# Patient Record
Sex: Female | Born: 1945 | Race: White | Hispanic: No | Marital: Married | Smoking: Former smoker
Health system: Southern US, Community
[De-identification: ages and names within clinical notes are randomized; demographics above are authoritative.]

## PROBLEM LIST (undated history)

## (undated) DIAGNOSIS — E669 Obesity, unspecified: Secondary | ICD-10-CM

## (undated) DIAGNOSIS — M810 Age-related osteoporosis without current pathological fracture: Secondary | ICD-10-CM

## (undated) DIAGNOSIS — J441 Chronic obstructive pulmonary disease with (acute) exacerbation: Secondary | ICD-10-CM

## (undated) DIAGNOSIS — C3490 Malignant neoplasm of unspecified part of unspecified bronchus or lung: Secondary | ICD-10-CM

## (undated) DIAGNOSIS — K219 Gastro-esophageal reflux disease without esophagitis: Secondary | ICD-10-CM

## (undated) DIAGNOSIS — Z87442 Personal history of urinary calculi: Secondary | ICD-10-CM

## (undated) DIAGNOSIS — F32A Depression, unspecified: Secondary | ICD-10-CM

## (undated) DIAGNOSIS — M1990 Unspecified osteoarthritis, unspecified site: Secondary | ICD-10-CM

## (undated) DIAGNOSIS — J189 Pneumonia, unspecified organism: Secondary | ICD-10-CM

## (undated) DIAGNOSIS — E785 Hyperlipidemia, unspecified: Secondary | ICD-10-CM

## (undated) DIAGNOSIS — I10 Essential (primary) hypertension: Secondary | ICD-10-CM

## (undated) DIAGNOSIS — J449 Chronic obstructive pulmonary disease, unspecified: Secondary | ICD-10-CM

## (undated) DIAGNOSIS — F329 Major depressive disorder, single episode, unspecified: Secondary | ICD-10-CM

## (undated) DIAGNOSIS — Z801 Family history of malignant neoplasm of trachea, bronchus and lung: Secondary | ICD-10-CM

## (undated) DIAGNOSIS — Z800 Family history of malignant neoplasm of digestive organs: Secondary | ICD-10-CM

## (undated) DIAGNOSIS — Z923 Personal history of irradiation: Secondary | ICD-10-CM

## (undated) DIAGNOSIS — Z720 Tobacco use: Secondary | ICD-10-CM

## (undated) DIAGNOSIS — Z8051 Family history of malignant neoplasm of kidney: Secondary | ICD-10-CM

## (undated) DIAGNOSIS — F419 Anxiety disorder, unspecified: Secondary | ICD-10-CM

## (undated) DIAGNOSIS — Z803 Family history of malignant neoplasm of breast: Secondary | ICD-10-CM

## (undated) DIAGNOSIS — C50919 Malignant neoplasm of unspecified site of unspecified female breast: Secondary | ICD-10-CM

## (undated) DIAGNOSIS — J439 Emphysema, unspecified: Secondary | ICD-10-CM

## (undated) DIAGNOSIS — G4700 Insomnia, unspecified: Secondary | ICD-10-CM

## (undated) DIAGNOSIS — J45909 Unspecified asthma, uncomplicated: Secondary | ICD-10-CM

## (undated) DIAGNOSIS — G5622 Lesion of ulnar nerve, left upper limb: Secondary | ICD-10-CM

## (undated) HISTORY — DX: Tobacco use: Z72.0

## (undated) HISTORY — DX: Age-related osteoporosis without current pathological fracture: M81.0

## (undated) HISTORY — DX: Emphysema, unspecified: J43.9

## (undated) HISTORY — DX: Gastro-esophageal reflux disease without esophagitis: K21.9

## (undated) HISTORY — DX: Depression, unspecified: F32.A

## (undated) HISTORY — DX: Unspecified osteoarthritis, unspecified site: M19.90

## (undated) HISTORY — DX: Lesion of ulnar nerve, left upper limb: G56.22

## (undated) HISTORY — PX: BREAST SURGERY: SHX581

## (undated) HISTORY — DX: Family history of malignant neoplasm of kidney: Z80.51

## (undated) HISTORY — DX: Major depressive disorder, single episode, unspecified: F32.9

## (undated) HISTORY — DX: Family history of malignant neoplasm of breast: Z80.3

## (undated) HISTORY — DX: Anxiety disorder, unspecified: F41.9

## (undated) HISTORY — DX: Family history of malignant neoplasm of digestive organs: Z80.0

## (undated) HISTORY — DX: Chronic obstructive pulmonary disease, unspecified: J44.9

## (undated) HISTORY — PX: BREAST LUMPECTOMY: SHX2

## (undated) HISTORY — DX: Hyperlipidemia, unspecified: E78.5

## (undated) HISTORY — DX: Essential (primary) hypertension: I10

## (undated) HISTORY — DX: Insomnia, unspecified: G47.00

## (undated) HISTORY — PX: BLADDER SURGERY: SHX569

## (undated) HISTORY — DX: Family history of malignant neoplasm of trachea, bronchus and lung: Z80.1

## (undated) HISTORY — PX: ABDOMINAL HYSTERECTOMY: SHX81

## (undated) HISTORY — PX: CHOLECYSTECTOMY: SHX55

## (undated) HISTORY — DX: Unspecified asthma, uncomplicated: J45.909

## (undated) HISTORY — DX: Malignant neoplasm of unspecified part of unspecified bronchus or lung: C34.90

---

## 2002-02-02 ENCOUNTER — Ambulatory Visit (HOSPITAL_COMMUNITY): Admission: RE | Admit: 2002-02-02 | Discharge: 2002-02-02 | Payer: Self-pay | Admitting: Family Medicine

## 2002-02-02 ENCOUNTER — Encounter: Payer: Self-pay | Admitting: Family Medicine

## 2011-12-31 DIAGNOSIS — Z87440 Personal history of urinary (tract) infections: Secondary | ICD-10-CM | POA: Insufficient documentation

## 2011-12-31 DIAGNOSIS — R319 Hematuria, unspecified: Secondary | ICD-10-CM | POA: Insufficient documentation

## 2011-12-31 DIAGNOSIS — R350 Frequency of micturition: Secondary | ICD-10-CM | POA: Insufficient documentation

## 2011-12-31 DIAGNOSIS — R32 Unspecified urinary incontinence: Secondary | ICD-10-CM | POA: Insufficient documentation

## 2013-07-07 ENCOUNTER — Ambulatory Visit (INDEPENDENT_AMBULATORY_CARE_PROVIDER_SITE_OTHER): Payer: No Typology Code available for payment source | Admitting: Psychology

## 2013-07-07 DIAGNOSIS — F333 Major depressive disorder, recurrent, severe with psychotic symptoms: Secondary | ICD-10-CM

## 2013-07-22 ENCOUNTER — Ambulatory Visit (HOSPITAL_COMMUNITY): Payer: Self-pay | Admitting: Psychology

## 2013-08-24 ENCOUNTER — Encounter (HOSPITAL_COMMUNITY): Payer: Self-pay | Admitting: Psychology

## 2013-08-24 NOTE — Progress Notes (Signed)
Patient:  Laura Abbott   DOB: Oct 30, 1946  MR Number: [id number]  Location: [facility] [address] [place] [address] Dept: [phone]  Start: 3 PM End: 4 PM  Provider/Observer:     Hershal Coria PSYD  Chief Complaint:      Chief Complaint  Patient presents with  . Depression  . Other    Suicidal ideation    Reason For Service:     The patient was referred by her primary care physician because of significant depression and suicidal ideation. The patient describes moderate to significant symptoms of depression, anxiety, mood disturbance, appetite changes, sleep disturbance, loss of interest. She reports her major stressors have to do marital distress. She does report that she has dealt with depression all of her life and remembers being very shy and reserved and depression she was a child. She never remembers being happy. The patient reports that she has had a few depressive events are severe in the past. The patient does report that she is at times of suicidal ideation and has had some recently but does contract for safety. The patient reports that she is in a "loveless marriage". The patient reports a 15 years ago her husband told her that he never loved her. The patient reports that she left her husband but that he has killed these feelings over the years. The patient reports that her husband is telling her that he wants a divorce now that the patient does not only was the house have to start her life over again.   Interventions Strategy:   cognitive/behavioral psychotherapy  Participation Level:   Active  Participation Quality:  Appropriate      Behavioral Observation:  Well Groomed, Alert, and Appropriate.   Current Psychosocial Factors:  the patient reports that there are a lot of stress right now have to do with her husband fact that there is little to no marital relations of any kind  between the 2 of them. The patient reports her husband is telling her he wants a divorce but the thought of this is overwhelming to her.   Content of Session:    review current symptoms and continued work on therapeutic interventions.   Current Status:   the patient reports significant depression and has experienced suicidal ideation in the past but contracts for safety and does understand what to do if she develops significant suicidal ideation.   Patient Progress:    stable   Target Goals:    target goals include building and improving overall coping skills particularly around how to deal with major life events such as what is going through right now between the patient and her husband.   Last Reviewed:    07/07/2013   Goals Addressed Today:     today we worked on Pharmacologist and strategies around her coping abilities.   Impression/Diagnosis:    the patient acknowledges a long history with mild to moderate depressive events. However, more recently she was informed by her husband that he did not love her and has never actually loved her. The patient reports he is told her he wants a divorce which is than a major stressor for her. She reports the development of a significant depressive event.   Diagnosis:    Axis I: Major depressive disorder, recurrent episode, severe, specified as with psychotic behavior

## 2013-09-22 ENCOUNTER — Other Ambulatory Visit: Payer: Self-pay | Admitting: Internal Medicine

## 2013-09-22 DIAGNOSIS — R921 Mammographic calcification found on diagnostic imaging of breast: Secondary | ICD-10-CM

## 2013-09-28 ENCOUNTER — Ambulatory Visit
Admission: RE | Admit: 2013-09-28 | Discharge: 2013-09-28 | Disposition: A | Discharge status: Home / Self Care | Payer: No Typology Code available for payment source | Source: Ambulatory Visit | Attending: Internal Medicine | Admitting: Internal Medicine

## 2013-09-28 DIAGNOSIS — R921 Mammographic calcification found on diagnostic imaging of breast: Secondary | ICD-10-CM

## 2013-10-26 ENCOUNTER — Encounter (INDEPENDENT_AMBULATORY_CARE_PROVIDER_SITE_OTHER): Payer: Self-pay | Admitting: Surgery

## 2013-10-28 ENCOUNTER — Ambulatory Visit (INDEPENDENT_AMBULATORY_CARE_PROVIDER_SITE_OTHER): Payer: Medicare Other | Admitting: Surgery

## 2013-10-28 ENCOUNTER — Ambulatory Visit (INDEPENDENT_AMBULATORY_CARE_PROVIDER_SITE_OTHER): Payer: Self-pay | Admitting: General Surgery

## 2013-10-28 ENCOUNTER — Encounter (INDEPENDENT_AMBULATORY_CARE_PROVIDER_SITE_OTHER): Payer: Self-pay | Admitting: Surgery

## 2013-10-28 VITALS — BP 160/90 | HR 68 | Temp 98.1°F | Resp 14 | Ht 63.5 in | Wt 187.8 lb

## 2013-10-28 DIAGNOSIS — D0590 Unspecified type of carcinoma in situ of unspecified breast: Secondary | ICD-10-CM

## 2013-10-28 DIAGNOSIS — D0510 Intraductal carcinoma in situ of unspecified breast: Secondary | ICD-10-CM | POA: Insufficient documentation

## 2013-10-28 DIAGNOSIS — D0511 Intraductal carcinoma in situ of right breast: Secondary | ICD-10-CM

## 2013-10-28 NOTE — Progress Notes (Signed)
Patient ID: Laura Abbott, female   DOB: 05-Mar-1946, 67 y.o.   MRN: [id number]  Chief Complaint  Patient presents with  . New Evaluation    eval RT br calcification    HPI Laura Abbott is a 67 y.o. female.   HPI This is a pleasant female referred by Dr.Vyas after the recent diagnosis of ductal carcinoma in situ of the right breast. Abnormal calcifications were found in the right breast on recent screening mammography. Biopsy confirmed DCIS. She has had  A previous right breast lumpectomy many years ago for a benign lesion. She has had no other problems regarding her breast.  She denies nipple discharge. She has no other complaints. Past Medical History  Diagnosis Date  . Anxiety   . GERD (gastroesophageal reflux disease)   . Hyperlipidemia   . Depression   . Arthritis   . Asthma   . Cancer     breast  . COPD (chronic obstructive pulmonary disease)   . Emphysema of lung   . Hypertension   . Osteoporosis     Past Surgical History  Procedure Laterality Date  . Cholecystectomy    . Abdominal hysterectomy    . Breast surgery      lumpectomy  . Bladder surgery      had tacked    Family History  Problem Relation Age of Onset  . Cancer Mother     lung  . Cancer Father     pancreatic  . Cancer Sister     leukemia  . Cancer Sister     liver  . Cancer Sister     colon  . Cancer Sister     BREAST    Social History History  Substance Use Topics  . Smoking status: Current Every Day Smoker -- 1.00 packs/day  . Smokeless tobacco: Never Used  . Alcohol Use: No    No Known Allergies  Current Outpatient Prescriptions  Medication Sig Dispense Refill  . albuterol (PROVENTIL HFA;VENTOLIN HFA) 108 (90 BASE) MCG/ACT inhaler Inhale into the lungs every 6 (six) hours as needed for wheezing or shortness of breath.      . ALPRAZolam (XANAX) 0.5 MG tablet Take 0.5 mg by mouth at bedtime as needed for anxiety.      . Calcium 600-200 MG-UNIT per tablet Take 1 tablet by mouth daily.       Marland Kitchen FLUoxetine (PROZAC) 40 MG capsule Take 40 mg by mouth daily.      Marland Kitchen lisinopril (PRINIVIL,ZESTRIL) 10 MG tablet Take 10 mg by mouth daily.      . metoprolol (LOPRESSOR) 50 MG tablet Take 50 mg by mouth 2 (two) times daily.      Marland Kitchen omeprazole (PRILOSEC) 20 MG capsule       . traZODone (DESYREL) 50 MG tablet Take 50 mg by mouth at bedtime.      . VESICARE 5 MG tablet        No current facility-administered medications for this visit.    Review of Systems Review of Systems  Constitutional: Negative for fever, chills and unexpected weight change.  HENT: Negative for congestion, hearing loss, sore throat, trouble swallowing and voice change.   Eyes: Negative for visual disturbance.  Respiratory: Negative for cough and wheezing.   Cardiovascular: Negative for chest pain, palpitations and leg swelling.  Gastrointestinal: Negative for nausea, vomiting, abdominal pain, diarrhea, constipation, blood in stool, abdominal distention and anal bleeding.  Genitourinary: Negative for hematuria, vaginal bleeding and difficulty urinating.  Musculoskeletal:  Negative for arthralgias.  Skin: Negative for rash and wound.  Neurological: Negative for seizures, syncope and headaches.  Hematological: Negative for adenopathy. Does not bruise/bleed easily.  Psychiatric/Behavioral: Negative for confusion.    Blood pressure 160/90, pulse 68, temperature 98.1 F (36.7 C), temperature source Temporal, resp. rate 14, height 5' 3.5" (1.613 m), weight 187 lb 12.8 oz (85.186 kg).  Physical Exam Physical Exam  Constitutional: She is oriented to person, place, and time. She appears well-developed and well-nourished. No distress.  HENT:  Head: Normocephalic and atraumatic.  Right Ear: External ear normal.  Left Ear: External ear normal.  Nose: Nose normal.  Mouth/Throat: Oropharynx is clear and moist. No oropharyngeal exudate.  Eyes: Conjunctivae are normal. Pupils are equal, round, and reactive to light. Right  eye exhibits no discharge. Left eye exhibits no discharge. No scleral icterus.  Neck: Normal range of motion. Neck supple. No tracheal deviation present.  Cardiovascular: Normal rate, regular rhythm, normal heart sounds and intact distal pulses.   No murmur heard. Pulmonary/Chest: Effort normal and breath sounds normal. No respiratory distress. She has no wheezes. She has no rales.  Abdominal: Soft. Bowel sounds are normal. She exhibits no distension. There is no tenderness.  Musculoskeletal: Normal range of motion. She exhibits no edema and no tenderness.  Lymphadenopathy:    She has no cervical adenopathy.    She has no axillary adenopathy.  Neurological: She is alert and oriented to person, place, and time.  Skin: Skin is warm and dry. No rash noted. No erythema.  Psychiatric: Her behavior is normal. Judgment normal.  Breast:  There are no palpable masses in either breast. Areola are normal. There is minimal bruising in the right breast from the previous biopsy  Data Reviewed I have reviewed the patient's mammogram and biopsy results  Assessment    Ductal carcinoma in situ of the right breast     Plan    I discussed the diagnosis with the patient and her daughter in detail. I discussed options including breast conservation versus mastectomy. She would like to proceed with breast conservation. She will thus be scheduled for a needle localized right breast lumpectomy and sentinel lymph node biopsy. I discussed the procedure with her in detail. I discussed the risk of the procedure which include but not limited to bleeding, infection, need for further surgery should the margins or nodes being positive, et Karie Soda. She understands and wished to proceed. Surgery will be scheduled        Jevaughn Degollado A 10/28/2013, 2:10 PM

## 2013-10-29 ENCOUNTER — Other Ambulatory Visit (INDEPENDENT_AMBULATORY_CARE_PROVIDER_SITE_OTHER): Payer: Self-pay | Admitting: Surgery

## 2013-10-29 DIAGNOSIS — D0511 Intraductal carcinoma in situ of right breast: Secondary | ICD-10-CM

## 2013-11-04 ENCOUNTER — Encounter (HOSPITAL_COMMUNITY): Payer: Self-pay

## 2013-11-04 NOTE — Pre-Procedure Instructions (Signed)
KRISTA GODSIL  11/04/2013   Your procedure is scheduled on:  11/09/13  Report to [facility]  2 * 3 at 830 AM.  Call this number if you have problems the morning of surgery: [phone]   Remember:   Do not eat food or drink liquids after midnight.   Take these medicines the morning of surgery with A SIP OF WATER: all,inhalers,prozac,metoprolol,prilosec   Do not wear jewelry, make-up or nail polish.  Do not wear lotions, powders, or perfumes. You may wear deodorant.  Do not shave 48 hours prior to surgery. Men may shave face and neck.  Do not bring valuables to the hospital.  [facility] is not responsible                  for any belongings or valuables.               Contacts, dentures or bridgework may not be worn into surgery.  Leave suitcase in the car. After surgery it may be brought to your room.  For patients admitted to the hospital, discharge time is determined by your                treatment team.               Patients discharged the day of surgery will not be allowed to drive  home.  Name and phone number of your driver: family  Special Instructions: Shower using CHG 2 nights before surgery and the night before surgery.  If you shower the day of surgery use CHG.  Use special wash - you have one bottle of CHG for all showers.  You should use approximately 1/3 of the bottle for each shower.   Please read over the following fact sheets that you were given: Pain Booklet, Coughing and Deep Breathing and Surgical Site Infection Prevention

## 2013-11-05 ENCOUNTER — Encounter (HOSPITAL_COMMUNITY)
Admission: RE | Admit: 2013-11-05 | Discharge: 2013-11-05 | Disposition: A | Discharge status: Home / Self Care | Payer: Medicare Other | Source: Ambulatory Visit | Attending: Surgery | Admitting: Surgery

## 2013-11-05 ENCOUNTER — Encounter (HOSPITAL_COMMUNITY): Payer: Self-pay

## 2013-11-05 VITALS — BP 159/81 | HR 72 | Temp 97.9°F | Resp 18 | Ht 63.0 in | Wt 184.5 lb

## 2013-11-05 DIAGNOSIS — D0511 Intraductal carcinoma in situ of right breast: Secondary | ICD-10-CM

## 2013-11-05 DIAGNOSIS — Z01810 Encounter for preprocedural cardiovascular examination: Secondary | ICD-10-CM | POA: Insufficient documentation

## 2013-11-05 DIAGNOSIS — Z01818 Encounter for other preprocedural examination: Secondary | ICD-10-CM | POA: Insufficient documentation

## 2013-11-05 DIAGNOSIS — Z01812 Encounter for preprocedural laboratory examination: Secondary | ICD-10-CM | POA: Insufficient documentation

## 2013-11-05 LAB — CBC
HCT: 44.8 % (ref 36.0–46.0)
Hemoglobin: 15 g/dL (ref 12.0–15.0)
MCH: 31.2 pg (ref 26.0–34.0)
MCHC: 33.5 g/dL (ref 30.0–36.0)
MCV: 93.1 fL (ref 78.0–100.0)
RDW: 13.3 % (ref 11.5–15.5)

## 2013-11-05 LAB — BASIC METABOLIC PANEL
BUN: 14 mg/dL (ref 6–23)
CO2: 29 mEq/L (ref 19–32)
Creatinine, Ser: 0.52 mg/dL (ref 0.50–1.10)
GFR calc Af Amer: >90 mL/min (ref >90)
GFR calc non Af Amer: >90 mL/min (ref >90)
Glucose, Bld: 105 mg/dL — ABNORMAL HIGH (ref 70–99)
Potassium: 4.1 mEq/L (ref 3.5–5.1)

## 2013-11-08 MED ORDER — CEFAZOLIN SODIUM-DEXTROSE 2-3 GM-% IV SOLR
2.0000 g | INTRAVENOUS | Status: AC
  Administered 2013-11-09: 2 g via INTRAVENOUS
  Filled 2013-11-08: qty 50

## 2013-11-08 NOTE — H&P (Signed)
Chief Complaint   Patient presents with   .  New Evaluation     eval RT br calcification   HPI  Laura Abbott is a 66 y.o. female.  HPI  This is a pleasant female referred by Dr.Vyas after the recent diagnosis of ductal carcinoma in situ of the right breast. Abnormal calcifications were found in the right breast on recent screening mammography. Biopsy confirmed DCIS. She has had A previous right breast lumpectomy many years ago for a benign lesion. She has had no other problems regarding her breast. She denies nipple discharge. She has no other complaints.  Past Medical History   Diagnosis  Date   .  Anxiety    .  GERD (gastroesophageal reflux disease)    .  Hyperlipidemia    .  Depression    .  Arthritis    .  Asthma    .  Cancer      breast   .  COPD (chronic obstructive pulmonary disease)    .  Emphysema of lung    .  Hypertension    .  Osteoporosis     Past Surgical History   Procedure  Laterality  Date   .  Cholecystectomy     .  Abdominal hysterectomy     .  Breast surgery       lumpectomy   .  Bladder surgery       had tacked    Family History   Problem  Relation  Age of Onset   .  Cancer  Mother      lung   .  Cancer  Father      pancreatic   .  Cancer  Sister      leukemia   .  Cancer  Sister      liver   .  Cancer  Sister      colon   .  Cancer  Sister      BREAST   Social History  History   Substance Use Topics   .  Smoking status:  Current Every Day Smoker -- 1.00 packs/day   .  Smokeless tobacco:  Never Used   .  Alcohol Use:  No   No Known Allergies  Current Outpatient Prescriptions   Medication  Sig  Dispense  Refill   .  albuterol (PROVENTIL HFA;VENTOLIN HFA) 108 (90 BASE) MCG/ACT inhaler  Inhale into the lungs every 6 (six) hours as needed for wheezing or shortness of breath.     .  ALPRAZolam (XANAX) 0.5 MG tablet  Take 0.5 mg by mouth at bedtime as needed for anxiety.     .  Calcium 600-200 MG-UNIT per tablet  Take 1 tablet by mouth daily.      Marland Kitchen  FLUoxetine (PROZAC) 40 MG capsule  Take 40 mg by mouth daily.     Marland Kitchen  lisinopril (PRINIVIL,ZESTRIL) 10 MG tablet  Take 10 mg by mouth daily.     .  metoprolol (LOPRESSOR) 50 MG tablet  Take 50 mg by mouth 2 (two) times daily.     Marland Kitchen  omeprazole (PRILOSEC) 20 MG capsule      .  traZODone (DESYREL) 50 MG tablet  Take 50 mg by mouth at bedtime.     .  VESICARE 5 MG tablet       No current facility-administered medications for this visit.   Review of Systems  Review of Systems  Constitutional: Negative for fever, chills and  unexpected weight change.  HENT: Negative for congestion, hearing loss, sore throat, trouble swallowing and voice change.  Eyes: Negative for visual disturbance.  Respiratory: Negative for cough and wheezing.  Cardiovascular: Negative for chest pain, palpitations and leg swelling.  Gastrointestinal: Negative for nausea, vomiting, abdominal pain, diarrhea, constipation, blood in stool, abdominal distention and anal bleeding.  Genitourinary: Negative for hematuria, vaginal bleeding and difficulty urinating.  Musculoskeletal: Negative for arthralgias.  Skin: Negative for rash and wound.  Neurological: Negative for seizures, syncope and headaches.  Hematological: Negative for adenopathy. Does not bruise/bleed easily.  Psychiatric/Behavioral: Negative for confusion.  Blood pressure 160/90, pulse 68, temperature 98.1 F (36.7 C), temperature source Temporal, resp. rate 14, height 5' 3.5" (1.613 m), weight 187 lb 12.8 oz (85.186 kg).  Physical Exam  Physical Exam  Constitutional: She is oriented to person, place, and time. She appears well-developed and well-nourished. No distress.  HENT:  Head: Normocephalic and atraumatic.  Right Ear: External ear normal.  Left Ear: External ear normal.  Nose: Nose normal.  Mouth/Throat: Oropharynx is clear and moist. No oropharyngeal exudate.  Eyes: Conjunctivae are normal. Pupils are equal, round, and reactive to light. Right eye  exhibits no discharge. Left eye exhibits no discharge. No scleral icterus.  Neck: Normal range of motion. Neck supple. No tracheal deviation present.  Cardiovascular: Normal rate, regular rhythm, normal heart sounds and intact distal pulses.  No murmur heard.  Pulmonary/Chest: Effort normal and breath sounds normal. No respiratory distress. She has no wheezes. She has no rales.  Abdominal: Soft. Bowel sounds are normal. She exhibits no distension. There is no tenderness.  Musculoskeletal: Normal range of motion. She exhibits no edema and no tenderness.  Lymphadenopathy:  She has no cervical adenopathy.  She has no axillary adenopathy.  Neurological: She is alert and oriented to person, place, and time.  Skin: Skin is warm and dry. No rash noted. No erythema.  Psychiatric: Her behavior is normal. Judgment normal.  Breast: There are no palpable masses in either breast. Areola are normal. There is minimal bruising in the right breast from the previous biopsy  Data Reviewed  I have reviewed the patient's mammogram and biopsy results  Assessment  Ductal carcinoma in situ of the right breast  Plan  I discussed the diagnosis with the patient and her daughter in detail. I discussed options including breast conservation versus mastectomy. She would like to proceed with breast conservation. She will thus be scheduled for a needle localized right breast lumpectomy and sentinel lymph node biopsy. I discussed the procedure with her in detail. I discussed the risk of the procedure which include but not limited to bleeding, infection, need for further surgery should the margins or nodes being positive, et Karie Soda. She understands and wished to proceed. Surgery will be scheduled

## 2013-11-09 ENCOUNTER — Encounter (HOSPITAL_COMMUNITY): Payer: Medicare Other | Admitting: Certified Registered"

## 2013-11-09 ENCOUNTER — Ambulatory Visit (HOSPITAL_COMMUNITY)
Admission: RE | Admit: 2013-11-09 | Discharge: 2013-11-09 | Disposition: A | Discharge status: Home / Self Care | Payer: Medicare Other | Source: Ambulatory Visit | Attending: Surgery | Admitting: Surgery

## 2013-11-09 ENCOUNTER — Encounter (HOSPITAL_COMMUNITY): Payer: Self-pay | Admitting: Certified Registered"

## 2013-11-09 ENCOUNTER — Ambulatory Visit (HOSPITAL_COMMUNITY): Payer: Medicare Other | Admitting: Certified Registered"

## 2013-11-09 ENCOUNTER — Ambulatory Visit
Admission: RE | Admit: 2013-11-09 | Discharge: 2013-11-09 | Disposition: A | Discharge status: Home / Self Care | Payer: Medicare Other | Source: Ambulatory Visit | Attending: Surgery | Admitting: Surgery

## 2013-11-09 ENCOUNTER — Encounter (HOSPITAL_COMMUNITY)
Admission: RE | Disposition: A | Discharge status: Home / Self Care | Payer: Self-pay | Source: Ambulatory Visit | Attending: Surgery

## 2013-11-09 ENCOUNTER — Encounter (HOSPITAL_COMMUNITY)
Admission: RE | Admit: 2013-11-09 | Discharge: 2013-11-09 | Disposition: A | Discharge status: Home / Self Care | Payer: Medicare Other | Source: Ambulatory Visit | Attending: Surgery | Admitting: Surgery

## 2013-11-09 DIAGNOSIS — C50919 Malignant neoplasm of unspecified site of unspecified female breast: Secondary | ICD-10-CM | POA: Insufficient documentation

## 2013-11-09 DIAGNOSIS — D0511 Intraductal carcinoma in situ of right breast: Secondary | ICD-10-CM

## 2013-11-09 DIAGNOSIS — J4489 Other specified chronic obstructive pulmonary disease: Secondary | ICD-10-CM | POA: Insufficient documentation

## 2013-11-09 DIAGNOSIS — F411 Generalized anxiety disorder: Secondary | ICD-10-CM | POA: Insufficient documentation

## 2013-11-09 DIAGNOSIS — F17200 Nicotine dependence, unspecified, uncomplicated: Secondary | ICD-10-CM | POA: Insufficient documentation

## 2013-11-09 DIAGNOSIS — I10 Essential (primary) hypertension: Secondary | ICD-10-CM | POA: Insufficient documentation

## 2013-11-09 DIAGNOSIS — J449 Chronic obstructive pulmonary disease, unspecified: Secondary | ICD-10-CM | POA: Insufficient documentation

## 2013-11-09 DIAGNOSIS — D0590 Unspecified type of carcinoma in situ of unspecified breast: Secondary | ICD-10-CM | POA: Insufficient documentation

## 2013-11-09 DIAGNOSIS — F3289 Other specified depressive episodes: Secondary | ICD-10-CM | POA: Insufficient documentation

## 2013-11-09 DIAGNOSIS — K219 Gastro-esophageal reflux disease without esophagitis: Secondary | ICD-10-CM | POA: Insufficient documentation

## 2013-11-09 DIAGNOSIS — F329 Major depressive disorder, single episode, unspecified: Secondary | ICD-10-CM | POA: Insufficient documentation

## 2013-11-09 DIAGNOSIS — Z79899 Other long term (current) drug therapy: Secondary | ICD-10-CM | POA: Insufficient documentation

## 2013-11-09 HISTORY — PX: BREAST LUMPECTOMY WITH NEEDLE LOCALIZATION AND AXILLARY SENTINEL LYMPH NODE BX: SHX5760

## 2013-11-09 SURGERY — BREAST LUMPECTOMY WITH NEEDLE LOCALIZATION AND AXILLARY SENTINEL LYMPH NODE BX
Anesthesia: General | Site: Breast | Laterality: Right

## 2013-11-09 MED ORDER — SODIUM CHLORIDE 0.9 % IJ SOLN: 3.0000 mL | Freq: Two times a day (BID) | INTRAMUSCULAR | Status: DC

## 2013-11-09 MED ORDER — TECHNETIUM TC 99M SULFUR COLLOID FILTERED
1.0000 | Freq: Once | INTRAVENOUS | Status: AC | PRN
  Administered 2013-11-09: 1 via INTRADERMAL

## 2013-11-09 MED ORDER — ONDANSETRON HCL 4 MG/2ML IJ SOLN: 4.0000 mg | Freq: Four times a day (QID) | INTRAMUSCULAR | Status: DC | PRN

## 2013-11-09 MED ORDER — ARTIFICIAL TEARS OP OINT
TOPICAL_OINTMENT | OPHTHALMIC | Status: DC | PRN
  Administered 2013-11-09: 1 via OPHTHALMIC

## 2013-11-09 MED ORDER — METHYLENE BLUE 1 % INJ SOLN
INTRAMUSCULAR | Status: AC
  Filled 2013-11-09: qty 10

## 2013-11-09 MED ORDER — ACETAMINOPHEN 650 MG RE SUPP: 650.0000 mg | RECTAL | Status: DC | PRN

## 2013-11-09 MED ORDER — FENTANYL CITRATE 0.05 MG/ML IJ SOLN
INTRAMUSCULAR | Status: AC
  Filled 2013-11-09: qty 2

## 2013-11-09 MED ORDER — ONDANSETRON HCL 4 MG/2ML IJ SOLN: 4.0000 mg | Freq: Once | INTRAMUSCULAR | Status: DC | PRN

## 2013-11-09 MED ORDER — OXYCODONE HCL 5 MG PO TABS: 5.0000 mg | ORAL_TABLET | ORAL | Status: DC | PRN

## 2013-11-09 MED ORDER — LACTATED RINGERS IV SOLN
INTRAVENOUS | Status: DC | PRN
  Administered 2013-11-09: 12:00:00 via INTRAVENOUS

## 2013-11-09 MED ORDER — LIDOCAINE HCL (CARDIAC) 20 MG/ML IV SOLN
INTRAVENOUS | Status: DC | PRN
  Administered 2013-11-09: 80 mg via INTRAVENOUS

## 2013-11-09 MED ORDER — BUPIVACAINE-EPINEPHRINE (PF) 0.25% -1:200000 IJ SOLN
INTRAMUSCULAR | Status: AC
  Filled 2013-11-09: qty 30

## 2013-11-09 MED ORDER — MORPHINE SULFATE 4 MG/ML IJ SOLN: 4.0000 mg | INTRAMUSCULAR | Status: DC | PRN

## 2013-11-09 MED ORDER — FENTANYL CITRATE 0.05 MG/ML IJ SOLN
100.0000 ug | Freq: Once | INTRAMUSCULAR | Status: AC
  Administered 2013-11-09: 100 ug via INTRAVENOUS

## 2013-11-09 MED ORDER — SODIUM CHLORIDE 0.9 % IV SOLN: 250.0000 mL | INTRAVENOUS | Status: DC | PRN

## 2013-11-09 MED ORDER — OXYCODONE-ACETAMINOPHEN 5-325 MG PO TABS: 1.0000 | ORAL_TABLET | ORAL | Status: DC | PRN

## 2013-11-09 MED ORDER — 0.9 % SODIUM CHLORIDE (POUR BTL) OPTIME
TOPICAL | Status: DC | PRN
  Administered 2013-11-09: 1000 mL

## 2013-11-09 MED ORDER — HYDROMORPHONE HCL PF 1 MG/ML IJ SOLN
0.2500 mg | INTRAMUSCULAR | Status: DC | PRN
  Administered 2013-11-09: 0.25 mg via INTRAVENOUS

## 2013-11-09 MED ORDER — ONDANSETRON HCL 4 MG/2ML IJ SOLN
INTRAMUSCULAR | Status: DC | PRN
  Administered 2013-11-09: 4 mg via INTRAVENOUS

## 2013-11-09 MED ORDER — ACETAMINOPHEN 325 MG PO TABS: 650.0000 mg | ORAL_TABLET | ORAL | Status: DC | PRN

## 2013-11-09 MED ORDER — SODIUM CHLORIDE 0.9 % IJ SOLN
INTRAMUSCULAR | Status: DC | PRN
  Administered 2013-11-09: 11:00:00 via INTRAMUSCULAR

## 2013-11-09 MED ORDER — PROPOFOL 10 MG/ML IV BOLUS
INTRAVENOUS | Status: DC | PRN
  Administered 2013-11-09: 200 mg via INTRAVENOUS

## 2013-11-09 MED ORDER — BUPIVACAINE-EPINEPHRINE 0.25% -1:200000 IJ SOLN
INTRAMUSCULAR | Status: DC | PRN
  Administered 2013-11-09: 20 mL

## 2013-11-09 MED ORDER — SODIUM CHLORIDE 0.9 % IJ SOLN: 3.0000 mL | INTRAMUSCULAR | Status: DC | PRN

## 2013-11-09 MED ORDER — SODIUM CHLORIDE 0.9 % IJ SOLN
INTRAMUSCULAR | Status: AC
  Filled 2013-11-09: qty 10

## 2013-11-09 MED ORDER — LACTATED RINGERS IV SOLN
INTRAVENOUS | Status: DC
  Administered 2013-11-09: 10:00:00 via INTRAVENOUS

## 2013-11-09 MED ORDER — GLYCOPYRROLATE 0.2 MG/ML IJ SOLN
INTRAMUSCULAR | Status: DC | PRN
  Administered 2013-11-09 (×2): 0.1 mg via INTRAVENOUS

## 2013-11-09 MED ORDER — HYDROMORPHONE HCL PF 1 MG/ML IJ SOLN
INTRAMUSCULAR | Status: AC
  Administered 2013-11-09: 0.25 mg via INTRAVENOUS
  Filled 2013-11-09: qty 1

## 2013-11-09 MED ORDER — PHENYLEPHRINE HCL 10 MG/ML IJ SOLN
INTRAMUSCULAR | Status: DC | PRN
  Administered 2013-11-09: 40 ug via INTRAVENOUS

## 2013-11-09 MED ORDER — FENTANYL CITRATE 0.05 MG/ML IJ SOLN
INTRAMUSCULAR | Status: DC | PRN
  Administered 2013-11-09: 75 ug via INTRAVENOUS
  Administered 2013-11-09: 25 ug via INTRAVENOUS

## 2013-11-09 SURGICAL SUPPLY — 43 items
APPLIER CLIP 9.375 MED OPEN (MISCELLANEOUS)
BENZOIN TINCTURE PRP APPL 2/3 (GAUZE/BANDAGES/DRESSINGS) ×2 IMPLANT
BINDER BREAST LRG (GAUZE/BANDAGES/DRESSINGS) IMPLANT
BINDER BREAST XLRG (GAUZE/BANDAGES/DRESSINGS) IMPLANT
BLADE SURG 15 STRL LF DISP TIS (BLADE) ×1 IMPLANT
BLADE SURG 15 STRL SS (BLADE) ×1
CANISTER SUCTION 2500CC (MISCELLANEOUS) ×2 IMPLANT
CHLORAPREP W/TINT 26ML (MISCELLANEOUS) ×2 IMPLANT
CLIP APPLIE 9.375 MED OPEN (MISCELLANEOUS) IMPLANT
CLSR STERI-STRIP ANTIMIC 1/2X4 (GAUZE/BANDAGES/DRESSINGS) ×2 IMPLANT
CONT SPEC 4OZ CLIKSEAL STRL BL (MISCELLANEOUS) ×4 IMPLANT
COVER PROBE W GEL 5X96 (DRAPES) ×2 IMPLANT
COVER SURGICAL LIGHT HANDLE (MISCELLANEOUS) ×2 IMPLANT
DEVICE DUBIN SPECIMEN MAMMOGRA (MISCELLANEOUS) IMPLANT
DRAPE LAPAROSCOPIC ABDOMINAL (DRAPES) ×2 IMPLANT
DRSG TEGADERM 4X4.75 (GAUZE/BANDAGES/DRESSINGS) ×2 IMPLANT
ELECT CAUTERY BLADE 6.4 (BLADE) ×2 IMPLANT
ELECT REM PT RETURN 9FT ADLT (ELECTROSURGICAL) ×2
ELECTRODE REM PT RTRN 9FT ADLT (ELECTROSURGICAL) ×1 IMPLANT
GLOVE SURG SIGNA 7.5 PF LTX (GLOVE) ×2 IMPLANT
GOWN STRL NON-REIN LRG LVL3 (GOWN DISPOSABLE) ×2 IMPLANT
GOWN STRL REIN XL XLG (GOWN DISPOSABLE) ×2 IMPLANT
KIT BASIN OR (CUSTOM PROCEDURE TRAY) ×2 IMPLANT
KIT MARKER MARGIN INK (KITS) ×2 IMPLANT
KIT ROOM TURNOVER OR (KITS) ×2 IMPLANT
NEEDLE 18GX1X1/2 (RX/OR ONLY) (NEEDLE) ×2 IMPLANT
NEEDLE HYPO 25GX1X1/2 BEV (NEEDLE) ×4 IMPLANT
NS IRRIG 1000ML POUR BTL (IV SOLUTION) ×2 IMPLANT
PACK SURGICAL SETUP 50X90 (CUSTOM PROCEDURE TRAY) ×2 IMPLANT
PAD ARMBOARD 7.5X6 YLW CONV (MISCELLANEOUS) ×2 IMPLANT
PENCIL BUTTON HOLSTER BLD 10FT (ELECTRODE) ×2 IMPLANT
SPONGE GAUZE 4X4 12PLY (GAUZE/BANDAGES/DRESSINGS) ×2 IMPLANT
SPONGE LAP 4X18 X RAY DECT (DISPOSABLE) ×4 IMPLANT
STRIP CLOSURE SKIN 1/2X4 (GAUZE/BANDAGES/DRESSINGS) ×2 IMPLANT
SUT MON AB 4-0 PC3 18 (SUTURE) ×2 IMPLANT
SUT VIC AB 3-0 SH 27 (SUTURE) ×1
SUT VIC AB 3-0 SH 27XBRD (SUTURE) ×1 IMPLANT
SYR BULB 3OZ (MISCELLANEOUS) ×2 IMPLANT
SYR CONTROL 10ML LL (SYRINGE) ×4 IMPLANT
TOWEL OR 17X24 6PK STRL BLUE (TOWEL DISPOSABLE) ×2 IMPLANT
TOWEL OR 17X26 10 PK STRL BLUE (TOWEL DISPOSABLE) ×2 IMPLANT
TUBE CONNECTING 12X1/4 (SUCTIONS) ×2 IMPLANT
YANKAUER SUCT BULB TIP NO VENT (SUCTIONS) ×2 IMPLANT

## 2013-11-09 NOTE — Anesthesia Procedure Notes (Signed)
Procedure Name: LMA Insertion Date/Time: 11/09/2013 11:47 AM Performed by: Lanell Matar Pre-anesthesia Checklist: Patient identified, Timeout performed, Emergency Drugs available, Suction available and Patient being monitored Patient Re-evaluated:Patient Re-evaluated prior to inductionOxygen Delivery Method: Circle system utilized Preoxygenation: Pre-oxygenation with 100% oxygen Intubation Type: IV induction Ventilation: Mask ventilation without difficulty LMA: LMA inserted LMA Size: 4.0 Placement Confirmation: positive ETCO2,  CO2 detector and breath sounds checked- equal and bilateral Tube secured with: Tape Dental Injury: Teeth and Oropharynx as per pre-operative assessment

## 2013-11-09 NOTE — Anesthesia Postprocedure Evaluation (Signed)
Anesthesia Post Note  Patient: Laura Abbott  Procedure(s) Performed: Procedure(s) (LRB): BREAST LUMPECTOMY WITH NEEDLE LOCALIZATION AND AXILLARY SENTINEL LYMPH NODE BIOPSY (Right)  Anesthesia type: General  Patient location: PACU  Post pain: Pain level controlled  Post assessment: Patient's Cardiovascular Status Stable  Last Vitals:  Filed Vitals:   11/09/13 1300  BP:   Pulse: 76  Temp:   Resp: 15    Post vital signs: Reviewed and stable  Level of consciousness: alert  Complications: No apparent anesthesia complications

## 2013-11-09 NOTE — Transfer of Care (Signed)
Immediate Anesthesia Transfer of Care Note  Patient: Laura Abbott  Procedure(s) Performed: Procedure(s): BREAST LUMPECTOMY WITH NEEDLE LOCALIZATION AND AXILLARY SENTINEL LYMPH NODE BIOPSY (Right)  Patient Location: PACU  Anesthesia Type:General  Level of Consciousness: awake, alert  and oriented  Airway & Oxygen Therapy: Patient Spontanous Breathing and Patient connected to nasal cannula oxygen  Post-op Assessment: Report given to PACU RN, Post -op Vital signs reviewed and stable and Patient moving all extremities X 4  Post vital signs: Reviewed and stable  Complications: No apparent anesthesia complications

## 2013-11-09 NOTE — Progress Notes (Signed)
sentinel node procedure completed, pt. tolerated procedure well.

## 2013-11-09 NOTE — Anesthesia Preprocedure Evaluation (Addendum)
Anesthesia Evaluation  Patient identified by MRN, date of birth, ID band Patient awake    Reviewed: Allergy & Precautions, H&P , NPO status , Patient's Chart, lab work & pertinent test results, reviewed documented beta blocker date and time   Airway Mallampati: II TM Distance: >3 FB Neck ROM: Full    Dental  (+) Edentulous Upper, Missing and Dental Advisory Given   Pulmonary asthma , COPDCurrent Smoker,          Cardiovascular hypertension,     Neuro/Psych PSYCHIATRIC DISORDERS Anxiety Depression    GI/Hepatic GERD-  ,  Endo/Other    Renal/GU      Musculoskeletal   Abdominal   Peds  Hematology   Anesthesia Other Findings   Reproductive/Obstetrics                          Anesthesia Physical Anesthesia Plan  ASA: III  Anesthesia Plan: General   Post-op Pain Management:    Induction: Intravenous  Airway Management Planned: Oral ETT  Additional Equipment:   Intra-op Plan:   Post-operative Plan: Extubation in OR  Informed Consent: I have reviewed the patients History and Physical, chart, labs and discussed the procedure including the risks, benefits and alternatives for the proposed anesthesia with the patient or authorized representative who has indicated his/her understanding and acceptance.     Plan Discussed with:   Anesthesia Plan Comments:         Anesthesia Quick Evaluation

## 2013-11-09 NOTE — Interval H&P Note (Signed)
History and Physical Interval Note:no change in H and P  11/09/2013 11:12 AM  Laura Abbott  has presented today for surgery, with the diagnosis of breast DCIS  The various methods of treatment have been discussed with the patient and family. After consideration of risks, benefits and other options for treatment, the patient has consented to  Procedure(s): BREAST LUMPECTOMY WITH NEEDLE LOCALIZATION AND AXILLARY SENTINEL LYMPH NODE BX (Right) as a surgical intervention .  The patient's history has been reviewed, patient examined, no change in status, stable for surgery.  I have reviewed the patient's chart and labs.  Questions were answered to the patient's satisfaction.     Abhimanyu Cruces A

## 2013-11-09 NOTE — Op Note (Signed)
BREAST LUMPECTOMY WITH NEEDLE LOCALIZATION AND AXILLARY SENTINEL LYMPH NODE BIOPSY  Procedure Note  AMPARO DONALSON 11/09/2013   Pre-op Diagnosis: Right breast DCIS     Post-op Diagnosis: same   Procedure(s): RIGHT BREAST LUMPECTOMY WITH NEEDLE LOCALIZATION AND AXILLARY SENTINEL LYMPH NODE BIOPSY INJECTION OF BLUE DYE  Surgeon(s): Shelly Rubenstein, MD  Anesthesia: General  Staff:  Circulator: Arvil Persons, RN Relief Circulator: Lina Sayre, RN Relief Scrub: Megan Day Cavanaugh, RN Scrub Person: Janeece Agee Pingue, CST  Estimated Blood Loss: Minimal               Specimens: sent to path          [facility] A   Date: 11/09/2013  Time: 12:38 PM

## 2013-11-10 ENCOUNTER — Encounter (HOSPITAL_COMMUNITY): Payer: Self-pay | Admitting: Surgery

## 2013-11-10 NOTE — Op Note (Signed)
Laura Abbott, MCCLAIN                  ACCOUNT NO.:  [id number]  MEDICAL RECORD NO.:  [id number]  LOCATION:  [facility]                          FACILITY:  [facility]  PHYSICIAN:  Abigail Miyamoto, M.D. DATE OF BIRTH:  02-04-1946  DATE OF PROCEDURE:  11/09/2013 DATE OF DISCHARGE:                              OPERATIVE REPORT   PREOPERATIVE DIAGNOSIS:  Right breast ductal carcinoma in situ.  POSTOPERATIVE DIAGNOSIS:  Right breast ductal carcinoma in situ.  PROCEDURE: 1. Right breast needle localized partial mastectomy with right     axillary sentinel lymph node biopsy. 2. Injection of blue dye.  SURGEON:  Abigail Miyamoto, M.D.  ANESTHESIA:  General and 0.5% Marcaine.  ESTIMATED BLOOD LOSS:  Minimal.  INDICATIONS:  This is a 67 year old female who presented with abnormal calcifications in the right breast.  Stereotactic biopsy confirmed ductal carcinoma in situ.  She now presents for a partial mastectomy and sentinel node biopsy.  FINDINGS:  Two sentinel lymph nodes were identified in the axilla.  PROCEDURE IN DETAIL:  The patient was identified in the holding area. She had already had a localization wire placed in the right breast. Radioactive isotope was then injected by the radiation technologist into the right breast.  The patient was then taken to the operating room and placed supine on the operating table and general anesthesia was induced. I then injected blue dye underneath the areola of the right breast.  The breast was thoroughly massaged.  Her right breast and axilla were then prepped and draped in usual sterile fashion.  I made elliptical incision in the skin around the localization wire with a scalpel.  I took this down to the breast tissue with electrocautery.  I then performed a wide partial mastectomy of the upper outer quadrant going all the way down to the chest wall.  Wide margins appeared to be achieved around localization wire.  The specimen was then completely  removed.  An x-ray was performed and confirmed the suspicious area was in the lumpectomy specimen.  Hemostasis was then achieved with the cautery.  I then brought the Neoprobe onto the field.  I identified an area of increased uptake in the right axilla.  I then made a small incision with a scalpel.  I took this down to the axillary tissue.  I then was able to identify 2 sentinel lymph nodes, which were removed with the electrocautery.  Hemostasis then appeared to be achieved with cautery. I anesthetized all wounds with Marcaine.  I achieved hemostasis with cautery.  I then closed both incisions with subcuticular 3-0 Vicryl sutures and ran the skin with running 4-0 Monocryl.  Steri-Strips, gauze, and Tegaderm were then applied.  The patient tolerated the procedure well.  All the counts were correct at the end of the procedure.  The patient was then extubated in the operating room and taken in stable condition to recovery room.     Abigail Miyamoto, M.D.     DB/MEDQ  D:  11/09/2013  T:  11/10/2013  Job:  [id number]

## 2013-11-24 ENCOUNTER — Encounter: Payer: Self-pay | Admitting: *Deleted

## 2013-11-24 ENCOUNTER — Ambulatory Visit (INDEPENDENT_AMBULATORY_CARE_PROVIDER_SITE_OTHER): Payer: Medicare Other | Admitting: Surgery

## 2013-11-24 ENCOUNTER — Other Ambulatory Visit (INDEPENDENT_AMBULATORY_CARE_PROVIDER_SITE_OTHER): Payer: Self-pay | Admitting: Surgery

## 2013-11-24 ENCOUNTER — Encounter (INDEPENDENT_AMBULATORY_CARE_PROVIDER_SITE_OTHER): Payer: Self-pay | Admitting: Surgery

## 2013-11-24 VITALS — BP 160/82 | HR 80 | Temp 98.7°F | Resp 14 | Ht 63.0 in | Wt 186.0 lb

## 2013-11-24 DIAGNOSIS — Z09 Encounter for follow-up examination after completed treatment for conditions other than malignant neoplasm: Secondary | ICD-10-CM

## 2013-11-24 DIAGNOSIS — C50911 Malignant neoplasm of unspecified site of right female breast: Secondary | ICD-10-CM

## 2013-11-24 NOTE — Progress Notes (Signed)
Received referral in EPIC from [facility].  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 Bertis Ruddy.  I emailed Annie at [facility] to make aware.  Took paperwork to Tiff in Med Rec to schedule.

## 2013-11-24 NOTE — Progress Notes (Signed)
Subjective:     Patient ID: Laura Abbott, female   DOB: Mar 27, 1946, 67 y.o.   MRN: [id number]  HPI She is here for her first postop visit status post right breast lumpectomy and sentinel biopsy. She is doing well and has no complaints.  Review of Systems     Objective:   Physical Exam On exam, her incisions are well healed.  The final pathology showed DCIS with negative margins and negative sentinel lymph nodes. She is strongly ER and PR positive    Assessment:     Patient stable postop     Plan:     We will now refer her to medical and radiation oncology for further care. I explained the pathology report to her in detail I gave her a copy of the report. I will see her back here in 3 months

## 2013-11-25 ENCOUNTER — Telehealth: Payer: Self-pay | Admitting: Hematology and Oncology

## 2013-11-25 NOTE — Telephone Encounter (Signed)
C/D 11/25/13 for appt. 12/01/13

## 2013-11-25 NOTE — Telephone Encounter (Signed)
S/W PT AND GVE NP APPT 01/06  @ 10:30 W/DR. GORSUCH  REFERRING DR. Abigail Miyamoto DX- BREAST CA. RIGHT BREAST  WELCOME PACKET MAILED.

## 2013-12-01 ENCOUNTER — Telehealth: Payer: Self-pay | Admitting: Hematology and Oncology

## 2013-12-01 ENCOUNTER — Encounter: Payer: Self-pay | Admitting: Hematology and Oncology

## 2013-12-01 ENCOUNTER — Ambulatory Visit: Payer: Medicare Other

## 2013-12-01 ENCOUNTER — Ambulatory Visit (HOSPITAL_BASED_OUTPATIENT_CLINIC_OR_DEPARTMENT_OTHER): Payer: Medicare Other | Admitting: Hematology and Oncology

## 2013-12-01 VITALS — BP 152/58 | HR 64 | Temp 97.9°F | Resp 18 | Ht 63.0 in | Wt 184.8 lb

## 2013-12-01 DIAGNOSIS — F17200 Nicotine dependence, unspecified, uncomplicated: Secondary | ICD-10-CM

## 2013-12-01 DIAGNOSIS — M810 Age-related osteoporosis without current pathological fracture: Secondary | ICD-10-CM

## 2013-12-01 DIAGNOSIS — Z809 Family history of malignant neoplasm, unspecified: Secondary | ICD-10-CM

## 2013-12-01 DIAGNOSIS — D0511 Intraductal carcinoma in situ of right breast: Secondary | ICD-10-CM

## 2013-12-01 DIAGNOSIS — D0590 Unspecified type of carcinoma in situ of unspecified breast: Secondary | ICD-10-CM

## 2013-12-01 DIAGNOSIS — Z720 Tobacco use: Secondary | ICD-10-CM

## 2013-12-01 HISTORY — DX: Tobacco use: Z72.0

## 2013-12-01 HISTORY — DX: Age-related osteoporosis without current pathological fracture: M81.0

## 2013-12-01 NOTE — Progress Notes (Signed)
[place] CONSULT NOTE  Patient Care Team: Glenda Chroman, MD as PCP - General (Internal Medicine)  CHIEF COMPLAINTS/PURPOSE OF CONSULTATION:  Right breast DCIS, status post lumpectomy, ER/PR positive  HISTORY OF PRESENTING ILLNESS:  Laura Abbott 68 y.o. female is here because of recent diagnosis of right breast DCIS  The cancer was detected by screening mammogram. The cancer was not palpable prior to diagnosis  I reviewed her records extensively and collaborated the history with the patient.  SUMMARY OF ONCOLOGIC HISTORY: Patient began screening mammogram in her 100s. Several years ago, she had biopsies from the right breast that came back negative for malignancy. Recently, screening mammogram detected abnormalities on the right breast. On 09/28/2013, she had biopsy done that came back grade 1-2 DCIS, 100% he surgery receptor positive and 94% progesterone receptor positive. She was subsequently recommended surgery. On 11/09/2013, Dr. Ninfa Linden performed right breast lumpectomy with sentinel lymph node biopsy. According to the report, high grade DCIS was found and the tumor was estimated at 1.9 cm in size with negative margin. None of the sentinel lymph nodes were involved. She recovers well from surgery with no nature complications such as infection. She is to have mild intermittent pain at the incision site of which she take pain medication periodically.  In terms of breast cancer risk profile:  She menarched at early age of 31 and went to menopause at age 29  She had 3 pregnancies, her first child was born at age 44  She did not breast-fed her children She received birth control pills for approximately 3 years.  She was never exposed to fertility medications or hormone replacement therapy.  She has family history of Breast cancer involving her sister. One other sister had colon cancer and her father had pancreatic cancer  INTERVAL HISTORY: Currently, she is healing  well after surgery apart from mild discomfort. She has no concern for local infection  MEDICAL HISTORY:  Past Medical History  Diagnosis Date  . Anxiety   . GERD (gastroesophageal reflux disease)   . Hyperlipidemia   . Depression   . Arthritis   . Asthma   . Cancer     breast  . COPD (chronic obstructive pulmonary disease)   . Emphysema of lung   . Hypertension   . Osteoporosis     SURGICAL HISTORY: Past Surgical History  Procedure Laterality Date  . Cholecystectomy    . Abdominal hysterectomy    . Breast surgery      lumpectomy  . Bladder surgery      had tacked  . Breast lumpectomy with needle localization and axillary sentinel lymph node bx Right 11/09/2013    Procedure: BREAST LUMPECTOMY WITH NEEDLE LOCALIZATION AND AXILLARY SENTINEL LYMPH NODE BIOPSY;  Surgeon: Harl Bowie, MD;  Location: [facility];  Service: General;  Laterality: Right;    SOCIAL HISTORY: History   Social History  . Marital Status: Married    Spouse Name: N/A    Number of Children: N/A  . Years of Education: N/A   Occupational History  . Not on file.   Social History Main Topics  . Smoking status: Current Every Day Smoker -- 1.00 packs/day for 35 years  . Smokeless tobacco: Never Used  . Alcohol Use: No  . Drug Use: No  . Sexual Activity: Not on file   Other Topics Concern  . Not on file   Social History Narrative  . No narrative on file    FAMILY HISTORY:  Family History  Problem Relation Age of Onset  . Cancer Mother     lung  . Cancer Father     pancreatic  . Cancer Sister     leukemia  . Cancer Sister     liver  . Cancer Sister     colon  . Cancer Sister     BREAST    ALLERGIES:  has No Known Allergies.  MEDICATIONS:  Current Outpatient Prescriptions  Medication Sig Dispense Refill  . albuterol (PROVENTIL HFA;VENTOLIN HFA) 108 (90 BASE) MCG/ACT inhaler Inhale into the lungs every 6 (six) hours as needed for wheezing or shortness of breath.      . ALPRAZolam  (XANAX) 0.5 MG tablet Take 0.5 mg by mouth at bedtime as needed for anxiety.      . Calcium 600-200 MG-UNIT per tablet Take 1 tablet by mouth daily.      Marland Kitchen FLUoxetine (PROZAC) 40 MG capsule Take 40 mg by mouth daily.      Marland Kitchen lisinopril (PRINIVIL,ZESTRIL) 10 MG tablet Take 10 mg by mouth daily.      . metoprolol (LOPRESSOR) 50 MG tablet Take 50-75 mg by mouth 2 (two) times daily. Takes 75 mg in the morning and 50 mg in the evening      . omeprazole (PRILOSEC) 20 MG capsule Take 20 mg by mouth 2 (two) times daily.       Marland Kitchen oxyCODONE-acetaminophen (ROXICET) 5-325 MG per tablet Take 1-2 tablets by mouth every 4 (four) hours as needed for severe pain.  40 tablet  0  . traZODone (DESYREL) 50 MG tablet Take 50 mg by mouth at bedtime.      . VESICARE 5 MG tablet Take 5 mg by mouth daily.        No current facility-administered medications for this visit.    REVIEW OF SYSTEMS:   Constitutional: Denies fevers, chills or abnormal night sweats Eyes: Denies blurriness of vision, double vision or watery eyes Ears, nose, mouth, throat, and face: Denies mucositis or sore throat Respiratory: Denies cough, dyspnea or wheezes Cardiovascular: Denies palpitation, chest discomfort or lower extremity swelling Gastrointestinal:  Denies nausea, heartburn or change in bowel habits Skin: Denies abnormal skin rashes Lymphatics: Denies new lymphadenopathy or easy bruising Neurological:Denies numbness, tingling or new weaknesses Behavioral/Psych: Mood is stable, no new changes  All other systems were reviewed with the patient and are negative.  PHYSICAL EXAMINATION: ECOG PERFORMANCE STATUS: 0 - Asymptomatic  Filed Vitals:   12/01/13 1109  BP: 152/58  Pulse: 64  Temp: 97.9 F (36.6 C)  Resp: 18   Filed Weights   12/01/13 1109  Weight: 184 lb 12.8 oz (83.825 kg)    GENERAL:alert, no distress and comfortable SKIN: skin color, texture, turgor are normal, no rashes or significant lesions EYES: normal,  conjunctiva are pink and non-injected, sclera clear OROPHARYNX:no exudate, no erythema and lips, buccal mucosa, and tongue normal  NECK: supple, thyroid normal size, non-tender, without nodularity LYMPH:  no palpable lymphadenopathy in the cervical, axillary or inguinal LUNGS: clear to auscultation and percussion with normal breathing effort HEART: regular rate & rhythm and no murmurs and no lower extremity edema ABDOMEN:abdomen soft, non-tender and normal bowel sounds Musculoskeletal:no cyanosis of digits and no clubbing  PSYCH: alert & oriented x 3 with fluent speech NEURO: no focal motor/sensory deficits Normal breast exam on the left. The right breast well-healed surgical scar with very mild hematoma underneath the incision site LABORATORY DATA:  I have reviewed the data as listed  Lab Results  Component Value Date   WBC 11.3* 11/05/2013   HGB 15.0 11/05/2013   HCT 44.8 11/05/2013   MCV 93.1 11/05/2013   PLT 293 11/05/2013   Lab Results  Component Value Date   NA 142 11/05/2013   K 4.1 11/05/2013   CL 102 11/05/2013   CO2 29 11/05/2013   A review of her surgical report and pathology report ASSESSMENT:  right breast DCIS STATUS post lumpectomy, ER/PR positive  PLAN:  #1 right breast DCIS The patient had early stage disease. She is considered cured of disease.  Any form of adjuvant treatment is for prevention of disease recurrence.  She is currently undergoing assessment and treatment for adjuvant radiation therapy.  I plan to see her back in approximately 6 weeks for further discussion about the role of adjuvant endocrine therapy, in view that her disease stained positive for estrogen receptor.  My plan would be to start her on adjuvant tamoxifen after she recovers from radiation treatment  #2 history of osteoporosis The patient is scheduled to have a bone density scan done. I recommend she has the bone density scan done before her next visit. In the meantime I recommend she  increase her vitamin D supplement #3 tobacco use I spent some time educating the patient about the importance of nicotine cessation. The patient is attempting to quit on her own. #4 strong family history of cancer I reviewed the NCCN guidelines with her. At present time, the patient appeared to fulfill the criteria are for further genetic risk evaluation. I recommend consideration for genetic counseling but the patient wants to think about it first. All questions were answered. The patient knows to call the clinic with any problems, questions or concerns.    [place], [place], MD 12/01/2013 12:02 PM

## 2013-12-01 NOTE — Telephone Encounter (Signed)
appts made per 1/6 POF AVS and CAL given shh °

## 2013-12-01 NOTE — Progress Notes (Signed)
Checked in new pt with no financial concerns. °

## 2013-12-04 ENCOUNTER — Encounter: Payer: Self-pay | Admitting: Radiation Oncology

## 2013-12-04 NOTE — Progress Notes (Signed)
Location of Breast Cancer:right breast DCIS  Histology per Pathology Report: 1. Breast, lumpectomy, Right- DUCTAL CARCINOMA IN SITU, 1.9 CM.- MARGINS NOT INVOLVED.- CLOSEST MARGIN ANTERIOR AT 0.5 CM2. Lymph node, sentinel, biopsy, Right #1- ONE BENIGN LYMPH NODE (0/1).3. Lymph node, sentinel, biopsy, Right #2- ONE BENIGN LYMPH NODE (0/1) Dr. Coralie Keens  Receptor Status: ER(100% positive), PR (94% positive), Her2-neu (not done)  Did patient present with symptoms (if so, please note symptoms) or was this found on screening mammography?: screening mammography  Past/Anticipated interventions by surgeon, if any: 11/09/13 Procedure: BREAST LUMPECTOMY WITH NEEDLE LOCALIZATION AND AXILLARY SENTINEL LYMPH NODE BIOPSY;  Surgeon: Harl Bowie, MD;  Location: [facility];  Service: General;  Laterality: Right;  Past/Anticipated interventions by medical oncology, if any: possible adjuvant tamoxifen per Dr. Alvy Bimler  Lymphedema issues, no Pain issues, if any: right breast tenderness and soreness   SAFETY ISSUES:  Prior radiation? no  Pacemaker/ICD? no  Possible current pregnancy?}  Is the patient on methotrexate? no  Current Complaints / other details:  Menarche at age 21, menopause at age 9, has had 3 children with her first child born at 60.  Used BCP for 3 years. Mother lung cancer, father pancreatic cancer, 1 sister leukemia, 2nd sister liver ca, 3rd sister=colon ca, (4th sister breast ca living age 35, surgery and radiation dx 5 years ago) other family members deceased

## 2013-12-07 ENCOUNTER — Ambulatory Visit
Admission: RE | Admit: 2013-12-07 | Discharge: 2013-12-07 | Disposition: A | Discharge status: Home / Self Care | Payer: Medicare Other | Source: Ambulatory Visit | Attending: Radiation Oncology | Admitting: Radiation Oncology

## 2013-12-07 ENCOUNTER — Encounter: Payer: Self-pay | Admitting: Radiation Oncology

## 2013-12-07 VITALS — BP 147/73 | HR 65 | Temp 97.9°F | Resp 20 | Ht 63.5 in | Wt 187.6 lb

## 2013-12-07 DIAGNOSIS — D0511 Intraductal carcinoma in situ of right breast: Secondary | ICD-10-CM

## 2013-12-07 NOTE — Progress Notes (Signed)
   Department of Radiation Oncology  Phone:  [phone] Fax:        [phone]  Progress note  Today I briefly met with the patient concerning her intraductal carcinoma of the right breast. Patient lives close to [facility] and wishes to have her radiation treatment at the [facility] in [place]. She has been set up for consultation with Dr. Gatha Mayer on January 14 at 8 AM. The patient will take a  copy of her chart to her consultation appointment.  -----------------------------------  Blair Promise, PhD, MD

## 2014-01-03 ENCOUNTER — Encounter (HOSPITAL_COMMUNITY): Payer: Self-pay | Admitting: Emergency Medicine

## 2014-01-03 ENCOUNTER — Emergency Department (HOSPITAL_COMMUNITY): Payer: Medicare Other

## 2014-01-03 ENCOUNTER — Inpatient Hospital Stay (HOSPITAL_COMMUNITY)
Admission: EM | Admit: 2014-01-03 | Discharge: 2014-01-05 | DRG: 190 | Disposition: A | Discharge status: Home / Self Care | Payer: Medicare Other | Attending: Internal Medicine | Admitting: Internal Medicine

## 2014-01-03 DIAGNOSIS — J9601 Acute respiratory failure with hypoxia: Secondary | ICD-10-CM

## 2014-01-03 DIAGNOSIS — J209 Acute bronchitis, unspecified: Secondary | ICD-10-CM | POA: Diagnosis present

## 2014-01-03 DIAGNOSIS — J40 Bronchitis, not specified as acute or chronic: Secondary | ICD-10-CM

## 2014-01-03 DIAGNOSIS — J9600 Acute respiratory failure, unspecified whether with hypoxia or hypercapnia: Secondary | ICD-10-CM

## 2014-01-03 DIAGNOSIS — M129 Arthropathy, unspecified: Secondary | ICD-10-CM | POA: Diagnosis present

## 2014-01-03 DIAGNOSIS — D0510 Intraductal carcinoma in situ of unspecified breast: Secondary | ICD-10-CM

## 2014-01-03 DIAGNOSIS — T380X5A Adverse effect of glucocorticoids and synthetic analogues, initial encounter: Secondary | ICD-10-CM | POA: Diagnosis present

## 2014-01-03 DIAGNOSIS — Z806 Family history of leukemia: Secondary | ICD-10-CM

## 2014-01-03 DIAGNOSIS — M810 Age-related osteoporosis without current pathological fracture: Secondary | ICD-10-CM

## 2014-01-03 DIAGNOSIS — R0902 Hypoxemia: Secondary | ICD-10-CM

## 2014-01-03 DIAGNOSIS — D0590 Unspecified type of carcinoma in situ of unspecified breast: Secondary | ICD-10-CM | POA: Diagnosis present

## 2014-01-03 DIAGNOSIS — J441 Chronic obstructive pulmonary disease with (acute) exacerbation: Principal | ICD-10-CM

## 2014-01-03 DIAGNOSIS — J440 Chronic obstructive pulmonary disease with acute lower respiratory infection: Secondary | ICD-10-CM | POA: Diagnosis present

## 2014-01-03 DIAGNOSIS — J45901 Unspecified asthma with (acute) exacerbation: Principal | ICD-10-CM

## 2014-01-03 DIAGNOSIS — R7309 Other abnormal glucose: Secondary | ICD-10-CM

## 2014-01-03 DIAGNOSIS — F411 Generalized anxiety disorder: Secondary | ICD-10-CM | POA: Diagnosis present

## 2014-01-03 DIAGNOSIS — F329 Major depressive disorder, single episode, unspecified: Secondary | ICD-10-CM | POA: Diagnosis present

## 2014-01-03 DIAGNOSIS — Z720 Tobacco use: Secondary | ICD-10-CM

## 2014-01-03 DIAGNOSIS — Z923 Personal history of irradiation: Secondary | ICD-10-CM

## 2014-01-03 DIAGNOSIS — Z6832 Body mass index (BMI) 32.0-32.9, adult: Secondary | ICD-10-CM

## 2014-01-03 DIAGNOSIS — E785 Hyperlipidemia, unspecified: Secondary | ICD-10-CM | POA: Diagnosis present

## 2014-01-03 DIAGNOSIS — R739 Hyperglycemia, unspecified: Secondary | ICD-10-CM

## 2014-01-03 DIAGNOSIS — E669 Obesity, unspecified: Secondary | ICD-10-CM

## 2014-01-03 DIAGNOSIS — K219 Gastro-esophageal reflux disease without esophagitis: Secondary | ICD-10-CM | POA: Diagnosis present

## 2014-01-03 DIAGNOSIS — F3289 Other specified depressive episodes: Secondary | ICD-10-CM | POA: Diagnosis present

## 2014-01-03 DIAGNOSIS — I10 Essential (primary) hypertension: Secondary | ICD-10-CM

## 2014-01-03 DIAGNOSIS — F17200 Nicotine dependence, unspecified, uncomplicated: Secondary | ICD-10-CM

## 2014-01-03 HISTORY — DX: Chronic obstructive pulmonary disease with (acute) exacerbation: J44.1

## 2014-01-03 HISTORY — DX: Malignant neoplasm of unspecified site of unspecified female breast: C50.919

## 2014-01-03 HISTORY — DX: Obesity, unspecified: E66.9

## 2014-01-03 LAB — CBC WITH DIFFERENTIAL/PLATELET
Basophils Absolute: 0 10*3/uL (ref 0.0–0.1)
Basophils Relative: 0 % (ref 0–1)
Eosinophils Absolute: 0 10*3/uL (ref 0.0–0.7)
Eosinophils Relative: 0 % (ref 0–5)
HEMATOCRIT: 42.5 % (ref 36.0–46.0)
Hemoglobin: 14.2 g/dL (ref 12.0–15.0)
LYMPHS PCT: 3 % — AB (ref 12–46)
Lymphs Abs: 0.4 10*3/uL — ABNORMAL LOW (ref 0.7–4.0)
MCH: 31.4 pg (ref 26.0–34.0)
MCHC: 33.4 g/dL (ref 30.0–36.0)
MCV: 94 fL (ref 78.0–100.0)
MONO ABS: 1.1 10*3/uL — AB (ref 0.1–1.0)
MONOS PCT: 7 % (ref 3–12)
Neutro Abs: 12.8 10*3/uL — ABNORMAL HIGH (ref 1.7–7.7)
Neutrophils Relative %: 90 % — ABNORMAL HIGH (ref 43–77)
Platelets: 292 10*3/uL (ref 150–400)
RBC: 4.52 MIL/uL (ref 3.87–5.11)
RDW: 13.6 % (ref 11.5–15.5)
WBC: 14.3 10*3/uL — AB (ref 4.0–10.5)

## 2014-01-03 LAB — COMPREHENSIVE METABOLIC PANEL
ALT: 12 U/L (ref 0–35)
AST: 16 U/L (ref 0–37)
Albumin: 3.7 g/dL (ref 3.5–5.2)
Alkaline Phosphatase: 88 U/L (ref 39–117)
BUN: 9 mg/dL (ref 6–23)
CO2: 25 meq/L (ref 19–32)
Calcium: 9.3 mg/dL (ref 8.4–10.5)
Chloride: 96 mEq/L (ref 96–112)
Creatinine, Ser: 0.55 mg/dL (ref 0.50–1.10)
GLUCOSE: 128 mg/dL — AB (ref 70–99)
Potassium: 3.9 mEq/L (ref 3.7–5.3)
Sodium: 136 mEq/L — ABNORMAL LOW (ref 137–147)
Total Bilirubin: 0.7 mg/dL (ref 0.3–1.2)
Total Protein: 7.9 g/dL (ref 6.0–8.3)

## 2014-01-03 LAB — GLUCOSE, CAPILLARY: Glucose-Capillary: 151 mg/dL — ABNORMAL HIGH (ref 70–99)

## 2014-01-03 LAB — TROPONIN I

## 2014-01-03 LAB — PRO B NATRIURETIC PEPTIDE: Pro B Natriuretic peptide (BNP): 287.4 pg/mL — ABNORMAL HIGH (ref 0–125)

## 2014-01-03 LAB — INFLUENZA PANEL BY PCR (TYPE A & B)
H1N1 flu by pcr: NOT DETECTED
Influenza A By PCR: NEGATIVE
Influenza B By PCR: NEGATIVE

## 2014-01-03 MED ORDER — GUAIFENESIN-DM 100-10 MG/5ML PO SYRP: 5.0000 mL | ORAL_SOLUTION | ORAL | Status: DC | PRN

## 2014-01-03 MED ORDER — METOPROLOL TARTRATE 25 MG PO TABS: 25.0000 mg | ORAL_TABLET | Freq: Two times a day (BID) | ORAL | Status: DC

## 2014-01-03 MED ORDER — IPRATROPIUM BROMIDE 0.02 % IN SOLN
0.5000 mg | RESPIRATORY_TRACT | Status: DC
Start: 2014-01-03 — End: 2014-01-05
  Administered 2014-01-03 – 2014-01-04 (×8): 0.5 mg via RESPIRATORY_TRACT
  Filled 2014-01-03 (×8): qty 2.5

## 2014-01-03 MED ORDER — IOHEXOL 350 MG/ML SOLN
100.0000 mL | Freq: Once | INTRAVENOUS | Status: AC | PRN
Start: 2014-01-03 — End: 2014-01-03
  Administered 2014-01-03: 100 mL via INTRAVENOUS

## 2014-01-03 MED ORDER — ONDANSETRON HCL 4 MG PO TABS: 4.0000 mg | ORAL_TABLET | Freq: Four times a day (QID) | ORAL | Status: DC | PRN

## 2014-01-03 MED ORDER — INSULIN ASPART 100 UNIT/ML ~~LOC~~ SOLN
0.0000 [IU] | Freq: Three times a day (TID) | SUBCUTANEOUS | Status: DC
  Administered 2014-01-04 (×2): 4 [IU] via SUBCUTANEOUS
  Administered 2014-01-04 – 2014-01-05 (×2): 3 [IU] via SUBCUTANEOUS
  Administered 2014-01-05: 4 [IU] via SUBCUTANEOUS

## 2014-01-03 MED ORDER — ALPRAZOLAM 0.5 MG PO TABS
0.5000 mg | ORAL_TABLET | Freq: Every evening | ORAL | Status: DC | PRN
  Administered 2014-01-03 – 2014-01-04 (×2): 0.5 mg via ORAL
  Filled 2014-01-03 (×2): qty 1

## 2014-01-03 MED ORDER — ACETAMINOPHEN 325 MG PO TABS
650.0000 mg | ORAL_TABLET | Freq: Once | ORAL | Status: AC
  Administered 2014-01-03: 650 mg via ORAL
  Filled 2014-01-03: qty 2

## 2014-01-03 MED ORDER — METOPROLOL TARTRATE 25 MG PO TABS
25.0000 mg | ORAL_TABLET | Freq: Two times a day (BID) | ORAL | Status: DC
  Administered 2014-01-03 – 2014-01-05 (×4): 25 mg via ORAL
  Filled 2014-01-03 (×4): qty 1

## 2014-01-03 MED ORDER — ENOXAPARIN SODIUM 40 MG/0.4ML ~~LOC~~ SOLN: 40.0000 mg | SUBCUTANEOUS | Status: DC

## 2014-01-03 MED ORDER — ACETAMINOPHEN 325 MG PO TABS: 650.0000 mg | ORAL_TABLET | Freq: Four times a day (QID) | ORAL | Status: DC | PRN

## 2014-01-03 MED ORDER — LEVOFLOXACIN IN D5W 750 MG/150ML IV SOLN
750.0000 mg | INTRAVENOUS | Status: DC
  Administered 2014-01-04: 750 mg via INTRAVENOUS
  Filled 2014-01-03 (×2): qty 150

## 2014-01-03 MED ORDER — LEVALBUTEROL HCL 0.63 MG/3ML IN NEBU
0.6300 mg | INHALATION_SOLUTION | RESPIRATORY_TRACT | Status: DC
Start: 2014-01-03 — End: 2014-01-05
  Administered 2014-01-03 – 2014-01-04 (×8): 0.63 mg via RESPIRATORY_TRACT
  Filled 2014-01-03 (×8): qty 3

## 2014-01-03 MED ORDER — SODIUM CHLORIDE 0.9 % IV SOLN
INTRAVENOUS | Status: DC
  Administered 2014-01-03: 18:00:00 via INTRAVENOUS

## 2014-01-03 MED ORDER — ACETAMINOPHEN 650 MG RE SUPP: 650.0000 mg | Freq: Four times a day (QID) | RECTAL | Status: DC | PRN

## 2014-01-03 MED ORDER — METHYLPREDNISOLONE SODIUM SUCC 125 MG IJ SOLR
60.0000 mg | Freq: Four times a day (QID) | INTRAMUSCULAR | Status: DC
  Administered 2014-01-03 – 2014-01-05 (×7): 60 mg via INTRAVENOUS
  Filled 2014-01-03 (×7): qty 2

## 2014-01-03 MED ORDER — FLUOXETINE HCL 20 MG PO CAPS
40.0000 mg | ORAL_CAPSULE | Freq: Every day | ORAL | Status: DC
  Administered 2014-01-04 – 2014-01-05 (×2): 40 mg via ORAL
  Filled 2014-01-03 (×4): qty 2

## 2014-01-03 MED ORDER — LEVOFLOXACIN IN D5W 500 MG/100ML IV SOLN
500.0000 mg | Freq: Once | INTRAVENOUS | Status: AC
  Administered 2014-01-03: 500 mg via INTRAVENOUS
  Filled 2014-01-03: qty 100

## 2014-01-03 MED ORDER — ALBUTEROL SULFATE (2.5 MG/3ML) 0.083% IN NEBU: 2.5000 mg | INHALATION_SOLUTION | RESPIRATORY_TRACT | Status: DC

## 2014-01-03 MED ORDER — CALCIUM CARBONATE-VITAMIN D 500-200 MG-UNIT PO TABS
1.0000 | ORAL_TABLET | Freq: Every day | ORAL | Status: DC
  Administered 2014-01-04 – 2014-01-05 (×2): 1 via ORAL
  Filled 2014-01-03 (×4): qty 1

## 2014-01-03 MED ORDER — DARIFENACIN HYDROBROMIDE ER 7.5 MG PO TB24
7.5000 mg | ORAL_TABLET | Freq: Every day | ORAL | Status: DC
  Administered 2014-01-04 – 2014-01-05 (×2): 7.5 mg via ORAL
  Filled 2014-01-03 (×3): qty 1

## 2014-01-03 MED ORDER — TRAZODONE HCL 50 MG PO TABS
50.0000 mg | ORAL_TABLET | Freq: Every day | ORAL | Status: DC
  Administered 2014-01-03 – 2014-01-04 (×2): 50 mg via ORAL
  Filled 2014-01-03 (×2): qty 1

## 2014-01-03 MED ORDER — INSULIN ASPART 100 UNIT/ML ~~LOC~~ SOLN: 0.0000 [IU] | Freq: Every day | SUBCUTANEOUS | Status: DC

## 2014-01-03 MED ORDER — ONDANSETRON HCL 4 MG/2ML IJ SOLN
4.0000 mg | Freq: Four times a day (QID) | INTRAMUSCULAR | Status: DC | PRN
  Administered 2014-01-03: 4 mg via INTRAVENOUS
  Filled 2014-01-03: qty 2

## 2014-01-03 MED ORDER — ENOXAPARIN SODIUM 40 MG/0.4ML ~~LOC~~ SOLN
40.0000 mg | SUBCUTANEOUS | Status: DC
  Administered 2014-01-03 – 2014-01-04 (×2): 40 mg via SUBCUTANEOUS
  Filled 2014-01-03 (×2): qty 0.4

## 2014-01-03 MED ORDER — DOCUSATE SODIUM 100 MG PO CAPS
100.0000 mg | ORAL_CAPSULE | Freq: Two times a day (BID) | ORAL | Status: DC
  Administered 2014-01-03 – 2014-01-05 (×4): 100 mg via ORAL
  Filled 2014-01-03 (×8): qty 1

## 2014-01-03 MED ORDER — IPRATROPIUM-ALBUTEROL 0.5-2.5 (3) MG/3ML IN SOLN
3.0000 mL | Freq: Once | RESPIRATORY_TRACT | Status: AC
  Administered 2014-01-03: 3 mL via RESPIRATORY_TRACT
  Filled 2014-01-03: qty 3

## 2014-01-03 MED ORDER — ALBUTEROL SULFATE (2.5 MG/3ML) 0.083% IN NEBU: 2.5000 mg | INHALATION_SOLUTION | RESPIRATORY_TRACT | Status: DC | PRN

## 2014-01-03 MED ORDER — LISINOPRIL 10 MG PO TABS: 10.0000 mg | ORAL_TABLET | Freq: Every day | ORAL | Status: DC

## 2014-01-03 MED ORDER — PANTOPRAZOLE SODIUM 40 MG PO TBEC
40.0000 mg | DELAYED_RELEASE_TABLET | Freq: Every day | ORAL | Status: DC
  Administered 2014-01-04 – 2014-01-05 (×2): 40 mg via ORAL
  Filled 2014-01-03 (×3): qty 1

## 2014-01-03 MED ORDER — METHYLPREDNISOLONE SODIUM SUCC 125 MG IJ SOLR
125.0000 mg | Freq: Once | INTRAMUSCULAR | Status: AC
  Administered 2014-01-03: 125 mg via INTRAVENOUS
  Filled 2014-01-03: qty 2

## 2014-01-03 MED ORDER — OXYCODONE-ACETAMINOPHEN 5-325 MG PO TABS
1.0000 | ORAL_TABLET | ORAL | Status: DC | PRN
  Administered 2014-01-03 – 2014-01-04 (×2): 2 via ORAL
  Filled 2014-01-03 (×2): qty 2

## 2014-01-03 MED ORDER — POTASSIUM CHLORIDE IN NACL 20-0.9 MEQ/L-% IV SOLN
INTRAVENOUS | Status: AC
  Administered 2014-01-03 – 2014-01-04 (×2): via INTRAVENOUS

## 2014-01-03 MED ORDER — BENZONATATE 100 MG PO CAPS
100.0000 mg | ORAL_CAPSULE | Freq: Three times a day (TID) | ORAL | Status: DC
  Administered 2014-01-03 – 2014-01-05 (×4): 100 mg via ORAL
  Filled 2014-01-03 (×4): qty 1

## 2014-01-03 MED ORDER — LISINOPRIL 10 MG PO TABS
10.0000 mg | ORAL_TABLET | Freq: Every day | ORAL | Status: DC
  Administered 2014-01-04 – 2014-01-05 (×2): 10 mg via ORAL
  Filled 2014-01-03 (×2): qty 1

## 2014-01-03 NOTE — ED Provider Notes (Signed)
CSN: [id number]     Arrival date & time 01/03/14  1448 History   First MD Initiated Contact with Patient 01/03/14 1459     Chief Complaint  Patient presents with  . Shortness of Breath   (Consider location/radiation/quality/duration/timing/severity/associated sxs/prior Treatment) HPI Comments: Patient presents with ongoing shortness of breath since this morning. She felt well when she went to bed last night. She endorses a cough productive of yellow and green mucus. She's felt warm at home but didn't check her temperature. She denies any runny nose or sore throat. She denies any chest pain, leg pain or leg swelling. Denies abdominal pain, nausea vomiting. She denies any sick contacts. She's a history of breast cancer and is currently undergoing radiation. She denies any previous history of blood clots. She did get a flu shot. She is a history of COPD but denies any heart problems. Her breathing is better with sitting up and worse with lying down.  The history is provided by the patient.    Past Medical History  Diagnosis Date  . Anxiety   . GERD (gastroesophageal reflux disease)   . Hyperlipidemia   . Depression   . Arthritis   . Asthma   . Cancer     breast, right DCIS  . COPD (chronic obstructive pulmonary disease)   . Emphysema of lung   . Hypertension   . Osteoporosis   . Osteoporosis, unspecified 12/01/2013  . Tobacco abuse 12/01/2013   Past Surgical History  Procedure Laterality Date  . Cholecystectomy    . Abdominal hysterectomy    . Breast surgery      lumpectomy  . Bladder surgery      had tacked  . Breast lumpectomy with needle localization and axillary sentinel lymph node bx Right 11/09/2013    Procedure: BREAST LUMPECTOMY WITH NEEDLE LOCALIZATION AND AXILLARY SENTINEL LYMPH NODE BIOPSY;  Surgeon: Harl Bowie, MD;  Location: [facility];  Service: General;  Laterality: Right;   Family History  Problem Relation Age of Onset  . Cancer Mother     lung  . Cancer Father      pancreatic  . Cancer Sister     leukemia  . Cancer Sister     liver  . Cancer Sister     colon  . Cancer Sister     BREAST   History  Substance Use Topics  . Smoking status: Current Every Day Smoker -- 1.00 packs/day for 35 years  . Smokeless tobacco: Never Used  . Alcohol Use: No   OB History   Grav Para Term Preterm Abortions TAB SAB Ect Mult Living                 Review of Systems  Constitutional: Positive for fever and chills. Negative for fatigue.  HENT: Positive for congestion. Negative for rhinorrhea.   Respiratory: Positive for cough and shortness of breath. Negative for chest tightness.   Cardiovascular: Negative for chest pain and leg swelling.  Gastrointestinal: Negative for nausea, vomiting and abdominal pain.  Genitourinary: Negative for dysuria, hematuria, vaginal bleeding and vaginal discharge.  Musculoskeletal: Negative for arthralgias and myalgias.  Skin: Negative for rash.  Neurological: Positive for weakness. Negative for dizziness and headaches.  A complete 10 system review of systems was obtained and all systems are negative except as noted in the HPI and PMH.    Allergies  Review of patient's allergies indicates no known allergies.  Home Medications   No current outpatient prescriptions on file. BP  150/63  Pulse 92  Temp(Src) 98.4 F (36.9 C) (Oral)  Resp 16  Ht 5\' 3"  (1.6 m)  Wt 183 lb 13.8 oz (83.4 kg)  BMI 32.58 kg/m2  SpO2 90% Physical Exam  Constitutional: She is oriented to person, place, and time. She appears well-developed and well-nourished. No distress.  HENT:  Head: Normocephalic and atraumatic.  Mouth/Throat: Oropharynx is clear and moist.  Eyes: Conjunctivae and EOM are normal. Pupils are equal, round, and reactive to light.  Neck: Normal range of motion. Neck supple.  Cardiovascular: Normal rate and normal heart sounds.   No murmur heard. Tachycardic  Pulmonary/Chest: Effort normal. She has wheezes.  Scattered  expiratory wheezing  Abdominal: Soft. There is no tenderness. There is no rebound and no guarding.  Musculoskeletal: Normal range of motion. She exhibits no edema and no tenderness.  Neurological: She is alert and oriented to person, place, and time. No cranial nerve deficit. She exhibits normal muscle tone. Coordination normal.  Skin: Skin is warm.    ED Course  Procedures (including critical care time) Labs Review Labs Reviewed  CBC WITH DIFFERENTIAL - Abnormal; Notable for the following:    WBC 14.3 (*)    Neutrophils Relative % 90 (*)    Neutro Abs 12.8 (*)    Lymphocytes Relative 3 (*)    Lymphs Abs 0.4 (*)    Monocytes Absolute 1.1 (*)    All other components within normal limits  COMPREHENSIVE METABOLIC PANEL - Abnormal; Notable for the following:    Sodium 136 (*)    Glucose, Bld 128 (*)    All other components within normal limits  PRO B NATRIURETIC PEPTIDE - Abnormal; Notable for the following:    Pro B Natriuretic peptide (BNP) 287.4 (*)    All other components within normal limits  GLUCOSE, CAPILLARY - Abnormal; Notable for the following:    Glucose-Capillary 151 (*)    All other components within normal limits  TROPONIN I  INFLUENZA PANEL BY PCR (TYPE A & B, H1N1)  TSH  BASIC METABOLIC PANEL  CBC   Imaging Review Dg Chest 2 View  01/03/2014   CLINICAL DATA:  Shortness of breath and fever  EXAM: CHEST  2 VIEW  COMPARISON:  November 05, 2013  FINDINGS: Lungs are clear. Heart size and pulmonary vascularity are within normal limits. No adenopathy. There is atherosclerotic change in the aortic arch region. There is degenerative change in the thoracic spine.  IMPRESSION: No edema or consolidation.   Electronically Signed   By: Lowella Grip M.D.   On: 01/03/2014 16:07   Ct Angio Chest Pe W/cm &/or Wo Cm  01/03/2014   CLINICAL DATA:  Shortness of breath. History of right-sided breast cancer with radiation therapy.  EXAM: CT ANGIOGRAPHY CHEST WITH CONTRAST  TECHNIQUE:  Multidetector CT imaging of the chest was performed using the standard protocol during bolus administration of intravenous contrast. Multiplanar CT image reconstructions including MIPs were obtained to evaluate the vascular anatomy.  CONTRAST:  152mL OMNIPAQUE IOHEXOL 350 MG/ML SOLN  COMPARISON:  DG CHEST 2 VIEW dated 01/03/2014; DG CHEST 2 VIEW dated 11/05/2013; CT CHEST W/ CM dated 01/10/2007; CT SIM BREAST dated 12/14/2013  FINDINGS: The pulmonary arteries are well opacified with contrast. There is no evidence of acute pulmonary embolism.  There is mild atherosclerosis of the aorta, great vessels and coronary arteries. The heart size is normal. There is no pleural or pericardial effusion. There are no enlarged mediastinal, hilar or axillary lymph nodes. Asymmetric  density laterally in the right breast has decreased in size from the simulation study of 3 weeks ago and is likely a postoperative fluid collection.  There is mild scarring at the right apex and left lung base. No confluent airspace opacity or suspicious pulmonary nodule is demonstrated.  The visualized upper abdomen is unremarkable. There are no worrisome osseous findings.  Review of the MIP images confirms the above findings.  IMPRESSION: No evidence of acute pulmonary embolism or other acute chest process. Improving postoperative fluid collection in the right breast.   Electronically Signed   By: Camie Patience M.D.   On: 01/03/2014 16:51      MDM   1. Bronchitis   2. Hypoxia   3. Acute respiratory failure with hypoxia   4. COPD exacerbation   5. Hyperglycemia   6. Hypertension   7. Tobacco abuse    Difficulty breathing with cough since last night. Patient is febrile, tachycardic and hypoxic on arrival. Hx breast cancer, getting radiation.  She started on oxygen, nebulizers, steroids, obtain chest x-ray and EKG.  Chest x-ray is negative for infiltrate. Patient feels better with oxygen and after now. EKG is unchanged. Troponin is  negative.  Given her clear x-ray, hypoxia and tachycardia history of cancer, CT angiogram was performed.  No evidence of PE or pneumonia. Suspect bronchitis a source of fever and hypoxia and shortness of breath. She started on IV Levaquin. She is hypoxic with ambulation and will need admission for further treatment.   Date: 01/04/2014  Rate: 108  Rhythm: sinus tachycardia  QRS Axis: normal  Intervals: normal  ST/T Wave abnormalities: nonspecific ST/T changes  Conduction Disutrbances:none  Narrative Interpretation:   Old EKG Reviewed: unchanged    Ezequiel Essex, MD 01/04/14 [phone]

## 2014-01-03 NOTE — ED Notes (Signed)
Pt states she feels like she is breathing better.

## 2014-01-03 NOTE — ED Notes (Addendum)
Pt c/o sob, cough that is productive with yellow sputum production that started this am,  Denies any vomiting, denies any pain, admits to nausea. Pt is currently receiving radiation for breast cancer,

## 2014-01-03 NOTE — H&P (Signed)
[facility] History and Physical  Laura Abbott TDD:[id number] DOB: 06/15/1946 DOA: 01/03/2014  Referring physician: EDP, Dr. Wyvonnia Dusky PCP: Glenda Chroman., MD   Chief Complaint: Shortness of breath  HPI: Laura Abbott is a 68 y.o. female with a history of COPD, tobacco abuse, hypertension, and right-sided breast cancer, who presents to the emergency department with a chief complaint of shortness of breath. Her shortness of breath started this morning. It occurred at rest and with activity. She denies orthopnea. She had a cough which was mostly nonproductive. She had a fever of 100.3 at home. She denies chills. She denies pleurisy, chest pain, nausea, vomiting, abdominal pain, diarrhea, or unusual swelling in her legs. She had to use her inhaler 3 times which was unusual for her. She smokes a half a pack to a pack of cigarettes per day, but was only able to smoke 2 cigarettes today.  In the emergency department, she is febrile with a temperature of 101.2. She is mildly tachycardic, but her blood pressure is within normal limits. Her oxygen saturation decreased to 86% following ambulation. CT angiogram of her chest revealed no evidence of pulmonary embolism or acute chest process. Her lab data are significant for a serum sodium of 136, glucose of 128, normal troponin I., WBC of 14.3, proBNP of 287, and negative influenza panel. She is being admitted for further evaluation and management.     Review of Systems:  As above in history present illness, otherwise negative.    Past Medical History  Diagnosis Date  . Anxiety   . GERD (gastroesophageal reflux disease)   . Hyperlipidemia   . Depression   . Arthritis   . Asthma   . Cancer     breast, right DCIS  . COPD (chronic obstructive pulmonary disease)   . Emphysema of lung   . Hypertension   . Osteoporosis   . Osteoporosis, unspecified 12/01/2013  . Tobacco abuse 12/01/2013   Past Surgical History  Procedure Laterality Date  .  Cholecystectomy    . Abdominal hysterectomy    . Breast surgery      lumpectomy  . Bladder surgery      had tacked  . Breast lumpectomy with needle localization and axillary sentinel lymph node bx Right 11/09/2013    Procedure: BREAST LUMPECTOMY WITH NEEDLE LOCALIZATION AND AXILLARY SENTINEL LYMPH NODE BIOPSY;  Surgeon: Harl Bowie, MD;  Location: [facility];  Service: General;  Laterality: Right;   Social History: She is married. She has 3 children. She works part-time. She has been smoking for many years. She is now cut down to half a pack to one pack of cigarettes per day. She denies illicit drug use or alcohol use.    No Known Allergies  Family History  Problem Relation Age of Onset  . Cancer Mother     lung  . Cancer Father     pancreatic  . Cancer Sister     leukemia  . Cancer Sister     liver  . Cancer Sister     colon  . Cancer Sister     BREAST     Prior to Admission medications   Medication Sig Start Date End Date Taking? Authorizing Provider  albuterol (PROVENTIL HFA;VENTOLIN HFA) 108 (90 BASE) MCG/ACT inhaler Inhale into the lungs every 6 (six) hours as needed for wheezing or shortness of breath.   Yes Historical Provider, MD  ALPRAZolam Duanne Moron) 0.5 MG tablet Take 0.5 mg by mouth at bedtime as needed  for anxiety.   Yes Historical Provider, MD  Calcium 600-200 MG-UNIT per tablet Take 1 tablet by mouth daily.   Yes Historical Provider, MD  docusate sodium (COLACE) 100 MG capsule Take 100 mg by mouth daily as needed for mild constipation.   Yes Historical Provider, MD  FLUoxetine (PROZAC) 40 MG capsule Take 40 mg by mouth daily.   Yes Historical Provider, MD  lisinopril (PRINIVIL,ZESTRIL) 10 MG tablet Take 10 mg by mouth daily.   Yes Historical Provider, MD  metoprolol (LOPRESSOR) 50 MG tablet Take 50-75 mg by mouth 2 (two) times daily. Takes 75 mg in the morning and 50 mg in the evening   Yes Historical Provider, MD  omeprazole (PRILOSEC) 20 MG capsule Take 20 mg by  mouth 2 (two) times daily.  09/03/13  Yes Historical Provider, MD  oxyCODONE-acetaminophen (ROXICET) 5-325 MG per tablet Take 1-2 tablets by mouth every 4 (four) hours as needed for severe pain. 11/09/13  Yes Harl Bowie, MD  traZODone (DESYREL) 50 MG tablet Take 50 mg by mouth at bedtime.   Yes Historical Provider, MD  VESICARE 5 MG tablet Take 5 mg by mouth daily.  10/09/13  Yes Historical Provider, MD   Physical Exam: Filed Vitals:   01/03/14 1857  BP: 131/77  Pulse: 103  Temp: 98.7 F (37.1 C)  Resp: 20    BP 131/77  Pulse 103  Temp(Src) 98.7 F (37.1 C) (Oral)  Resp 20  Ht 5\' 3"  (1.6 m)  Wt 83.4 kg (183 lb 13.8 oz)  BMI 32.58 kg/m2  SpO2 90%  General: Pleasant alert 68 year old obese Caucasian woman sitting up in bed, in no acute distress (status post nebulizer treatments and Solu-Medrol). Eyes: PERRL, normal lids, irises & conjunctiva ENT: grossly normal hearing, lips & tongue Neck: no LAD, masses or thyromegaly Cardiovascular: S1, S2, with mild tachycardia. No LE edema. Telemetry: SR, no arrhythmias  Respiratory: Breathing nonlabored at rest but mildly labored with speaking. No accessory muscles use. Mild diffuse expiratory wheezes and a few crackles. Abdomen: soft, ntnd, obese, positive bowel sounds. Skin: no rash or induration seen on limited exam Musculoskeletal: grossly normal tone BUE/BLE Psychiatric: grossly normal mood and affect, speech fluent and appropriate Neurologic: grossly non-focal.cranial nerves II through XII are grossly intact.           Labs on Admission:  Basic Metabolic Panel:  Recent Labs Lab 01/03/14 1525  NA 136*  K 3.9  CL 96  CO2 25  GLUCOSE 128*  BUN 9  CREATININE 0.55  CALCIUM 9.3   Liver Function Tests:  Recent Labs Lab 01/03/14 1525  AST 16  ALT 12  ALKPHOS 88  BILITOT 0.7  PROT 7.9  ALBUMIN 3.7   No results found for this basename: LIPASE, AMYLASE,  in the last 168 hours No results found for this basename:  AMMONIA,  in the last 168 hours CBC:  Recent Labs Lab 01/03/14 1525  WBC 14.3*  NEUTROABS 12.8*  HGB 14.2  HCT 42.5  MCV 94.0  PLT 292   Cardiac Enzymes:  Recent Labs Lab 01/03/14 1525  TROPONINI <0.30    BNP (last 3 results)  Recent Labs  01/03/14 1525  PROBNP 287.4*   CBG: No results found for this basename: GLUCAP,  in the last 168 hours  Radiological Exams on Admission: Dg Chest 2 View  01/03/2014   CLINICAL DATA:  Shortness of breath and fever  EXAM: CHEST  2 VIEW  COMPARISON:  November 05, 2013  FINDINGS: Lungs are clear. Heart size and pulmonary vascularity are within normal limits. No adenopathy. There is atherosclerotic change in the aortic arch region. There is degenerative change in the thoracic spine.  IMPRESSION: No edema or consolidation.   Electronically Signed   By: Lowella Grip M.D.   On: 01/03/2014 16:07   Ct Angio Chest Pe W/cm &/or Wo Cm  01/03/2014   CLINICAL DATA:  Shortness of breath. History of right-sided breast cancer with radiation therapy.  EXAM: CT ANGIOGRAPHY CHEST WITH CONTRAST  TECHNIQUE: Multidetector CT imaging of the chest was performed using the standard protocol during bolus administration of intravenous contrast. Multiplanar CT image reconstructions including MIPs were obtained to evaluate the vascular anatomy.  CONTRAST:  164mL OMNIPAQUE IOHEXOL 350 MG/ML SOLN  COMPARISON:  DG CHEST 2 VIEW dated 01/03/2014; DG CHEST 2 VIEW dated 11/05/2013; CT CHEST W/ CM dated 01/10/2007; CT SIM BREAST dated 12/14/2013  FINDINGS: The pulmonary arteries are well opacified with contrast. There is no evidence of acute pulmonary embolism.  There is mild atherosclerosis of the aorta, great vessels and coronary arteries. The heart size is normal. There is no pleural or pericardial effusion. There are no enlarged mediastinal, hilar or axillary lymph nodes. Asymmetric density laterally in the right breast has decreased in size from the simulation study of 3 weeks ago  and is likely a postoperative fluid collection.  There is mild scarring at the right apex and left lung base. No confluent airspace opacity or suspicious pulmonary nodule is demonstrated.  The visualized upper abdomen is unremarkable. There are no worrisome osseous findings.  Review of the MIP images confirms the above findings.  IMPRESSION: No evidence of acute pulmonary embolism or other acute chest process. Improving postoperative fluid collection in the right breast.   Electronically Signed   By: Camie Patience M.D.   On: 01/03/2014 16:51    EKG: Independently reviewed. Sinus tachycardia with nonspecific ST abnormalities and a heart rate of 107 beats per minute.  Assessment/Plan Principal Problem:   COPD exacerbation Active Problems:   Acute respiratory failure with hypoxia   Tobacco abuse   DCIS (ductal carcinoma in situ) of breast   Hyperglycemia   Hypertension   1. This is a pleasant 68 year old woman who presents with COPD exacerbation with acute bronchitis. She also has acute respiratory failure with hypoxia secondary to COPD exacerbation. She is not on oxygen therapy at home. She continues to smoke which was not advisable. She was strongly advised to stop smoking. After receiving oxygen, Levaquin, Solu-Medrol, and albuterol/Atrovent nebulizers, she is feeling somewhat better, but still requires hospitalization for further treatment. She does not appear to be in acute respiratory distress at this time. Her tachycardia is likely secondary to initial respiratory distress and bronchodilators.    Plan: 1. The patient received Solu-Medrol, oxygen, and nebulizers in the emergency department. She also received Levaquin IV. 2. We'll continue treatment with IV Solu-Medrol, every 4 hours Xopenex/Atrovent nebulizers, IV Solu-Medrol, and oxygen titrated to keep oxygen saturations 92% or above. 3. Will start symptomatic treatment with Tessalon Perles and when necessary Robitussin. 4. Nicotine patch  offered, but she refuses. We'll order tobacco cessation counseling. She was advised to stop smoking. 5. We'll decrease the dose of metoprolol without discontinuing it altogether. If her bronchospasms worsen, will consider holding beta blocker. 6. Gentle IV fluids for hydration. 7. We'll start sliding scale NovoLog for steroid-induced hyperglycemia.    Code Status: Full code Family Communication: Discussed with her daughter. Disposition Plan: Discharge  to home when clinically appropriate.  Time spent: One hour  [facility] Pager [phone]

## 2014-01-03 NOTE — Progress Notes (Deleted)
[facility] History and Physical  JAMISE PENTLAND FOY:[id number] DOB: 1946-09-27 DOA: 01/03/2014  Referring physician: EDP, Dr. Wyvonnia Dusky PCP: Glenda Chroman., MD   Chief Complaint: Shortness of breath  HPI: Laura Abbott is a 68 y.o. female with a history of COPD, tobacco abuse, hypertension, and right-sided breast cancer, who presents to the emergency department with a chief complaint of shortness of breath. Her shortness of breath started this morning. It occurred at rest and with activity. She denies orthopnea. She had a cough which was mostly nonproductive. She had a fever of 100.3 at home. She denies chills. She denies pleurisy, chest pain, nausea, vomiting, abdominal pain, diarrhea, or unusual swelling in her legs. She had to use her inhaler 3 times which was unusual for her. She smokes a half a pack to a pack of cigarettes per day, but was only able to smoke 2 cigarettes today.  In the emergency department, she is febrile with a temperature of 101.2. She is mildly tachycardic, but her blood pressure is within normal limits. Her oxygen saturation decreased to 86% following ambulation. CT angiogram of her chest revealed no evidence of pulmonary embolism or acute chest process. Her lab data are significant for a serum sodium of 136, glucose of 128, normal troponin I., WBC of 14.3, proBNP of 287, and negative influenza panel. She is being admitted for further evaluation and management.     Review of Systems:  As above in history present illness, otherwise negative.    Past Medical History  Diagnosis Date  . Anxiety   . GERD (gastroesophageal reflux disease)   . Hyperlipidemia   . Depression   . Arthritis   . Asthma   . Cancer     breast, right DCIS  . COPD (chronic obstructive pulmonary disease)   . Emphysema of lung   . Hypertension   . Osteoporosis   . Osteoporosis, unspecified 12/01/2013  . Tobacco abuse 12/01/2013   Past Surgical History  Procedure Laterality Date  .  Cholecystectomy    . Abdominal hysterectomy    . Breast surgery      lumpectomy  . Bladder surgery      had tacked  . Breast lumpectomy with needle localization and axillary sentinel lymph node bx Right 11/09/2013    Procedure: BREAST LUMPECTOMY WITH NEEDLE LOCALIZATION AND AXILLARY SENTINEL LYMPH NODE BIOPSY;  Surgeon: Harl Bowie, MD;  Location: [facility];  Service: General;  Laterality: Right;   Social History: She is married. She has 3 children. She works part-time. She has been smoking for many years. She is now cut down to half a pack to one pack of cigarettes per day. She denies illicit drug use or alcohol use.    No Known Allergies  Family History  Problem Relation Age of Onset  . Cancer Mother     lung  . Cancer Father     pancreatic  . Cancer Sister     leukemia  . Cancer Sister     liver  . Cancer Sister     colon  . Cancer Sister     BREAST     Prior to Admission medications   Medication Sig Start Date End Date Taking? Authorizing Provider  albuterol (PROVENTIL HFA;VENTOLIN HFA) 108 (90 BASE) MCG/ACT inhaler Inhale into the lungs every 6 (six) hours as needed for wheezing or shortness of breath.   Yes Historical Provider, MD  ALPRAZolam Duanne Moron) 0.5 MG tablet Take 0.5 mg by mouth at bedtime as needed  for anxiety.   Yes Historical Provider, MD  Calcium 600-200 MG-UNIT per tablet Take 1 tablet by mouth daily.   Yes Historical Provider, MD  docusate sodium (COLACE) 100 MG capsule Take 100 mg by mouth daily as needed for mild constipation.   Yes Historical Provider, MD  FLUoxetine (PROZAC) 40 MG capsule Take 40 mg by mouth daily.   Yes Historical Provider, MD  lisinopril (PRINIVIL,ZESTRIL) 10 MG tablet Take 10 mg by mouth daily.   Yes Historical Provider, MD  metoprolol (LOPRESSOR) 50 MG tablet Take 50-75 mg by mouth 2 (two) times daily. Takes 75 mg in the morning and 50 mg in the evening   Yes Historical Provider, MD  omeprazole (PRILOSEC) 20 MG capsule Take 20 mg by  mouth 2 (two) times daily.  09/03/13  Yes Historical Provider, MD  oxyCODONE-acetaminophen (ROXICET) 5-325 MG per tablet Take 1-2 tablets by mouth every 4 (four) hours as needed for severe pain. 11/09/13  Yes Harl Bowie, MD  traZODone (DESYREL) 50 MG tablet Take 50 mg by mouth at bedtime.   Yes Historical Provider, MD  VESICARE 5 MG tablet Take 5 mg by mouth daily.  10/09/13  Yes Historical Provider, MD   Physical Exam: Filed Vitals:   01/03/14 1701  BP:   Pulse:   Temp: 100.1 F (37.8 C)  Resp:     BP 130/52  Pulse 110  Temp(Src) 100.1 F (37.8 C) (Oral)  Resp 20  Ht 5\' 3"  (1.6 m)  Wt 83.008 kg (183 lb)  BMI 32.43 kg/m2  SpO2 91%  General: Pleasant alert 68 year old obese Caucasian woman sitting up in bed, in no acute distress (status post nebulizer treatments and Solu-Medrol). Eyes: PERRL, normal lids, irises & conjunctiva ENT: grossly normal hearing, lips & tongue Neck: no LAD, masses or thyromegaly Cardiovascular: S1, S2, with mild tachycardia. No LE edema. Telemetry: SR, no arrhythmias  Respiratory: Breathing nonlabored at rest but mildly labored with speaking. No accessory muscles use. Mild diffuse expiratory wheezes and a few crackles. Abdomen: soft, ntnd, obese, positive bowel sounds. Skin: no rash or induration seen on limited exam Musculoskeletal: grossly normal tone BUE/BLE Psychiatric: grossly normal mood and affect, speech fluent and appropriate Neurologic: grossly non-focal.cranial nerves II through XII are grossly intact.           Labs on Admission:  Basic Metabolic Panel:  Recent Labs Lab 01/03/14 1525  NA 136*  K 3.9  CL 96  CO2 25  GLUCOSE 128*  BUN 9  CREATININE 0.55  CALCIUM 9.3   Liver Function Tests:  Recent Labs Lab 01/03/14 1525  AST 16  ALT 12  ALKPHOS 88  BILITOT 0.7  PROT 7.9  ALBUMIN 3.7   No results found for this basename: LIPASE, AMYLASE,  in the last 168 hours No results found for this basename: AMMONIA,  in  the last 168 hours CBC:  Recent Labs Lab 01/03/14 1525  WBC 14.3*  NEUTROABS 12.8*  HGB 14.2  HCT 42.5  MCV 94.0  PLT 292   Cardiac Enzymes:  Recent Labs Lab 01/03/14 1525  TROPONINI <0.30    BNP (last 3 results)  Recent Labs  01/03/14 1525  PROBNP 287.4*   CBG: No results found for this basename: GLUCAP,  in the last 168 hours  Radiological Exams on Admission: Dg Chest 2 View  01/03/2014   CLINICAL DATA:  Shortness of breath and fever  EXAM: CHEST  2 VIEW  COMPARISON:  November 05, 2013  FINDINGS: Lungs  are clear. Heart size and pulmonary vascularity are within normal limits. No adenopathy. There is atherosclerotic change in the aortic arch region. There is degenerative change in the thoracic spine.  IMPRESSION: No edema or consolidation.   Electronically Signed   By: Lowella Grip M.D.   On: 01/03/2014 16:07   Ct Angio Chest Pe W/cm &/or Wo Cm  01/03/2014   CLINICAL DATA:  Shortness of breath. History of right-sided breast cancer with radiation therapy.  EXAM: CT ANGIOGRAPHY CHEST WITH CONTRAST  TECHNIQUE: Multidetector CT imaging of the chest was performed using the standard protocol during bolus administration of intravenous contrast. Multiplanar CT image reconstructions including MIPs were obtained to evaluate the vascular anatomy.  CONTRAST:  134mL OMNIPAQUE IOHEXOL 350 MG/ML SOLN  COMPARISON:  DG CHEST 2 VIEW dated 01/03/2014; DG CHEST 2 VIEW dated 11/05/2013; CT CHEST W/ CM dated 01/10/2007; CT SIM BREAST dated 12/14/2013  FINDINGS: The pulmonary arteries are well opacified with contrast. There is no evidence of acute pulmonary embolism.  There is mild atherosclerosis of the aorta, great vessels and coronary arteries. The heart size is normal. There is no pleural or pericardial effusion. There are no enlarged mediastinal, hilar or axillary lymph nodes. Asymmetric density laterally in the right breast has decreased in size from the simulation study of 3 weeks ago and is  likely a postoperative fluid collection.  There is mild scarring at the right apex and left lung base. No confluent airspace opacity or suspicious pulmonary nodule is demonstrated.  The visualized upper abdomen is unremarkable. There are no worrisome osseous findings.  Review of the MIP images confirms the above findings.  IMPRESSION: No evidence of acute pulmonary embolism or other acute chest process. Improving postoperative fluid collection in the right breast.   Electronically Signed   By: Camie Patience M.D.   On: 01/03/2014 16:51    EKG: Independently reviewed. Sinus tachycardia with nonspecific ST abnormalities and a heart rate of 107 beats per minute.  Assessment/Plan Principal Problem:   COPD exacerbation Active Problems:   Acute respiratory failure with hypoxia   Tobacco abuse   DCIS (ductal carcinoma in situ) of breast   Hyperglycemia   Hypertension   1. This is a pleasant 68 year old woman who presents with COPD exacerbation with acute bronchitis. She also has acute respiratory failure with hypoxia secondary to COPD exacerbation. She is not on oxygen therapy at home. She continues to smoke which was not advisable. She was strongly advised to stop smoking. After receiving oxygen, Levaquin, Solu-Medrol, and albuterol/Atrovent nebulizers, she is feeling somewhat better, but still requires hospitalization for further treatment. She does not appear to be in acute respiratory distress at this time. Her tachycardia is likely secondary to initial respiratory distress and bronchodilators.    Plan: 1. The patient received Solu-Medrol, oxygen, and nebulizers in the emergency department. She also received Levaquin IV. 2. We'll continue treatment with IV Solu-Medrol, every 4 hours Xopenex/Atrovent nebulizers, IV Solu-Medrol, and oxygen titrated to keep oxygen saturations 92% or above. 3. Will start symptomatic treatment with Tessalon Perles and when necessary Robitussin. 4. Nicotine patch  offered, but she refuses. We'll order tobacco cessation counseling. She was advised to stop smoking. 5. We'll decrease the dose of metoprolol without discontinuing it altogether. If her bronchospasms worsen, will consider holding beta blocker. 6. Gentle IV fluids for hydration. 7. We'll start sliding scale NovoLog for steroid-induced hyperglycemia.    Code Status: Full code Family Communication: Discussed with her daughter. Disposition Plan: Discharge to home  when clinically appropriate.  Time spent: One hour  [facility] Pager [phone]

## 2014-01-03 NOTE — ED Notes (Signed)
Pt. Ambulated around nursing station. O2 % dropped from 92% down to 86%.

## 2014-01-03 NOTE — Progress Notes (Signed)
PT is on 3rd week Radiation for breast cancer, still has low SAT's 90 on 3lpm..

## 2014-01-04 LAB — TSH: TSH: 0.467 u[IU]/mL (ref 0.350–4.500)

## 2014-01-04 LAB — BASIC METABOLIC PANEL
BUN: 13 mg/dL (ref 6–23)
CO2: 27 mEq/L (ref 19–32)
Calcium: 9.3 mg/dL (ref 8.4–10.5)
Chloride: 103 mEq/L (ref 96–112)
Creatinine, Ser: 0.59 mg/dL (ref 0.50–1.10)
Glucose, Bld: 155 mg/dL — ABNORMAL HIGH (ref 70–99)
POTASSIUM: 4.3 meq/L (ref 3.7–5.3)
Sodium: 142 mEq/L (ref 137–147)

## 2014-01-04 LAB — CBC
HEMATOCRIT: 40.4 % (ref 36.0–46.0)
Hemoglobin: 13.1 g/dL (ref 12.0–15.0)
MCH: 30.8 pg (ref 26.0–34.0)
MCHC: 32.4 g/dL (ref 30.0–36.0)
MCV: 95.1 fL (ref 78.0–100.0)
PLATELETS: 259 10*3/uL (ref 150–400)
RBC: 4.25 MIL/uL (ref 3.87–5.11)
RDW: 13.8 % (ref 11.5–15.5)
WBC: 8.7 10*3/uL (ref 4.0–10.5)

## 2014-01-04 LAB — GLUCOSE, CAPILLARY
GLUCOSE-CAPILLARY: 124 mg/dL — AB (ref 70–99)
Glucose-Capillary: 170 mg/dL — ABNORMAL HIGH (ref 70–99)
Glucose-Capillary: 155 mg/dL — ABNORMAL HIGH (ref 70–99)
Glucose-Capillary: 155 mg/dL — ABNORMAL HIGH (ref 70–99)

## 2014-01-04 NOTE — Progress Notes (Signed)
Nutrition Brief Note  Patient identified on the Malnutrition Screening Tool (MST) Report  Wt Readings from Last 15 Encounters:  01/04/14 183 lb 11.2 oz (83.326 kg)  12/01/13 184 lb 12.8 oz (83.825 kg)  11/24/13 186 lb (84.369 kg)  11/05/13 184 lb 8.4 oz (83.7 kg)  10/28/13 187 lb 12.8 oz (85.186 kg)    Body mass index is 32.55 kg/(m^2). Patient meets criteria for obesity, class II based on current BMI.   Current diet order is Heart Healthy, patient is consuming approximately 100% of meals at this time. Labs and medications reviewed.   No nutrition interventions warranted at this time. If nutrition issues arise, please consult RD.   Laura Abbott A. Jimmye Norman, RD, LDN Pager: [phone]

## 2014-01-04 NOTE — Progress Notes (Signed)
01/04/14 1730 Patient O2 sat 87% on r/a while at rest. Ambulated in hallway with nurse tech assist, tolerated well, no complaints. O2 sats 78% on r/a while ambulating. Pt assisted back to bed per nurse tech, O2 sats 91% on 3 lpm. Donavan Foil, RN

## 2014-01-04 NOTE — Progress Notes (Signed)
UR chart review completed.  

## 2014-01-04 NOTE — Progress Notes (Signed)
[facility] PROGRESS NOTE  Laura Abbott IDP:[id number] DOB: December 14, 1945 DOA: 01/03/2014 PCP: Glenda Chroman., MD    Code Status: Full code Family Communication: Discussed with her husband. Disposition Plan: Anticipate discharge to home in the next 24-48 hours.   Consultants:  None  Procedures:  None  Antibiotics:  IV Levaquin 01/03/14>>  HPI/Subjective: The patient says that she feels some chest tightness and is coughing, but "nothing will come up".  She still has some shortness of breath with ambulation. She had a good night sleep.   Objective: Filed Vitals:   01/04/14 0427  BP: 124/54  Pulse: 83  Temp: 97.6 F (36.4 C)  Resp: 20   Oxygen saturation on supplement oxygen 91%. Temperature maximum 101.2.   Intake/Output Summary (Last 24 hours) at 01/04/14 1035 Last data filed at 01/04/14 0600  Gross per 24 hour  Intake 645.67 ml  Output      0 ml  Net 645.67 ml   Filed Weights   01/03/14 1454 01/03/14 1857 01/04/14 0427  Weight: 83.008 kg (183 lb) 83.4 kg (183 lb 13.8 oz) 83.326 kg (183 lb 11.2 oz)    Exam:   General:  Pleasant 68 year old woman, sitting up in bed, in no acute distress.  Cardiovascular: S1, S2, with a soft systolic murmur.  Respiratory: Breathing is nonlabored. Less chest congestion/crackles/wheezes compared to yesterday, but with a few lower lobe faint wheezes.  Abdomen: Positive bowel sounds, soft, nontender, nondistended.  Musculoskeletal: No acute hot red joints.  Neuropsychiatric: She is alert. X3. Cranial nerves II through XII are intact. Her speech is clear. She has a pleasant affect.   Data Reviewed: Basic Metabolic Panel:  Recent Labs Lab 01/03/14 1525 01/04/14 0502  NA 136* 142  K 3.9 4.3  CL 96 103  CO2 25 27  GLUCOSE 128* 155*  BUN 9 13  CREATININE 0.55 0.59  CALCIUM 9.3 9.3   Liver Function Tests:  Recent Labs Lab 01/03/14 1525  AST 16  ALT 12  ALKPHOS 88  BILITOT 0.7  PROT 7.9  ALBUMIN 3.7   No  results found for this basename: LIPASE, AMYLASE,  in the last 168 hours No results found for this basename: AMMONIA,  in the last 168 hours CBC:  Recent Labs Lab 01/03/14 1525 01/04/14 0502  WBC 14.3* 8.7  NEUTROABS 12.8*  --   HGB 14.2 13.1  HCT 42.5 40.4  MCV 94.0 95.1  PLT 292 259   Cardiac Enzymes:  Recent Labs Lab 01/03/14 1525  TROPONINI <0.30   BNP (last 3 results)  Recent Labs  01/03/14 1525  PROBNP 287.4*   CBG:  Recent Labs Lab 01/03/14 2136 01/04/14 0721  GLUCAP 151* 170*    No results found for this or any previous visit (from the past 240 hour(s)).   Studies: Dg Chest 2 View  01/03/2014   CLINICAL DATA:  Shortness of breath and fever  EXAM: CHEST  2 VIEW  COMPARISON:  November 05, 2013  FINDINGS: Lungs are clear. Heart size and pulmonary vascularity are within normal limits. No adenopathy. There is atherosclerotic change in the aortic arch region. There is degenerative change in the thoracic spine.  IMPRESSION: No edema or consolidation.   Electronically Signed   By: Lowella Grip M.D.   On: 01/03/2014 16:07   Ct Angio Chest Pe W/cm &/or Wo Cm  01/03/2014   CLINICAL DATA:  Shortness of breath. History of right-sided breast cancer with radiation therapy.  EXAM: CT ANGIOGRAPHY CHEST WITH CONTRAST  TECHNIQUE: Multidetector CT imaging of the chest was performed using the standard protocol during bolus administration of intravenous contrast. Multiplanar CT image reconstructions including MIPs were obtained to evaluate the vascular anatomy.  CONTRAST:  145mL OMNIPAQUE IOHEXOL 350 MG/ML SOLN  COMPARISON:  DG CHEST 2 VIEW dated 01/03/2014; DG CHEST 2 VIEW dated 11/05/2013; CT CHEST W/ CM dated 01/10/2007; CT SIM BREAST dated 12/14/2013  FINDINGS: The pulmonary arteries are well opacified with contrast. There is no evidence of acute pulmonary embolism.  There is mild atherosclerosis of the aorta, great vessels and coronary arteries. The heart size is normal. There is  no pleural or pericardial effusion. There are no enlarged mediastinal, hilar or axillary lymph nodes. Asymmetric density laterally in the right breast has decreased in size from the simulation study of 3 weeks ago and is likely a postoperative fluid collection.  There is mild scarring at the right apex and left lung base. No confluent airspace opacity or suspicious pulmonary nodule is demonstrated.  The visualized upper abdomen is unremarkable. There are no worrisome osseous findings.  Review of the MIP images confirms the above findings.  IMPRESSION: No evidence of acute pulmonary embolism or other acute chest process. Improving postoperative fluid collection in the right breast.   Electronically Signed   By: Camie Patience M.D.   On: 01/03/2014 16:51    Scheduled Meds: . benzonatate  100 mg Oral TID  . calcium-vitamin D  1 tablet Oral Daily  . darifenacin  7.5 mg Oral Daily  . docusate sodium  100 mg Oral BID  . enoxaparin (LOVENOX) injection  40 mg Subcutaneous Q24H  . FLUoxetine  40 mg Oral Daily  . insulin aspart  0-20 Units Subcutaneous TID WC  . insulin aspart  0-5 Units Subcutaneous QHS  . ipratropium  0.5 mg Nebulization Q4H  . levalbuterol  0.63 mg Nebulization Q4H  . levofloxacin (LEVAQUIN) IV  750 mg Intravenous Q24H  . lisinopril  10 mg Oral Daily  . methylPREDNISolone (SOLU-MEDROL) injection  60 mg Intravenous Q6H  . metoprolol  25 mg Oral BID  . pantoprazole  40 mg Oral Daily  . traZODone  50 mg Oral QHS   Continuous Infusions: . 0.9 % NaCl with KCl 20 mEq / L 65 mL/hr at 01/03/14 2004   Assessment:  Principal Problem:   COPD exacerbation Active Problems:   Acute respiratory failure with hypoxia   Tobacco abuse   DCIS (ductal carcinoma in situ) of breast   Hyperglycemia   Hypertension   1. COPD with exacerbation. She is slightly improved and clinically. We will continue current management with IV Solu-Medrol, IV Levaquin, Xopenex/Atrovent nebulizers, and oxygen.  Continue symptomatic treatment with Tessalon Perles.  Acute respiratory failure with hypoxia. She may have underlying chronic hypoxia from her COPD, but this is not definitively known. She has no history of oxygen dependence. Continue oxygen supplementation. We'll need to check her oxygen saturation on room air at rest and with activity.  Tobacco abuse. The patient was strongly advised to stop smoking. She declines nicotine replacement therapy.  Hypertension. Currently stable. We'll continue lisinopril and metoprolol at lower doses.  Steroid-induced hyperglycemia. Continue sliding scale NovoLog.  Breast cancer. She is undergoing radiation therapy. This should resume following discharge.    Plan: 1. We'll check her oxygen saturation on room air at rest and with activity. 2. Saline lock IV after 24-hour inffusion. 3. Continue current management otherwise.  Time spent: 35 minutes    [facility] Pager  [phone]. If 7PM-7AM, please contact night-coverage at [email or web address], password [facility] 01/04/2014, 10:35 AM  LOS: 1 day

## 2014-01-04 NOTE — Care Management Note (Addendum)
    Page 1 of 2   01/05/2014     12:36:03 PM   CARE MANAGEMENT NOTE 01/05/2014  Patient:  Laura Abbott, Laura Abbott   Account Number:  [id number]  Date Initiated:  01/04/2014  Documentation initiated by:  Theophilus Kinds  Subjective/Objective Assessment:   Pt admitted from home with COPD. Pt lives with her husband and will return home at discharge. Pt is independent with ADl's.     Action/Plan:   No CM needs noted at this time.   Anticipated DC Date:  01/07/2014   Anticipated DC Plan:  HOME/SELF CARE      DC Planning Services  CM consult      PAC Choice  DURABLE MEDICAL EQUIPMENT   Choice offered to / List presented to:  C-1 Patient   DME arranged  [facility]      DME agency  [facility]        Status of service:  Completed, signed off Medicare Important Message given?  YES (If response is "NO", the following Medicare IM given date fields will be blank) Date Medicare IM given:  01/05/2014 Date Additional Medicare IM given:    Discharge Disposition:  HOME/SELF CARE  Per UR Regulation:    If discussed at Long Length of Stay Meetings, dates discussed:    Comments:  01/05/14 [place], RN BSN CM Pt to be discharged today with home O2 with [employer] (pt given list of agency choices). Information sent to [facility]. Pt will have portable O2 delivered to pts room prior to discharge and concentrator will be delivered to pts home. Pt and pts nurse aware of discharge arrangements.  01/04/14 [place], RN BSN CM

## 2014-01-05 ENCOUNTER — Encounter (HOSPITAL_COMMUNITY): Payer: Self-pay | Admitting: Internal Medicine

## 2014-01-05 DIAGNOSIS — E669 Obesity, unspecified: Secondary | ICD-10-CM | POA: Diagnosis present

## 2014-01-05 HISTORY — DX: Obesity, unspecified: E66.9

## 2014-01-05 LAB — GLUCOSE, CAPILLARY
Glucose-Capillary: 159 mg/dL — ABNORMAL HIGH (ref 70–99)
Glucose-Capillary: 138 mg/dL — ABNORMAL HIGH (ref 70–99)

## 2014-01-05 MED ORDER — PREDNISONE 10 MG PO TABS: ORAL_TABLET | ORAL | Status: DC

## 2014-01-05 MED ORDER — IPRATROPIUM BROMIDE 0.02 % IN SOLN
0.5000 mg | Freq: Four times a day (QID) | RESPIRATORY_TRACT | Status: DC
  Administered 2014-01-05 (×2): 0.5 mg via RESPIRATORY_TRACT
  Filled 2014-01-05 (×2): qty 2.5

## 2014-01-05 MED ORDER — LEVOFLOXACIN 750 MG PO TABS: 750.0000 mg | ORAL_TABLET | Freq: Every day | ORAL | Status: DC

## 2014-01-05 MED ORDER — LEVOFLOXACIN 500 MG PO TABS: 500.0000 mg | ORAL_TABLET | Freq: Every day | ORAL | Status: DC

## 2014-01-05 MED ORDER — BENZONATATE 100 MG PO CAPS: 100.0000 mg | ORAL_CAPSULE | Freq: Three times a day (TID) | ORAL | Status: DC

## 2014-01-05 MED ORDER — LEVALBUTEROL HCL 0.63 MG/3ML IN NEBU
0.6300 mg | INHALATION_SOLUTION | Freq: Four times a day (QID) | RESPIRATORY_TRACT | Status: DC
  Administered 2014-01-05 (×2): 0.63 mg via RESPIRATORY_TRACT
  Filled 2014-01-05 (×2): qty 3

## 2014-01-05 MED ORDER — ALBUTEROL SULFATE HFA 108 (90 BASE) MCG/ACT IN AERS: 2.0000 | INHALATION_SPRAY | Freq: Three times a day (TID) | RESPIRATORY_TRACT | Status: AC

## 2014-01-05 NOTE — Discharge Planning (Signed)
Pt SPO2 @ rest (RA) was 94% Pt SP02 with ambulation on RA was 86% Pt SPO2 with ambulation on 3L was 90% Pt will need O2 for Discharge.

## 2014-01-05 NOTE — Discharge Planning (Signed)
Waiting on [facility] to delivered O2 for DC.

## 2014-01-05 NOTE — Progress Notes (Signed)
PHARMACIST - PHYSICIAN COMMUNICATION DR:   Fisher  CONCERNING: Antibiotic IV to Oral Route Change Policy  RECOMMENDATION: This patient is receiving Levaquin by the intravenous route.  Based on criteria approved by the Pharmacy and Therapeutics Committee, the antibiotic(s) is/are being converted to the equivalent oral dose form(s).   DESCRIPTION: These criteria include:  Patient being treated for a respiratory tract infection, urinary tract infection, cellulitis or clostridium difficile associated diarrhea if on metronidazole  The patient is not neutropenic and does not exhibit a GI malabsorption state  The patient is eating (either orally or via tube) and/or has been taking other orally administered medications for a least 24 hours  The patient is improving clinically and has a Tmax < 100.5  If you have questions about this conversion, please contact the Pharmacy Department  [x]  ( [phone] )  [facility] []  ( [phone] )  [facility]  []  ( [phone] )  [facility] []  ( [phone] )   [facility]   S. Jaylon Grode, PharmD  

## 2014-01-05 NOTE — Discharge Summary (Signed)
Physician Discharge Summary  [name] NIO:[id number] DOB: [birth year] DOA: 01/03/2014  PCP: Glenda Chroman., MD  Admit date: 01/03/2014 Discharge date: 01/05/2014  Time spent: Greater than 30 minutes  Recommendations for Outpatient Follow-up:  1. The patient will need her 02 saturation's/pulse oximetry reassessed on room air with ambulation at her follow up appointments to see if she still requires home 02.   Discharge Diagnoses:    COPD exacerbation   Acute respiratory failure with hypoxia - 02 saturation 86% on room air after ambulation 01/05/14.   Tobacco abuse   DCIS (ductal carcinoma in situ) of breast   Hyperglycemia, steroid-induced   Hypertension   Obesity   Discharge Condition: Improved  Diet recommendation: Heart healthy  Filed Weights   01/03/14 1857 01/04/14 0427 01/05/14 0500  Weight: 83.4 kg (183 lb 13.8 oz) 83.326 kg (183 lb 11.2 oz) 83.598 kg (184 lb 4.8 oz)    History of present illness:  Laura Abbott is a 68 y.o. female with a history of COPD, tobacco abuse, hypertension, and right-sided breast cancer, who presented to the emergency department with a chief complaint of shortness of breath. It occurred at rest and with activity. She denied orthopnea. She had a cough which was mostly nonproductive. She had a fever of 100.3 at home. She denied chills. She denied pleurisy, chest pain, nausea, vomiting, abdominal pain, diarrhea, or unusual swelling in her legs. She had to use her inhaler 3 times which was unusual for her. She smokes a half a pack to a pack of cigarettes per day, but was only able to smoke 2 cigarettes on the day of admission.  In the emergency department, she was febrile with a temperature of 101.2. She was mildly tachycardic, but her blood pressure was within normal limits. Her oxygen saturation decreased to 86% following ambulation. CT angiogram of her chest revealed no evidence of pulmonary embolism or acute chest process. Her lab data were significant for  a serum sodium of 136, glucose of 128, normal troponin I., WBC of 14.3, proBNP of 287, and negative influenza panel. She was admitted for further evaluation and management.   Hospital Course:   The patient was started on supplemental 02, IV Solumedrol, IV Levaquin, Xopenex/Atrovent nebulizers, and Tessalon Perles. Gentle IV fluids were given. Sliding-scale Novolog was given for steroid-induced hyperglycemia. The dose metoprolol was decreased because of her wheezes. She was advised to stop smoking forever. Over the course of the hospitalization, she improved clinically and symptomatically. She became and remained afebrile. Her 02 saturations improved on room air, but her oxygen desaturated with ambulation, which qualified her for supplemental oxygen. I informed the patient that he 02 requirement did not hve to be permanent and that her doctor could reassess her need at her follow up appointments. She was discharged on Levaquin, a prednisone taper and an albuterol inhaler.  Procedures:  None  Consultations:  None  Discharge Exam: Filed Vitals:   01/05/14 1453  BP: 147/54  Pulse: 81  Temp: 97.9 F (36.6 C)  Resp: 20   General: Pleasant 68 year old woman, sitting up in bed, in no acute distress. Cardiovascular: S1, S2, with a soft systolic murmur. Respiratory: Breathing is nonlabored. Fewer lower lobe faint wheezes. Abdomen: Positive bowel sounds, soft, nontender, nondistended. Musculoskeletal: No acute hot red joints. Neuropsychiatric: She is alert. X3. Cranial nerves II through XII are intact. Her speech is clear. She has a pleasant affect.     Discharge Instructions  Discharge Orders   Future Appointments Provider  Department Dept Phone   02/01/2014 3:15 PM Heath Lark, MD [facility] [phone]   02/08/2014 4:10 PM Harl Bowie, MD [facility] [place] [phone]   Future Orders Complete By Expires   Diet - low sodium heart healthy  As  directed    Discharge instructions  As directed    Comments:     Do not smoke. Where your oxygen during ambulation and at rest if he feels short winded. Followup with your primary care physician in one week or less. Have your oxygen saturations reassessed while walking on room air to see if he still needs supplemental oxygen.   Increase activity slowly  As directed        Medication List         albuterol 108 (90 BASE) MCG/ACT inhaler  Commonly known as:  PROVENTIL HFA;VENTOLIN HFA  Inhale 2 puffs into the lungs 3 (three) times daily. Also use the inhaler every 2 hours if needed for worsening shortness of breath and wheezing.     ALPRAZolam 0.5 MG tablet  Commonly known as:  XANAX  Take 0.5 mg by mouth at bedtime as needed for anxiety.     benzonatate 100 MG capsule  Commonly known as:  TESSALON  Take 1 capsule (100 mg total) by mouth 3 (three) times daily.     Calcium 600-200 MG-UNIT per tablet  Take 1 tablet by mouth daily.     docusate sodium 100 MG capsule  Commonly known as:  COLACE  Take 100 mg by mouth daily as needed for mild constipation.     FLUoxetine 40 MG capsule  Commonly known as:  PROZAC  Take 40 mg by mouth daily.     levofloxacin 500 MG tablet  Commonly known as:  LEVAQUIN  Take 1 tablet (500 mg total) by mouth daily. Antibiotic to be taken for 5 more days.     lisinopril 10 MG tablet  Commonly known as:  PRINIVIL,ZESTRIL  Take 10 mg by mouth daily.     metoprolol 50 MG tablet  Commonly known as:  LOPRESSOR  Take 50-75 mg by mouth 2 (two) times daily. Takes 75 mg in the morning and 50 mg in the evening     omeprazole 20 MG capsule  Commonly known as:  PRILOSEC  Take 20 mg by mouth 2 (two) times daily.     oxyCODONE-acetaminophen 5-325 MG per tablet  Commonly known as:  ROXICET  Take 1-2 tablets by mouth every 4 (four) hours as needed for severe pain.     predniSONE 10 MG tablet  Commonly known as:  DELTASONE  Starting tomorrow, take 6 tablets  for 1 day; then 5 tablets the next day; then 4 tablets the next day; then 3 tablets the next day; then 2 tablets the next day; then 1 tablet the next day; then stop.     traZODone 50 MG tablet  Commonly known as:  DESYREL  Take 50 mg by mouth at bedtime.     VESICARE 5 MG tablet  Generic drug:  solifenacin  Take 5 mg by mouth daily.       No Known Allergies     Follow-up Information   Follow up with VYAS,DHRUV B., MD. Schedule an appointment as soon as possible for a visit in 1 week.   Specialty:  Internal Medicine   Contact information:   [place] [id number] [phone]        The results  of significant diagnostics from this hospitalization (including imaging, microbiology, ancillary and laboratory) are listed below for reference.    Significant Diagnostic Studies: Dg Chest 2 View  01/03/2014   CLINICAL DATA:  Shortness of breath and fever  EXAM: CHEST  2 VIEW  COMPARISON:  November 05, 2013  FINDINGS: Lungs are clear. Heart size and pulmonary vascularity are within normal limits. No adenopathy. There is atherosclerotic change in the aortic arch region. There is degenerative change in the thoracic spine.  IMPRESSION: No edema or consolidation.   Electronically Signed   By: Lowella Grip M.D.   On: 01/03/2014 16:07   Ct Angio Chest Pe W/cm &/or Wo Cm  01/03/2014   CLINICAL DATA:  Shortness of breath. History of right-sided breast cancer with radiation therapy.  EXAM: CT ANGIOGRAPHY CHEST WITH CONTRAST  TECHNIQUE: Multidetector CT imaging of the chest was performed using the standard protocol during bolus administration of intravenous contrast. Multiplanar CT image reconstructions including MIPs were obtained to evaluate the vascular anatomy.  CONTRAST:  153mL OMNIPAQUE IOHEXOL 350 MG/ML SOLN  COMPARISON:  DG CHEST 2 VIEW dated 01/03/2014; DG CHEST 2 VIEW dated 11/05/2013; CT CHEST W/ CM dated 01/10/2007; CT SIM BREAST dated 12/14/2013  FINDINGS: The pulmonary arteries  are well opacified with contrast. There is no evidence of acute pulmonary embolism.  There is mild atherosclerosis of the aorta, great vessels and coronary arteries. The heart size is normal. There is no pleural or pericardial effusion. There are no enlarged mediastinal, hilar or axillary lymph nodes. Asymmetric density laterally in the right breast has decreased in size from the simulation study of 3 weeks ago and is likely a postoperative fluid collection.  There is mild scarring at the right apex and left lung base. No confluent airspace opacity or suspicious pulmonary nodule is demonstrated.  The visualized upper abdomen is unremarkable. There are no worrisome osseous findings.  Review of the MIP images confirms the above findings.  IMPRESSION: No evidence of acute pulmonary embolism or other acute chest process. Improving postoperative fluid collection in the right breast.   Electronically Signed   By: Camie Patience M.D.   On: 01/03/2014 16:51    Microbiology: No results found for this or any previous visit (from the past 240 hour(s)).   Labs: Basic Metabolic Panel:  Recent Labs Lab 01/03/14 1525 01/04/14 0502  NA 136* 142  K 3.9 4.3  CL 96 103  CO2 25 27  GLUCOSE 128* 155*  BUN 9 13  CREATININE 0.55 0.59  CALCIUM 9.3 9.3   Liver Function Tests:  Recent Labs Lab 01/03/14 1525  AST 16  ALT 12  ALKPHOS 88  BILITOT 0.7  PROT 7.9  ALBUMIN 3.7   No results found for this basename: LIPASE, AMYLASE,  in the last 168 hours No results found for this basename: AMMONIA,  in the last 168 hours CBC:  Recent Labs Lab 01/03/14 1525 01/04/14 0502  WBC 14.3* 8.7  NEUTROABS 12.8*  --   HGB 14.2 13.1  HCT 42.5 40.4  MCV 94.0 95.1  PLT 292 259   Cardiac Enzymes:  Recent Labs Lab 01/03/14 1525  TROPONINI <0.30   BNP: BNP (last 3 results)  Recent Labs  01/03/14 1525  PROBNP 287.4*   CBG:  Recent Labs Lab 01/04/14 1115 01/04/14 1601 01/04/14 2009 01/05/14 0816  01/05/14 1128  GLUCAP 155* 124* 155* 159* 138*       Signed:  Neoma Uhrich  [facility] 01/05/2014, 4:45 PM

## 2014-01-05 NOTE — Discharge Summary (Signed)
Pt and family sted she was ready to be DC.  Pt's IV and tele were DCd.  Pt and family given DC papers and told of needed FU appts and also given scripts.  Pt went home with O2 tank and was educated on S/sx of respiratory distress. Pt was wheeld to car by PCT and family.

## 2014-01-06 ENCOUNTER — Encounter (HOSPITAL_COMMUNITY): Payer: Self-pay | Admitting: Internal Medicine

## 2014-01-29 ENCOUNTER — Telehealth: Payer: Self-pay | Admitting: *Deleted

## 2014-01-29 NOTE — Telephone Encounter (Signed)
OK to reschedule whenever the patient is ready

## 2014-01-29 NOTE — Telephone Encounter (Signed)
Daughter left VM states pt scheduled to see Dr. Alvy Bimler Monday afternoon.  She says Dr. Alvy Bimler wanted to see pt after she completed her Radiation treatments, but pt has about 2 more weeks of Radiation left due to being hospitalized and it was on hold for a while.  Dau asks if pt should reschedule her appt w/ Dr. Alvy Bimler until after her Radiation is complete?

## 2014-02-01 ENCOUNTER — Ambulatory Visit: Payer: Self-pay | Admitting: Hematology and Oncology

## 2014-02-01 ENCOUNTER — Telehealth: Payer: Self-pay | Admitting: *Deleted

## 2014-02-01 ENCOUNTER — Telehealth: Payer: Self-pay | Admitting: Hematology and Oncology

## 2014-02-01 NOTE — Telephone Encounter (Signed)
per 3/9 pof moved 3/9 appt 3wks out and call dtr wendy. lmonvm for wendy re new appt for 3/30 and that 3/9 has been cx'd. schedule mailed.

## 2014-02-01 NOTE — Telephone Encounter (Signed)
Daughter reports pt will be completed with her Radiation Treatments in [place] in about 2 to 3 weeks.  Informed her will re schedule pt's appt today to in 3 weeks after her Radiation treatments are completed.  Informed her to expect call from [place].  She verbalized understanding.

## 2014-02-08 ENCOUNTER — Encounter (INDEPENDENT_AMBULATORY_CARE_PROVIDER_SITE_OTHER): Payer: Self-pay | Admitting: Surgery

## 2014-02-08 ENCOUNTER — Ambulatory Visit (INDEPENDENT_AMBULATORY_CARE_PROVIDER_SITE_OTHER): Payer: Medicare Other | Admitting: Surgery

## 2014-02-08 VITALS — BP 133/89 | HR 80 | Temp 97.8°F | Resp 18 | Ht 63.0 in | Wt 184.8 lb

## 2014-02-08 DIAGNOSIS — Z853 Personal history of malignant neoplasm of breast: Secondary | ICD-10-CM

## 2014-02-08 NOTE — Progress Notes (Signed)
Subjective:     Patient ID: Laura Abbott, female   DOB: 03-Mar-1946, 68 y.o.   MRN: [id number]  HPI She is here for long-term followup of her right breast DCIS. She is currently undergoing radiation which had to be delayed because of her COPD. She has 8 more treatments left. She has no complaints.  Review of Systems     Objective:   Physical Exam  There is significant radiation change with skin Erythema of the right breast. There is no palpable mass or axillary adenopathy    Assessment:     Patient stable with a history of right breast for DCIS     Plan:     She will continue her care at the [facility] by the oncologist. I will see her back in 6 months

## 2014-02-22 ENCOUNTER — Ambulatory Visit (HOSPITAL_BASED_OUTPATIENT_CLINIC_OR_DEPARTMENT_OTHER): Payer: Medicare Other | Admitting: Hematology and Oncology

## 2014-02-22 ENCOUNTER — Encounter: Payer: Self-pay | Admitting: Hematology and Oncology

## 2014-02-22 VITALS — BP 155/60 | HR 71 | Temp 98.5°F | Resp 20 | Ht 63.0 in | Wt 187.3 lb

## 2014-02-22 DIAGNOSIS — M810 Age-related osteoporosis without current pathological fracture: Secondary | ICD-10-CM

## 2014-02-22 DIAGNOSIS — Z720 Tobacco use: Secondary | ICD-10-CM

## 2014-02-22 DIAGNOSIS — D0510 Intraductal carcinoma in situ of unspecified breast: Secondary | ICD-10-CM

## 2014-02-22 DIAGNOSIS — Z809 Family history of malignant neoplasm, unspecified: Secondary | ICD-10-CM

## 2014-02-22 DIAGNOSIS — F17200 Nicotine dependence, unspecified, uncomplicated: Secondary | ICD-10-CM

## 2014-02-22 DIAGNOSIS — D0590 Unspecified type of carcinoma in situ of unspecified breast: Secondary | ICD-10-CM

## 2014-02-22 NOTE — Progress Notes (Signed)
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 Several years ago, she had biopsies from the right breast that came back negative for malignancy. Recently, screening mammogram detected abnormalities on the right breast. On 09/28/2013, she had biopsy done that came back grade 1-2 DCIS, 100% he surgery receptor positive and 94% progesterone receptor positive. She was subsequently recommended surgery. On 11/09/2013, Dr. Ninfa Linden performed right breast lumpectomy with sentinel lymph node biopsy. According to the report, high grade DCIS was found and the tumor was estimated at 1.9 cm in size with negative margin. None of the sentinel lymph nodes were involved. She underwent radiation therapy from February to [id number]/78/9381 without complications. She has family history of Breast cancer involving her sister. One other sister had colon cancer and her father had pancreatic cancer  INTERVAL HISTORY: Laura Abbott 68 y.o. female returns for further followup. Her skin has healed well from recent radiation treatment. She has occasional mood swings but no depression. She is attempting to quit smoking.  I have reviewed the past medical history, past surgical history, social history and family history with the patient and they are unchanged from previous note.  ALLERGIES:  has No Known Allergies.  MEDICATIONS:  Current Outpatient Prescriptions  Medication Sig Dispense Refill  . albuterol (PROVENTIL HFA;VENTOLIN HFA) 108 (90 BASE) MCG/ACT inhaler Inhale 2 puffs into the lungs 3 (three) times daily. Also use the inhaler every 2 hours if needed for worsening shortness of breath and wheezing.  1 Inhaler  3  . ALPRAZolam  (XANAX) 0.5 MG tablet Take 0.5 mg by mouth at bedtime as needed for anxiety.      . Calcium 600-200 MG-UNIT per tablet Take 1 tablet by mouth daily.      Marland Kitchen econazole nitrate 1 % cream Apply topically 3 (three) times daily as needed.      Marland Kitchen FLUoxetine (PROZAC) 40 MG capsule Take 40 mg by mouth daily.      Marland Kitchen lisinopril (PRINIVIL,ZESTRIL) 10 MG tablet Take 10 mg by mouth daily.      . metoprolol (LOPRESSOR) 50 MG tablet Take 50-75 mg by mouth 2 (two) times daily. Takes 75 mg in the morning and 50 mg in the evening      . omeprazole (PRILOSEC) 20 MG capsule Take 20 mg by mouth 2 (two) times daily.       Marland Kitchen oxyCODONE-acetaminophen (ROXICET) 5-325 MG per tablet Take 1-2 tablets by mouth every 4 (four) hours as needed for severe pain.  40 tablet  0  . traZODone (DESYREL) 50 MG tablet Take 50 mg by mouth at bedtime.      . VESICARE 5 MG tablet Take 5 mg by mouth daily.       Marland Kitchen docusate sodium (COLACE) 100 MG capsule Take 100 mg by mouth daily as needed for mild constipation.       No current facility-administered medications for this visit.    REVIEW OF SYSTEMS:   Constitutional: Denies fevers, chills or abnormal weight loss Eyes: Denies blurriness of vision Ears, nose, mouth, throat, and face: Denies mucositis or sore throat Respiratory: Denies cough, dyspnea or wheezes Cardiovascular: Denies palpitation, chest discomfort or lower extremity swelling Gastrointestinal:  Denies nausea, heartburn or change in bowel habits Lymphatics:  Denies new lymphadenopathy or easy bruising Neurological:Denies numbness, tingling or new weaknesses Behavioral/Psych: Mood is stable, no new changes  All other systems were reviewed with the patient and are negative.  PHYSICAL EXAMINATION: ECOG PERFORMANCE STATUS: 0 - Asymptomatic  Filed Vitals:   02/22/14 1354  BP: 155/60  Pulse: 71  Temp: 98.5 F (36.9 C)  Resp: 20   Filed Weights   02/22/14 1354  Weight: 187 lb 4.8 oz (84.959 kg)    GENERAL:alert, no  distress and comfortable SKIN: skin color, texture, turgor are normal, no rashes or significant lesions EYES: normal, Conjunctiva are pink and non-injected, sclera clear NEURO: alert & oriented x 3 with fluent speech, no focal motor/sensory deficits Right breast examination show radiation induced skin changes without ulceration. LABORATORY DATA:  I have reviewed the data as listed    Component Value Date/Time   NA 142 01/04/2014 0502   K 4.3 01/04/2014 0502   CL 103 01/04/2014 0502   CO2 27 01/04/2014 0502   GLUCOSE 155* 01/04/2014 0502   BUN 13 01/04/2014 0502   CREATININE 0.59 01/04/2014 0502   CALCIUM 9.3 01/04/2014 0502   PROT 7.9 01/03/2014 1525   ALBUMIN 3.7 01/03/2014 1525   AST 16 01/03/2014 1525   ALT 12 01/03/2014 1525   ALKPHOS 88 01/03/2014 1525   BILITOT 0.7 01/03/2014 1525   GFRNONAA >90 01/04/2014 0502   GFRAA >90 01/04/2014 0502    No results found for this basename: SPEP,  UPEP,   kappa and lambda light chains    Lab Results  Component Value Date   WBC 8.7 01/04/2014   NEUTROABS 12.8* 01/03/2014   HGB 13.1 01/04/2014   HCT 40.4 01/04/2014   MCV 95.1 01/04/2014   PLT 259 01/04/2014      Chemistry      Component Value Date/Time   NA 142 01/04/2014 0502   K 4.3 01/04/2014 0502   CL 103 01/04/2014 0502   CO2 27 01/04/2014 0502   BUN 13 01/04/2014 0502   CREATININE 0.59 01/04/2014 0502      Component Value Date/Time   CALCIUM 9.3 01/04/2014 0502   ALKPHOS 88 01/03/2014 1525   AST 16 01/03/2014 1525   ALT 12 01/03/2014 1525   BILITOT 0.7 01/03/2014 1525     ASSESSMENT & PLAN:  #1 right breast DCIS status post lumpectomy and radiation therapy I recommend the patient to consider adjuvant endocrine therapy with tamoxifen. I discussed with her risk, benefits, side effects of tamoxifen including risk of blood clot, hot flashes, mood swings, depression and weight gain. The patient is on Prozac and that would interact with tamoxifen. I could potentially switch her Prozac with another antidepressant or to consider  using aromatase inhibitor instead of tamoxifen. After a lot of discussion, the patient has made an informed decision not to pursue adjuvant endocrine therapy. #2 osteoporosis I have asked her to sign consent her release information to get a copy of her most recent bone density scan. I recommend she take calcium with vitamin D supplement #3 tobacco abuse The patient is attempting to quit smoking. I congratulated her effort #4 strong family history of cancer I recommend she strongly consider genetic counseling. The patient wants to think about it.  All questions were answered. The patient knows to call the clinic with any problems, questions or concerns. No barriers to learning was detected. I spent 25 minutes counseling the patient face to face. The total time spent in the appointment was 30 minutes and  more than 50% was on counseling and review of test results     [facility], Trevyn Lumpkin, MD 02/22/2014 2:54 PM

## 2014-02-23 ENCOUNTER — Telehealth: Payer: Self-pay | Admitting: Hematology and Oncology

## 2014-02-23 NOTE — Telephone Encounter (Signed)
lvm for pt regarding to Nov appt....mailed pt appt shed/avs and letter

## 2014-02-24 ENCOUNTER — Encounter: Payer: Self-pay | Admitting: *Deleted

## 2014-02-24 NOTE — Progress Notes (Signed)
Faxed release of information to [facility] at fax [phone] requesting copy of most recent Bone Density scan per Dr. Calton Dach request.

## 2014-07-26 ENCOUNTER — Encounter (INDEPENDENT_AMBULATORY_CARE_PROVIDER_SITE_OTHER): Payer: Self-pay | Admitting: Surgery

## 2014-07-26 ENCOUNTER — Ambulatory Visit (INDEPENDENT_AMBULATORY_CARE_PROVIDER_SITE_OTHER): Payer: Medicare Other | Admitting: Surgery

## 2014-07-26 VITALS — BP 124/74 | HR 72 | Temp 97.6°F | Ht 63.0 in | Wt 188.0 lb

## 2014-07-26 DIAGNOSIS — Z853 Personal history of malignant neoplasm of breast: Secondary | ICD-10-CM

## 2014-07-26 NOTE — Progress Notes (Signed)
Subjective:     Patient ID: Laura Abbott, female   DOB: 1946/09/15, 69 y.o.   MRN: [id number]  HPI She is here for long-term followup regarding her breast and right breast ductal carcinoma in situ. She is status post lumpectomy with sentinel node biopsy and followup radiation. She continues to be followed at the cancer center. Other than mild pain, she has no complaints regarding her breast. She denies nipple discharge or arm swelling  Review of Systems     Objective:   Physical Exam On exam, there is no swelling in the right arm. There is no axillary adenopathy her incisions are well-healed. There are no palpable breast masses    Assessment:     Patient stable with a history of DCIS of the right breast     Plan:     She will continue to be followed at the cancer center. She will be due for mammograms later this year. I will see her back in one year unless there are problems with her mammograms.

## 2014-10-15 ENCOUNTER — Encounter: Payer: Self-pay | Admitting: Hematology and Oncology

## 2014-10-15 ENCOUNTER — Telehealth: Payer: Self-pay | Admitting: Hematology and Oncology

## 2014-10-15 ENCOUNTER — Ambulatory Visit (HOSPITAL_BASED_OUTPATIENT_CLINIC_OR_DEPARTMENT_OTHER): Payer: Medicare Other | Admitting: Hematology and Oncology

## 2014-10-15 VITALS — BP 144/53 | HR 75 | Temp 98.2°F | Resp 18 | Ht 63.0 in | Wt 192.1 lb

## 2014-10-15 DIAGNOSIS — M810 Age-related osteoporosis without current pathological fracture: Secondary | ICD-10-CM

## 2014-10-15 DIAGNOSIS — D0511 Intraductal carcinoma in situ of right breast: Secondary | ICD-10-CM

## 2014-10-15 DIAGNOSIS — Z720 Tobacco use: Secondary | ICD-10-CM

## 2014-10-15 DIAGNOSIS — D0510 Intraductal carcinoma in situ of unspecified breast: Secondary | ICD-10-CM

## 2014-10-15 DIAGNOSIS — Z299 Encounter for prophylactic measures, unspecified: Secondary | ICD-10-CM

## 2014-10-15 DIAGNOSIS — Z23 Encounter for immunization: Secondary | ICD-10-CM

## 2014-10-15 MED ORDER — PNEUMOCOCCAL VAC POLYVALENT 25 MCG/0.5ML IJ INJ
0.5000 mL | INJECTION | Freq: Once | INTRAMUSCULAR | Status: AC
  Administered 2014-10-15: 0.5 mL via INTRAMUSCULAR
  Filled 2014-10-15: qty 0.5

## 2014-10-15 MED ORDER — RALOXIFENE HCL 60 MG PO TABS: 60.0000 mg | ORAL_TABLET | Freq: Every day | ORAL | Status: DC

## 2014-10-15 NOTE — Telephone Encounter (Signed)
gv pt appt schedule for may 2016.

## 2014-10-15 NOTE — Patient Instructions (Signed)
Raloxifene tablets What is this medicine? RALOXIFENE (ral OX i feen) reduces the amount of calcium lost from bones. It is used to treat and prevent osteoporosis in women who have experienced menopause. This medicine may be used for other purposes; ask your health care provider or pharmacist if you have questions. COMMON BRAND NAME(S): Evista What should I tell my health care provider before I take this medicine? They need to know if you have any of these conditions: -a history of blood clots -cancer -heart failure -liver disease -premenopausal -an unusual or allergic reaction to raloxifene, other medicines, foods, dyes, or preservatives -pregnant or trying to get pregnant -breast-feeding How should I use this medicine? Take this medicine by mouth with a glass of water. Follow the directions on the prescription label. The tablets can be taken with or without food. Take your doses at regular intervals. Do not take your medicine more often than directed. Talk to your pediatrician regarding the use of this medicine in children. Special care may be needed. Overdosage: If you think you have taken too much of this medicine contact a poison control center or emergency room at once. NOTE: This medicine is only for you. Do not share this medicine with others. What if I miss a dose? If you miss a dose, take it as soon as you can. If it is almost time for your next dose, take only that dose. Do not take double or extra doses. What may interact with this medicine? -ampicillin -cholestyramine -colestipol -diazepam -diazoxide -female hormones like hormone replacement therapy -lidocaine -warfarin This list may not describe all possible interactions. Give your health care provider a list of all the medicines, herbs, non-prescription drugs, or dietary supplements you use. Also tell them if you smoke, drink alcohol, or use illegal drugs. Some items may interact with your medicine. What should I watch  for while using this medicine? Visit your doctor or health care professional for regular checks on your progress. Do not stop taking this medicine except on the advice of your doctor or health care professional. You should make sure you get enough calcium and vitamin D in your diet while you are taking this medicine. Discuss your dietary needs with your health care professional or nutritionist. Exercise may help to prevent bone loss. Discuss your exercise needs with your doctor or health care professional. This medicine can rarely cause blood clots. You should avoid long periods of bed rest while taking this medicine. If you are going to have surgery, tell your doctor or health care professional that you are taking this medicine. This medicine should be stopped at least 3 days before surgery. After surgery, it should be restarted only after you are walking again. It should not be restarted while you still need long periods of bed rest. You should not smoke while taking this medicine. Smoking may also increase your risk of blood clots. Smoking can also decrease the effects of this medicine. This medicine does not prevent hot flashes. It may cause hot flashes in some patients at the start of therapy. What side effects may I notice from receiving this medicine? Side effects that you should report to your doctor or health care professional as soon as possible: -change in vision -chest pain -difficulty breathing -leg pain or swelling -skin rash, itching Side effects that usually do not require medical attention (report to your doctor or health care professional if they continue or are bothersome): -fluid build-up -leg cramps -stomach pain -sweating This list may not  describe all possible side effects. Call your doctor for medical advice about side effects. You may report side effects to FDA at 1-800-FDA-1088. Where should I keep my medicine? Keep out of the reach of children. Store at room  temperature between 15 and 30 degrees C (59 and 86 degrees F). Throw away any unused medicine after the expiration date. NOTE: This sheet is a summary. It may not cover all possible information. If you have questions about this medicine, talk to your doctor, pharmacist, or health care provider.  2015, Elsevier/Gold Standard. (2008-10-28 15:15:14)

## 2014-10-16 DIAGNOSIS — Z299 Encounter for prophylactic measures, unspecified: Secondary | ICD-10-CM | POA: Insufficient documentation

## 2014-10-16 NOTE — Progress Notes (Signed)
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 Several years ago, she had biopsies from the right breast that came back negative for malignancy. Recently, screening mammogram detected abnormalities on the right breast. On 09/28/2013, she had biopsy done that came back grade 1-2 DCIS, 100% he surgery receptor positive and 94% progesterone receptor positive. She was subsequently recommended surgery. On 11/09/2013, Dr. Ninfa Linden performed right breast lumpectomy with sentinel lymph node biopsy. According to the report, high grade DCIS was found and the tumor was estimated at 1.9 cm in size with negative margin. None of the sentinel lymph nodes were involved. She underwent radiation therapy from February to 53/66/4403 without complications. She has family history of Breast cancer involving her sister. One other sister had colon cancer and her father had pancreatic cancer  INTERVAL HISTORY: Please see below for problem oriented charting. She feels well. She denies any recent abnormal breast examination, palpable mass, abnormal breast appearance or nipple changes   REVIEW OF SYSTEMS:   Constitutional: Denies fevers, chills or abnormal weight loss Eyes: Denies blurriness of vision Ears, nose, mouth, throat, and face: Denies mucositis or sore throat Respiratory: Denies cough, dyspnea or wheezes Cardiovascular: Denies palpitation, chest discomfort or lower extremity swelling Gastrointestinal:  Denies nausea, heartburn or change in bowel habits Skin: Denies abnormal skin rashes Lymphatics: Denies new lymphadenopathy or easy bruising Neurological:Denies numbness, tingling or new  weaknesses Behavioral/Psych: Mood is stable, no new changes  All other systems were reviewed with the patient and are negative.  I have reviewed the past medical history, past surgical history, social history and family history with the patient and they are unchanged from previous note.  ALLERGIES:  has No Known Allergies.  MEDICATIONS:  Current Outpatient Prescriptions  Medication Sig Dispense Refill  . albuterol (PROVENTIL HFA;VENTOLIN HFA) 108 (90 BASE) MCG/ACT inhaler Inhale 2 puffs into the lungs 3 (three) times daily. Also use the inhaler every 2 hours if needed for worsening shortness of breath and wheezing. 1 Inhaler 3  . ALPRAZolam (XANAX) 0.5 MG tablet Take 0.5 mg by mouth at bedtime as needed for anxiety.    Marland Kitchen BREO ELLIPTA 100-25 MCG/INH AEPB   4  . Calcium 600-200 MG-UNIT per tablet Take 1 tablet by mouth daily.    Marland Kitchen econazole nitrate 1 % cream Apply topically 3 (three) times daily as needed.    Marland Kitchen FLUoxetine (PROZAC) 40 MG capsule Take 40 mg by mouth daily.    Marland Kitchen lisinopril (PRINIVIL,ZESTRIL) 10 MG tablet Take 10 mg by mouth daily.    . metoprolol (LOPRESSOR) 50 MG tablet Take 50-75 mg by mouth 2 (two) times daily. Takes 75 mg in the morning and 50 mg in the evening    . omeprazole (PRILOSEC) 20 MG capsule Take 20 mg by mouth 2 (two) times daily.     Marland Kitchen oxyCODONE-acetaminophen (ROXICET) 5-325 MG per tablet Take 1-2 tablets by mouth every 4 (four) hours as needed for severe pain. 40 tablet 0  . traZODone (DESYREL) 50 MG tablet Take 50 mg by mouth at bedtime.    . docusate sodium (COLACE) 100 MG capsule Take 100 mg by mouth daily as needed for mild constipation.    . raloxifene (EVISTA) 60 MG tablet Take 1 tablet (60  mg total) by mouth daily. 30 tablet 6   No current facility-administered medications for this visit.    PHYSICAL EXAMINATION: ECOG PERFORMANCE STATUS: 0 - Asymptomatic  Filed Vitals:   10/15/14 1444  BP: 144/53  Pulse: 75  Temp: 98.2 F (36.8 C)  Resp: 18    Filed Weights   10/15/14 1444  Weight: 192 lb 1.6 oz (87.136 kg)    GENERAL:alert, no distress and comfortable SKIN: skin color, texture, turgor are normal, no rashes or significant lesions EYES: normal, Conjunctiva are pink and non-injected, sclera clear OROPHARYNX:no exudate, no erythema and lips, buccal mucosa, and tongue normal  NECK: supple, thyroid normal size, non-tender, without nodularity LYMPH:  no palpable lymphadenopathy in the cervical, axillary or inguinal LUNGS: clear to auscultation and percussion with normal breathing effort HEART: regular rate & rhythm and no murmurs and no lower extremity edema ABDOMEN:abdomen soft, non-tender and normal bowel sounds Musculoskeletal:no cyanosis of digits and no clubbing  NEURO: alert & oriented x 3 with fluent speech, no focal motor/sensory deficits Well healed right lumpectomy scar. Bilateral breast exams did not reveal other abnormalities LABORATORY DATA:  I have reviewed the data as listed    Component Value Date/Time   NA 142 01/04/2014 0502   K 4.3 01/04/2014 0502   CL 103 01/04/2014 0502   CO2 27 01/04/2014 0502   GLUCOSE 155* 01/04/2014 0502   BUN 13 01/04/2014 0502   CREATININE 0.59 01/04/2014 0502   CALCIUM 9.3 01/04/2014 0502   PROT 7.9 01/03/2014 1525   ALBUMIN 3.7 01/03/2014 1525   AST 16 01/03/2014 1525   ALT 12 01/03/2014 1525   ALKPHOS 88 01/03/2014 1525   BILITOT 0.7 01/03/2014 1525   GFRNONAA >90 01/04/2014 0502   GFRAA >90 01/04/2014 0502    No results found for: SPEP, UPEP  Lab Results  Component Value Date   WBC 8.7 01/04/2014   NEUTROABS 12.8* 01/03/2014   HGB 13.1 01/04/2014   HCT 40.4 01/04/2014   MCV 95.1 01/04/2014   PLT 259 01/04/2014      Chemistry      Component Value Date/Time   NA 142 01/04/2014 0502   K 4.3 01/04/2014 0502   CL 103 01/04/2014 0502   CO2 27 01/04/2014 0502   BUN 13 01/04/2014 0502   CREATININE 0.59 01/04/2014 0502      Component Value Date/Time    CALCIUM 9.3 01/04/2014 0502   ALKPHOS 88 01/03/2014 1525   AST 16 01/03/2014 1525   ALT 12 01/03/2014 1525   BILITOT 0.7 01/03/2014 1525      ASSESSMENT & PLAN:  DCIS (ductal carcinoma in situ) of breast Previously she declined tamoxifen for fear of side-effects and reluctance to switch anti-depressant (tamoxifen will interact with Prozac). I discussed alternative using Evista quoting similar benefits from the STAR trial. Evista will also help with osteoporosis. The risks, benefits and side-effects of Evista is fully discussed and she agreed to proceed. Patient education material is dispensed. She is due to mammogram and I have given her a written order to have it done at home  Osteoporosis Recent DEXA scan confirmed osteoporosis. She is started on Evista, calcium and vitamin D  Tobacco abuse I spent some time counseling the patient the importance of tobacco cessation. She is currently not interested to quit now.   Preventive measure We discussed the importance of preventive care and reviewed the vaccination programs. She does not have any prior allergic reactions to pneumococcal vaccination. She agrees to proceed with  pneumococcal vaccination today and we will administer it today at the clinic.    No orders of the defined types were placed in this encounter.   All questions were answered. The patient knows to call the clinic with any problems, questions or concerns. No barriers to learning was detected. I spent 30 minutes counseling the patient face to face. The total time spent in the appointment was 40 minutes and more than 50% was on counseling and review of test results     [facility], [place], MD 10/16/2014 7:54 AM

## 2014-10-16 NOTE — Assessment & Plan Note (Signed)
Previously she declined tamoxifen for fear of side-effects and reluctance to switch anti-depressant (tamoxifen will interact with Prozac). I discussed alternative using Evista quoting similar benefits from the STAR trial. Evista will also help with osteoporosis. The risks, benefits and side-effects of Evista is fully discussed and she agreed to proceed. Patient education material is dispensed. She is due to mammogram and I have given her a written order to have it done at home

## 2014-10-16 NOTE — Assessment & Plan Note (Signed)
Recent DEXA scan confirmed osteoporosis. She is started on Evista, calcium and vitamin D

## 2014-10-16 NOTE — Assessment & Plan Note (Signed)
We discussed the importance of preventive care and reviewed the vaccination programs. She does not have any prior allergic reactions to pneumococcal vaccination. She agrees to proceed with pneumococcal vaccination today and we will administer it today at the clinic.

## 2014-10-16 NOTE — Assessment & Plan Note (Signed)
I spent some time counseling the patient the importance of tobacco cessation. She is currently not interested to quit now.

## 2015-04-15 ENCOUNTER — Telehealth: Payer: Self-pay | Admitting: Hematology and Oncology

## 2015-04-15 ENCOUNTER — Ambulatory Visit: Payer: Self-pay | Admitting: Hematology and Oncology

## 2015-04-15 NOTE — Telephone Encounter (Signed)
returned call and and advised on 6.17 on available appt in June...advised pt to call back if she wants

## 2015-05-13 ENCOUNTER — Telehealth: Payer: Self-pay | Admitting: Hematology and Oncology

## 2015-05-13 NOTE — Telephone Encounter (Signed)
Pt came in advised she was told she had an apt on 06/17 '@8'$ :70, looking at notes pt was advised there was no availability on that day and pt was going to c/b to r/s... Pt confirmed MD visit, gave pt calendar.... KJ

## 2015-05-18 ENCOUNTER — Encounter: Payer: Self-pay | Admitting: Hematology and Oncology

## 2015-05-18 ENCOUNTER — Ambulatory Visit (HOSPITAL_BASED_OUTPATIENT_CLINIC_OR_DEPARTMENT_OTHER): Payer: Medicare Other | Admitting: Hematology and Oncology

## 2015-05-18 ENCOUNTER — Telehealth: Payer: Self-pay | Admitting: Hematology and Oncology

## 2015-05-18 VITALS — BP 142/52 | HR 68 | Temp 98.2°F | Resp 18 | Ht 63.0 in | Wt 194.4 lb

## 2015-05-18 DIAGNOSIS — Z720 Tobacco use: Secondary | ICD-10-CM

## 2015-05-18 DIAGNOSIS — M810 Age-related osteoporosis without current pathological fracture: Secondary | ICD-10-CM | POA: Diagnosis not present

## 2015-05-18 DIAGNOSIS — D0510 Intraductal carcinoma in situ of unspecified breast: Secondary | ICD-10-CM | POA: Diagnosis not present

## 2015-05-18 MED ORDER — RALOXIFENE HCL 60 MG PO TABS: 60.0000 mg | ORAL_TABLET | Freq: Every day | ORAL | Status: DC

## 2015-05-18 NOTE — Progress Notes (Signed)
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 Several years ago, she had biopsies from the right breast that came back negative for malignancy. Recently, screening mammogram detected abnormalities on the right breast. On 09/28/2013, she had biopsy done that came back grade 1-2 DCIS, 100% he surgery receptor positive and 94% progesterone receptor positive. She was subsequently recommended surgery. On 11/09/2013, Dr. Ninfa Linden performed right breast lumpectomy with sentinel lymph node biopsy. According to the report, high grade DCIS was found and the tumor was estimated at 1.9 cm in size with negative margin. None of the sentinel lymph nodes were involved. She underwent radiation therapy from February to 63/14/9702 without complications. She has family history of Breast cancer involving her sister. One other sister had colon cancer and her father had pancreatic cancer From 10/26/2014, She was started on Evista  INTERVAL HISTORY: Please see below for problem oriented charting. She denies any recent abnormal breast examination, palpable mass, abnormal breast appearance or nipple changes She complained of very minor hot flashes and occasional mood swings. Denies worsening depression.  REVIEW OF SYSTEMS:   Constitutional: Denies fevers, chills or abnormal weight loss Eyes: Denies blurriness of vision Ears, nose, mouth, throat, and face: Denies mucositis or sore throat Respiratory: Denies cough, dyspnea or wheezes Cardiovascular: Denies palpitation, chest discomfort or lower extremity swelling Gastrointestinal:  Denies nausea, heartburn or change in bowel habits Skin:  Denies abnormal skin rashes Lymphatics: Denies new lymphadenopathy or easy bruising Neurological:Denies numbness, tingling or new weaknesses Behavioral/Psych: Mood is stable, no new changes  All other systems were reviewed with the patient and are negative.  I have reviewed the past medical history, past surgical history, social history and family history with the patient and they are unchanged from previous note.  ALLERGIES:  has No Known Allergies.  MEDICATIONS:  Current Outpatient Prescriptions  Medication Sig Dispense Refill  . albuterol (PROVENTIL HFA;VENTOLIN HFA) 108 (90 BASE) MCG/ACT inhaler Inhale 2 puffs into the lungs 3 (three) times daily. Also use the inhaler every 2 hours if needed for worsening shortness of breath and wheezing. 1 Inhaler 3  . ALPRAZolam (XANAX) 0.5 MG tablet Take 0.5 mg by mouth at bedtime as needed for anxiety.    Marland Kitchen BREO ELLIPTA 100-25 MCG/INH AEPB   4  . Calcium 600-200 MG-UNIT per tablet Take 1 tablet by mouth daily.    Marland Kitchen docusate sodium (COLACE) 100 MG capsule Take 100 mg by mouth daily as needed for mild constipation.    Marland Kitchen econazole nitrate 1 % cream Apply topically 3 (three) times daily as needed.    Marland Kitchen FLUoxetine (PROZAC) 40 MG capsule Take 40 mg by mouth daily.    Marland Kitchen lisinopril (PRINIVIL,ZESTRIL) 10 MG tablet Take 10 mg by mouth daily.    . metoprolol (LOPRESSOR) 50 MG tablet Take 50-75 mg by mouth 2 (two) times daily. Takes 75 mg in the morning and 50 mg in the evening    . omeprazole (PRILOSEC) 20 MG capsule Take 20 mg by mouth 2 (two) times daily.     Marland Kitchen oxyCODONE-acetaminophen (ROXICET) 5-325 MG per tablet Take 1-2 tablets by mouth every 4 (four) hours as needed for severe pain. 40 tablet 0  . raloxifene (EVISTA) 60  MG tablet Take 1 tablet (60 mg total) by mouth daily. 90 tablet 6  . traZODone (DESYREL) 50 MG tablet Take 50 mg by mouth at bedtime.     No current facility-administered medications for this visit.    PHYSICAL EXAMINATION: ECOG  PERFORMANCE STATUS: 0 - Asymptomatic  Filed Vitals:   05/18/15 0951  BP: 142/52  Pulse: 68  Temp: 98.2 F (36.8 C)  Resp: 18   Filed Weights   05/18/15 0951  Weight: 194 lb 6.4 oz (88.179 kg)    GENERAL:alert, no distress and comfortable SKIN: skin color, texture, turgor are normal, no rashes or significant lesions EYES: normal, Conjunctiva are pink and non-injected, sclera clear OROPHARYNX:no exudate, no erythema and lips, buccal mucosa, and tongue normal  NECK: supple, thyroid normal size, non-tender, without nodularity LYMPH:  no palpable lymphadenopathy in the cervical, axillary or inguinal LUNGS: clear to auscultation and percussion with normal breathing effort HEART: regular rate & rhythm and no murmurs and no lower extremity edema ABDOMEN:abdomen soft, non-tender and normal bowel sounds Musculoskeletal:no cyanosis of digits and no clubbing  NEURO: alert & oriented x 3 with fluent speech, no focal motor/sensory deficits BREAST: normal breast exam on the left. On the right, well-healed lumpectomy scar with no other abnormalities.  LABORATORY DATA:  I have reviewed the data as listed    Component Value Date/Time   NA 142 01/04/2014 0502   K 4.3 01/04/2014 0502   CL 103 01/04/2014 0502   CO2 27 01/04/2014 0502   GLUCOSE 155* 01/04/2014 0502   BUN 13 01/04/2014 0502   CREATININE 0.59 01/04/2014 0502   CALCIUM 9.3 01/04/2014 0502   PROT 7.9 01/03/2014 1525   ALBUMIN 3.7 01/03/2014 1525   AST 16 01/03/2014 1525   ALT 12 01/03/2014 1525   ALKPHOS 88 01/03/2014 1525   BILITOT 0.7 01/03/2014 1525   GFRNONAA >90 01/04/2014 0502   GFRAA >90 01/04/2014 0502    No results found for: SPEP, UPEP  Lab Results  Component Value Date   WBC 8.7 01/04/2014   NEUTROABS 12.8* 01/03/2014   HGB 13.1 01/04/2014   HCT 40.4 01/04/2014   MCV 95.1 01/04/2014   PLT 259 01/04/2014      Chemistry      Component Value Date/Time   NA 142 01/04/2014 0502   K 4.3 01/04/2014 0502    CL 103 01/04/2014 0502   CO2 27 01/04/2014 0502   BUN 13 01/04/2014 0502   CREATININE 0.59 01/04/2014 0502      Component Value Date/Time   CALCIUM 9.3 01/04/2014 0502   ALKPHOS 88 01/03/2014 1525   AST 16 01/03/2014 1525   ALT 12 01/03/2014 1525   BILITOT 0.7 01/03/2014 1525      ASSESSMENT & PLAN:  DCIS (ductal carcinoma in situ) of breast  She tolerated Evista very well with minor side effects. I reinforced the importance of annual mammogram. I will see her back next year for further review  Osteoporosis Recent DEXA scan in April 2015 confirmed osteoporosis. She is started on Evista, calcium and vitamin D    Tobacco abuse I spent some time counseling the patient the importance of tobacco cessation. she is currently attempting to quit on her own   No orders of the defined types were placed in this encounter.   All questions were answered. The patient knows to call the clinic with any problems, questions or concerns. No barriers to learning was detected. I spent 15 minutes counseling the patient face to  face. The total time spent in the appointment was 20 minutes and more than 50% was on counseling and review of test results     [facility], Lucine Bilski, MD 05/18/2015 10:01 AM

## 2015-05-18 NOTE — Telephone Encounter (Signed)
Appointments made and calendar mailed to patient

## 2015-05-18 NOTE — Assessment & Plan Note (Signed)
I spent some time counseling the patient the importance of tobacco cessation. she is currently attempting to quit on her own 

## 2015-05-18 NOTE — Assessment & Plan Note (Signed)
She tolerated Evista very well with minor side effects. I reinforced the importance of annual mammogram. I will see her back next year for further review

## 2015-05-18 NOTE — Assessment & Plan Note (Signed)
Recent DEXA scan in April 2015 confirmed osteoporosis. She is started on Evista, calcium and vitamin D

## 2016-02-21 ENCOUNTER — Other Ambulatory Visit: Payer: Self-pay | Admitting: Orthopedic Surgery

## 2016-02-21 DIAGNOSIS — M25551 Pain in right hip: Secondary | ICD-10-CM

## 2016-02-27 ENCOUNTER — Ambulatory Visit
Admission: RE | Admit: 2016-02-27 | Discharge: 2016-02-27 | Disposition: A | Discharge status: Home / Self Care | Payer: Medicare Other | Source: Ambulatory Visit | Attending: Orthopedic Surgery | Admitting: Orthopedic Surgery

## 2016-02-27 DIAGNOSIS — M25551 Pain in right hip: Secondary | ICD-10-CM

## 2016-02-27 MED ORDER — METHYLPREDNISOLONE ACETATE 40 MG/ML INJ SUSP (RADIOLOG
120.0000 mg | Freq: Once | INTRAMUSCULAR | Status: AC
  Administered 2016-02-27: 120 mg via INTRA_ARTICULAR

## 2016-02-27 MED ORDER — IOHEXOL 180 MG/ML  SOLN
1.0000 mL | Freq: Once | INTRAMUSCULAR | Status: AC | PRN
  Administered 2016-02-27: 1 mL via INTRA_ARTICULAR

## 2016-03-16 ENCOUNTER — Telehealth: Payer: Self-pay | Admitting: Hematology and Oncology

## 2016-03-16 NOTE — Telephone Encounter (Signed)
pt called to cx appt...done... °

## 2016-03-20 ENCOUNTER — Ambulatory Visit: Payer: Self-pay | Admitting: Hematology and Oncology

## 2016-11-25 ENCOUNTER — Encounter (HOSPITAL_COMMUNITY): Payer: Self-pay | Admitting: Emergency Medicine

## 2016-11-25 ENCOUNTER — Emergency Department (HOSPITAL_COMMUNITY): Payer: Medicare Other

## 2016-11-25 ENCOUNTER — Inpatient Hospital Stay (HOSPITAL_COMMUNITY)
Admission: EM | Admit: 2016-11-25 | Discharge: 2016-11-27 | DRG: 190 | Disposition: A | Discharge status: Home / Self Care | Payer: Medicare Other | Attending: Internal Medicine | Admitting: Internal Medicine

## 2016-11-25 DIAGNOSIS — F329 Major depressive disorder, single episode, unspecified: Secondary | ICD-10-CM | POA: Diagnosis present

## 2016-11-25 DIAGNOSIS — M810 Age-related osteoporosis without current pathological fracture: Secondary | ICD-10-CM | POA: Diagnosis present

## 2016-11-25 DIAGNOSIS — K219 Gastro-esophageal reflux disease without esophagitis: Secondary | ICD-10-CM | POA: Diagnosis present

## 2016-11-25 DIAGNOSIS — I10 Essential (primary) hypertension: Secondary | ICD-10-CM | POA: Diagnosis present

## 2016-11-25 DIAGNOSIS — E785 Hyperlipidemia, unspecified: Secondary | ICD-10-CM | POA: Diagnosis present

## 2016-11-25 DIAGNOSIS — R739 Hyperglycemia, unspecified: Secondary | ICD-10-CM | POA: Diagnosis present

## 2016-11-25 DIAGNOSIS — F419 Anxiety disorder, unspecified: Secondary | ICD-10-CM | POA: Diagnosis present

## 2016-11-25 DIAGNOSIS — J441 Chronic obstructive pulmonary disease with (acute) exacerbation: Secondary | ICD-10-CM | POA: Diagnosis not present

## 2016-11-25 DIAGNOSIS — Z853 Personal history of malignant neoplasm of breast: Secondary | ICD-10-CM

## 2016-11-25 DIAGNOSIS — R000 Tachycardia, unspecified: Secondary | ICD-10-CM | POA: Diagnosis present

## 2016-11-25 DIAGNOSIS — Z79899 Other long term (current) drug therapy: Secondary | ICD-10-CM

## 2016-11-25 DIAGNOSIS — Z803 Family history of malignant neoplasm of breast: Secondary | ICD-10-CM | POA: Diagnosis not present

## 2016-11-25 DIAGNOSIS — J9601 Acute respiratory failure with hypoxia: Secondary | ICD-10-CM | POA: Diagnosis present

## 2016-11-25 DIAGNOSIS — F17210 Nicotine dependence, cigarettes, uncomplicated: Secondary | ICD-10-CM | POA: Diagnosis present

## 2016-11-25 DIAGNOSIS — E876 Hypokalemia: Secondary | ICD-10-CM

## 2016-11-25 DIAGNOSIS — R0602 Shortness of breath: Secondary | ICD-10-CM | POA: Diagnosis present

## 2016-11-25 LAB — BASIC METABOLIC PANEL
Anion gap: 8 (ref 5–15)
BUN: 8 mg/dL (ref 6–20)
CO2: 29 mmol/L (ref 22–32)
Calcium: 9 mg/dL (ref 8.9–10.3)
Chloride: 100 mmol/L — ABNORMAL LOW (ref 101–111)
Creatinine, Ser: 0.61 mg/dL (ref 0.44–1.00)
GFR calc Af Amer: >60 mL/min (ref >60)
GLUCOSE: 128 mg/dL — AB (ref 65–99)
POTASSIUM: 3.4 mmol/L — AB (ref 3.5–5.1)
Sodium: 137 mmol/L (ref 135–145)

## 2016-11-25 LAB — CBC
HEMATOCRIT: 46.3 % — AB (ref 36.0–46.0)
HEMOGLOBIN: 14.7 g/dL (ref 12.0–15.0)
MCH: 30.5 pg (ref 26.0–34.0)
MCHC: 31.7 g/dL (ref 30.0–36.0)
MCV: 96.1 fL (ref 78.0–100.0)
Platelets: 282 10*3/uL (ref 150–400)
RBC: 4.82 MIL/uL (ref 3.87–5.11)
RDW: 13.1 % (ref 11.5–15.5)
WBC: 9.5 10*3/uL (ref 4.0–10.5)

## 2016-11-25 LAB — TROPONIN I: Troponin I: <0.03 ng/mL (ref <0.03)

## 2016-11-25 MED ORDER — ALBUTEROL SULFATE (2.5 MG/3ML) 0.083% IN NEBU
2.5000 mg | INHALATION_SOLUTION | Freq: Once | RESPIRATORY_TRACT | Status: AC
  Administered 2016-11-25: 2.5 mg via RESPIRATORY_TRACT
  Filled 2016-11-25: qty 3

## 2016-11-25 MED ORDER — ACETAMINOPHEN 650 MG RE SUPP: 650.0000 mg | Freq: Four times a day (QID) | RECTAL | Status: DC | PRN

## 2016-11-25 MED ORDER — LISINOPRIL 10 MG PO TABS
10.0000 mg | ORAL_TABLET | Freq: Every day | ORAL | Status: DC
  Administered 2016-11-26 – 2016-11-27 (×2): 10 mg via ORAL
  Filled 2016-11-25 (×2): qty 1

## 2016-11-25 MED ORDER — TRAZODONE HCL 50 MG PO TABS
50.0000 mg | ORAL_TABLET | Freq: Every day | ORAL | Status: DC
  Administered 2016-11-25 – 2016-11-26 (×2): 50 mg via ORAL
  Filled 2016-11-25 (×2): qty 1

## 2016-11-25 MED ORDER — ENOXAPARIN SODIUM 40 MG/0.4ML ~~LOC~~ SOLN
40.0000 mg | SUBCUTANEOUS | Status: DC
  Administered 2016-11-26 – 2016-11-27 (×2): 40 mg via SUBCUTANEOUS
  Filled 2016-11-25 (×2): qty 0.4

## 2016-11-25 MED ORDER — CALCIUM CARBONATE-VITAMIN D 500-200 MG-UNIT PO TABS
ORAL_TABLET | Freq: Every day | ORAL | Status: DC
  Administered 2016-11-26 – 2016-11-27 (×2): 1 via ORAL
  Filled 2016-11-25 (×2): qty 1

## 2016-11-25 MED ORDER — ALBUTEROL SULFATE (2.5 MG/3ML) 0.083% IN NEBU
2.5000 mg | INHALATION_SOLUTION | Freq: Four times a day (QID) | RESPIRATORY_TRACT | Status: DC | PRN
  Filled 2016-11-25: qty 3

## 2016-11-25 MED ORDER — METHYLPREDNISOLONE SODIUM SUCC 125 MG IJ SOLR
125.0000 mg | Freq: Once | INTRAMUSCULAR | Status: AC
  Administered 2016-11-25: 125 mg via INTRAVENOUS
  Filled 2016-11-25: qty 2

## 2016-11-25 MED ORDER — ALBUTEROL SULFATE (2.5 MG/3ML) 0.083% IN NEBU
2.5000 mg | INHALATION_SOLUTION | Freq: Four times a day (QID) | RESPIRATORY_TRACT | Status: DC
  Administered 2016-11-25 – 2016-11-26 (×5): 2.5 mg via RESPIRATORY_TRACT
  Filled 2016-11-25 (×4): qty 3

## 2016-11-25 MED ORDER — IPRATROPIUM-ALBUTEROL 0.5-2.5 (3) MG/3ML IN SOLN
3.0000 mL | Freq: Once | RESPIRATORY_TRACT | Status: AC
  Administered 2016-11-25: 3 mL via RESPIRATORY_TRACT
  Filled 2016-11-25: qty 3

## 2016-11-25 MED ORDER — PANTOPRAZOLE SODIUM 40 MG PO TBEC
40.0000 mg | DELAYED_RELEASE_TABLET | Freq: Every day | ORAL | Status: DC
  Administered 2016-11-26 – 2016-11-27 (×2): 40 mg via ORAL
  Filled 2016-11-25 (×2): qty 1

## 2016-11-25 MED ORDER — POTASSIUM CHLORIDE CRYS ER 20 MEQ PO TBCR
20.0000 meq | EXTENDED_RELEASE_TABLET | Freq: Once | ORAL | Status: AC
  Administered 2016-11-25: 20 meq via ORAL
  Filled 2016-11-25: qty 1

## 2016-11-25 MED ORDER — UMECLIDINIUM BROMIDE 62.5 MCG/INH IN AEPB
1.0000 | INHALATION_SPRAY | Freq: Every day | RESPIRATORY_TRACT | Status: DC
  Administered 2016-11-26 – 2016-11-27 (×2): 1 via RESPIRATORY_TRACT
  Filled 2016-11-25: qty 7

## 2016-11-25 MED ORDER — METOPROLOL TARTRATE 50 MG PO TABS
75.0000 mg | ORAL_TABLET | Freq: Every day | ORAL | Status: DC
  Administered 2016-11-26 – 2016-11-27 (×2): 75 mg via ORAL
  Filled 2016-11-25 (×2): qty 1

## 2016-11-25 MED ORDER — ACETAMINOPHEN 325 MG PO TABS
650.0000 mg | ORAL_TABLET | Freq: Four times a day (QID) | ORAL | Status: DC | PRN
Start: 2016-11-25 — End: 2016-11-27
  Administered 2016-11-26: 650 mg via ORAL
  Filled 2016-11-25: qty 2

## 2016-11-25 MED ORDER — FLUOXETINE HCL 20 MG PO CAPS
40.0000 mg | ORAL_CAPSULE | Freq: Every day | ORAL | Status: DC
  Administered 2016-11-26 – 2016-11-27 (×2): 40 mg via ORAL
  Filled 2016-11-25 (×2): qty 2

## 2016-11-25 MED ORDER — NICOTINE 21 MG/24HR TD PT24
21.0000 mg | MEDICATED_PATCH | Freq: Every day | TRANSDERMAL | Status: DC | PRN
  Filled 2016-11-25: qty 1

## 2016-11-25 MED ORDER — OXYCODONE-ACETAMINOPHEN 5-325 MG PO TABS
1.0000 | ORAL_TABLET | ORAL | Status: DC | PRN
  Administered 2016-11-25: 2 via ORAL
  Administered 2016-11-26: 1 via ORAL
  Filled 2016-11-25: qty 1
  Filled 2016-11-25: qty 2

## 2016-11-25 MED ORDER — METHYLPREDNISOLONE SODIUM SUCC 125 MG IJ SOLR
80.0000 mg | Freq: Three times a day (TID) | INTRAMUSCULAR | Status: DC
  Administered 2016-11-25 – 2016-11-27 (×5): 80 mg via INTRAVENOUS
  Filled 2016-11-25 (×5): qty 2

## 2016-11-25 MED ORDER — DEXTROSE 5 % IV SOLN
500.0000 mg | INTRAVENOUS | Status: DC
  Administered 2016-11-25 – 2016-11-26 (×2): 500 mg via INTRAVENOUS
  Filled 2016-11-25 (×3): qty 500

## 2016-11-25 MED ORDER — DOCUSATE SODIUM 100 MG PO CAPS: 100.0000 mg | ORAL_CAPSULE | Freq: Every day | ORAL | Status: DC | PRN

## 2016-11-25 MED ORDER — AZITHROMYCIN 500 MG IV SOLR
INTRAVENOUS | Status: AC
  Filled 2016-11-25: qty 500

## 2016-11-25 MED ORDER — METOPROLOL TARTRATE 50 MG PO TABS
50.0000 mg | ORAL_TABLET | Freq: Every day | ORAL | Status: DC
  Administered 2016-11-25 – 2016-11-26 (×2): 50 mg via ORAL
  Filled 2016-11-25 (×2): qty 1

## 2016-11-25 MED ORDER — ALPRAZOLAM 0.5 MG PO TABS
0.5000 mg | ORAL_TABLET | Freq: Every evening | ORAL | Status: DC | PRN
  Administered 2016-11-25 – 2016-11-26 (×2): 0.5 mg via ORAL
  Filled 2016-11-25 (×2): qty 1

## 2016-11-25 NOTE — ED Notes (Signed)
Pt placed on 2L 02 [place] for 02 sats 90%.

## 2016-11-25 NOTE — ED Provider Notes (Signed)
[facility] DEPT Provider Note   CSN: [id number] Arrival date & time: 11/25/16  1450     History   Chief Complaint Chief Complaint  Patient presents with  . Shortness of Breath    HPI Laura Abbott is a 70 y.o. female.  Patient complains of shortness of breath and wheezing for a few days. Also call for congestion   The history is provided by the patient. No language interpreter was used.  Shortness of Breath  This is a new problem. The problem occurs continuously.The current episode started more than 2 days ago. The problem has not changed since onset.Associated symptoms include wheezing. Pertinent negatives include no fever, no headaches, no cough, no chest pain, no abdominal pain and no rash. It is unknown what precipitated the problem. Risk factors: copd. She has tried nothing for the symptoms. The treatment provided no relief. She has had no prior ED visits.    Past Medical History:  Diagnosis Date  . Anxiety   . Arthritis   . Asthma   . Breast cancer ([place])    breast, right DCIS  . COPD (chronic obstructive pulmonary disease) ([place])   . COPD exacerbation ([place]) 01/03/2014   With hypoxia  . Depression   . Emphysema of lung ([place])   . GERD (gastroesophageal reflux disease)   . Hyperlipidemia   . Hypertension   . Obesity 01/05/2014  . Osteoporosis   . Osteoporosis, unspecified 12/01/2013  . Tobacco abuse 12/01/2013    Patient Active Problem List   Diagnosis Date Noted  . Preventive measure 10/16/2014  . Obesity 01/05/2014  . Bronchitis 01/03/2014  . COPD exacerbation ([place]) 01/03/2014  . Acute respiratory failure with hypoxia ([place]) 01/03/2014  . Hyperglycemia 01/03/2014  . Hypertension 01/03/2014  . Osteoporosis 12/01/2013  . Tobacco abuse 12/01/2013  . 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 Absence of bladder continence 12/31/2011  . FOM (frequency of micturition) 12/31/2011  . H/O urinary tract infection 12/31/2011  . Blood in the urine  12/31/2011    Past Surgical History:  Procedure Laterality Date  . ABDOMINAL HYSTERECTOMY    . BLADDER SURGERY     had tacked  . BREAST LUMPECTOMY WITH NEEDLE LOCALIZATION AND AXILLARY SENTINEL LYMPH NODE BX Right 11/09/2013   Procedure: BREAST LUMPECTOMY WITH NEEDLE LOCALIZATION AND AXILLARY SENTINEL LYMPH NODE BIOPSY;  Surgeon: Harl Bowie, MD;  Location: [facility];  Service: General;  Laterality: Right;  . BREAST SURGERY     lumpectomy  . CHOLECYSTECTOMY      OB History    No data available       Home Medications    Prior to Admission medications   Medication Sig Start Date End Date Taking? Authorizing Provider  albuterol (PROVENTIL HFA;VENTOLIN HFA) 108 (90 BASE) MCG/ACT inhaler Inhale 2 puffs into the lungs 3 (three) times daily. Also use the inhaler every 2 hours if needed for worsening shortness of breath and wheezing. 01/05/14  Yes Rexene Alberts, MD  ALPRAZolam Duanne Moron) 0.5 MG tablet Take 0.5 mg by mouth at bedtime as needed for anxiety.   Yes Historical Provider, MD  Calcium 600-200 MG-UNIT per tablet Take 1 tablet by mouth daily.   Yes Historical Provider, MD  docusate sodium (COLACE) 100 MG capsule Take 100 mg by mouth daily as needed for mild constipation.   Yes Historical Provider, MD  econazole nitrate 1 % cream Apply topically 3 (three) times daily as needed. 02/16/14  Yes Historical Provider, MD  FLUoxetine (PROZAC)  40 MG capsule Take 40 mg by mouth daily.   Yes Historical Provider, MD  lisinopril (PRINIVIL,ZESTRIL) 10 MG tablet Take 10 mg by mouth daily.   Yes Historical Provider, MD  metoprolol (LOPRESSOR) 50 MG tablet Take 50-75 mg by mouth 2 (two) times daily. Takes 75 mg in the morning and 50 mg in the evening   Yes Historical Provider, MD  omeprazole (PRILOSEC) 20 MG capsule Take 20 mg by mouth 2 (two) times daily.  09/03/13  Yes Historical Provider, MD  oxyCODONE-acetaminophen (ROXICET) 5-325 MG per tablet Take 1-2 tablets by mouth every 4 (four) hours as  needed for severe pain. 11/09/13  Yes Coralie Keens, MD  traZODone (DESYREL) 50 MG tablet Take 50 mg by mouth at bedtime.   Yes Historical Provider, MD    Family History Family History  Problem Relation Age of Onset  . Cancer Mother     lung  . Cancer Father     pancreatic  . Cancer Sister     leukemia  . Cancer Sister     liver  . Cancer Sister     colon  . Cancer Sister     BREAST    Social History Social History  Substance Use Topics  . Smoking status: Current Every Day Smoker    Packs/day: 1.00    Years: 35.00  . Smokeless tobacco: Never Used  . Alcohol use No     Allergies   Patient has no known allergies.   Review of Systems Review of Systems  Constitutional: Negative for appetite change, fatigue and fever.  HENT: Negative for congestion, ear discharge and sinus pressure.   Eyes: Negative for discharge.  Respiratory: Positive for shortness of breath and wheezing. Negative for cough.   Cardiovascular: Negative for chest pain.  Gastrointestinal: Negative for abdominal pain and diarrhea.  Genitourinary: Negative for frequency and hematuria.  Musculoskeletal: Negative for back pain.  Skin: Negative for rash.  Neurological: Negative for seizures and headaches.  Psychiatric/Behavioral: Negative for hallucinations.     Physical Exam Updated Vital Signs BP 158/61   Pulse 103   Temp 98.9 F (37.2 C) (Oral)   Resp (!) 27   Ht '5\' 3"'$  (1.6 m)   Wt 186 lb (84.4 kg)   SpO2 92%   BMI 32.95 kg/m   Physical Exam  Constitutional: She is oriented to person, place, and time. She appears well-developed.  HENT:  Head: Normocephalic.  Eyes: Conjunctivae and EOM are normal. No scleral icterus.  Neck: Neck supple. No thyromegaly present.  Cardiovascular: Normal rate and regular rhythm.  Exam reveals no gallop and no friction rub.   No murmur heard. Pulmonary/Chest: No stridor. She has wheezes. She has no rales. She exhibits no tenderness.  Abdominal: She  exhibits no distension. There is no tenderness. There is no rebound.  Musculoskeletal: Normal range of motion. She exhibits no edema.  Lymphadenopathy:    She has no cervical adenopathy.  Neurological: She is oriented to person, place, and time. She exhibits normal muscle tone. Coordination normal.  Skin: No rash noted. No erythema.  Psychiatric: She has a normal mood and affect. Her behavior is normal.     ED Treatments / Results  Labs (all labs ordered are listed, but only abnormal results are displayed) Labs Reviewed  BASIC METABOLIC PANEL - Abnormal; Notable for the following:       Result Value   Potassium 3.4 (*)    Chloride 100 (*)    Glucose, Bld 128 (*)  All other components within normal limits  CBC - Abnormal; Notable for the following:    HCT 46.3 (*)    All other components within normal limits  TROPONIN I    EKG  EKG Interpretation None       Radiology Dg Chest 2 View  Result Date: 11/25/2016 CLINICAL DATA:  Shortness of breath, wheezing, cough for 3 days EXAM: CHEST  2 VIEW COMPARISON:  01/03/2014 FINDINGS: Heart and mediastinal contours are within normal limits. No focal opacities or effusions. No acute bony abnormality. IMPRESSION: No active cardiopulmonary disease. Electronically Signed   By: Rolm Baptise M.D.   On: 11/25/2016 15:47    Procedures Procedures (including critical care time)  Medications Ordered in ED Medications  methylPREDNISolone sodium succinate (SOLU-MEDROL) 125 mg/2 mL injection 125 mg (125 mg Intravenous Given 11/25/16 1552)  ipratropium-albuterol (DUONEB) 0.5-2.5 (3) MG/3ML nebulizer solution 3 mL (3 mLs Nebulization Given 11/25/16 1609)  albuterol (PROVENTIL) (2.5 MG/3ML) 0.083% nebulizer solution 2.5 mg (2.5 mg Nebulization Given 11/25/16 1609)  ipratropium-albuterol (DUONEB) 0.5-2.5 (3) MG/3ML nebulizer solution 3 mL (3 mLs Nebulization Given 11/25/16 1842)  albuterol (PROVENTIL) (2.5 MG/3ML) 0.083% nebulizer solution 2.5 mg  (2.5 mg Nebulization Given 11/25/16 1842)     Initial Impression / Assessment and Plan / ED Course  I have reviewed the triage vital signs and the nursing notes.  Pertinent labs & imaging results that were available during my care of the patient were reviewed by me and considered in my medical decision making (see chart for details).  Clinical Course     Patient with exacerbation of COPD. Patient will be admitted for observation and treatment of the COPD Final Clinical Impressions(s) / ED Diagnoses   Final diagnoses:  COPD exacerbation ([place])    New Prescriptions New Prescriptions   No medications on file     Milton Ferguson, MD 11/25/16 1956

## 2016-11-25 NOTE — ED Notes (Signed)
Pt ambulated. At rest, pt O2 at %93. While ambulating, pt O2 dropped to %89. After ambulating, pts at rest O2 came back up to %92. Dr. Roderic Palau and RN notified.

## 2016-11-25 NOTE — H&P (Addendum)
TRH H&P   Patient Demographics:    Laura Abbott, is a 70 y.o. female  MRN: [id number]   DOB - [birth year]  Admit Date - 11/25/2016  Outpatient Primary MD for the patient is Glenda Chroman, MD  Referring MD/NP/PA:  Milton Ferguson  Outpatient Specialists:   Patient coming from: home  Chief Complaint  Patient presents with  . Shortness of Breath      HPI:    Laura Abbott  is a 70 y.o. female, w/ Copd not on home o2 who apparently c/o cough w yellow sputum x4 days.  Slight dyspnea, wheezing. Denies fever, chills, cp, palp, n/v, brbpr, black stool.  Pox 87% at home today and therefore presented to ED.    In ED, CXR negative for acute process.  Pt mildly hypokalemic. Pt received albuterol neb x2, and solumedrol in the ED, apparently still wheezing.  Pt will be admitted for Copd exacerbation.    Review of systems:    In addition to the HPI above,  No Fever-chills, No Headache, No changes with Vision or hearing, No problems swallowing food or Liquids, No Chest pain, No Abdominal pain, No Nausea or Vommitting, Bowel movements are regular, No Blood in stool or Urine, No dysuria, No new skin rashes or bruises, No new joints pains-aches,  No new weakness, tingling, numbness in any extremity, No recent weight gain or loss, No polyuria, polydypsia or polyphagia, No significant Mental Stressors.  A full 10 point Review of Systems was done, except as stated above, all other Review of Systems were negative.   With Past History of the following :    Past Medical History:  Diagnosis Date  . Anxiety   . Arthritis   . Asthma   . 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 COPD (chronic obstructive pulmonary disease) ([place])   . COPD exacerbation ([place]) 01/03/2014   With hypoxia  . Depression   . Emphysema of lung ([place])   . GERD (gastroesophageal reflux disease)   . Hyperlipidemia     . Hypertension   . Obesity 01/05/2014  . Osteoporosis   . Osteoporosis, unspecified 12/01/2013  . Tobacco abuse 12/01/2013      Past Surgical History:  Procedure Laterality Date  . ABDOMINAL HYSTERECTOMY    . BLADDER SURGERY     had tacked  . BREAST LUMPECTOMY WITH NEEDLE LOCALIZATION AND AXILLARY SENTINEL LYMPH NODE BX Right 11/09/2013   Procedure: BREAST LUMPECTOMY WITH NEEDLE LOCALIZATION AND AXILLARY SENTINEL LYMPH NODE BIOPSY;  Surgeon: Harl Bowie, MD;  Location: [facility];  Service: General;  Laterality: Right;  . BREAST SURGERY     lumpectomy  . CHOLECYSTECTOMY        Social History:     Social History  Substance Use Topics  . Smoking status: Current Every Day Smoker    Packs/day: 1.00    Years:  35.00    Types: Cigarettes  . Smokeless tobacco: Never Used  . Alcohol use No     Lives - at home  Mobility -  Ambulates without assistance.    Family History :     Family History  Problem Relation Age of Onset  . Cancer Mother     lung  . Cancer Father     pancreatic  . Cancer Sister     leukemia  . Cancer Sister     liver  . Cancer Sister     colon  . Cancer Sister     BREAST      Home Medications:   Prior to Admission medications   Medication Sig Start Date End Date Taking? Authorizing Provider  albuterol (PROVENTIL HFA;VENTOLIN HFA) 108 (90 BASE) MCG/ACT inhaler Inhale 2 puffs into the lungs 3 (three) times daily. Also use the inhaler every 2 hours if needed for worsening shortness of breath and wheezing. 01/05/14  Yes Rexene Alberts, MD  ALPRAZolam Duanne Moron) 0.5 MG tablet Take 0.5 mg by mouth at bedtime as needed for anxiety.   Yes Historical Provider, MD  Calcium 600-200 MG-UNIT per tablet Take 1 tablet by mouth daily.   Yes Historical Provider, MD  docusate sodium (COLACE) 100 MG capsule Take 100 mg by mouth daily as needed for mild constipation.   Yes Historical Provider, MD  econazole nitrate 1 % cream Apply topically 3 (three) times daily as  needed. 02/16/14  Yes Historical Provider, MD  FLUoxetine (PROZAC) 40 MG capsule Take 40 mg by mouth daily.   Yes Historical Provider, MD  lisinopril (PRINIVIL,ZESTRIL) 10 MG tablet Take 10 mg by mouth daily.   Yes Historical Provider, MD  metoprolol (LOPRESSOR) 50 MG tablet Take 50-75 mg by mouth 2 (two) times daily. Takes 75 mg in the morning and 50 mg in the evening   Yes Historical Provider, MD  omeprazole (PRILOSEC) 20 MG capsule Take 20 mg by mouth 2 (two) times daily.  09/03/13  Yes Historical Provider, MD  oxyCODONE-acetaminophen (ROXICET) 5-325 MG per tablet Take 1-2 tablets by mouth every 4 (four) hours as needed for severe pain. 11/09/13  Yes Coralie Keens, MD  traZODone (DESYREL) 50 MG tablet Take 50 mg by mouth at bedtime.   Yes Historical Provider, MD     Allergies:    No Known Allergies   Physical Exam:   Vitals  Blood pressure 148/98, pulse 105, temperature 98.9 F (37.2 C), temperature source Oral, resp. rate 22, height '5\' 3"'$  (1.6 m), weight 84.4 kg (186 lb), SpO2 93 %.   1. General  lying in bed in NAD,    2. Normal affect and insight, Not Suicidal or Homicidal, Awake Alert, Oriented X 3.  3. No F.N deficits, ALL C.Nerves Intact, Strength 5/5 all 4 extremities, Sensation intact all 4 extremities, Plantars down going.  4. Ears and Eyes appear Normal, Conjunctivae clear, PERRLA. Moist Oral Mucosa.  5. Supple Neck, No JVD, No cervical lymphadenopathy appriciated, No Carotid Bruits.  6. Symmetrical Chest wall movement, + bilateral exp wheezing, no crackles.    7. Borderline tachycardic s1, s2, , No Gallops, Rubs or Murmurs, No Parasternal Heave.  8. Positive Bowel Sounds, Abdomen Soft, No tenderness, No organomegaly appriciated,No rebound -guarding or rigidity.  9.  No Cyanosis, Normal Skin Turgor, No Skin Rash or Bruise.  10. Good muscle tone,  joints appear normal , no effusions, Normal ROM.  11. No Palpable Lymph Nodes in Neck or Axillae  Data Review:     CBC  Recent Labs Lab 11/25/16 1515  WBC 9.5  HGB 14.7  HCT 46.3*  PLT 282  MCV 96.1  MCH 30.5  MCHC 31.7  RDW 13.1   ------------------------------------------------------------------------------------------------------------------  Chemistries   Recent Labs Lab 11/25/16 1515  NA 137  K 3.4*  CL 100*  CO2 29  GLUCOSE 128*  BUN 8  CREATININE 0.61  CALCIUM 9.0   ------------------------------------------------------------------------------------------------------------------ estimated creatinine clearance is 68.3 mL/min (by C-G formula based on SCr of 0.61 mg/dL). ------------------------------------------------------------------------------------------------------------------ No results for input(s): TSH, T4TOTAL, T3FREE, THYROIDAB in the last 72 hours.  Invalid input(s): FREET3  Coagulation profile No results for input(s): INR, PROTIME in the last 168 hours. ------------------------------------------------------------------------------------------------------------------- No results for input(s): DDIMER in the last 72 hours. -------------------------------------------------------------------------------------------------------------------  Cardiac Enzymes  Recent Labs Lab 11/25/16 1515  TROPONINI <0.03   ------------------------------------------------------------------------------------------------------------------ No results found for: BNP   ---------------------------------------------------------------------------------------------------------------  Urinalysis No results found for: COLORURINE, APPEARANCEUR, LABSPEC, [facility], GLUCOSEU, HGBUR, BILIRUBINUR, KETONESUR, PROTEINUR, UROBILINOGEN, NITRITE, LEUKOCYTESUR  ----------------------------------------------------------------------------------------------------------------   Imaging Results:    Dg Chest 2 View  Result Date: 11/25/2016 CLINICAL DATA:  Shortness of breath, wheezing, cough for  3 days EXAM: CHEST  2 VIEW COMPARISON:  01/03/2014 FINDINGS: Heart and mediastinal contours are within normal limits. No focal opacities or effusions. No acute bony abnormality. IMPRESSION: No active cardiopulmonary disease. Electronically Signed   By: Rolm Baptise M.D.   On: 11/25/2016 15:47     Assessment & Plan:    Active Problems:   COPD exacerbation (HCC)   Hyperglycemia   Hypertension   Hypokalemia    1. Copd exacerbation Solumedrol '80mg'$  iv q8h zithromax '500mg'$  iv qday Albuterol neb q6h and q6h prn Incruse 1puff qday Consider Breo upon discharge  2,. Hypokalemia Replete Check cmp in am  3. Hyperglycemia Check hga1c  4. Tachycardia ? Due to albuterol monitor Check trop in am If persistent consider Cta chest /cardiac echo, tsh  5. Hypertension Pt didn't take her bp medication today.  Continue metoprolol and lisinopril   6. Tobacco use Nicotine patch offerred.   DVT Prophylaxis Lovenox - SCDs   AM Labs Ordered, also please review Full Orders  Family Communication: Admission, patients condition and plan of care including tests being ordered have been discussed with the patient who indicate understanding and agree with the plan and Code Status.  Code Status FULL CODE  Likely DC to  home  Condition GUARDED    Consults called:   Admission status: observation  Time spent in minutes :45 minutes   Jani Gravel M.D on 11/25/2016 at 8:08 PM  Between 7am to 7pm - Pager - [phone]. After 7pm go to [email or web address] - password [facility]  [facility] - Office  [phone]

## 2016-11-25 NOTE — ED Triage Notes (Signed)
Pt reports sob since Thursday with productive cough, pt wheezing in triage.  Pt does have small amount of heaviness in chest.  Pt alert and oriented.

## 2016-11-26 DIAGNOSIS — E785 Hyperlipidemia, unspecified: Secondary | ICD-10-CM | POA: Diagnosis present

## 2016-11-26 DIAGNOSIS — Z803 Family history of malignant neoplasm of breast: Secondary | ICD-10-CM | POA: Diagnosis not present

## 2016-11-26 DIAGNOSIS — F329 Major depressive disorder, single episode, unspecified: Secondary | ICD-10-CM | POA: Diagnosis present

## 2016-11-26 DIAGNOSIS — R739 Hyperglycemia, unspecified: Secondary | ICD-10-CM | POA: Diagnosis present

## 2016-11-26 DIAGNOSIS — J9601 Acute respiratory failure with hypoxia: Secondary | ICD-10-CM | POA: Diagnosis present

## 2016-11-26 DIAGNOSIS — R000 Tachycardia, unspecified: Secondary | ICD-10-CM | POA: Diagnosis present

## 2016-11-26 DIAGNOSIS — K219 Gastro-esophageal reflux disease without esophagitis: Secondary | ICD-10-CM | POA: Diagnosis present

## 2016-11-26 DIAGNOSIS — I10 Essential (primary) hypertension: Secondary | ICD-10-CM | POA: Diagnosis present

## 2016-11-26 DIAGNOSIS — Z853 Personal history of malignant neoplasm of breast: Secondary | ICD-10-CM | POA: Diagnosis not present

## 2016-11-26 DIAGNOSIS — E876 Hypokalemia: Secondary | ICD-10-CM | POA: Diagnosis present

## 2016-11-26 DIAGNOSIS — R0602 Shortness of breath: Secondary | ICD-10-CM | POA: Diagnosis present

## 2016-11-26 DIAGNOSIS — F17210 Nicotine dependence, cigarettes, uncomplicated: Secondary | ICD-10-CM | POA: Diagnosis present

## 2016-11-26 DIAGNOSIS — M810 Age-related osteoporosis without current pathological fracture: Secondary | ICD-10-CM | POA: Diagnosis present

## 2016-11-26 DIAGNOSIS — J441 Chronic obstructive pulmonary disease with (acute) exacerbation: Principal | ICD-10-CM

## 2016-11-26 DIAGNOSIS — Z79899 Other long term (current) drug therapy: Secondary | ICD-10-CM | POA: Diagnosis not present

## 2016-11-26 DIAGNOSIS — F419 Anxiety disorder, unspecified: Secondary | ICD-10-CM | POA: Diagnosis present

## 2016-11-26 LAB — COMPREHENSIVE METABOLIC PANEL
ALBUMIN: 3.6 g/dL (ref 3.5–5.0)
ALT: 19 U/L (ref 14–54)
AST: 23 U/L (ref 15–41)
Alkaline Phosphatase: 76 U/L (ref 38–126)
Anion gap: 7 (ref 5–15)
BUN: 13 mg/dL (ref 6–20)
CHLORIDE: 103 mmol/L (ref 101–111)
CO2: 28 mmol/L (ref 22–32)
Calcium: 9.2 mg/dL (ref 8.9–10.3)
Creatinine, Ser: 0.78 mg/dL (ref 0.44–1.00)
GFR calc Af Amer: >60 mL/min (ref >60)
GLUCOSE: 166 mg/dL — AB (ref 65–99)
POTASSIUM: 3.9 mmol/L (ref 3.5–5.1)
SODIUM: 138 mmol/L (ref 135–145)
Total Bilirubin: 0.4 mg/dL (ref 0.3–1.2)
Total Protein: 7.4 g/dL (ref 6.5–8.1)

## 2016-11-26 LAB — TROPONIN I

## 2016-11-26 LAB — CBC
HEMATOCRIT: 40.4 % (ref 36.0–46.0)
Hemoglobin: 13 g/dL (ref 12.0–15.0)
MCH: 30.8 pg (ref 26.0–34.0)
MCHC: 32.2 g/dL (ref 30.0–36.0)
MCV: 95.7 fL (ref 78.0–100.0)
Platelets: 283 10*3/uL (ref 150–400)
RBC: 4.22 MIL/uL (ref 3.87–5.11)
RDW: 13.2 % (ref 11.5–15.5)
WBC: 8.4 10*3/uL (ref 4.0–10.5)

## 2016-11-26 MED ORDER — ALBUTEROL SULFATE (2.5 MG/3ML) 0.083% IN NEBU
2.5000 mg | INHALATION_SOLUTION | Freq: Four times a day (QID) | RESPIRATORY_TRACT | Status: DC
  Administered 2016-11-27: 2.5 mg via RESPIRATORY_TRACT
  Filled 2016-11-26 (×2): qty 3

## 2016-11-26 MED ORDER — POTASSIUM CHLORIDE CRYS ER 20 MEQ PO TBCR: 40.0000 meq | EXTENDED_RELEASE_TABLET | Freq: Once | ORAL | Status: DC

## 2016-11-26 NOTE — Progress Notes (Signed)
SATURATION QUALIFICATIONS: (This note is used to comply with regulatory documentation for home oxygen)  Patient Saturations on Room Air at Rest = 93%  Patient Saturations on Room Air while Ambulating = 89-92%  Patient Saturations on 2 Liters of oxygen while Ambulating = not assessed at this time due to patient reporting weakness after ambulation on r/a. Assisted back to chair and O2 sats 93% on 2 lpm at rest.   Please briefly explain why patient needs home oxygen:

## 2016-11-26 NOTE — Progress Notes (Signed)
Patient admitted for COPD exacerbation.  Had a coughing spell that lasted 10-15 minutes.  Ongoing SOB after breathing treatment.  O2 90%.  Solumedrol due to midnight.  RR is 24, labored and using accessory muscles.  Takes Xanax on a regular basis.  Go ahead and give Xanax now but otherwise continue to monitor - an Ow sat of 90% is within the goal range of 88-92% for a COPD patient.  If she continues to have increased WOB, will reevaluate.  Carlyon Shadow, M.D.

## 2016-11-26 NOTE — Progress Notes (Signed)
PROGRESS NOTE                                                                                                                                                                                                             Patient Demographics:    Laura Abbott, is a 71 y.o. female, DOB - 08-07-1946, MIW:[id number]  Admit date - 11/25/2016   Admitting Physician Jani Gravel, MD  Outpatient Primary MD for the patient is Glenda Chroman, MD  LOS - 0  Chief Complaint  Patient presents with  . Shortness of Breath       Brief Narrative   Laura Abbott  is a 71 y.o. female, w/ Copd not on home o2 who apparently c/o cough w yellow sputum x4 days.  Slight dyspnea, wheezing. Denies fever, chills, cp, palp, n/v, brbpr, black stool.  Pox 87% at home today and therefore presented to ED.   In ED, CXR negative for acute process.  Pt mildly hypokalemic. Pt received albuterol neb x2, and solumedrol in the ED, apparently still wheezing.  Pt will be admitted for Copd exacerbation.   Subjective:    [name] today has, No headache, No chest pain, No abdominal pain - No Nausea, No new weakness tingling or numbness, No Cough - Improved SOB.     Assessment  & Plan :     1. Acute Hypoxic.Resp Failure - Due to Ac on Chr COPD exacerbation - Agree with IV steroids, azithromycin, nebulizer treatments and oxygen, already feeling better, increase activity, add flutter valve for pulmonary toiletry, try to titrate off oxygen. Likely home in 1-2 days.  2. Hypertension. On beta blocker and ACE inhibitor combination which will be continued.  3. Smoking. Counseled to quit.  4. Anxiety depression. Continue home regimen of fluoxetine and trazodone.    Family Communication  :  None  Code Status :  Full  Diet : Diet Heart Room service appropriate? Yes; Fluid consistency: Thin   Disposition Plan  :  Home 1-2 days  Consults  :  None  Procedures  :    DVT  Prophylaxis  :  Lovenox    Lab Results  Component Value Date   PLT 283 11/26/2016    Inpatient Medications  Scheduled Meds: . albuterol  2.5 mg Nebulization Q6H  . azithromycin  500 mg Intravenous Q24H  .  calcium-vitamin D   Oral Daily  . enoxaparin (LOVENOX) injection  40 mg Subcutaneous Q24H  . FLUoxetine  40 mg Oral Daily  . lisinopril  10 mg Oral Daily  . methylPREDNISolone (SOLU-MEDROL) injection  80 mg Intravenous Q8H  . metoprolol  50 mg Oral Q2200  . metoprolol tartrate  75 mg Oral Daily  . pantoprazole  40 mg Oral Daily  . potassium chloride  40 mEq Oral Once  . traZODone  50 mg Oral QHS  . umeclidinium bromide  1 puff Inhalation Daily   Continuous Infusions: PRN Meds:.acetaminophen **OR** acetaminophen, albuterol, ALPRAZolam, docusate sodium, nicotine, oxyCODONE-acetaminophen  Antibiotics  :    Anti-infectives    Start     Dose/Rate Route Frequency Ordered Stop   11/25/16 2215  azithromycin (ZITHROMAX) 500 mg in dextrose 5 % 250 mL IVPB     500 mg 250 mL/hr over 60 Minutes Intravenous Every 24 hours 11/25/16 2209           Objective:   Vitals:   11/26/16 0134 11/26/16 0500 11/26/16 0818 11/26/16 0926  BP:  (!) 126/50  (!) 156/63  Pulse:  80  90  Resp:  (!) 22  16  Temp:  98 F (36.7 C)    TempSrc:  Oral    SpO2: 94% 93% 94% 93%  Weight:  86.6 kg (191 lb)    Height:        Wt Readings from Last 3 Encounters:  11/26/16 86.6 kg (191 lb)  05/18/15 88.2 kg (194 lb 6.4 oz)  10/15/14 87.1 kg (192 lb 1.6 oz)     Intake/Output Summary (Last 24 hours) at 11/26/16 1036 Last data filed at 11/26/16 0900  Gross per 24 hour  Intake              490 ml  Output                0 ml  Net              490 ml     Physical Exam  Awake Alert, Oriented X 3, No new F.N deficits, Normal affect [place].AT,PERRAL Supple Neck,No JVD, No cervical lymphadenopathy appriciated.  Symmetrical Chest wall movement, Good air movement bilaterally, mild wheezing RRR,No  Gallops,Rubs or new Murmurs, No Parasternal Heave +ve B.Sounds, Abd Soft, No tenderness, No organomegaly appriciated, No rebound - guarding or rigidity. No Cyanosis, Clubbing or edema, No new Rash or bruise     Data Review:    CBC  Recent Labs Lab 11/25/16 1515 11/26/16 0523  WBC 9.5 8.4  HGB 14.7 13.0  HCT 46.3* 40.4  PLT 282 283  MCV 96.1 95.7  MCH 30.5 30.8  MCHC 31.7 32.2  RDW 13.1 13.2    Chemistries   Recent Labs Lab 11/25/16 1515 11/26/16 0523  NA 137 138  K 3.4* 3.9  CL 100* 103  CO2 29 28  GLUCOSE 128* 166*  BUN 8 13  CREATININE 0.61 0.78  CALCIUM 9.0 9.2  AST  --  23  ALT  --  19  ALKPHOS  --  76  BILITOT  --  0.4   ------------------------------------------------------------------------------------------------------------------ No results for input(s): CHOL, HDL, LDLCALC, TRIG, CHOLHDL, LDLDIRECT in the last 72 hours.  No results found for: HGBA1C ------------------------------------------------------------------------------------------------------------------ No results for input(s): TSH, T4TOTAL, T3FREE, THYROIDAB in the last 72 hours.  Invalid input(s): FREET3 ------------------------------------------------------------------------------------------------------------------ No results for input(s): VITAMINB12, FOLATE, FERRITIN, TIBC, IRON, RETICCTPCT in the last 72 hours.  Coagulation profile  No results for input(s): INR, PROTIME in the last 168 hours.  No results for input(s): DDIMER in the last 72 hours.  Cardiac Enzymes  Recent Labs Lab 11/25/16 1515 11/26/16 0523  TROPONINI <0.03 <0.03   ------------------------------------------------------------------------------------------------------------------ No results found for: BNP  Micro Results No results found for this or any previous visit (from the past 240 hour(s)).  Radiology Reports Dg Chest 2 View  Result Date: 11/25/2016 CLINICAL DATA:  Shortness of breath, wheezing,  cough for 3 days EXAM: CHEST  2 VIEW COMPARISON:  01/03/2014 FINDINGS: Heart and mediastinal contours are within normal limits. No focal opacities or effusions. No acute bony abnormality. IMPRESSION: No active cardiopulmonary disease. Electronically Signed   By: Rolm Baptise M.D.   On: 11/25/2016 15:47    Time Spent in minutes  30   Yadriel Kerrigan K M.D on 11/26/2016 at 10:36 AM  Between 7am to 7pm - Pager - [phone]  After 7pm go to [email or web address] - password [facility]  [facility] -  Office  [phone]

## 2016-11-26 NOTE — Progress Notes (Signed)
Patient transferred to Room 321 due to heat malfunction in Room 341. Central telemetry notified by B. Arthur Holms, Therapist, sports. Pt and family state comfortable at this time. Report given to Vista Deck, RN. Donavan Foil, RN

## 2016-11-26 NOTE — Progress Notes (Signed)
Patient reported having dark colored urine with strong odor, pt and family state she recently had UTI. Requested urinalysis if possible. Text-paged Dr. Candiss Norse to notify. Donavan Foil, RN

## 2016-11-27 LAB — POTASSIUM: Potassium: 4.2 mmol/L (ref 3.5–5.1)

## 2016-11-27 LAB — HEMOGLOBIN A1C
Hgb A1c MFr Bld: 5.7 % — ABNORMAL HIGH (ref 4.8–5.6)
Mean Plasma Glucose: 117 mg/dL

## 2016-11-27 MED ORDER — AZITHROMYCIN 250 MG PO TABS: 250.0000 mg | ORAL_TABLET | Freq: Every day | ORAL | 0 refills | Status: DC

## 2016-11-27 MED ORDER — PREDNISONE 5 MG PO TABS: ORAL_TABLET | ORAL | 0 refills | Status: DC

## 2016-11-27 NOTE — Discharge Summary (Signed)
Laura Abbott:[id number] DOB: [birth year] DOA: 11/25/2016  PCP: Glenda Chroman, MD  Admit date: 11/25/2016  Discharge date: 11/27/2016  Admitted From: Home  Disposition:  Home   Recommendations for Outpatient Follow-up:   Follow up with PCP in 1-2 weeks  PCP Please obtain BMP/CBC, 2 view CXR in 1week,  (see Discharge instructions)   PCP Please follow up on the following pending results: None   Home Health: None   Equipment/Devices: o2  Consultations: None Discharge Condition: Fair   CODE STATUS: Full   Diet Recommendation: Heart Healthy    Chief Complaint  Patient presents with  . Shortness of Breath     Brief history of present illness from the day of admission and additional interim summary    Laura Abbott a 71 y.o.female,w/ Copd not on home o2 who apparently c/o cough w yellow sputum x4 days. Slight dyspnea, wheezing. Denies fever, chills, cp, palp, n/v, brbpr, black stool. Pox 87% at home today and therefore presented to ED.   In ED, CXR negative for acute process. Pt mildly hypokalemic. Pt received albuterol neb x2, and solumedrol in the ED, apparently still wheezing. Pt will be admitted for Copd exacerbation.  Hospital issues addressed      1. Acute Hypoxic.Resp Failure - Due to acute on chronic COPD exacerbation and ongoing smoking along with mild URI/acute bronchitis - he is improved with IV steroids, empiric azithromycin, flutter valve, and [place] treatments and oxygen. She is symptom free and feels much better, she is eager to go home and does not want to stay in the hospital anymore. Her daughter bedside. She will be discharged on steroid taper, 4 more days of azithromycin, she has qualified for home oxygen which she will get, have requested her to take flutter valve home and continue to  use it. Requested her to follow with PCP within a week and also outpatient pulmonary follow-up is recommended.  2. Hypertension. On beta blocker and ACE inhibitor combination which will be continued.  3. Smoking. Counseled to quit.  4. Anxiety depression. Continue home regimen of fluoxetine and trazodone.      Discharge diagnosis     Active Problems:   COPD exacerbation ([place])   Hyperglycemia   Hypertension   Hypokalemia    Discharge instructions    Discharge Instructions    Discharge instructions    Complete by:  As directed    Follow with Primary MD Glenda Chroman, MD in 7 days   Get CBC, CMP, 2 view Chest X ray checked  by Primary MD or SNF MD in 5-7 days ( we routinely change or add medications that can affect your baseline labs and fluid status, therefore we recommend that you get the mentioned basic workup next visit with your PCP, your PCP may decide not to get them or add new tests based on their clinical decision)   Activity: As tolerated with Full fall precautions use walker/cane & assistance as needed   Disposition Home    Diet:   Heart Healthy  For Heart failure patients - Check your Weight same time everyday, if you gain over 2 pounds, or you develop in leg swelling, experience more shortness of breath or chest pain, call your Primary MD immediately. Follow Cardiac Low Salt Diet and 1.5 lit/day fluid restriction.   On your next visit with your primary care physician please Get Medicines reviewed and adjusted.   Please request your Prim.MD to go over all Hospital Tests and Procedure/Radiological results at the follow up, please get all Hospital records sent to your Prim MD by signing hospital release before you go home.   If you experience worsening of your admission symptoms, develop shortness of breath, life threatening emergency, suicidal or homicidal thoughts you must seek medical attention immediately by calling 911 or calling your MD immediately  if  symptoms less severe.  You Must read complete instructions/literature along with all the possible adverse reactions/side effects for all the Medicines you take and that have been prescribed to you. Take any new Medicines after you have completely understood and accpet all the possible adverse reactions/side effects.   Do not drive, operate heavy machinery, perform activities at heights, swimming or participation in water activities or provide baby sitting services if your were admitted for syncope or siezures until you have seen by Primary MD or a Neurologist and advised to do so again.  Do not drive when taking Pain medications.    Do not take more than prescribed Pain, Sleep and Anxiety Medications  Special Instructions: If you have smoked or chewed Tobacco  in the last 2 yrs please stop smoking, stop any regular Alcohol  and or any Recreational drug use.  Wear Seat belts while driving.   Please note  You were cared for by a hospitalist during your hospital stay. If you have any questions about your discharge medications or the care you received while you were in the hospital after you are discharged, you can call the unit and asked to speak with the hospitalist on call if the hospitalist that took care of you is not available. Once you are discharged, your primary care physician will handle any further medical issues. Please note that NO REFILLS for any discharge medications will be authorized once you are discharged, as it is imperative that you return to your primary care physician (or establish a relationship with a primary care physician if you do not have one) for your aftercare needs so that they can reassess your need for medications and monitor your lab values.   Increase activity slowly    Complete by:  As directed       Discharge Medications   Allergies as of 11/27/2016   No Known Allergies     Medication List    TAKE these medications   albuterol 108 (90 Base) MCG/ACT  inhaler Commonly known as:  PROVENTIL HFA;VENTOLIN HFA Inhale 2 puffs into the lungs 3 (three) times daily. Also use the inhaler every 2 hours if needed for worsening shortness of breath and wheezing.   ALPRAZolam 0.5 MG tablet Commonly known as:  XANAX Take 0.5 mg by mouth at bedtime as needed for anxiety.   azithromycin 250 MG tablet Commonly known as:  ZITHROMAX Take 1 tablet (250 mg total) by mouth daily.   Calcium 600-200 MG-UNIT tablet Take 1 tablet by mouth daily.   docusate sodium 100 MG capsule Commonly known as:  COLACE Take 100 mg by mouth daily as needed for mild constipation.   econazole nitrate 1 % cream  Apply topically 3 (three) times daily as needed.   FLUoxetine 40 MG capsule Commonly known as:  PROZAC Take 40 mg by mouth daily.   lisinopril 10 MG tablet Commonly known as:  PRINIVIL,ZESTRIL Take 10 mg by mouth daily.   metoprolol 50 MG tablet Commonly known as:  LOPRESSOR Take 50-75 mg by mouth 2 (two) times daily. Takes 75 mg in the morning and 50 mg in the evening   omeprazole 20 MG capsule Commonly known as:  PRILOSEC Take 20 mg by mouth 2 (two) times daily.   oxyCODONE-acetaminophen 5-325 MG tablet Commonly known as:  ROXICET Take 1-2 tablets by mouth every 4 (four) hours as needed for severe pain.   predniSONE 5 MG tablet Commonly known as:  DELTASONE Label  & dispense according to the schedule below. 10 Pills PO for 3 days then, 8 Pills PO for 3 days, 6 Pills PO for 3 days, 4 Pills PO for 3 days, 2 Pills PO for 3 days, 1 Pills PO for 3 days, 1/2 Pill  PO for 3 days then STOP. Total 95 pills.   traZODone 50 MG tablet Commonly known as:  DESYREL Take 50 mg by mouth at bedtime.            Durable Medical Equipment        Start     Ordered   11/27/16 [phone]  For home use only DME oxygen  Once    Question Answer Comment  Mode or (Route) Nasal cannula   Liters per Minute 2   Frequency Continuous (stationary and portable oxygen unit  needed)   Oxygen conserving device Yes   Oxygen delivery system Gas      11/27/16 0950      Follow-up Information    Glenda Chroman, MD. Schedule an appointment as soon as possible for a visit in 1 week(s).   Specialty:  Internal Medicine Contact information: [place] [address] [phone]        HAWKINS,EDWARD L, MD. Schedule an appointment as soon as possible for a visit in 1 week(s).   Specialty:  Pulmonary Disease Contact information: [place] [address] [phone]           Major procedures and Radiology Reports - PLEASE review detailed and final reports thoroughly  -       Dg Chest 2 View  Result Date: 11/25/2016 CLINICAL DATA:  Shortness of breath, wheezing, cough for 3 days EXAM: CHEST  2 VIEW COMPARISON:  01/03/2014 FINDINGS: Heart and mediastinal contours are within normal limits. No focal opacities or effusions. No acute bony abnormality. IMPRESSION: No active cardiopulmonary disease. Electronically Signed   By: Rolm Baptise M.D.   On: 11/25/2016 15:47    Micro Results     No results found for this or any previous visit (from the past 240 hour(s)).  Today   Subjective    [name] today has no headache,no chest abdominal pain,no new weakness tingling or numbness, feels much better wants to go home today.     Objective   Blood pressure (!) 148/55, pulse 68, temperature 98.2 F (36.8 C), temperature source Oral, resp. rate 18, height '5\' 3"'$  (1.6 m), weight 86.3 kg (190 lb 4.8 oz), SpO2 94 %.   Intake/Output Summary (Last 24 hours) at 11/27/16 0952 Last data filed at 11/26/16 1749  Gross per 24 hour  Intake  120 ml  Output              500 ml  Net             -380 ml    Exam Awake Alert, Oriented x 3, No new F.N deficits, Normal affect [place].AT,PERRAL Supple Neck,No JVD, No cervical lymphadenopathy appriciated.  Symmetrical Chest wall movement, Good air movement bilaterally,  CTAB RRR,No Gallops,Rubs or new Murmurs, No Parasternal Heave +ve B.Sounds, Abd Soft, Non tender, No organomegaly appriciated, No rebound -guarding or rigidity. No Cyanosis, Clubbing or edema, No new Rash or bruise   Data Review   CBC w Diff: Lab Results  Component Value Date   WBC 8.4 11/26/2016   HGB 13.0 11/26/2016   HCT 40.4 11/26/2016   PLT 283 11/26/2016   LYMPHOPCT 3 (L) 01/03/2014   MONOPCT 7 01/03/2014   EOSPCT 0 01/03/2014   BASOPCT 0 01/03/2014    CMP: Lab Results  Component Value Date   NA 138 11/26/2016   K 4.2 11/27/2016   CL 103 11/26/2016   CO2 28 11/26/2016   BUN 13 11/26/2016   CREATININE 0.78 11/26/2016   PROT 7.4 11/26/2016   ALBUMIN 3.6 11/26/2016   BILITOT 0.4 11/26/2016   ALKPHOS 76 11/26/2016   AST 23 11/26/2016   ALT 19 11/26/2016  .   Total Time in preparing paper work, data evaluation and todays exam - 35 minutes  Thurnell Lose M.D on 11/27/2016 at 9:52 AM  [facility]   Office  [phone]

## 2016-11-27 NOTE — Progress Notes (Addendum)
Discharge instructions read to patient and her family member.  Both verbalized understanding of instructions. Smoking quit tips reviewed with patient.Disharged to home with family Home O2

## 2016-11-27 NOTE — Care Management Note (Signed)
Case Management Note  Patient Details  Name: Laura Abbott MRN: [id number] Date of Birth: September 15, 1946    Expected Discharge Date:       11/27/2016           Expected Discharge Plan:  Home/Self Care  In-House Referral:  NA  Discharge planning Services  CM Consult  Post Acute Care Choice:  Durable Medical Equipment Choice offered to:  Patient  DME Arranged:  Oxygen DME Agency:  [place]:    [facility] Agency:     Status of Service:  Completed, signed off  If discussed at [place] of Stay Meetings, dates discussed:    Additional Comments: Patient discharging today. She will need home oxygen. Romualdo Bolk of [facility] notified and will deliver oxygen to room prior to discharge. Patient declines need for home health.   Tyresha Fede, Chauncey Reading, RN 11/27/2016, 11:40 AM

## 2016-11-27 NOTE — Discharge Instructions (Signed)
Follow with Primary MD Glenda Chroman, MD in 7 days   Get CBC, CMP, 2 view Chest X ray checked  by Primary MD or SNF MD in 5-7 days ( we routinely change or add medications that can affect your baseline labs and fluid status, therefore we recommend that you get the mentioned basic workup next visit with your PCP, your PCP may decide not to get them or add new tests based on their clinical decision)   Activity: As tolerated with Full fall precautions use walker/cane & assistance as needed   Disposition Home    Diet:   Heart Healthy   For Heart failure patients - Check your Weight same time everyday, if you gain over 2 pounds, or you develop in leg swelling, experience more shortness of breath or chest pain, call your Primary MD immediately. Follow Cardiac Low Salt Diet and 1.5 lit/day fluid restriction.   On your next visit with your primary care physician please Get Medicines reviewed and adjusted.   Please request your Prim.MD to go over all Hospital Tests and Procedure/Radiological results at the follow up, please get all Hospital records sent to your Prim MD by signing hospital release before you go home.   If you experience worsening of your admission symptoms, develop shortness of breath, life threatening emergency, suicidal or homicidal thoughts you must seek medical attention immediately by calling 911 or calling your MD immediately  if symptoms less severe.  You Must read complete instructions/literature along with all the possible adverse reactions/side effects for all the Medicines you take and that have been prescribed to you. Take any new Medicines after you have completely understood and accpet all the possible adverse reactions/side effects.   Do not drive, operate heavy machinery, perform activities at heights, swimming or participation in water activities or provide baby sitting services if your were admitted for syncope or siezures until you have seen by Primary MD or a  Neurologist and advised to do so again.  Do not drive when taking Pain medications.    Do not take more than prescribed Pain, Sleep and Anxiety Medications  Special Instructions: If you have smoked or chewed Tobacco  in the last 2 yrs please stop smoking, stop any regular Alcohol  and or any Recreational drug use.  Wear Seat belts while driving.   Please note  You were cared for by a hospitalist during your hospital stay. If you have any questions about your discharge medications or the care you received while you were in the hospital after you are discharged, you can call the unit and asked to speak with the hospitalist on call if the hospitalist that took care of you is not available. Once you are discharged, your primary care physician will handle any further medical issues. Please note that NO REFILLS for any discharge medications will be authorized once you are discharged, as it is imperative that you return to your primary care physician (or establish a relationship with a primary care physician if you do not have one) for your aftercare needs so that they can reassess your need for medications and monitor your lab values.

## 2016-11-27 NOTE — Progress Notes (Addendum)
SATURATION QUALIFICATIONS: (This note is used to comply with regulatory documentation for home oxygen)  Patient Saturations on Room Air at Rest = 90%  Patient Saturations on Room Air while Ambulating = 84%  Patient Saturations on 2Liters of oxygen while Ambulating =95 %  Please briefly explain why patient needs home oxygen:Pt recovered to 95% on 2L [place]

## 2018-04-30 DIAGNOSIS — I10 Essential (primary) hypertension: Secondary | ICD-10-CM | POA: Diagnosis not present

## 2018-04-30 DIAGNOSIS — J449 Chronic obstructive pulmonary disease, unspecified: Secondary | ICD-10-CM | POA: Diagnosis not present

## 2018-04-30 DIAGNOSIS — M159 Polyosteoarthritis, unspecified: Secondary | ICD-10-CM | POA: Diagnosis not present

## 2018-06-04 DIAGNOSIS — Z299 Encounter for prophylactic measures, unspecified: Secondary | ICD-10-CM | POA: Diagnosis not present

## 2018-06-04 DIAGNOSIS — I10 Essential (primary) hypertension: Secondary | ICD-10-CM | POA: Diagnosis not present

## 2018-06-04 DIAGNOSIS — J449 Chronic obstructive pulmonary disease, unspecified: Secondary | ICD-10-CM | POA: Diagnosis not present

## 2018-06-04 DIAGNOSIS — F17210 Nicotine dependence, cigarettes, uncomplicated: Secondary | ICD-10-CM | POA: Diagnosis not present

## 2018-06-04 DIAGNOSIS — Z6836 Body mass index (BMI) 36.0-36.9, adult: Secondary | ICD-10-CM | POA: Diagnosis not present

## 2018-06-04 DIAGNOSIS — M545 Low back pain: Secondary | ICD-10-CM | POA: Diagnosis not present

## 2018-06-23 DIAGNOSIS — I10 Essential (primary) hypertension: Secondary | ICD-10-CM | POA: Diagnosis not present

## 2018-06-23 DIAGNOSIS — J449 Chronic obstructive pulmonary disease, unspecified: Secondary | ICD-10-CM | POA: Diagnosis not present

## 2018-06-23 DIAGNOSIS — M159 Polyosteoarthritis, unspecified: Secondary | ICD-10-CM | POA: Diagnosis not present

## 2018-07-16 DIAGNOSIS — Z713 Dietary counseling and surveillance: Secondary | ICD-10-CM | POA: Diagnosis not present

## 2018-07-16 DIAGNOSIS — I10 Essential (primary) hypertension: Secondary | ICD-10-CM | POA: Diagnosis not present

## 2018-07-16 DIAGNOSIS — Z299 Encounter for prophylactic measures, unspecified: Secondary | ICD-10-CM | POA: Diagnosis not present

## 2018-07-16 DIAGNOSIS — Z6836 Body mass index (BMI) 36.0-36.9, adult: Secondary | ICD-10-CM | POA: Diagnosis not present

## 2018-07-22 DIAGNOSIS — I10 Essential (primary) hypertension: Secondary | ICD-10-CM | POA: Diagnosis not present

## 2018-07-22 DIAGNOSIS — M159 Polyosteoarthritis, unspecified: Secondary | ICD-10-CM | POA: Diagnosis not present

## 2018-07-22 DIAGNOSIS — J449 Chronic obstructive pulmonary disease, unspecified: Secondary | ICD-10-CM | POA: Diagnosis not present

## 2018-08-14 DIAGNOSIS — E7800 Pure hypercholesterolemia, unspecified: Secondary | ICD-10-CM | POA: Diagnosis not present

## 2018-08-14 DIAGNOSIS — Z1331 Encounter for screening for depression: Secondary | ICD-10-CM | POA: Diagnosis not present

## 2018-08-14 DIAGNOSIS — Z1339 Encounter for screening examination for other mental health and behavioral disorders: Secondary | ICD-10-CM | POA: Diagnosis not present

## 2018-08-14 DIAGNOSIS — J449 Chronic obstructive pulmonary disease, unspecified: Secondary | ICD-10-CM | POA: Diagnosis not present

## 2018-08-14 DIAGNOSIS — Z299 Encounter for prophylactic measures, unspecified: Secondary | ICD-10-CM | POA: Diagnosis not present

## 2018-08-14 DIAGNOSIS — Z6838 Body mass index (BMI) 38.0-38.9, adult: Secondary | ICD-10-CM | POA: Diagnosis not present

## 2018-08-14 DIAGNOSIS — Z79899 Other long term (current) drug therapy: Secondary | ICD-10-CM | POA: Diagnosis not present

## 2018-08-14 DIAGNOSIS — R5383 Other fatigue: Secondary | ICD-10-CM | POA: Diagnosis not present

## 2018-08-14 DIAGNOSIS — Z0000 Encounter for general adult medical examination without abnormal findings: Secondary | ICD-10-CM | POA: Diagnosis not present

## 2018-08-14 DIAGNOSIS — Z7189 Other specified counseling: Secondary | ICD-10-CM | POA: Diagnosis not present

## 2018-08-28 DIAGNOSIS — Z7952 Long term (current) use of systemic steroids: Secondary | ICD-10-CM | POA: Diagnosis not present

## 2018-08-28 DIAGNOSIS — M810 Age-related osteoporosis without current pathological fracture: Secondary | ICD-10-CM | POA: Diagnosis not present

## 2018-08-28 DIAGNOSIS — E8940 Asymptomatic postprocedural ovarian failure: Secondary | ICD-10-CM | POA: Diagnosis not present

## 2018-09-03 DIAGNOSIS — I10 Essential (primary) hypertension: Secondary | ICD-10-CM | POA: Diagnosis not present

## 2018-09-03 DIAGNOSIS — Z6838 Body mass index (BMI) 38.0-38.9, adult: Secondary | ICD-10-CM | POA: Diagnosis not present

## 2018-09-03 DIAGNOSIS — M810 Age-related osteoporosis without current pathological fracture: Secondary | ICD-10-CM | POA: Diagnosis not present

## 2018-09-03 DIAGNOSIS — E7800 Pure hypercholesterolemia, unspecified: Secondary | ICD-10-CM | POA: Diagnosis not present

## 2018-09-03 DIAGNOSIS — F419 Anxiety disorder, unspecified: Secondary | ICD-10-CM | POA: Diagnosis not present

## 2018-09-03 DIAGNOSIS — Z299 Encounter for prophylactic measures, unspecified: Secondary | ICD-10-CM | POA: Diagnosis not present

## 2018-09-16 DIAGNOSIS — M810 Age-related osteoporosis without current pathological fracture: Secondary | ICD-10-CM | POA: Diagnosis not present

## 2018-09-29 DIAGNOSIS — M159 Polyosteoarthritis, unspecified: Secondary | ICD-10-CM | POA: Diagnosis not present

## 2018-09-29 DIAGNOSIS — J449 Chronic obstructive pulmonary disease, unspecified: Secondary | ICD-10-CM | POA: Diagnosis not present

## 2018-09-29 DIAGNOSIS — I10 Essential (primary) hypertension: Secondary | ICD-10-CM | POA: Diagnosis not present

## 2018-10-10 DIAGNOSIS — H5200 Hypermetropia, unspecified eye: Secondary | ICD-10-CM | POA: Diagnosis not present

## 2018-10-16 DIAGNOSIS — Z299 Encounter for prophylactic measures, unspecified: Secondary | ICD-10-CM | POA: Diagnosis not present

## 2018-10-16 DIAGNOSIS — J449 Chronic obstructive pulmonary disease, unspecified: Secondary | ICD-10-CM | POA: Diagnosis not present

## 2018-10-16 DIAGNOSIS — E7800 Pure hypercholesterolemia, unspecified: Secondary | ICD-10-CM | POA: Diagnosis not present

## 2018-10-16 DIAGNOSIS — F419 Anxiety disorder, unspecified: Secondary | ICD-10-CM | POA: Diagnosis not present

## 2018-10-16 DIAGNOSIS — I10 Essential (primary) hypertension: Secondary | ICD-10-CM | POA: Diagnosis not present

## 2018-10-16 DIAGNOSIS — Z6838 Body mass index (BMI) 38.0-38.9, adult: Secondary | ICD-10-CM | POA: Diagnosis not present

## 2018-10-24 DIAGNOSIS — C3412 Malignant neoplasm of upper lobe, left bronchus or lung: Secondary | ICD-10-CM | POA: Diagnosis not present

## 2018-10-24 DIAGNOSIS — E669 Obesity, unspecified: Secondary | ICD-10-CM | POA: Diagnosis not present

## 2018-10-24 DIAGNOSIS — J9602 Acute respiratory failure with hypercapnia: Secondary | ICD-10-CM | POA: Diagnosis not present

## 2018-10-24 DIAGNOSIS — J9601 Acute respiratory failure with hypoxia: Secondary | ICD-10-CM | POA: Diagnosis not present

## 2018-10-24 DIAGNOSIS — J209 Acute bronchitis, unspecified: Secondary | ICD-10-CM | POA: Diagnosis not present

## 2018-10-24 DIAGNOSIS — R51 Headache: Secondary | ICD-10-CM | POA: Diagnosis not present

## 2018-10-24 DIAGNOSIS — J441 Chronic obstructive pulmonary disease with (acute) exacerbation: Secondary | ICD-10-CM | POA: Diagnosis not present

## 2018-10-24 DIAGNOSIS — I10 Essential (primary) hypertension: Secondary | ICD-10-CM | POA: Diagnosis not present

## 2018-10-24 DIAGNOSIS — Z720 Tobacco use: Secondary | ICD-10-CM | POA: Diagnosis not present

## 2018-10-24 DIAGNOSIS — R918 Other nonspecific abnormal finding of lung field: Secondary | ICD-10-CM | POA: Diagnosis not present

## 2018-10-24 DIAGNOSIS — F17210 Nicotine dependence, cigarettes, uncomplicated: Secondary | ICD-10-CM | POA: Diagnosis not present

## 2018-10-24 DIAGNOSIS — R0602 Shortness of breath: Secondary | ICD-10-CM | POA: Diagnosis not present

## 2018-10-24 DIAGNOSIS — J440 Chronic obstructive pulmonary disease with acute lower respiratory infection: Secondary | ICD-10-CM | POA: Diagnosis not present

## 2018-10-24 DIAGNOSIS — R0902 Hypoxemia: Secondary | ICD-10-CM | POA: Diagnosis not present

## 2018-10-29 ENCOUNTER — Other Ambulatory Visit (HOSPITAL_COMMUNITY): Payer: Self-pay | Admitting: Oncology

## 2018-10-29 DIAGNOSIS — R918 Other nonspecific abnormal finding of lung field: Secondary | ICD-10-CM

## 2018-10-31 DIAGNOSIS — J441 Chronic obstructive pulmonary disease with (acute) exacerbation: Secondary | ICD-10-CM | POA: Diagnosis not present

## 2018-10-31 DIAGNOSIS — M179 Osteoarthritis of knee, unspecified: Secondary | ICD-10-CM | POA: Diagnosis not present

## 2018-10-31 DIAGNOSIS — F329 Major depressive disorder, single episode, unspecified: Secondary | ICD-10-CM | POA: Diagnosis not present

## 2018-10-31 DIAGNOSIS — F419 Anxiety disorder, unspecified: Secondary | ICD-10-CM | POA: Diagnosis not present

## 2018-10-31 DIAGNOSIS — J440 Chronic obstructive pulmonary disease with acute lower respiratory infection: Secondary | ICD-10-CM | POA: Diagnosis not present

## 2018-10-31 DIAGNOSIS — R918 Other nonspecific abnormal finding of lung field: Secondary | ICD-10-CM | POA: Diagnosis not present

## 2018-10-31 DIAGNOSIS — M810 Age-related osteoporosis without current pathological fracture: Secondary | ICD-10-CM | POA: Diagnosis not present

## 2018-10-31 DIAGNOSIS — J209 Acute bronchitis, unspecified: Secondary | ICD-10-CM | POA: Diagnosis not present

## 2018-10-31 DIAGNOSIS — I10 Essential (primary) hypertension: Secondary | ICD-10-CM | POA: Diagnosis not present

## 2018-11-03 ENCOUNTER — Other Ambulatory Visit: Payer: Self-pay

## 2018-11-03 DIAGNOSIS — M810 Age-related osteoporosis without current pathological fracture: Secondary | ICD-10-CM | POA: Diagnosis not present

## 2018-11-03 DIAGNOSIS — J440 Chronic obstructive pulmonary disease with acute lower respiratory infection: Secondary | ICD-10-CM | POA: Diagnosis not present

## 2018-11-03 DIAGNOSIS — J209 Acute bronchitis, unspecified: Secondary | ICD-10-CM | POA: Diagnosis not present

## 2018-11-03 DIAGNOSIS — F419 Anxiety disorder, unspecified: Secondary | ICD-10-CM | POA: Diagnosis not present

## 2018-11-03 DIAGNOSIS — I10 Essential (primary) hypertension: Secondary | ICD-10-CM | POA: Diagnosis not present

## 2018-11-03 DIAGNOSIS — J441 Chronic obstructive pulmonary disease with (acute) exacerbation: Secondary | ICD-10-CM | POA: Diagnosis not present

## 2018-11-03 DIAGNOSIS — M179 Osteoarthritis of knee, unspecified: Secondary | ICD-10-CM | POA: Diagnosis not present

## 2018-11-03 DIAGNOSIS — F329 Major depressive disorder, single episode, unspecified: Secondary | ICD-10-CM | POA: Diagnosis not present

## 2018-11-03 DIAGNOSIS — R918 Other nonspecific abnormal finding of lung field: Secondary | ICD-10-CM | POA: Diagnosis not present

## 2018-11-03 NOTE — Patient Outreach (Signed)
[place] [facility]) Care Management  11/03/2018  Laura Abbott December 22, 1945 [id number]     Transition of Care Referral  Referral Date: 11/03/18 Referral Source: [facility] Discharge Report Date of Admission: unknown Diagnosis: unknown Date of Discharge: 10/29/18 Facility: [place]: [facility]    Outreach attempt # 1 to patient. No answer at present. RN CM left HIPAA compliant voicemail message along with contact info.     Plan: RN CM will make outreach attempt to patient within 3-4 business days. RN CM will send unsuccessful outreach letter to patient.    Enzo Montgomery, RN,BSN,CCM [place] Management Telephonic Care Management Coordinator Direct Phone: [phone] Toll Free: [phone] Fax: [phone]

## 2018-11-04 ENCOUNTER — Other Ambulatory Visit: Payer: Self-pay

## 2018-11-04 NOTE — Patient Outreach (Signed)
[place] [facility]) Care Management  11/04/2018  Laura Abbott 06-08-1946 [id number]   Transition of Care Referral  Referral Date: 11/03/18 Referral Source: [facility] Discharge Report Date of Admission: unknown Diagnosis: unknown Date of Discharge: 10/29/18 Facility: [place]: [facility]   Outreach attempt #2 to patient. No answer at present.     Plan: RN CM will make outreach attempt to patient within 3-4 business days.   Enzo Montgomery, RN,BSN,CCM [place] Management Telephonic Care Management Coordinator Direct Phone: [phone] Toll Free: [phone] Fax: [phone]

## 2018-11-05 ENCOUNTER — Other Ambulatory Visit: Payer: Self-pay

## 2018-11-05 NOTE — Patient Outreach (Signed)
[place] [facility]) Care Management  11/05/2018  Laura Abbott 1946/10/09 [id number]   Transition of Care Referral  Referral Date:11/03/18 Referral Source:[employer] Discharge Report Date of Admission:unknown Diagnosis:unknown Date of Discharge:10/29/18 [place] [employer]   Outreach attempt #3 to patient. No answer at present. RN CM left HIPAA compliant voicemail message.      Plan: RN CM will close case if no response from letter mailed to patient.  Enzo Montgomery, RN,BSN,CCM [place] Management Telephonic Care Management Coordinator Direct Phone: [phone] Toll Free: [phone] Fax: [phone]

## 2018-11-06 DIAGNOSIS — J440 Chronic obstructive pulmonary disease with acute lower respiratory infection: Secondary | ICD-10-CM | POA: Diagnosis not present

## 2018-11-06 DIAGNOSIS — F419 Anxiety disorder, unspecified: Secondary | ICD-10-CM | POA: Diagnosis not present

## 2018-11-06 DIAGNOSIS — I10 Essential (primary) hypertension: Secondary | ICD-10-CM | POA: Diagnosis not present

## 2018-11-06 DIAGNOSIS — J441 Chronic obstructive pulmonary disease with (acute) exacerbation: Secondary | ICD-10-CM | POA: Diagnosis not present

## 2018-11-06 DIAGNOSIS — F329 Major depressive disorder, single episode, unspecified: Secondary | ICD-10-CM | POA: Diagnosis not present

## 2018-11-06 DIAGNOSIS — J209 Acute bronchitis, unspecified: Secondary | ICD-10-CM | POA: Diagnosis not present

## 2018-11-06 DIAGNOSIS — Z6837 Body mass index (BMI) 37.0-37.9, adult: Secondary | ICD-10-CM | POA: Diagnosis not present

## 2018-11-06 DIAGNOSIS — J449 Chronic obstructive pulmonary disease, unspecified: Secondary | ICD-10-CM | POA: Diagnosis not present

## 2018-11-06 DIAGNOSIS — R918 Other nonspecific abnormal finding of lung field: Secondary | ICD-10-CM | POA: Diagnosis not present

## 2018-11-06 DIAGNOSIS — Z299 Encounter for prophylactic measures, unspecified: Secondary | ICD-10-CM | POA: Diagnosis not present

## 2018-11-06 DIAGNOSIS — M810 Age-related osteoporosis without current pathological fracture: Secondary | ICD-10-CM | POA: Diagnosis not present

## 2018-11-06 DIAGNOSIS — M179 Osteoarthritis of knee, unspecified: Secondary | ICD-10-CM | POA: Diagnosis not present

## 2018-11-07 ENCOUNTER — Encounter (HOSPITAL_COMMUNITY): Payer: Self-pay

## 2018-11-07 ENCOUNTER — Ambulatory Visit (HOSPITAL_COMMUNITY): Admission: RE | Admit: 2018-11-07 | Payer: Medicare HMO | Source: Ambulatory Visit

## 2018-11-11 DIAGNOSIS — J209 Acute bronchitis, unspecified: Secondary | ICD-10-CM | POA: Diagnosis not present

## 2018-11-11 DIAGNOSIS — R918 Other nonspecific abnormal finding of lung field: Secondary | ICD-10-CM | POA: Diagnosis not present

## 2018-11-11 DIAGNOSIS — M179 Osteoarthritis of knee, unspecified: Secondary | ICD-10-CM | POA: Diagnosis not present

## 2018-11-11 DIAGNOSIS — J440 Chronic obstructive pulmonary disease with acute lower respiratory infection: Secondary | ICD-10-CM | POA: Diagnosis not present

## 2018-11-11 DIAGNOSIS — J441 Chronic obstructive pulmonary disease with (acute) exacerbation: Secondary | ICD-10-CM | POA: Diagnosis not present

## 2018-11-11 DIAGNOSIS — F329 Major depressive disorder, single episode, unspecified: Secondary | ICD-10-CM | POA: Diagnosis not present

## 2018-11-11 DIAGNOSIS — F419 Anxiety disorder, unspecified: Secondary | ICD-10-CM | POA: Diagnosis not present

## 2018-11-11 DIAGNOSIS — I10 Essential (primary) hypertension: Secondary | ICD-10-CM | POA: Diagnosis not present

## 2018-11-11 DIAGNOSIS — M810 Age-related osteoporosis without current pathological fracture: Secondary | ICD-10-CM | POA: Diagnosis not present

## 2018-11-13 DIAGNOSIS — F329 Major depressive disorder, single episode, unspecified: Secondary | ICD-10-CM | POA: Diagnosis not present

## 2018-11-13 DIAGNOSIS — J441 Chronic obstructive pulmonary disease with (acute) exacerbation: Secondary | ICD-10-CM | POA: Diagnosis not present

## 2018-11-13 DIAGNOSIS — I10 Essential (primary) hypertension: Secondary | ICD-10-CM | POA: Diagnosis not present

## 2018-11-13 DIAGNOSIS — M810 Age-related osteoporosis without current pathological fracture: Secondary | ICD-10-CM | POA: Diagnosis not present

## 2018-11-13 DIAGNOSIS — M179 Osteoarthritis of knee, unspecified: Secondary | ICD-10-CM | POA: Diagnosis not present

## 2018-11-13 DIAGNOSIS — J440 Chronic obstructive pulmonary disease with acute lower respiratory infection: Secondary | ICD-10-CM | POA: Diagnosis not present

## 2018-11-13 DIAGNOSIS — F419 Anxiety disorder, unspecified: Secondary | ICD-10-CM | POA: Diagnosis not present

## 2018-11-13 DIAGNOSIS — R918 Other nonspecific abnormal finding of lung field: Secondary | ICD-10-CM | POA: Diagnosis not present

## 2018-11-13 DIAGNOSIS — J209 Acute bronchitis, unspecified: Secondary | ICD-10-CM | POA: Diagnosis not present

## 2018-11-14 ENCOUNTER — Other Ambulatory Visit: Payer: Self-pay

## 2018-11-14 NOTE — Patient Outreach (Signed)
[name]) Care Management  11/14/2018  Laura Abbott 1945/11/28 [id number]   Transition of Care Referral  Referral Date:11/03/18 Referral Source:[employer] Discharge Report Date of Admission:unknown Diagnosis:unknown Date of Discharge:10/29/18 [place] Medicare    Multiple attempts to establish contact with patient without success. No response from letter mailed to patient. Case is being closed at this time.     Plan: RN CM will close case at this time.   Enzo Montgomery, RN,BSN,CCM [place] Management Telephonic Care Management Coordinator Direct Phone: [phone] Toll Free: [phone] Fax: [phone]

## 2018-11-17 ENCOUNTER — Encounter (HOSPITAL_COMMUNITY)
Admission: RE | Admit: 2018-11-17 | Discharge: 2018-11-17 | Disposition: A | Discharge status: Home / Self Care | Payer: Medicare HMO | Source: Ambulatory Visit | Attending: Oncology | Admitting: Oncology

## 2018-11-17 DIAGNOSIS — R918 Other nonspecific abnormal finding of lung field: Secondary | ICD-10-CM | POA: Diagnosis not present

## 2018-11-17 MED ORDER — FLUDEOXYGLUCOSE F - 18 (FDG) INJECTION
11.8600 | Freq: Once | INTRAVENOUS | Status: AC | PRN
  Administered 2018-11-17: 11.86 via INTRAVENOUS

## 2018-11-21 DIAGNOSIS — I10 Essential (primary) hypertension: Secondary | ICD-10-CM | POA: Diagnosis not present

## 2018-11-21 DIAGNOSIS — F329 Major depressive disorder, single episode, unspecified: Secondary | ICD-10-CM | POA: Diagnosis not present

## 2018-11-21 DIAGNOSIS — M810 Age-related osteoporosis without current pathological fracture: Secondary | ICD-10-CM | POA: Diagnosis not present

## 2018-11-21 DIAGNOSIS — J440 Chronic obstructive pulmonary disease with acute lower respiratory infection: Secondary | ICD-10-CM | POA: Diagnosis not present

## 2018-11-21 DIAGNOSIS — M179 Osteoarthritis of knee, unspecified: Secondary | ICD-10-CM | POA: Diagnosis not present

## 2018-11-21 DIAGNOSIS — F419 Anxiety disorder, unspecified: Secondary | ICD-10-CM | POA: Diagnosis not present

## 2018-11-21 DIAGNOSIS — J441 Chronic obstructive pulmonary disease with (acute) exacerbation: Secondary | ICD-10-CM | POA: Diagnosis not present

## 2018-11-21 DIAGNOSIS — R918 Other nonspecific abnormal finding of lung field: Secondary | ICD-10-CM | POA: Diagnosis not present

## 2018-11-21 DIAGNOSIS — J209 Acute bronchitis, unspecified: Secondary | ICD-10-CM | POA: Diagnosis not present

## 2018-11-24 ENCOUNTER — Encounter (HOSPITAL_COMMUNITY): Payer: Self-pay | Admitting: Hematology

## 2018-11-24 ENCOUNTER — Other Ambulatory Visit: Payer: Self-pay

## 2018-11-24 ENCOUNTER — Inpatient Hospital Stay (HOSPITAL_COMMUNITY): Payer: Medicare HMO | Attending: Hematology | Admitting: Hematology

## 2018-11-24 VITALS — BP 159/55 | HR 67 | Temp 98.4°F | Resp 18 | Ht 62.0 in | Wt 206.0 lb

## 2018-11-24 DIAGNOSIS — Z79899 Other long term (current) drug therapy: Secondary | ICD-10-CM | POA: Diagnosis not present

## 2018-11-24 DIAGNOSIS — Z800 Family history of malignant neoplasm of digestive organs: Secondary | ICD-10-CM | POA: Diagnosis not present

## 2018-11-24 DIAGNOSIS — I10 Essential (primary) hypertension: Secondary | ICD-10-CM | POA: Diagnosis not present

## 2018-11-24 DIAGNOSIS — F419 Anxiety disorder, unspecified: Secondary | ICD-10-CM | POA: Diagnosis not present

## 2018-11-24 DIAGNOSIS — Z801 Family history of malignant neoplasm of trachea, bronchus and lung: Secondary | ICD-10-CM

## 2018-11-24 DIAGNOSIS — M810 Age-related osteoporosis without current pathological fracture: Secondary | ICD-10-CM | POA: Diagnosis not present

## 2018-11-24 DIAGNOSIS — Z87891 Personal history of nicotine dependence: Secondary | ICD-10-CM | POA: Diagnosis not present

## 2018-11-24 DIAGNOSIS — Z90710 Acquired absence of both cervix and uterus: Secondary | ICD-10-CM | POA: Diagnosis not present

## 2018-11-24 DIAGNOSIS — R918 Other nonspecific abnormal finding of lung field: Secondary | ICD-10-CM | POA: Insufficient documentation

## 2018-11-24 DIAGNOSIS — J449 Chronic obstructive pulmonary disease, unspecified: Secondary | ICD-10-CM | POA: Insufficient documentation

## 2018-11-24 DIAGNOSIS — Z803 Family history of malignant neoplasm of breast: Secondary | ICD-10-CM | POA: Diagnosis not present

## 2018-11-24 DIAGNOSIS — Z806 Family history of leukemia: Secondary | ICD-10-CM | POA: Diagnosis not present

## 2018-11-24 DIAGNOSIS — F329 Major depressive disorder, single episode, unspecified: Secondary | ICD-10-CM | POA: Diagnosis not present

## 2018-11-24 DIAGNOSIS — M179 Osteoarthritis of knee, unspecified: Secondary | ICD-10-CM | POA: Diagnosis not present

## 2018-11-24 DIAGNOSIS — J440 Chronic obstructive pulmonary disease with acute lower respiratory infection: Secondary | ICD-10-CM | POA: Diagnosis not present

## 2018-11-24 DIAGNOSIS — J441 Chronic obstructive pulmonary disease with (acute) exacerbation: Secondary | ICD-10-CM | POA: Diagnosis not present

## 2018-11-24 DIAGNOSIS — J209 Acute bronchitis, unspecified: Secondary | ICD-10-CM | POA: Diagnosis not present

## 2018-11-24 NOTE — Patient Instructions (Signed)
[facility] Discharge Instructions     Thank you for choosing [facility] at [facility] to provide your oncology and hematology care.  To afford each patient quality time with our provider, please arrive at least 15 minutes before your scheduled appointment time.   If you have a lab appointment with the Cancer Center please come in thru the  Main Entrance and check in at the main information desk  You need to re-schedule your appointment should you arrive 10 or more minutes late.  We strive to give you quality time with our providers, and arriving late affects you and other patients whose appointments are after yours.  Also, if you no show three or more times for appointments you may be dismissed from the clinic at the providers discretion.     Again, thank you for choosing [facility].  Our hope is that these requests will decrease the amount of time that you wait before being seen by our physicians.       _____________________________________________________________  Should you have questions after your visit to  Cancer Center, please contact our office at [phone] between the hours of 8:00 a.m. and 4:30 p.m.  Voicemails left after 4:00 p.m. will not be returned until the following business day.  For prescription refill requests, have your pharmacy contact our office and allow 72 hours.    Cancer Center Support Programs:   > Cancer Support Group  2nd Tuesday of the month 1pm-2pm, Journey Room    

## 2018-11-24 NOTE — Assessment & Plan Note (Addendum)
1.  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  She was found to have an abnormal chest x-ray followed by CT scan which showed left upper lobe lung mass. - She smoked 1 to 2 packs of cigarettes per day for the last 50 years.  She quit smoking on 10/23/2018. - Denies any significant weight loss.  But has night sweats occasionally.  Denies any hemoptysis. - We reviewed the results of the PET CT scan dated 11/17/2018 which showed left upper lobe lung mass, 4.9 cm.  No evidence of adenopathy or distant metastasis. -MRI of the brain was also negative for intracranial metastasis. - Patient is able to do all her day-to-day activities without any problems.  She rarely uses oxygen at home. - I will make a referral to Dr. Roxan Hockey for pathological mediastinal lymph node evaluation and possible surgical exploration and resection if she is a surgical candidate. -If she is not a surgical candidate, she would require a biopsy for further treatment planning.  2.  Family history: -Mother had lung cancer and was never smoker.  Father had pancreatic cancer. -1 sister has colon cancer in 1 sister with leukemia.  Another sister had breast cancer. -Patient also has history of DCIS.  Hence I have recommended geneticist evaluation.

## 2018-11-24 NOTE — Progress Notes (Signed)
[facility] NOTE  Patient Care Team: Glenda Chroman, MD as PCP - General (Internal Medicine) Heath Lark, MD as Consulting Physician (Hematology and Oncology)  CHIEF COMPLAINTS/PURPOSE OF CONSULTATION: Left Lung Mass  HISTORY OF PRESENTING ILLNESS:  Laura Abbott 72 y.o. female is here because of a left lung mass found in a recent Emergency room visit. She went to the ER for increasing shortness of breath and they did a chest x-ray and Chest CT scan and found the mass. She reports having frequent headaches over the past few months. She did have an MRI of her brain completed. She reports numbness and tingling in her hands and feet. She also reports occasional night sweats. She denies a history of blood clots. Denies any weight loss. Denies any nausea, vomiting, or diarrhea. Denies any new pains. Had not noticed any recent bleeding such as epistaxis, hematuria or hematochezia. Denies recent chest pain on exertion, shortness of breath on minimal exertion, pre-syncopal episodes, or palpitations. Denies any numbness or tingling in hands or feet. Denies any recent fevers, infections, or recent hospitalizations.  She worked in [employer] her whole adult career. She is retired now but remains pretty active. She does all her own ADLs and household work. She drives a car and can walk long distances without easy fatigue. She does occasionally get off balance. Denies dizziness and reports it is mainly due to the numbness in her feet. She does has COPD that occasionally she has to wear home oxygen for. She has a postive history of cancer in her family. Her mother had lung cancer and she was a non smoker. Her father had pancreatic cancer. She also had one sister with colon cancer, one sister with leukemia, and one sister with breast cancer.  MEDICAL HISTORY:  Past Medical History:  Diagnosis Date  . Anxiety   . Arthritis   . Asthma   . 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 COPD  (chronic obstructive pulmonary disease) ([place])   . COPD exacerbation ([place]) 01/03/2014   With hypoxia  . Depression   . Emphysema of lung ([place])   . GERD (gastroesophageal reflux disease)   . Hyperlipidemia   . Hypertension   . Obesity 01/05/2014  . Osteoporosis   . Osteoporosis, unspecified 12/01/2013  . Tobacco abuse 12/01/2013    SURGICAL HISTORY: Past Surgical History:  Procedure Laterality Date  . ABDOMINAL HYSTERECTOMY    . BLADDER SURGERY     had tacked  . BREAST LUMPECTOMY WITH NEEDLE LOCALIZATION AND AXILLARY SENTINEL LYMPH NODE BX Right 11/09/2013   Procedure: BREAST LUMPECTOMY WITH NEEDLE LOCALIZATION AND AXILLARY SENTINEL LYMPH NODE BIOPSY;  Surgeon: Harl Bowie, MD;  Location: [facility];  Service: General;  Laterality: Right;  . BREAST SURGERY     lumpectomy  . CHOLECYSTECTOMY      SOCIAL HISTORY: Social History   Socioeconomic History  . Marital status: Married    Spouse name: Not on file  . Number of children: 3  . Years of education: Not on file  . Highest education level: Not on file  Occupational History  . Occupation: Database administrator    Comment: [employer]    Comment: [place]: [employer]  . Financial resource strain: Not hard at all  . Food insecurity:    Worry: Never true    Inability: Never true  . Transportation needs:    Medical: No    Non-medical:  No  Tobacco Use  . Smoking status: Former Smoker    Packs/day: 2.00    Years: 43.00    Pack years: 86.00    Types: Cigarettes    Last attempt to quit: 10/24/2018    Years since quitting: 0.0  . Smokeless tobacco: Never Used  Substance and Sexual Activity  . Alcohol use: No  . Drug use: No  . Sexual activity: Not Currently  Lifestyle  . Physical activity:    Days per week: 0 days    Minutes per session: 0 min  . Stress: Rather much  Relationships  . Social connections:    Talks on phone: More than three times a week    Gets together: More than three times  a week    Attends religious service: More than 4 times per year    Active member of club or organization: Yes    Attends meetings of clubs or organizations: More than 4 times per year    Relationship status: Married  . Intimate partner violence:    Fear of current or ex partner: No    Emotionally abused: No    Physically abused: No    Forced sexual activity: No  Other Topics Concern  . Not on file  Social History Narrative  . Not on file    FAMILY HISTORY: Family History  Problem Relation Age of Onset  . Cancer Mother        lung  . Cancer Father        pancreatic  . Leukemia Sister   . Heart disease Sister   . Cancer Sister        colon  . Heart disease Sister   . Cancer Maternal Aunt   . Cancer Sister        breast\  . Heart disease Sister   . Heart disease Brother   . Cirrhosis Brother   . Aneurysm Brother   . Fibromyalgia Daughter     ALLERGIES:  has No Known Allergies.  MEDICATIONS:  Current Outpatient Medications  Medication Sig Dispense Refill  . albuterol (PROVENTIL HFA;VENTOLIN HFA) 108 (90 BASE) MCG/ACT inhaler Inhale 2 puffs into the lungs 3 (three) times daily. Also use the inhaler every 2 hours if needed for worsening shortness of breath and wheezing. 1 Inhaler 3  . Calcium 600-200 MG-UNIT per tablet Take 1 tablet by mouth daily.    . clonazePAM (KLONOPIN) 0.5 MG tablet     . FLUoxetine (PROZAC) 40 MG capsule Take 40 mg by mouth daily.    Marland Kitchen HYDROcodone-acetaminophen (NORCO/VICODIN) 5-325 MG tablet Take 1 tablet by mouth every 6 (six) hours as needed for moderate pain.    Marland Kitchen lisinopril (PRINIVIL,ZESTRIL) 10 MG tablet Take 10 mg by mouth daily.    . metoprolol (LOPRESSOR) 50 MG tablet Take 50-75 mg by mouth 2 (two) times daily. Takes 75 mg in the morning and 50 mg in the evening    . omeprazole (PRILOSEC) 20 MG capsule Take 20 mg by mouth daily.     . traZODone (DESYREL) 50 MG tablet Take 50 mg by mouth at bedtime.     No current facility-administered  medications for this visit.     REVIEW OF SYSTEMS:   Constitutional: Denies fevers, chills or abnormal night sweats Eyes: Denies blurriness of vision, double vision or watery eyes Ears, nose, mouth, throat, and face: Denies mucositis or sore throat Respiratory: Denies cough, dyspnea or wheezes Cardiovascular: Denies palpitation, chest discomfort or lower extremity swelling Gastrointestinal:  Denies nausea, heartburn or change in bowel habits Skin: Denies abnormal skin rashes Lymphatics: Denies new lymphadenopathy or easy bruising Neurological:Denies numbness, tingling or new weaknesses. Frequent headaches. Behavioral/Psych: Mood is stable, no new changes  All other systems were reviewed with the patient and are negative.  PHYSICAL EXAMINATION: ECOG PERFORMANCE STATUS: 1 - Symptomatic but completely ambulatory  Vitals:   11/24/18 1303  BP: (!) 159/55  Pulse: 67  Resp: 18  Temp: 98.4 F (36.9 C)  SpO2: 94%   Filed Weights   11/24/18 1303  Weight: 206 lb (93.4 kg)    GENERAL:alert, no distress and comfortable SKIN: skin color, texture, turgor are normal, no rashes or significant lesions EYES: normal, conjunctiva are pink and non-injected, sclera clear OROPHARYNX:no exudate, no erythema and lips, buccal mucosa, and tongue normal  NECK: supple, thyroid normal size, non-tender, without nodularity LYMPH:  no palpable lymphadenopathy in the cervical, axillary or inguinal LUNGS: clear to auscultation and percussion with normal breathing effort HEART: regular rate & rhythm and no murmurs and no lower extremity edema ABDOMEN:abdomen soft, non-tender and normal bowel sounds Musculoskeletal:no cyanosis of digits and no clubbing  PSYCH: alert & oriented x 3 with fluent speech NEURO: no focal motor/sensory deficits  LABORATORY DATA:  I have reviewed the data as listed Lab Results  Component Value Date   WBC 8.4 11/26/2016   HGB 13.0 11/26/2016   HCT 40.4 11/26/2016   MCV 95.7  11/26/2016   PLT 283 11/26/2016     Chemistry      Component Value Date/Time   NA 138 11/26/2016 0523   K 4.2 11/27/2016 0550   CL 103 11/26/2016 0523   CO2 28 11/26/2016 0523   BUN 13 11/26/2016 0523   CREATININE 0.78 11/26/2016 0523      Component Value Date/Time   CALCIUM 9.2 11/26/2016 0523   ALKPHOS 76 11/26/2016 0523   AST 23 11/26/2016 0523   ALT 19 11/26/2016 0523   BILITOT 0.4 11/26/2016 0523       RADIOGRAPHIC STUDIES: I have personally reviewed the radiological images as listed and agreed with the findings in the report. Nm Pet Image Initial (pi) Skull Base To Thigh  Result Date: 11/18/2018 CLINICAL DATA:  Initial treatment strategy for lung mass. EXAM: NUCLEAR MEDICINE PET SKULL BASE TO THIGH TECHNIQUE: 11.86 mCi F-18 FDG was injected intravenously. Full-ring PET imaging was performed from the skull base to thigh after the radiotracer. CT data was obtained and used for attenuation correction and anatomic localization. Fasting blood glucose: 88 mg/dl COMPARISON:  CT chest 10/24/2018 FINDINGS: Mediastinal blood pool activity: SUV max 2.6 NECK: No hypermetabolic lymph nodes in the neck. Incidental CT findings: none CHEST: No hypermetabolic axillary or supraclavicular lymph nodes. No hypermetabolic mediastinal or hilar lymph nodes. Solid mass within the left upper lobe measures 4.9 cm and has an SUV max 18.35. No additional pulmonary nodules. Subsegmental atelectasis versus scar noted in the right middle lobe, lingula and both lower lobes. Incidental CT findings: Aortic atherosclerosis. Calcifications within the LAD, left circumflex and RCA coronary arteries noted. ABDOMEN/PELVIS: No abnormal hypermetabolic activity within the liver, pancreas, adrenal glands, or spleen. No hypermetabolic lymph nodes in the abdomen or pelvis. Incidental CT findings: Aortic atherosclerosis. Previous cholecystectomy. SKELETON: No focal hypermetabolic activity to suggest skeletal metastasis.  Incidental CT findings: none IMPRESSION: 1. Left upper lobe lung mass is intensely hypermetabolic compatible with primary bronchogenic carcinoma. Assuming a non-small cell histology imaging findings are compatible with T2aN0M0 disease or stage IB disease. 2.  Multi vessel coronary artery atherosclerotic disease. 3. Aortic Atherosclerosis (ICD10-I70.0). Electronically Signed   By: Kerby Moors M.D.   On: 11/18/2018 08:42    ASSESSMENT & PLAN:  Lung mass 1.  Clinical stage IIa (T2b N0 M0) left upper lobe lung mass: - Patient went to [facility] ER at [facility] in November with shortness of breath.  She was found to have an abnormal chest x-ray followed by CT scan which showed left upper lobe lung mass. - She smoked 1 to 2 packs of cigarettes per day for the last 50 years.  She quit smoking on 10/23/2018. - Denies any significant weight loss.  But has night sweats occasionally.  Denies any hemoptysis. - We reviewed the results of the PET CT scan dated 11/17/2018 which showed left upper lobe lung mass, 4.9 cm.  No evidence of adenopathy or distant metastasis. -MRI of the brain was also negative for intracranial metastasis. - Patient is able to do all her day-to-day activities without any problems.  She rarely uses oxygen at home. - I will make a referral to Dr. Roxan Hockey for pathological mediastinal lymph node evaluation and possible surgical exploration and resection if she is a surgical candidate. -If she is not a surgical candidate, she would require a biopsy for further treatment planning.  2.  Family history: -Mother had lung cancer and was never smoker.  Father had pancreatic cancer. -1 sister has colon cancer in 1 sister with leukemia.  Another sister had breast cancer. -Patient also has history of DCIS.  Hence I have recommended geneticist evaluation.  Orders Placed This Encounter  Procedures  . CBC with Differential/Platelet    Standing Status:   Future    Standing Expiration Date:   11/25/2019   . Comprehensive metabolic panel    Standing Status:   Future    Standing Expiration Date:   11/25/2019  . Ambulatory referral to Social Work    Referral Priority:   Routine    Referral Type:   Consultation    Referral Reason:   Specialty Services Required    Number of Visits Requested:   1    All questions were answered. The patient knows to call the clinic with any problems, questions or concerns.      Derek Jack, MD 11/24/2018 4:31 PM

## 2018-11-29 DIAGNOSIS — J441 Chronic obstructive pulmonary disease with (acute) exacerbation: Secondary | ICD-10-CM | POA: Diagnosis not present

## 2018-12-01 ENCOUNTER — Encounter: Payer: Self-pay | Admitting: Thoracic Surgery (Cardiothoracic Vascular Surgery)

## 2018-12-02 ENCOUNTER — Encounter (HOSPITAL_COMMUNITY): Payer: Self-pay | Admitting: General Practice

## 2018-12-02 ENCOUNTER — Encounter: Payer: Self-pay | Admitting: Genetic Counselor

## 2018-12-02 DIAGNOSIS — F329 Major depressive disorder, single episode, unspecified: Secondary | ICD-10-CM | POA: Diagnosis not present

## 2018-12-02 DIAGNOSIS — J440 Chronic obstructive pulmonary disease with acute lower respiratory infection: Secondary | ICD-10-CM | POA: Diagnosis not present

## 2018-12-02 DIAGNOSIS — M159 Polyosteoarthritis, unspecified: Secondary | ICD-10-CM | POA: Diagnosis not present

## 2018-12-02 DIAGNOSIS — F419 Anxiety disorder, unspecified: Secondary | ICD-10-CM | POA: Diagnosis not present

## 2018-12-02 DIAGNOSIS — J441 Chronic obstructive pulmonary disease with (acute) exacerbation: Secondary | ICD-10-CM | POA: Diagnosis not present

## 2018-12-02 DIAGNOSIS — J209 Acute bronchitis, unspecified: Secondary | ICD-10-CM | POA: Diagnosis not present

## 2018-12-02 DIAGNOSIS — I10 Essential (primary) hypertension: Secondary | ICD-10-CM | POA: Diagnosis not present

## 2018-12-02 DIAGNOSIS — M810 Age-related osteoporosis without current pathological fracture: Secondary | ICD-10-CM | POA: Diagnosis not present

## 2018-12-02 DIAGNOSIS — R918 Other nonspecific abnormal finding of lung field: Secondary | ICD-10-CM | POA: Diagnosis not present

## 2018-12-02 DIAGNOSIS — J449 Chronic obstructive pulmonary disease, unspecified: Secondary | ICD-10-CM | POA: Diagnosis not present

## 2018-12-02 DIAGNOSIS — M179 Osteoarthritis of knee, unspecified: Secondary | ICD-10-CM | POA: Diagnosis not present

## 2018-12-02 NOTE — Progress Notes (Signed)
[facility] Psychosocial Distress Screening Clinical Social Work  Clinical Social Work was referred by distress screening protocol.  The patient scored a 5 on the Psychosocial Distress Thermometer which indicates moderate distress. Clinical Social Worker contacted patient by phone to assess for distress and other psychosocial needs. "Is doing OK, but I have had quite a lot of back pain, being weak and tired."  "I have a wonderful support system, I live w my husband."  Able to drive at present, can depend on husband for help w driving if needed.  Discussed support services in [facility] including Support Group, cooking class and [employer], will mail information packet.  Encouraged patient to contact CSW as needed.    ONCBCN DISTRESS SCREENING 11/24/2018  Screening Type Initial Screening  Distress experienced in past week (1-10) 5  Emotional problem type Nervousness/Anxiety;Adjusting to illness  Information Concerns Type Lack of info about diagnosis;Lack of info about treatment;Lack of info about complementary therapy choices;Lack of info about maintaining fitness    Clinical Social Worker follow up needed: No.  If yes, follow up plan:  Beverely Pace, [place], LCSW Clinical Social Worker Phone:  [phone]

## 2018-12-03 DIAGNOSIS — J440 Chronic obstructive pulmonary disease with acute lower respiratory infection: Secondary | ICD-10-CM | POA: Diagnosis not present

## 2018-12-03 DIAGNOSIS — I10 Essential (primary) hypertension: Secondary | ICD-10-CM | POA: Diagnosis not present

## 2018-12-03 DIAGNOSIS — J209 Acute bronchitis, unspecified: Secondary | ICD-10-CM | POA: Diagnosis not present

## 2018-12-03 DIAGNOSIS — J441 Chronic obstructive pulmonary disease with (acute) exacerbation: Secondary | ICD-10-CM | POA: Diagnosis not present

## 2018-12-04 ENCOUNTER — Other Ambulatory Visit: Payer: Self-pay

## 2018-12-04 ENCOUNTER — Institutional Professional Consult (permissible substitution): Payer: Medicare HMO | Admitting: Thoracic Surgery (Cardiothoracic Vascular Surgery)

## 2018-12-04 ENCOUNTER — Encounter: Payer: Self-pay | Admitting: Thoracic Surgery (Cardiothoracic Vascular Surgery)

## 2018-12-04 ENCOUNTER — Encounter (HOSPITAL_COMMUNITY): Payer: Self-pay | Admitting: Genetic Counselor

## 2018-12-04 ENCOUNTER — Inpatient Hospital Stay (HOSPITAL_COMMUNITY): Payer: Medicare HMO

## 2018-12-04 ENCOUNTER — Inpatient Hospital Stay (HOSPITAL_COMMUNITY): Payer: Medicare HMO | Attending: Hematology | Admitting: Genetic Counselor

## 2018-12-04 VITALS — BP 130/82 | HR 74 | Resp 18 | Ht 62.0 in | Wt 203.0 lb

## 2018-12-04 DIAGNOSIS — R918 Other nonspecific abnormal finding of lung field: Secondary | ICD-10-CM | POA: Insufficient documentation

## 2018-12-04 DIAGNOSIS — Z803 Family history of malignant neoplasm of breast: Secondary | ICD-10-CM | POA: Diagnosis not present

## 2018-12-04 DIAGNOSIS — Z800 Family history of malignant neoplasm of digestive organs: Secondary | ICD-10-CM

## 2018-12-04 DIAGNOSIS — Z8051 Family history of malignant neoplasm of kidney: Secondary | ICD-10-CM | POA: Diagnosis not present

## 2018-12-04 DIAGNOSIS — Z801 Family history of malignant neoplasm of trachea, bronchus and lung: Secondary | ICD-10-CM | POA: Insufficient documentation

## 2018-12-04 DIAGNOSIS — Z853 Personal history of malignant neoplasm of breast: Secondary | ICD-10-CM | POA: Diagnosis not present

## 2018-12-04 LAB — COMPREHENSIVE METABOLIC PANEL
ALK PHOS: 59 U/L (ref 38–126)
ALT: 20 U/L (ref 0–44)
AST: 19 U/L (ref 15–41)
Albumin: 4 g/dL (ref 3.5–5.0)
Anion gap: 8 (ref 5–15)
BILIRUBIN TOTAL: 0.5 mg/dL (ref 0.3–1.2)
BUN: 14 mg/dL (ref 8–23)
CALCIUM: 8.9 mg/dL (ref 8.9–10.3)
CO2: 28 mmol/L (ref 22–32)
Chloride: 103 mmol/L (ref 98–111)
Creatinine, Ser: 0.49 mg/dL (ref 0.44–1.00)
Glucose, Bld: 108 mg/dL — ABNORMAL HIGH (ref 70–99)
Potassium: 4.2 mmol/L (ref 3.5–5.1)
SODIUM: 139 mmol/L (ref 135–145)
TOTAL PROTEIN: 7.8 g/dL (ref 6.5–8.1)

## 2018-12-04 LAB — CBC WITH DIFFERENTIAL/PLATELET
Abs Immature Granulocytes: 0.02 10*3/uL (ref 0.00–0.07)
BASOS PCT: 1 %
Basophils Absolute: 0 10*3/uL (ref 0.0–0.1)
EOS ABS: 0.1 10*3/uL (ref 0.0–0.5)
Eosinophils Relative: 1 %
HCT: 46 % (ref 36.0–46.0)
HEMOGLOBIN: 14.2 g/dL (ref 12.0–15.0)
Immature Granulocytes: 0 %
LYMPHS ABS: 1.9 10*3/uL (ref 0.7–4.0)
Lymphocytes Relative: 24 %
MCH: 29.6 pg (ref 26.0–34.0)
MCHC: 30.9 g/dL (ref 30.0–36.0)
MCV: 96 fL (ref 80.0–100.0)
Monocytes Absolute: 0.5 10*3/uL (ref 0.1–1.0)
Monocytes Relative: 7 %
NRBC: 0 % (ref 0.0–0.2)
Neutro Abs: 5.5 10*3/uL (ref 1.7–7.7)
Neutrophils Relative %: 67 %
Platelets: 276 10*3/uL (ref 150–400)
RBC: 4.79 MIL/uL (ref 3.87–5.11)
RDW: 13.4 % (ref 11.5–15.5)
WBC: 8.1 10*3/uL (ref 4.0–10.5)

## 2018-12-04 NOTE — Progress Notes (Signed)
PCP is Glenda Chroman, MD Referring Provider is Derek Jack, MD  Chief Complaint  Patient presents with  . Lung Mass    new patient consultation, PET 11/18/2018    HPI: Laura Abbott is sent for consultation regarding a left upper lobe lung mass  Laura Abbott is a 73 year old woman with a history of tobacco abuse, COPD, bronchitis, anxiety, depression, hypertension, hyperlipidemia, obesity, osteoporosis, and reflux.  She had DCIS of the right breast.  She started smoking in her late 81s.  She smoked up to 2 packs/day but on average about 1/2 packs/day for 42 years.  (60 pack years) back around Thanksgiving she was having some shortness of breath associated with an episode of bronchitis.  A chest x-ray showed a left upper lobe mass.  A PET CT showed a 4.5 cm left upper lobe mass that was markedly hypermetabolic with an SUV of 18.  There was no evidence of regional or distant metastatic disease.  She has had recurrent episodes of bronchitis.  She was hospitalized back in 2018.  She often will have to use oxygen around those episodes, but is not on it on a regular basis.  She quit smoking back in November 2019.  She has occasional sharp pains under her breasts on either side that are not associated with exertion or any other inciting factor.  Other than that she does not have any chest pain, pressure, or tightness with exertion.  She does get short of breath with heavy exertion.  She says she walks around [place] without any issues and she can walk up a flight of stairs.  She has not had any change in appetite or weight loss.  She has not had any headaches or visual changes.  She does not have any new bone or joint pain. Zubrod Score: At the time of surgery this patient's most appropriate activity status/level should be described as: []     0    Normal activity, no symptoms [x]     1    Restricted in physical strenuous activity but ambulatory, able to do out light work []     2    Ambulatory and  capable of self care, unable to do work activities, up and about >50 % of waking hours                              []     3    Only limited self care, in bed greater than 50% of waking hours []     4    Completely disabled, no self care, confined to bed or chair []     5    Moribund  Past Medical History:  Diagnosis Date  . Anxiety   . Arthritis   . Asthma   . Breast cancer ([place])    breast, right DCIS  . COPD (chronic obstructive pulmonary disease) ([place])   . COPD exacerbation ([place]) 01/03/2014   With hypoxia  . Depression   . Emphysema of lung ([place])   . Family history of breast cancer   . Family history of colon cancer   . Family history of kidney cancer   . Family history of lung cancer   . Family history of pancreatic cancer   . GERD (gastroesophageal reflux disease)   . Hyperlipidemia   . Hypertension   . Obesity 01/05/2014  . Osteoporosis   . Osteoporosis, unspecified 12/01/2013  . Tobacco abuse 12/01/2013    Past  Surgical History:  Procedure Laterality Date  . ABDOMINAL HYSTERECTOMY    . BLADDER SURGERY     had tacked  . BREAST LUMPECTOMY WITH NEEDLE LOCALIZATION AND AXILLARY SENTINEL LYMPH NODE BX Right 11/09/2013   Procedure: BREAST LUMPECTOMY WITH NEEDLE LOCALIZATION AND AXILLARY SENTINEL LYMPH NODE BIOPSY;  Surgeon: Harl Bowie, MD;  Location: [facility];  Service: General;  Laterality: Right;  . BREAST SURGERY     lumpectomy  . CHOLECYSTECTOMY      Family History  Problem Relation Age of Onset  . Lung cancer Mother 34       non smoker  . Pancreatic cancer Father 42       d. 65  . Leukemia Sister        dx in her 70s  . Heart disease Sister   . Colon cancer Sister        dx and died in her 32s  . Heart disease Sister   . Lung cancer Maternal Aunt        non-smoker  . Heart disease Sister   . Breast cancer Sister        dx early 59s  . Heart disease Brother   . Cirrhosis Brother        d. 54  . Aneurysm Brother        d. 26s  . Fibromyalgia Daughter    . Kidney cancer Nephew 71  . Kidney cancer Nephew        dx in 54s; great nephew  . Leukemia Nephew   . Liver disease Nephew   . Other Niece        white matter on brain; dx late 16s    Social History Social History   Tobacco Use  . Smoking status: Former Smoker    Packs/day: 2.00    Years: 43.00    Pack years: 86.00    Types: Cigarettes    Last attempt to quit: 10/24/2018    Years since quitting: 0.1  . Smokeless tobacco: Never Used  Substance Use Topics  . Alcohol use: No  . Drug use: No    Current Outpatient Medications  Medication Sig Dispense Refill  . albuterol (PROVENTIL HFA;VENTOLIN HFA) 108 (90 BASE) MCG/ACT inhaler Inhale 2 puffs into the lungs 3 (three) times daily. Also use the inhaler every 2 hours if needed for worsening shortness of breath and wheezing. 1 Inhaler 3  . Calcium 600-200 MG-UNIT per tablet Take 1 tablet by mouth daily.    . clonazePAM (KLONOPIN) 0.5 MG tablet     . FLUoxetine (PROZAC) 40 MG capsule Take 40 mg by mouth daily.    . fluticasone (FLONASE) 50 MCG/ACT nasal spray Place 2 sprays into both nostrils daily.    . fluticasone furoate-vilanterol (BREO ELLIPTA) 100-25 MCG/INH AEPB Inhale 1 puff into the lungs daily.    Marland Kitchen HYDROcodone-acetaminophen (NORCO/VICODIN) 5-325 MG tablet Take 1 tablet by mouth every 6 (six) hours as needed for moderate pain.    Marland Kitchen lisinopril (PRINIVIL,ZESTRIL) 10 MG tablet Take 10 mg by mouth daily.    . metoprolol (LOPRESSOR) 50 MG tablet Take 50-75 mg by mouth 2 (two) times daily. Takes 75 mg in the morning and 50 mg in the evening    . omeprazole (PRILOSEC) 20 MG capsule Take 20 mg by mouth daily.     . traZODone (DESYREL) 50 MG tablet Take 50 mg by mouth at bedtime.     No current facility-administered medications for this visit.  No Known Allergies  Review of Systems  Constitutional: Negative for activity change, appetite change and unexpected weight change.  HENT: Positive for hearing loss. Negative for  trouble swallowing and voice change.   Eyes: Negative for visual disturbance.  Respiratory: Positive for shortness of breath (heavy exertion). Negative for cough and wheezing.   Cardiovascular: Positive for chest pain (sharp either side, not exertional) and leg swelling. Negative for palpitations.  Gastrointestinal: Positive for abdominal pain (reflux). Negative for abdominal distention.  Genitourinary: Negative for difficulty urinating and dysuria.       Kidney stones  Musculoskeletal: Positive for arthralgias, back pain and joint swelling.  Neurological: Negative for seizures, syncope and weakness.  Hematological: Negative for adenopathy. Bruises/bleeds easily.  Psychiatric/Behavioral: Positive for dysphoric mood. The patient is nervous/anxious.   All other systems reviewed and are negative.   BP 130/82 (BP Location: Left Arm, Patient Position: Sitting, Cuff Size: Normal)   Pulse 74   Resp 18   Ht 5\' 2"  (1.575 m)   Wt 203 lb (92.1 kg)   SpO2 95% Comment: RA  BMI 37.13 kg/m  Physical Exam Vitals signs reviewed.  Constitutional:      General: She is not in acute distress.    Appearance: She is obese.  HENT:     Head: Normocephalic and atraumatic.  Eyes:     General: No scleral icterus.    Extraocular Movements: Extraocular movements intact.  Neck:     Musculoskeletal: Neck supple.  Cardiovascular:     Rate and Rhythm: Normal rate.     Heart sounds: Murmur (2/6 systolic murmur) present.  Pulmonary:     Effort: Pulmonary effort is normal. No respiratory distress.     Breath sounds: No wheezing or rales.     Comments: Diminished BS bilaterally Abdominal:     General: There is no distension.     Tenderness: There is no abdominal tenderness.  Musculoskeletal:        General: No swelling or deformity.  Lymphadenopathy:     Cervical: No cervical adenopathy.  Skin:    General: Skin is warm and dry.     Capillary Refill: Capillary refill takes less than 2 seconds.   Neurological:     General: No focal deficit present.     Mental Status: She is alert and oriented to person, place, and time.     Cranial Nerves: No cranial nerve deficit.     Motor: No weakness.    Diagnostic Tests: NUCLEAR MEDICINE PET SKULL BASE TO THIGH  TECHNIQUE: 11.86 mCi F-18 FDG was injected intravenously. Full-ring PET imaging was performed from the skull base to thigh after the radiotracer. CT data was obtained and used for attenuation correction and anatomic localization.  Fasting blood glucose: 88 mg/dl  COMPARISON:  CT chest 10/24/2018  FINDINGS: Mediastinal blood pool activity: SUV max 2.6  NECK: No hypermetabolic lymph nodes in the neck.  Incidental CT findings: none  CHEST: No hypermetabolic axillary or supraclavicular lymph nodes. No hypermetabolic mediastinal or hilar lymph nodes.  Solid mass within the left upper lobe measures 4.9 cm and has an SUV max 18.35. No additional pulmonary nodules. Subsegmental atelectasis versus scar noted in the right middle lobe, lingula and both lower lobes.  Incidental CT findings: Aortic atherosclerosis. Calcifications within the LAD, left circumflex and RCA coronary arteries noted.  ABDOMEN/PELVIS: No abnormal hypermetabolic activity within the liver, pancreas, adrenal glands, or spleen. No hypermetabolic lymph nodes in the abdomen or pelvis.  Incidental CT findings: Aortic atherosclerosis.  Previous cholecystectomy.  SKELETON: No focal hypermetabolic activity to suggest skeletal metastasis.  Incidental CT findings: none  IMPRESSION: 1. Left upper lobe lung mass is intensely hypermetabolic compatible with primary bronchogenic carcinoma. Assuming a non-small cell histology imaging findings are compatible with T2aN0M0 disease or stage IB disease. 2. Multi vessel coronary artery atherosclerotic disease. 3. Aortic Atherosclerosis (ICD10-I70.0).   Electronically Signed   By: Kerby Moors  M.D.   On: 11/18/2018 08:42 I personally reviewed the PET/CT images and concur with the findings noted above  Impression: Laura Abbott is a 73 year old woman with a history of tobacco abuse, COPD with bronchitis, hypertension, hyperlipidemia, obesity, anxiety and depression.  She has a 4.5 cm mass in the left upper lobe that is markedly hypermetabolic on PET/CT with an SUV of 18.  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  Clinical stage would be 1B(T2, N0, M0).  I discussed potential treatment options including surgical resection versus radiation and chemotherapy.  If she opts for surgical resection she probably would need adjuvant chemotherapy due to the tumor size.  We discussed the relative advantages and disadvantages to those approaches.  I described the proposed operation to Mrs. Ovitt and her daughter.  I informed them of the general nature of the procedure including the need for general anesthesia, the incisions to be used, the use of a drainage tube postoperatively, the effect on her pulmonary function, the expected hospital stay, and the overall recovery.  I informed them of the indications, risk, benefits, and alternatives.  They understand the risks include, but are not limited to death, MI, DVT, PE, bleeding, possible need for transfusion, infection, prolonged air leak, cardiac arrhythmias, as well as the possibility of other unforeseeable complications.  She does not have any cardiac history.  She does have some coronary calcification on CT.  She does not have any symptoms consistent with angina.  There is no indication for additional cardiac work-up prior to surgery.  From her history she gives an account that she should be able to tolerate lobectomy.  However, she has not yet had pulmonary function testing.  We definitely need to assess that prior to final decision.  Needs testing both with and without bronchodilators and also needs a room air blood gas.  She wishes  to proceed with surgery should her pulmonary function be adequate.  Plan: Pulmonary function testing with and without bronchodilators and with room air ABG Tentatively plan for left VATS, left upper lobectomy on Monday, 12/15/2018  Melrose Nakayama, MD [facility] [phone]

## 2018-12-04 NOTE — Progress Notes (Signed)
REFERRING PROVIDER: Derek Jack, MD [address] [place], [place] [address]  PRIMARY PROVIDER:  Glenda Chroman, MD  PRIMARY REASON FOR VISIT:  1. Family history of breast cancer   2. Family history of pancreatic cancer   3. Family history of lung cancer   4. Family history of kidney cancer   5. Family history of colon cancer      HISTORY OF PRESENT ILLNESS:   [name], a 73 y.o. female, was seen for a [place] cancer genetics consultation at the request of Dr. Delton Coombes due to a personal and family history of cancer.  Ms. Luepke presents to clinic today to discuss the possibility of a hereditary predisposition to cancer, genetic testing, and to further clarify her future cancer risks, as well as potential cancer risks for family members.   In 2012, at the age of 62, Laura Abbott was diagnosed with DCIS of the right breast. This was treated with lumpectomy and radiation.  In 2019, at the age of 23, Laura Abbott was diagnosed with a primary lung cancer.  She reports smoking for 40 years, but that she has only 'inhaled' one time.   CANCER HISTORY:   No history exists.     HORMONAL RISK FACTORS:  Menarche was at age 62.  First live birth at age 89.  OCP use for approximately <1 years.  Ovaries intact: no.  Hysterectomy: yes.  Menopausal status: postmenopausal.  Colonoscopy: yes; normal. Mammogram within the last year: no. Number of breast biopsies: 1. Up to date with pelvic exams:  n/a. Any excessive radiation exposure in the past:  yes  Past Medical History:  Diagnosis Date  . Anxiety   . Arthritis   . Asthma   . Breast cancer ([place])    breast, right DCIS  . COPD (chronic obstructive pulmonary disease) ([place])   . COPD exacerbation ([place]) 01/03/2014   With hypoxia  . Depression   . Emphysema of lung ([place])   . Family history of breast cancer   . Family history of colon cancer   . Family history of kidney cancer   . Family history of lung cancer   . Family history of  pancreatic cancer   . GERD (gastroesophageal reflux disease)   . Hyperlipidemia   . Hypertension   . Obesity 01/05/2014  . Osteoporosis   . Osteoporosis, unspecified 12/01/2013  . Tobacco abuse 12/01/2013    Past Surgical History:  Procedure Laterality Date  . ABDOMINAL HYSTERECTOMY    . BLADDER SURGERY     had tacked  . BREAST LUMPECTOMY WITH NEEDLE LOCALIZATION AND AXILLARY SENTINEL LYMPH NODE BX Right 11/09/2013   Procedure: BREAST LUMPECTOMY WITH NEEDLE LOCALIZATION AND AXILLARY SENTINEL LYMPH NODE BIOPSY;  Surgeon: Harl Bowie, MD;  Location: [facility];  Service: General;  Laterality: Right;  . BREAST SURGERY     lumpectomy  . CHOLECYSTECTOMY      Social History   Socioeconomic History  . Marital status: Married    Spouse name: Not on file  . Number of children: 3  . Years of education: Not on file  . Highest education level: Not on file  Occupational History  . Occupation: Database administrator    Comment: [employer]    Comment: [place]: [employer]  . Financial resource strain: Not hard at all  . Food insecurity:    Worry: Never true    Inability: Never true  . Transportation needs:  Medical: No    Non-medical: No  Tobacco Use  . Smoking status: Former Smoker    Packs/day: 2.00    Years: 43.00    Pack years: 86.00    Types: Cigarettes    Last attempt to quit: 10/24/2018    Years since quitting: 0.1  . Smokeless tobacco: Never Used  Substance and Sexual Activity  . Alcohol use: No  . Drug use: No  . Sexual activity: Not Currently  Lifestyle  . Physical activity:    Days per week: 0 days    Minutes per session: 0 min  . Stress: Rather much  Relationships  . Social connections:    Talks on phone: More than three times a week    Gets together: More than three times a week    Attends religious service: More than 4 times per year    Active member of club or organization: Yes    Attends meetings of clubs or organizations:  More than 4 times per year    Relationship status: Married  Other Topics Concern  . Not on file  Social History Narrative  . Not on file     FAMILY HISTORY:  We obtained a detailed, 4-generation family history.  Significant diagnoses are listed below: Family History  Problem Relation Age of Onset  . Lung cancer Mother 5       non smoker  . Pancreatic cancer Father 60       d. 7  . Leukemia Sister        dx in her 106s  . Heart disease Sister   . Colon cancer Sister        dx and died in her 96s  . Heart disease Sister   . Lung cancer Maternal Aunt        non-smoker  . Heart disease Sister   . Breast cancer Sister        dx early 43s  . Heart disease Brother   . Cirrhosis Brother        d. 44  . Aneurysm Brother        d. 22s  . Fibromyalgia Daughter   . Kidney cancer Nephew 71  . Kidney cancer Nephew        dx in 11s; great nephew  . Leukemia Nephew   . Liver disease Nephew   . Other Niece        white matter on brain; dx late 48s    The patient has two daughters and one son who are cancer free.  She has five sisters and five brothers.  One sister was diagnosed with colon cancer in her 74's-60's, a sister was diagnosed with leukemia in her 24's, and a third sister was diagnosed with breast cancer in her early 71's.  One brother died at 13 from liver cirrhosis and he had a son with leukemia.  Another brother died with AAA.  One sister has a son who was diagnosed with kidney cancer, and this son had a son who died of kidney cancer.  Both parents are deceased.  The patient's mother was a non-smoker and died of lung cancer.  She had five brothers and a sister.  Her sister who was a non-smoker died of lung cancer.  The maternal grandparents are deceased from non cancer related issues.  The patient's father died of pancreatic cancer.  He had a sister and brother who were cancer free.  He reportedly had several paternal half brothers, but nothing is  known about them.  The  paternal grandparents are deceased.  Ms. Governale is unaware of previous family history of genetic testing for hereditary cancer risks. Patient's maternal ancestors are of Caucasian descent, and paternal ancestors are of Caucasian descent. There is no reported Ashkenazi Jewish ancestry. There is no known consanguinity.  GENETIC COUNSELING ASSESSMENT: LEYNA VANDERKOLK is a 73 y.o. female with a personal and family history of cancer which is somewhat suggestive of a hereditary cancer syndrome and predisposition to cancer. We, therefore, discussed and recommended the following at today's visit.   DISCUSSION: We discussed that about 5-10% of breast cancer patient's are due to hereditary cancer syndrome, most commonly due to BRCA mutations.  Other genes are associated with both breast and pancreatic cancer including ATM and PALB2.  We discussed that EGFR mutations are very rare, but can sometimes be identified in families with lung cancer in non-smokers.  Overall, there are many genes that can increase the risk for different cancers, but it is hard to link all of the cancers in her family with one particular syndrome.  We reviewed the characteristics, features and inheritance patterns of hereditary cancer syndromes. We also discussed genetic testing, including the appropriate family members to test, the process of testing, insurance coverage and turn-around-time for results. We discussed the implications of a negative, positive and/or variant of uncertain significant result. We recommended Ms. Allaire pursue genetic testing for the multi cancer gene panel. The Multi-Gene Panel offered by [facility] includes sequencing and/or deletion duplication testing of the following 84 genes: AIP, ALK, APC, ATM, AXIN2,BAP1,  BARD1, BLM, BMPR1A, BRCA1, BRCA2, BRIP1, CASR, CDC73, CDH1, CDK4, CDKN1B, CDKN1C, CDKN2A (p14ARF), CDKN2A (p16INK4a), CEBPA, CHEK2, CTNNA1, DICER1, DIS3L2, EGFR (c.2369C>T, p.Thr790Met variant only), EPCAM  (Deletion/duplication testing only), FH, FLCN, GATA2, GPC3, GREM1 (Promoter region deletion/duplication testing only), HOXB13 (c.251G>A, p.Gly84Glu), HRAS, KIT, MAX, MEN1, MET, MITF (c.952G>A, p.Glu318Lys variant only), MLH1, MSH2, MSH3, MSH6, MUTYH, NBN, NF1, NF2, NTHL1, PALB2, PDGFRA, PHOX2B, PMS2, POLD1, POLE, POT1, PRKAR1A, PTCH1, PTEN, RAD50, RAD51C, RAD51D, RB1, RECQL4, RET, RUNX1, SDHAF2, SDHA (sequence changes only), SDHB, SDHC, SDHD, SMAD4, SMARCA4, SMARCB1, SMARCE1, STK11, SUFU, TERC, TERT, TMEM127, TP53, TSC1, TSC2, VHL, WRN and WT1.    Based on Ms. Valliant's personal and family history of cancer, she meets medical criteria for genetic testing. Despite that she meets criteria, she may still have an out of pocket cost. We discussed that if her out of pocket cost for testing is over $100, the laboratory will call and confirm whether she wants to proceed with testing.  If the out of pocket cost of testing is less than $100 she will be billed by the genetic testing laboratory.    PLAN: After considering the risks, benefits, and limitations, Ms. Podgorski  provided informed consent to pursue genetic testing and the blood sample was sent to [facility] for analysis of the Multi cancer. Results should be available within approximately 2-3 weeks' time, at which point they will be disclosed by telephone to Ms. Burnside, as will any additional recommendations warranted by these results. Ms. Grindle will receive a summary of her genetic counseling visit and a copy of her results once available. This information will also be available in Epic. We encouraged Ms. Blackie to remain in contact with cancer genetics annually so that we can continuously update the family history and inform her of any changes in cancer genetics and testing that may be of benefit for her family. Ms. Duchemin questions were answered to her satisfaction today.  Our contact information was provided should additional questions or concerns  arise.  Lastly, we encouraged Ms. Lawson to remain in contact with cancer genetics annually so that we can continuously update the family history and inform her of any changes in cancer genetics and testing that may be of benefit for this family.   Ms.  Cookston questions were answered to her satisfaction today. Our contact information was provided should additional questions or concerns arise. Thank you for the referral and allowing [place] to share in the care of your patient.   Fatma Rutten P. Florene Glen, [place], [facility] Certified Genetic Counselor [email or web address] phone: [phone]  The patient was seen for a total of 45 minutes in face-to-face genetic counseling.  This patient was discussed with Drs. Magrinat, Lindi Adie and/or Burr Medico who agrees with the above.    _______________________________________________________________________ For Office Staff:  Number of people involved in session: 1 Was an Intern/ student involved with case: no

## 2018-12-04 NOTE — H&P (View-Only) (Signed)
PCP is Glenda Chroman, MD Referring Provider is Derek Jack, MD  Chief Complaint  Patient presents with  . Lung Mass    new patient consultation, PET 11/18/2018    HPI: Laura Abbott is sent for consultation regarding a left upper lobe lung mass  Laura Abbott is a 73 year old woman with a history of tobacco abuse, COPD, bronchitis, anxiety, depression, hypertension, hyperlipidemia, obesity, osteoporosis, and reflux.  She had DCIS of the right breast.  She started smoking in her late 36s.  She smoked up to 2 packs/day but on average about 1/2 packs/day for 42 years.  (60 pack years) back around Thanksgiving she was having some shortness of breath associated with an episode of bronchitis.  A chest x-ray showed a left upper lobe mass.  A PET CT showed a 4.5 cm left upper lobe mass that was markedly hypermetabolic with an SUV of 18.  There was no evidence of regional or distant metastatic disease.  She has had recurrent episodes of bronchitis.  She was hospitalized back in 2018.  She often will have to use oxygen around those episodes, but is not on it on a regular basis.  She quit smoking back in November 2019.  She has occasional sharp pains under her breasts on either side that are not associated with exertion or any other inciting factor.  Other than that she does not have any chest pain, pressure, or tightness with exertion.  She does get short of breath with heavy exertion.  She says she walks around [place] without any issues and she can walk up a flight of stairs.  She has not had any change in appetite or weight loss.  She has not had any headaches or visual changes.  She does not have any new bone or joint pain. Laura Abbott Score: At the time of surgery this patient's most appropriate activity status/level should be described as: []     0    Normal activity, no symptoms [x]     1    Restricted in physical strenuous activity but ambulatory, able to do out light work []     2    Ambulatory and  capable of self care, unable to do work activities, up and about >50 % of waking hours                              []     3    Only limited self care, in bed greater than 50% of waking hours []     4    Completely disabled, no self care, confined to bed or chair []     5    Moribund  Past Medical History:  Diagnosis Date  . Anxiety   . Arthritis   . Asthma   . Breast cancer ([place])    breast, right DCIS  . COPD (chronic obstructive pulmonary disease) ([place])   . COPD exacerbation ([place]) 01/03/2014   With hypoxia  . Depression   . Emphysema of lung ([place])   . Family history of breast cancer   . Family history of colon cancer   . Family history of kidney cancer   . Family history of lung cancer   . Family history of pancreatic cancer   . GERD (gastroesophageal reflux disease)   . Hyperlipidemia   . Hypertension   . Obesity 01/05/2014  . Osteoporosis   . Osteoporosis, unspecified 12/01/2013  . Tobacco abuse 12/01/2013    Past  Surgical History:  Procedure Laterality Date  . ABDOMINAL HYSTERECTOMY    . BLADDER SURGERY     had tacked  . BREAST LUMPECTOMY WITH NEEDLE LOCALIZATION AND AXILLARY SENTINEL LYMPH NODE BX Right 11/09/2013   Procedure: BREAST LUMPECTOMY WITH NEEDLE LOCALIZATION AND AXILLARY SENTINEL LYMPH NODE BIOPSY;  Surgeon: Harl Bowie, MD;  Location: [facility];  Service: General;  Laterality: Right;  . BREAST SURGERY     lumpectomy  . CHOLECYSTECTOMY      Family History  Problem Relation Age of Onset  . Lung cancer Mother 59       non smoker  . Pancreatic cancer Father 11       d. 24  . Leukemia Sister        dx in her 39s  . Heart disease Sister   . Colon cancer Sister        dx and died in her 69s  . Heart disease Sister   . Lung cancer Maternal Aunt        non-smoker  . Heart disease Sister   . Breast cancer Sister        dx early 29s  . Heart disease Brother   . Cirrhosis Brother        d. 13  . Aneurysm Brother        d. 39s  . Fibromyalgia Daughter    . Kidney cancer Nephew 71  . Kidney cancer Nephew        dx in 86s; great nephew  . Leukemia Nephew   . Liver disease Nephew   . Other Niece        white matter on brain; dx late 41s    Social History Social History   Tobacco Use  . Smoking status: Former Smoker    Packs/day: 2.00    Years: 43.00    Pack years: 86.00    Types: Cigarettes    Last attempt to quit: 10/24/2018    Years since quitting: 0.1  . Smokeless tobacco: Never Used  Substance Use Topics  . Alcohol use: No  . Drug use: No    Current Outpatient Medications  Medication Sig Dispense Refill  . albuterol (PROVENTIL HFA;VENTOLIN HFA) 108 (90 BASE) MCG/ACT inhaler Inhale 2 puffs into the lungs 3 (three) times daily. Also use the inhaler every 2 hours if needed for worsening shortness of breath and wheezing. 1 Inhaler 3  . Calcium 600-200 MG-UNIT per tablet Take 1 tablet by mouth daily.    . clonazePAM (KLONOPIN) 0.5 MG tablet     . FLUoxetine (PROZAC) 40 MG capsule Take 40 mg by mouth daily.    . fluticasone (FLONASE) 50 MCG/ACT nasal spray Place 2 sprays into both nostrils daily.    . fluticasone furoate-vilanterol (BREO ELLIPTA) 100-25 MCG/INH AEPB Inhale 1 puff into the lungs daily.    Marland Kitchen HYDROcodone-acetaminophen (NORCO/VICODIN) 5-325 MG tablet Take 1 tablet by mouth every 6 (six) hours as needed for moderate pain.    Marland Kitchen lisinopril (PRINIVIL,ZESTRIL) 10 MG tablet Take 10 mg by mouth daily.    . metoprolol (LOPRESSOR) 50 MG tablet Take 50-75 mg by mouth 2 (two) times daily. Takes 75 mg in the morning and 50 mg in the evening    . omeprazole (PRILOSEC) 20 MG capsule Take 20 mg by mouth daily.     . traZODone (DESYREL) 50 MG tablet Take 50 mg by mouth at bedtime.     No current facility-administered medications for this visit.  No Known Allergies  Review of Systems  Constitutional: Negative for activity change, appetite change and unexpected weight change.  HENT: Positive for hearing loss. Negative for  trouble swallowing and voice change.   Eyes: Negative for visual disturbance.  Respiratory: Positive for shortness of breath (heavy exertion). Negative for cough and wheezing.   Cardiovascular: Positive for chest pain (sharp either side, not exertional) and leg swelling. Negative for palpitations.  Gastrointestinal: Positive for abdominal pain (reflux). Negative for abdominal distention.  Genitourinary: Negative for difficulty urinating and dysuria.       Kidney stones  Musculoskeletal: Positive for arthralgias, back pain and joint swelling.  Neurological: Negative for seizures, syncope and weakness.  Hematological: Negative for adenopathy. Bruises/bleeds easily.  Psychiatric/Behavioral: Positive for dysphoric mood. The patient is nervous/anxious.   All other systems reviewed and are negative.   BP 130/82 (BP Location: Left Arm, Patient Position: Sitting, Cuff Size: Normal)   Pulse 74   Resp 18   Ht 5\' 2"  (1.575 m)   Wt 203 lb (92.1 kg)   SpO2 95% Comment: RA  BMI 37.13 kg/m  Physical Exam Vitals signs reviewed.  Constitutional:      General: She is not in acute distress.    Appearance: She is obese.  HENT:     Head: Normocephalic and atraumatic.  Eyes:     General: No scleral icterus.    Extraocular Movements: Extraocular movements intact.  Neck:     Musculoskeletal: Neck supple.  Cardiovascular:     Rate and Rhythm: Normal rate.     Heart sounds: Murmur (2/6 systolic murmur) present.  Pulmonary:     Effort: Pulmonary effort is normal. No respiratory distress.     Breath sounds: No wheezing or rales.     Comments: Diminished BS bilaterally Abdominal:     General: There is no distension.     Tenderness: There is no abdominal tenderness.  Musculoskeletal:        General: No swelling or deformity.  Lymphadenopathy:     Cervical: No cervical adenopathy.  Skin:    General: Skin is warm and dry.     Capillary Refill: Capillary refill takes less than 2 seconds.   Neurological:     General: No focal deficit present.     Mental Status: She is alert and oriented to person, place, and time.     Cranial Nerves: No cranial nerve deficit.     Motor: No weakness.    Diagnostic Tests: NUCLEAR MEDICINE PET SKULL BASE TO THIGH  TECHNIQUE: 11.86 mCi F-18 FDG was injected intravenously. Full-ring PET imaging was performed from the skull base to thigh after the radiotracer. CT data was obtained and used for attenuation correction and anatomic localization.  Fasting blood glucose: 88 mg/dl  COMPARISON:  CT chest 10/24/2018  FINDINGS: Mediastinal blood pool activity: SUV max 2.6  NECK: No hypermetabolic lymph nodes in the neck.  Incidental CT findings: none  CHEST: No hypermetabolic axillary or supraclavicular lymph nodes. No hypermetabolic mediastinal or hilar lymph nodes.  Solid mass within the left upper lobe measures 4.9 cm and has an SUV max 18.35. No additional pulmonary nodules. Subsegmental atelectasis versus scar noted in the right middle lobe, lingula and both lower lobes.  Incidental CT findings: Aortic atherosclerosis. Calcifications within the LAD, left circumflex and RCA coronary arteries noted.  ABDOMEN/PELVIS: No abnormal hypermetabolic activity within the liver, pancreas, adrenal glands, or spleen. No hypermetabolic lymph nodes in the abdomen or pelvis.  Incidental CT findings: Aortic atherosclerosis.  Previous cholecystectomy.  SKELETON: No focal hypermetabolic activity to suggest skeletal metastasis.  Incidental CT findings: none  IMPRESSION: 1. Left upper lobe lung mass is intensely hypermetabolic compatible with primary bronchogenic carcinoma. Assuming a non-small cell histology imaging findings are compatible with T2aN0M0 disease or stage IB disease. 2. Multi vessel coronary artery atherosclerotic disease. 3. Aortic Atherosclerosis (ICD10-I70.0).   Electronically Signed   By: Kerby Moors  M.D.   On: 11/18/2018 08:42 I personally reviewed the PET/CT images and concur with the findings noted above  Impression: Laura Abbott is a 73 year old woman with a history of tobacco abuse, COPD with bronchitis, hypertension, hyperlipidemia, obesity, anxiety and depression.  She has a 4.5 cm mass in the left upper lobe that is markedly hypermetabolic on PET/CT with an SUV of 18.  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  Clinical stage would be 1B(T2, N0, M0).  I discussed potential treatment options including surgical resection versus radiation and chemotherapy.  If she opts for surgical resection she probably would need adjuvant chemotherapy due to the tumor size.  We discussed the relative advantages and disadvantages to those approaches.  I described the proposed operation to Mrs. Padron and her daughter.  I informed them of the general nature of the procedure including the need for general anesthesia, the incisions to be used, the use of a drainage tube postoperatively, the effect on her pulmonary function, the expected hospital stay, and the overall recovery.  I informed them of the indications, risk, benefits, and alternatives.  They understand the risks include, but are not limited to death, MI, DVT, PE, bleeding, possible need for transfusion, infection, prolonged air leak, cardiac arrhythmias, as well as the possibility of other unforeseeable complications.  She does not have any cardiac history.  She does have some coronary calcification on CT.  She does not have any symptoms consistent with angina.  There is no indication for additional cardiac work-up prior to surgery.  From her history she gives an account that she should be able to tolerate lobectomy.  However, she has not yet had pulmonary function testing.  We definitely need to assess that prior to final decision.  Needs testing both with and without bronchodilators and also needs a room air blood gas.  She wishes  to proceed with surgery should her pulmonary function be adequate.  Plan: Pulmonary function testing with and without bronchodilators and with room air ABG Tentatively plan for left VATS, left upper lobectomy on Monday, 12/15/2018  Melrose Nakayama, MD [facility] [phone]

## 2018-12-05 ENCOUNTER — Encounter: Payer: Self-pay | Admitting: *Deleted

## 2018-12-05 ENCOUNTER — Other Ambulatory Visit: Payer: Self-pay | Admitting: *Deleted

## 2018-12-05 DIAGNOSIS — R918 Other nonspecific abnormal finding of lung field: Secondary | ICD-10-CM

## 2018-12-08 DIAGNOSIS — F419 Anxiety disorder, unspecified: Secondary | ICD-10-CM | POA: Diagnosis not present

## 2018-12-08 DIAGNOSIS — J209 Acute bronchitis, unspecified: Secondary | ICD-10-CM | POA: Diagnosis not present

## 2018-12-08 DIAGNOSIS — M179 Osteoarthritis of knee, unspecified: Secondary | ICD-10-CM | POA: Diagnosis not present

## 2018-12-08 DIAGNOSIS — F329 Major depressive disorder, single episode, unspecified: Secondary | ICD-10-CM | POA: Diagnosis not present

## 2018-12-08 DIAGNOSIS — J440 Chronic obstructive pulmonary disease with acute lower respiratory infection: Secondary | ICD-10-CM | POA: Diagnosis not present

## 2018-12-08 DIAGNOSIS — M810 Age-related osteoporosis without current pathological fracture: Secondary | ICD-10-CM | POA: Diagnosis not present

## 2018-12-08 DIAGNOSIS — J441 Chronic obstructive pulmonary disease with (acute) exacerbation: Secondary | ICD-10-CM | POA: Diagnosis not present

## 2018-12-08 DIAGNOSIS — I10 Essential (primary) hypertension: Secondary | ICD-10-CM | POA: Diagnosis not present

## 2018-12-08 DIAGNOSIS — R918 Other nonspecific abnormal finding of lung field: Secondary | ICD-10-CM | POA: Diagnosis not present

## 2018-12-10 ENCOUNTER — Ambulatory Visit (HOSPITAL_COMMUNITY)
Admission: RE | Admit: 2018-12-10 | Discharge: 2018-12-10 | Disposition: A | Discharge status: Home / Self Care | Payer: Medicare HMO | Source: Ambulatory Visit | Attending: Thoracic Surgery (Cardiothoracic Vascular Surgery) | Admitting: Thoracic Surgery (Cardiothoracic Vascular Surgery)

## 2018-12-10 DIAGNOSIS — R918 Other nonspecific abnormal finding of lung field: Secondary | ICD-10-CM | POA: Insufficient documentation

## 2018-12-10 LAB — PULMONARY FUNCTION TEST
DL/VA % pred: 84 %
DL/VA: 3.81 ml/min/mmHg/L
DLCO cor % pred: 55 %
DLCO cor: 12.01 ml/min/mmHg
DLCO unc % pred: 56 %
DLCO unc: 12.12 ml/min/mmHg
FEF 25-75 Post: 1.21 L/sec
FEF 25-75 Pre: 1.19 L/sec
FEF2575-%Change-Post: 1 %
FEF2575-%PRED-PRE: 68 %
FEF2575-%Pred-Post: 69 %
FEV1-%Change-Post: 3 %
FEV1-%Pred-Post: 68 %
FEV1-%Pred-Pre: 65 %
FEV1-PRE: 1.34 L
FEV1-Post: 1.4 L
FEV1FVC-%Change-Post: -10 %
FEV1FVC-%Pred-Pre: 105 %
FEV6-%CHANGE-POST: 16 %
FEV6-%Pred-Post: 75 %
FEV6-%Pred-Pre: 64 %
FEV6-PRE: 1.68 L
FEV6-Post: 1.95 L
FEV6FVC-%Pred-Post: 104 %
FEV6FVC-%Pred-Pre: 104 %
FVC-%Change-Post: 16 %
FVC-%Pred-Post: 71 %
FVC-%Pred-Pre: 61 %
FVC-Post: 1.95 L
FVC-Pre: 1.68 L
Post FEV1/FVC ratio: 72 %
Post FEV6/FVC ratio: 100 %
Pre FEV1/FVC ratio: 80 %
Pre FEV6/FVC Ratio: 100 %
RV % pred: 169 %
RV: 3.58 L
TLC % pred: 112 %
TLC: 5.36 L

## 2018-12-10 LAB — BLOOD GAS, ARTERIAL
Acid-Base Excess: 2.8 mmol/L — ABNORMAL HIGH (ref 0.0–2.0)
Bicarbonate: 27.5 mmol/L (ref 20.0–28.0)
Drawn by: 244901
FIO2: 21
O2 Saturation: 90.8 %
PATIENT TEMPERATURE: 98.6
pCO2 arterial: 44.6 mmHg (ref 32.0–48.0)
pH, Arterial: 7.406 (ref 7.350–7.450)
pO2, Arterial: 60 mmHg — ABNORMAL LOW (ref 83.0–108.0)

## 2018-12-10 MED ORDER — ALBUTEROL SULFATE (2.5 MG/3ML) 0.083% IN NEBU
2.5000 mg | INHALATION_SOLUTION | Freq: Once | RESPIRATORY_TRACT | Status: AC
  Administered 2018-12-10: 2.5 mg via RESPIRATORY_TRACT

## 2018-12-10 NOTE — Pre-Procedure Instructions (Signed)
Laura Abbott  12/10/2018      [facility] - [place], [address] [place] [address] Phone: [phone] Fax: [phone]    Your procedure is scheduled on Mon. Jan. 20  Report to [facility] Admitting at 5:30 A.M.  Call this number if you have problems the morning of surgery:  [phone]   Remember:  Do not eat or drink after midnight.      Take these medicines the morning of surgery with A SIP OF WATER :              Albuterol inhaler if needed--bring to hospital             Fluoxetine (prozac)            flonase nasal spray            breo--bring to hospital            hydrocodone if needed           Metoprolol (lopressor)           Omeprazole (prilosec)                   7 days prior to surgery STOP taking any Aspirin (unless otherwise instructed by your surgeon), Aleve, Naproxen, Ibuprofen, Motrin, Advil, Goody's, BC's, all herbal medications, fish oil, and all vitamins and nabumetone (relafen)    Do not wear jewelry, make-up or nail polish.  Do not wear lotions, powders, or perfumes, or deodorant.  Do not shave 48 hours prior to surgery.  Men may shave face and neck.  Do not bring valuables to the hospital.  [facility] is not responsible for any belongings or valuables.  Contacts, dentures or bridgework may not be worn into surgery.  Leave your suitcase in the car.  After surgery it may be brought to your room.  For patients admitted to the hospital, discharge time will be determined by your treatment team.     Special instructions:   [facility]- Preparing For Surgery  Before surgery, you can play an important role. Because skin is not sterile, your skin needs to be as free of germs as possible. You can reduce the number of germs on your skin by washing with CHG (chlorahexidine gluconate) Soap before surgery.  CHG is an antiseptic cleaner which kills germs and bonds with the skin to continue killing germs  even after washing.    Oral Hygiene is also important to reduce your risk of infection.  Remember - BRUSH YOUR TEETH THE MORNING OF SURGERY WITH YOUR REGULAR TOOTHPASTE  Please do not use if you have an allergy to CHG or antibacterial soaps. If your skin becomes reddened/irritated stop using the CHG.  Do not shave (including legs and underarms) for at least 48 hours prior to first CHG shower. It is OK to shave your face.  Please follow these instructions carefully.   1. Shower the NIGHT BEFORE SURGERY and the MORNING OF SURGERY with CHG.   2. If you chose to wash your hair, wash your hair first as usual with your normal shampoo.  3. After you shampoo, rinse your hair and body thoroughly to remove the shampoo.  4. Use CHG as you would any other liquid soap. You can apply CHG directly to the skin and wash gently with a scrungie or a clean washcloth.   5. Apply the CHG Soap to  your body ONLY FROM THE NECK DOWN.  Do not use on open wounds or open sores. Avoid contact with your eyes, ears, mouth and genitals (private parts). Wash Face and genitals (private parts)  with your normal soap.  6. Wash thoroughly, paying special attention to the area where your surgery will be performed.  7. Thoroughly rinse your body with warm water from the neck down.  8. DO NOT shower/wash with your normal soap after using and rinsing off the CHG Soap.  9. Pat yourself dry with a CLEAN TOWEL.  10. Wear CLEAN PAJAMAS to bed the night before surgery, wear comfortable clothes the morning of surgery  11. Place CLEAN SHEETS on your bed the night of your first shower and DO NOT SLEEP WITH PETS.    Day of Surgery:  Do not apply any deodorants/lotions.  Please wear clean clothes to the hospital/surgery center.   Remember to brush your teeth WITH YOUR REGULAR TOOTHPASTE.    Please read over the following fact sheets that you were given. Coughing and Deep Breathing, MRSA Information and Surgical Site Infection  Prevention

## 2018-12-11 ENCOUNTER — Other Ambulatory Visit: Payer: Self-pay

## 2018-12-11 ENCOUNTER — Encounter (HOSPITAL_COMMUNITY): Payer: Self-pay

## 2018-12-11 ENCOUNTER — Encounter (HOSPITAL_COMMUNITY)
Admission: RE | Admit: 2018-12-11 | Discharge: 2018-12-11 | Disposition: A | Discharge status: Home / Self Care | Payer: Medicare HMO | Source: Ambulatory Visit | Attending: Thoracic Surgery (Cardiothoracic Vascular Surgery) | Admitting: Thoracic Surgery (Cardiothoracic Vascular Surgery)

## 2018-12-11 ENCOUNTER — Ambulatory Visit (HOSPITAL_COMMUNITY)
Admission: RE | Admit: 2018-12-11 | Discharge: 2018-12-11 | Disposition: A | Discharge status: Home / Self Care | Payer: Medicare HMO | Source: Ambulatory Visit | Attending: Thoracic Surgery (Cardiothoracic Vascular Surgery) | Admitting: Thoracic Surgery (Cardiothoracic Vascular Surgery)

## 2018-12-11 DIAGNOSIS — Z01818 Encounter for other preprocedural examination: Secondary | ICD-10-CM | POA: Insufficient documentation

## 2018-12-11 DIAGNOSIS — I700 Atherosclerosis of aorta: Secondary | ICD-10-CM | POA: Insufficient documentation

## 2018-12-11 DIAGNOSIS — R918 Other nonspecific abnormal finding of lung field: Secondary | ICD-10-CM | POA: Insufficient documentation

## 2018-12-11 DIAGNOSIS — R05 Cough: Secondary | ICD-10-CM | POA: Diagnosis not present

## 2018-12-11 HISTORY — DX: Pneumonia, unspecified organism: J18.9

## 2018-12-11 HISTORY — DX: Personal history of urinary calculi: Z87.442

## 2018-12-11 LAB — URINALYSIS, ROUTINE W REFLEX MICROSCOPIC
Bilirubin Urine: NEGATIVE
Glucose, UA: NEGATIVE mg/dL
Hgb urine dipstick: NEGATIVE
KETONES UR: NEGATIVE mg/dL
Leukocytes, UA: NEGATIVE
NITRITE: NEGATIVE
Protein, ur: NEGATIVE mg/dL
Specific Gravity, Urine: 1.019 (ref 1.005–1.030)
pH: 5 (ref 5.0–8.0)

## 2018-12-11 LAB — APTT: aPTT: 32 seconds (ref 24–36)

## 2018-12-11 LAB — TYPE AND SCREEN
ABO/RH(D): O POS
Antibody Screen: NEGATIVE

## 2018-12-11 LAB — SURGICAL PCR SCREEN
MRSA, PCR: NEGATIVE
STAPHYLOCOCCUS AUREUS: NEGATIVE

## 2018-12-11 LAB — ABO/RH: ABO/RH(D): O POS

## 2018-12-11 LAB — PROTIME-INR
INR: 0.98
Prothrombin Time: 12.9 seconds (ref 11.4–15.2)

## 2018-12-11 NOTE — Progress Notes (Signed)
PCP -  Dr. Woody Seller @ [facility] Internal Medicine Cardiologist - na  Chest x-ray - today EKG - today Stress Test - na ECHO - na Cardiac Cath - na  Sleep Study - na CPAP -   Fasting Blood Sugar - na Checks Blood Sugar _____ times a day  Blood Thinner Instructions: Aspirin Instructions:  Anesthesia review:   Patient denies shortness of breath, fever, cough and chest pain at PAT appointment   Patient verbalized understanding of instructions that were given to them at the PAT appointment. Patient was also instructed that they will need to review over the PAT instructions again at home before surgery.

## 2018-12-11 NOTE — Progress Notes (Signed)
   12/11/18 0823  OBSTRUCTIVE SLEEP APNEA  Have you ever been diagnosed with sleep apnea through a sleep study? No  Do you snore loudly (loud enough to be heard through closed doors)?  1  Do you often feel tired, fatigued, or sleepy during the daytime (such as falling asleep during driving or talking to someone)? 0  Has anyone observed you stop breathing during your sleep? 0  Do you have, or are you being treated for high blood pressure? 1  BMI more than 35 kg/m2? 1  Age > 50 (1-yes) 1  Neck circumference greater than:Female 16 inches or larger, Female 17inches or larger? 1  Female Gender (Yes=1) 0  Obstructive Sleep Apnea Score 5  Score 5 or greater  Results sent to PCP

## 2018-12-15 ENCOUNTER — Encounter (HOSPITAL_COMMUNITY)
Admission: RE | Disposition: A | Discharge status: Home Health | Payer: Self-pay | Source: Home / Self Care | Attending: Thoracic Surgery (Cardiothoracic Vascular Surgery)

## 2018-12-15 ENCOUNTER — Inpatient Hospital Stay (HOSPITAL_COMMUNITY): Payer: Medicare HMO | Admitting: Physician Assistant

## 2018-12-15 ENCOUNTER — Inpatient Hospital Stay (HOSPITAL_COMMUNITY)
Admission: RE | Admit: 2018-12-15 | Discharge: 2018-12-20 | DRG: 164 | Disposition: A | Discharge status: Home Health | Payer: Medicare HMO | Attending: Thoracic Surgery (Cardiothoracic Vascular Surgery) | Admitting: Thoracic Surgery (Cardiothoracic Vascular Surgery)

## 2018-12-15 ENCOUNTER — Inpatient Hospital Stay (HOSPITAL_COMMUNITY): Payer: Medicare HMO

## 2018-12-15 DIAGNOSIS — I700 Atherosclerosis of aorta: Secondary | ICD-10-CM | POA: Diagnosis present

## 2018-12-15 DIAGNOSIS — J441 Chronic obstructive pulmonary disease with (acute) exacerbation: Secondary | ICD-10-CM | POA: Diagnosis not present

## 2018-12-15 DIAGNOSIS — J449 Chronic obstructive pulmonary disease, unspecified: Secondary | ICD-10-CM | POA: Diagnosis present

## 2018-12-15 DIAGNOSIS — D62 Acute posthemorrhagic anemia: Secondary | ICD-10-CM | POA: Diagnosis not present

## 2018-12-15 DIAGNOSIS — R918 Other nonspecific abnormal finding of lung field: Secondary | ICD-10-CM

## 2018-12-15 DIAGNOSIS — K219 Gastro-esophageal reflux disease without esophagitis: Secondary | ICD-10-CM | POA: Diagnosis present

## 2018-12-15 DIAGNOSIS — Z8249 Family history of ischemic heart disease and other diseases of the circulatory system: Secondary | ICD-10-CM

## 2018-12-15 DIAGNOSIS — D72829 Elevated white blood cell count, unspecified: Secondary | ICD-10-CM | POA: Diagnosis not present

## 2018-12-15 DIAGNOSIS — Z801 Family history of malignant neoplasm of trachea, bronchus and lung: Secondary | ICD-10-CM | POA: Diagnosis not present

## 2018-12-15 DIAGNOSIS — F329 Major depressive disorder, single episode, unspecified: Secondary | ICD-10-CM | POA: Diagnosis present

## 2018-12-15 DIAGNOSIS — Z803 Family history of malignant neoplasm of breast: Secondary | ICD-10-CM | POA: Diagnosis not present

## 2018-12-15 DIAGNOSIS — Z902 Acquired absence of lung [part of]: Secondary | ICD-10-CM

## 2018-12-15 DIAGNOSIS — Z09 Encounter for follow-up examination after completed treatment for conditions other than malignant neoplasm: Secondary | ICD-10-CM

## 2018-12-15 DIAGNOSIS — E876 Hypokalemia: Secondary | ICD-10-CM | POA: Diagnosis not present

## 2018-12-15 DIAGNOSIS — E669 Obesity, unspecified: Secondary | ICD-10-CM | POA: Diagnosis present

## 2018-12-15 DIAGNOSIS — Z4682 Encounter for fitting and adjustment of non-vascular catheter: Secondary | ICD-10-CM | POA: Diagnosis not present

## 2018-12-15 DIAGNOSIS — Z806 Family history of leukemia: Secondary | ICD-10-CM | POA: Diagnosis not present

## 2018-12-15 DIAGNOSIS — Z79899 Other long term (current) drug therapy: Secondary | ICD-10-CM

## 2018-12-15 DIAGNOSIS — M810 Age-related osteoporosis without current pathological fracture: Secondary | ICD-10-CM | POA: Diagnosis present

## 2018-12-15 DIAGNOSIS — Z8051 Family history of malignant neoplasm of kidney: Secondary | ICD-10-CM | POA: Diagnosis not present

## 2018-12-15 DIAGNOSIS — E785 Hyperlipidemia, unspecified: Secondary | ICD-10-CM | POA: Diagnosis present

## 2018-12-15 DIAGNOSIS — Z87891 Personal history of nicotine dependence: Secondary | ICD-10-CM

## 2018-12-15 DIAGNOSIS — C3490 Malignant neoplasm of unspecified part of unspecified bronchus or lung: Secondary | ICD-10-CM | POA: Diagnosis present

## 2018-12-15 DIAGNOSIS — C3412 Malignant neoplasm of upper lobe, left bronchus or lung: Secondary | ICD-10-CM | POA: Diagnosis not present

## 2018-12-15 DIAGNOSIS — R0902 Hypoxemia: Secondary | ICD-10-CM | POA: Diagnosis not present

## 2018-12-15 DIAGNOSIS — Z6837 Body mass index (BMI) 37.0-37.9, adult: Secondary | ICD-10-CM | POA: Diagnosis not present

## 2018-12-15 DIAGNOSIS — I10 Essential (primary) hypertension: Secondary | ICD-10-CM | POA: Diagnosis not present

## 2018-12-15 DIAGNOSIS — Z7951 Long term (current) use of inhaled steroids: Secondary | ICD-10-CM

## 2018-12-15 DIAGNOSIS — J9383 Other pneumothorax: Secondary | ICD-10-CM | POA: Diagnosis not present

## 2018-12-15 DIAGNOSIS — Z800 Family history of malignant neoplasm of digestive organs: Secondary | ICD-10-CM

## 2018-12-15 DIAGNOSIS — I2510 Atherosclerotic heart disease of native coronary artery without angina pectoris: Secondary | ICD-10-CM | POA: Diagnosis present

## 2018-12-15 DIAGNOSIS — J90 Pleural effusion, not elsewhere classified: Secondary | ICD-10-CM | POA: Diagnosis not present

## 2018-12-15 DIAGNOSIS — J9811 Atelectasis: Secondary | ICD-10-CM | POA: Diagnosis not present

## 2018-12-15 DIAGNOSIS — F419 Anxiety disorder, unspecified: Secondary | ICD-10-CM | POA: Diagnosis present

## 2018-12-15 DIAGNOSIS — R222 Localized swelling, mass and lump, trunk: Secondary | ICD-10-CM | POA: Diagnosis not present

## 2018-12-15 DIAGNOSIS — Z853 Personal history of malignant neoplasm of breast: Secondary | ICD-10-CM

## 2018-12-15 HISTORY — PX: VIDEO ASSISTED THORACOSCOPY (VATS)/ LOBECTOMY: SHX6169

## 2018-12-15 HISTORY — PX: NODE DISSECTION: SHX5269

## 2018-12-15 LAB — GLUCOSE, CAPILLARY
Glucose-Capillary: 130 mg/dL — ABNORMAL HIGH (ref 70–99)
Glucose-Capillary: 123 mg/dL — ABNORMAL HIGH (ref 70–99)
Glucose-Capillary: 124 mg/dL — ABNORMAL HIGH (ref 70–99)

## 2018-12-15 LAB — POCT I-STAT 7, (LYTES, BLD GAS, ICA,H+H)
Acid-Base Excess: 2 mmol/L (ref 0.0–2.0)
Bicarbonate: 30.5 mmol/L — ABNORMAL HIGH (ref 20.0–28.0)
Calcium, Ion: 1.17 mmol/L (ref 1.15–1.40)
HCT: 36 % (ref 36.0–46.0)
HEMOGLOBIN: 12.2 g/dL (ref 12.0–15.0)
O2 Saturation: 95 %
Potassium: 4.4 mmol/L (ref 3.5–5.1)
Sodium: 140 mmol/L (ref 135–145)
TCO2: 33 mmol/L — ABNORMAL HIGH (ref 22–32)
pCO2 arterial: 69.4 mmHg (ref 32.0–48.0)
pH, Arterial: 7.251 — ABNORMAL LOW (ref 7.350–7.450)
pO2, Arterial: 89 mmHg (ref 83.0–108.0)

## 2018-12-15 SURGERY — VIDEO ASSISTED THORACOSCOPY (VATS)/ LOBECTOMY
Anesthesia: General | Site: Chest | Laterality: Left

## 2018-12-15 MED ORDER — LIDOCAINE 2% (20 MG/ML) 5 ML SYRINGE
INTRAMUSCULAR | Status: AC
  Filled 2018-12-15: qty 5

## 2018-12-15 MED ORDER — PROPOFOL 10 MG/ML IV BOLUS
INTRAVENOUS | Status: DC | PRN
  Administered 2018-12-15: 130 mg via INTRAVENOUS
  Administered 2018-12-15: 20 mg via INTRAVENOUS
  Administered 2018-12-15: 30 mg via INTRAVENOUS

## 2018-12-15 MED ORDER — FLUTICASONE FUROATE-VILANTEROL 100-25 MCG/INH IN AEPB
1.0000 | INHALATION_SPRAY | Freq: Every day | RESPIRATORY_TRACT | Status: DC
  Administered 2018-12-16 – 2018-12-20 (×5): 1 via RESPIRATORY_TRACT
  Filled 2018-12-15: qty 28

## 2018-12-15 MED ORDER — DIPHENHYDRAMINE HCL 12.5 MG/5ML PO ELIX
12.5000 mg | ORAL_SOLUTION | Freq: Four times a day (QID) | ORAL | Status: DC | PRN
  Filled 2018-12-15: qty 5

## 2018-12-15 MED ORDER — CALCIUM 600-200 MG-UNIT PO TABS: 1.0000 | ORAL_TABLET | Freq: Every day | ORAL | Status: DC

## 2018-12-15 MED ORDER — ALBUMIN HUMAN 5 % IV SOLN
INTRAVENOUS | Status: DC | PRN
  Administered 2018-12-15 (×2): via INTRAVENOUS

## 2018-12-15 MED ORDER — BUPIVACAINE LIPOSOME 1.3 % IJ SUSP
20.0000 mL | INTRAMUSCULAR | Status: DC
  Filled 2018-12-15: qty 20

## 2018-12-15 MED ORDER — LISINOPRIL 10 MG PO TABS
10.0000 mg | ORAL_TABLET | Freq: Every day | ORAL | Status: DC
  Administered 2018-12-16 – 2018-12-20 (×5): 10 mg via ORAL
  Filled 2018-12-15 (×5): qty 1

## 2018-12-15 MED ORDER — ACETAMINOPHEN 160 MG/5ML PO SOLN
1000.0000 mg | Freq: Four times a day (QID) | ORAL | Status: AC
  Administered 2018-12-15: 1000 mg via ORAL
  Filled 2018-12-15 (×3): qty 40.6

## 2018-12-15 MED ORDER — HYDROCODONE-ACETAMINOPHEN 7.5-325 MG PO TABS: 1.0000 | ORAL_TABLET | Freq: Once | ORAL | Status: DC | PRN

## 2018-12-15 MED ORDER — FLUOXETINE HCL 20 MG PO CAPS
40.0000 mg | ORAL_CAPSULE | Freq: Every day | ORAL | Status: DC
  Administered 2018-12-16 – 2018-12-20 (×5): 40 mg via ORAL
  Filled 2018-12-15 (×6): qty 2

## 2018-12-15 MED ORDER — EPHEDRINE SULFATE-NACL 50-0.9 MG/10ML-% IV SOSY
PREFILLED_SYRINGE | INTRAVENOUS | Status: DC | PRN
  Administered 2018-12-15 (×3): 10 mg via INTRAVENOUS

## 2018-12-15 MED ORDER — BUPIVACAINE HCL (PF) 0.5 % IJ SOLN: INTRAMUSCULAR | Status: DC | PRN

## 2018-12-15 MED ORDER — ACETAMINOPHEN 10 MG/ML IV SOLN: 1000.0000 mg | Freq: Once | INTRAVENOUS | Status: DC | PRN

## 2018-12-15 MED ORDER — ONDANSETRON HCL 4 MG/2ML IJ SOLN: 4.0000 mg | Freq: Four times a day (QID) | INTRAMUSCULAR | Status: DC | PRN

## 2018-12-15 MED ORDER — ENOXAPARIN SODIUM 40 MG/0.4ML ~~LOC~~ SOLN
40.0000 mg | SUBCUTANEOUS | Status: DC
  Administered 2018-12-15 – 2018-12-19 (×4): 40 mg via SUBCUTANEOUS
  Filled 2018-12-15 (×4): qty 0.4

## 2018-12-15 MED ORDER — FENTANYL CITRATE (PF) 250 MCG/5ML IJ SOLN
INTRAMUSCULAR | Status: AC
  Filled 2018-12-15: qty 5

## 2018-12-15 MED ORDER — ALBUTEROL SULFATE (2.5 MG/3ML) 0.083% IN NEBU
2.5000 mg | INHALATION_SOLUTION | RESPIRATORY_TRACT | Status: DC
  Administered 2018-12-15 – 2018-12-16 (×4): 2.5 mg via RESPIRATORY_TRACT
  Filled 2018-12-15 (×4): qty 3

## 2018-12-15 MED ORDER — PANTOPRAZOLE SODIUM 40 MG PO TBEC
40.0000 mg | DELAYED_RELEASE_TABLET | Freq: Every day | ORAL | Status: DC
  Administered 2018-12-15 – 2018-12-20 (×6): 40 mg via ORAL
  Filled 2018-12-15 (×6): qty 1

## 2018-12-15 MED ORDER — MIDAZOLAM HCL 2 MG/2ML IJ SOLN
INTRAMUSCULAR | Status: AC
  Filled 2018-12-15: qty 2

## 2018-12-15 MED ORDER — ONDANSETRON HCL 4 MG/2ML IJ SOLN
INTRAMUSCULAR | Status: DC | PRN
  Administered 2018-12-15: 4 mg via INTRAVENOUS

## 2018-12-15 MED ORDER — OXYCODONE HCL 5 MG PO TABS
5.0000 mg | ORAL_TABLET | ORAL | Status: DC | PRN
  Administered 2018-12-15 – 2018-12-16 (×3): 5 mg via ORAL
  Administered 2018-12-16: 10 mg via ORAL
  Administered 2018-12-17: 5 mg via ORAL
  Administered 2018-12-17: 10 mg via ORAL
  Administered 2018-12-17: 5 mg via ORAL
  Administered 2018-12-17: 10 mg via ORAL
  Administered 2018-12-18: 5 mg via ORAL
  Administered 2018-12-18: 7 mg via ORAL
  Administered 2018-12-18 – 2018-12-19 (×3): 5 mg via ORAL
  Filled 2018-12-15 (×2): qty 1
  Filled 2018-12-15 (×2): qty 2
  Filled 2018-12-15: qty 1
  Filled 2018-12-15: qty 2
  Filled 2018-12-15 (×3): qty 1
  Filled 2018-12-15: qty 2
  Filled 2018-12-15 (×2): qty 1
  Filled 2018-12-15 (×2): qty 2

## 2018-12-15 MED ORDER — MEPERIDINE HCL 50 MG/ML IJ SOLN: 6.2500 mg | INTRAMUSCULAR | Status: DC | PRN

## 2018-12-15 MED ORDER — FLUTICASONE PROPIONATE 50 MCG/ACT NA SUSP
2.0000 | Freq: Every day | NASAL | Status: DC
  Administered 2018-12-16 – 2018-12-20 (×5): 2 via NASAL
  Filled 2018-12-15: qty 16

## 2018-12-15 MED ORDER — SODIUM CHLORIDE 0.9 % IV SOLN
INTRAVENOUS | Status: DC
  Administered 2018-12-15 – 2018-12-16 (×2): via INTRAVENOUS

## 2018-12-15 MED ORDER — ONDANSETRON HCL 4 MG/2ML IJ SOLN
INTRAMUSCULAR | Status: AC
  Filled 2018-12-15: qty 2

## 2018-12-15 MED ORDER — DEXAMETHASONE SODIUM PHOSPHATE 10 MG/ML IJ SOLN
INTRAMUSCULAR | Status: DC | PRN
  Administered 2018-12-15: 10 mg via INTRAVENOUS

## 2018-12-15 MED ORDER — POTASSIUM CHLORIDE 10 MEQ/50ML IV SOLN: 10.0000 meq | Freq: Every day | INTRAVENOUS | Status: DC | PRN

## 2018-12-15 MED ORDER — SENNOSIDES-DOCUSATE SODIUM 8.6-50 MG PO TABS
1.0000 | ORAL_TABLET | Freq: Every day | ORAL | Status: DC
  Administered 2018-12-15 – 2018-12-18 (×4): 1 via ORAL
  Filled 2018-12-15 (×5): qty 1

## 2018-12-15 MED ORDER — LACTATED RINGERS IV SOLN
INTRAVENOUS | Status: DC | PRN
  Administered 2018-12-15: 08:00:00 via INTRAVENOUS

## 2018-12-15 MED ORDER — PROPOFOL 10 MG/ML IV BOLUS
INTRAVENOUS | Status: AC
  Filled 2018-12-15: qty 40

## 2018-12-15 MED ORDER — DIPHENHYDRAMINE HCL 50 MG/ML IJ SOLN: 12.5000 mg | Freq: Four times a day (QID) | INTRAMUSCULAR | Status: DC | PRN

## 2018-12-15 MED ORDER — LIDOCAINE 2% (20 MG/ML) 5 ML SYRINGE
INTRAMUSCULAR | Status: DC | PRN
  Administered 2018-12-15: 100 mg via INTRAVENOUS

## 2018-12-15 MED ORDER — METOPROLOL TARTRATE 50 MG PO TABS: 50.0000 mg | ORAL_TABLET | Freq: Two times a day (BID) | ORAL | Status: DC

## 2018-12-15 MED ORDER — FENTANYL CITRATE (PF) 250 MCG/5ML IJ SOLN
INTRAMUSCULAR | Status: DC | PRN
  Administered 2018-12-15 (×3): 50 ug via INTRAVENOUS
  Administered 2018-12-15: 100 ug via INTRAVENOUS

## 2018-12-15 MED ORDER — BUPIVACAINE LIPOSOME 1.3 % IJ SUSP
INTRAMUSCULAR | Status: DC | PRN
  Administered 2018-12-15: 09:00:00

## 2018-12-15 MED ORDER — INSULIN ASPART 100 UNIT/ML ~~LOC~~ SOLN
0.0000 [IU] | Freq: Three times a day (TID) | SUBCUTANEOUS | Status: DC
  Administered 2018-12-15 (×2): 2 [IU] via SUBCUTANEOUS

## 2018-12-15 MED ORDER — HEMOSTATIC AGENTS (NO CHARGE) OPTIME
TOPICAL | Status: DC | PRN
  Administered 2018-12-15: 1 via TOPICAL

## 2018-12-15 MED ORDER — CALCIUM CARBONATE-VITAMIN D 500-200 MG-UNIT PO TABS
1.0000 | ORAL_TABLET | Freq: Every day | ORAL | Status: DC
  Administered 2018-12-16 – 2018-12-20 (×5): 1 via ORAL
  Filled 2018-12-15 (×5): qty 1

## 2018-12-15 MED ORDER — TRAZODONE HCL 50 MG PO TABS
50.0000 mg | ORAL_TABLET | Freq: Every day | ORAL | Status: DC
  Administered 2018-12-15 – 2018-12-19 (×5): 50 mg via ORAL
  Filled 2018-12-15 (×5): qty 1

## 2018-12-15 MED ORDER — SUGAMMADEX SODIUM 200 MG/2ML IV SOLN
INTRAVENOUS | Status: DC | PRN
  Administered 2018-12-15: 200 mg via INTRAVENOUS

## 2018-12-15 MED ORDER — CEFAZOLIN SODIUM-DEXTROSE 2-4 GM/100ML-% IV SOLN
2.0000 g | INTRAVENOUS | Status: AC
  Administered 2018-12-15: 2 g via INTRAVENOUS
  Filled 2018-12-15: qty 100

## 2018-12-15 MED ORDER — SODIUM CHLORIDE 0.9 % IV SOLN
INTRAVENOUS | Status: DC | PRN
  Administered 2018-12-15: 20 ug/min via INTRAVENOUS

## 2018-12-15 MED ORDER — BUPIVACAINE HCL (PF) 0.5 % IJ SOLN
INTRAMUSCULAR | Status: AC
  Filled 2018-12-15: qty 30

## 2018-12-15 MED ORDER — MIDAZOLAM HCL 2 MG/2ML IJ SOLN
INTRAMUSCULAR | Status: DC | PRN
  Administered 2018-12-15: 1 mg via INTRAVENOUS

## 2018-12-15 MED ORDER — SODIUM CHLORIDE (PF) 0.9 % IJ SOLN
INTRAMUSCULAR | Status: DC | PRN
  Administered 2018-12-15: 50 mL via INTRAVENOUS

## 2018-12-15 MED ORDER — BISACODYL 5 MG PO TBEC
10.0000 mg | DELAYED_RELEASE_TABLET | Freq: Every day | ORAL | Status: DC
  Administered 2018-12-15 – 2018-12-19 (×5): 10 mg via ORAL
  Filled 2018-12-15 (×7): qty 2

## 2018-12-15 MED ORDER — PROMETHAZINE HCL 25 MG/ML IJ SOLN: 6.2500 mg | INTRAMUSCULAR | Status: DC | PRN

## 2018-12-15 MED ORDER — ACETAMINOPHEN 500 MG PO TABS
1000.0000 mg | ORAL_TABLET | Freq: Four times a day (QID) | ORAL | Status: AC
  Administered 2018-12-15 – 2018-12-20 (×14): 1000 mg via ORAL
  Filled 2018-12-15 (×15): qty 2

## 2018-12-15 MED ORDER — SODIUM CHLORIDE 0.9% FLUSH: 9.0000 mL | INTRAVENOUS | Status: DC | PRN

## 2018-12-15 MED ORDER — ROCURONIUM BROMIDE 50 MG/5ML IV SOSY
PREFILLED_SYRINGE | INTRAVENOUS | Status: AC
  Filled 2018-12-15: qty 10

## 2018-12-15 MED ORDER — FENTANYL 40 MCG/ML IV SOLN
INTRAVENOUS | Status: DC
  Administered 2018-12-15: 1000 ug via INTRAVENOUS
  Administered 2018-12-15: 60 ug via INTRAVENOUS
  Administered 2018-12-15: 105 ug via INTRAVENOUS
  Administered 2018-12-16: 30 ug via INTRAVENOUS
  Administered 2018-12-16: 60 ug via INTRAVENOUS
  Administered 2018-12-16: 30 ug via INTRAVENOUS
  Administered 2018-12-16: 75 ug via INTRAVENOUS
  Administered 2018-12-16: 1000 ug via INTRAVENOUS
  Administered 2018-12-17: 60 ug via INTRAVENOUS
  Administered 2018-12-17: 15 ug via INTRAVENOUS
  Filled 2018-12-15 (×2): qty 1000

## 2018-12-15 MED ORDER — NALOXONE HCL 0.4 MG/ML IJ SOLN: 0.4000 mg | INTRAMUSCULAR | Status: DC | PRN

## 2018-12-15 MED ORDER — DEXAMETHASONE SODIUM PHOSPHATE 10 MG/ML IJ SOLN
INTRAMUSCULAR | Status: AC
  Filled 2018-12-15: qty 1

## 2018-12-15 MED ORDER — CLONAZEPAM 0.5 MG PO TABS
0.5000 mg | ORAL_TABLET | Freq: Every evening | ORAL | Status: DC | PRN
  Administered 2018-12-16: 0.5 mg via ORAL
  Filled 2018-12-15: qty 1

## 2018-12-15 MED ORDER — ALBUTEROL SULFATE (2.5 MG/3ML) 0.083% IN NEBU: 2.5000 mg | INHALATION_SOLUTION | RESPIRATORY_TRACT | Status: DC

## 2018-12-15 MED ORDER — ROCURONIUM BROMIDE 10 MG/ML (PF) SYRINGE
PREFILLED_SYRINGE | INTRAVENOUS | Status: DC | PRN
  Administered 2018-12-15 (×2): 30 mg via INTRAVENOUS
  Administered 2018-12-15: 20 mg via INTRAVENOUS
  Administered 2018-12-15: 50 mg via INTRAVENOUS
  Administered 2018-12-15: 20 mg via INTRAVENOUS
  Administered 2018-12-15: 50 mg via INTRAVENOUS

## 2018-12-15 MED ORDER — CEFAZOLIN SODIUM-DEXTROSE 2-4 GM/100ML-% IV SOLN
2.0000 g | Freq: Three times a day (TID) | INTRAVENOUS | Status: AC
  Administered 2018-12-15 (×2): 2 g via INTRAVENOUS
  Filled 2018-12-15 (×3): qty 100

## 2018-12-15 MED ORDER — HYDROMORPHONE HCL 1 MG/ML IJ SOLN: 0.2500 mg | INTRAMUSCULAR | Status: DC | PRN

## 2018-12-15 MED ORDER — 0.9 % SODIUM CHLORIDE (POUR BTL) OPTIME
TOPICAL | Status: DC | PRN
  Administered 2018-12-15: 1000 mL

## 2018-12-15 SURGICAL SUPPLY — 101 items
APPLICATOR COTTON TIP 6 STRL (MISCELLANEOUS) IMPLANT
APPLICATOR COTTON TIP 6IN STRL (MISCELLANEOUS) ×2
APPLIER CLIP 5 13 M/L LIGAMAX5 (MISCELLANEOUS) ×2
APPLIER CLIP ROT 10 11.4 M/L (STAPLE)
CANISTER SUCT 3000ML PPV (MISCELLANEOUS) ×2 IMPLANT
CATH THORACIC 28FR (CATHETERS) ×1 IMPLANT
CATH THORACIC 28FR RT ANG (CATHETERS) IMPLANT
CATH THORACIC 36FR (CATHETERS) IMPLANT
CATH THORACIC 36FR RT ANG (CATHETERS) IMPLANT
CLIP APPLIE 5 13 M/L LIGAMAX5 (MISCELLANEOUS) IMPLANT
CLIP APPLIE ROT 10 11.4 M/L (STAPLE) IMPLANT
CLIP VESOCCLUDE MED 6/CT (CLIP) ×2 IMPLANT
CONN ST 1/4X3/8  BEN (MISCELLANEOUS)
CONN ST 1/4X3/8 BEN (MISCELLANEOUS) IMPLANT
CONN Y 3/8X3/8X3/8  BEN (MISCELLANEOUS)
CONN Y 3/8X3/8X3/8 BEN (MISCELLANEOUS) IMPLANT
CONT SPEC 4OZ CLIKSEAL STRL BL (MISCELLANEOUS) ×8 IMPLANT
COVER SURGICAL LIGHT HANDLE (MISCELLANEOUS) IMPLANT
COVER WAND RF STERILE (DRAPES) ×2 IMPLANT
DERMABOND ADVANCED (GAUZE/BANDAGES/DRESSINGS) ×1
DERMABOND ADVANCED .7 DNX12 (GAUZE/BANDAGES/DRESSINGS) IMPLANT
DRAIN CHANNEL 28F RND 3/8 FF (WOUND CARE) IMPLANT
DRAIN CHANNEL 32F RND 10.7 FF (WOUND CARE) IMPLANT
DRAPE CV SPLIT W-CLR ANES SCRN (DRAPES) ×2 IMPLANT
DRAPE ORTHO SPLIT 77X108 STRL (DRAPES) ×1
DRAPE SURG ORHT 6 SPLT 77X108 (DRAPES) ×1 IMPLANT
DRAPE WARM FLUID 44X44 (DRAPE) ×2 IMPLANT
ELECT BLADE 6.5 EXT (BLADE) ×2 IMPLANT
ELECT REM PT RETURN 9FT ADLT (ELECTROSURGICAL) ×2
ELECTRODE REM PT RTRN 9FT ADLT (ELECTROSURGICAL) ×1 IMPLANT
GAUZE SPONGE 4X4 12PLY STRL (GAUZE/BANDAGES/DRESSINGS) ×2 IMPLANT
GLOVE BIOGEL PI IND STRL 6.5 (GLOVE) IMPLANT
GLOVE BIOGEL PI INDICATOR 6.5 (GLOVE) ×3
GLOVE SURG SIGNA 7.5 PF LTX (GLOVE) ×4 IMPLANT
GLOVE SURG SS PI 7.5 STRL IVOR (GLOVE) ×1 IMPLANT
GOWN STRL REUS W/ TWL LRG LVL3 (GOWN DISPOSABLE) ×2 IMPLANT
GOWN STRL REUS W/ TWL XL LVL3 (GOWN DISPOSABLE) ×1 IMPLANT
GOWN STRL REUS W/TWL LRG LVL3 (GOWN DISPOSABLE) ×3
GOWN STRL REUS W/TWL XL LVL3 (GOWN DISPOSABLE) ×1
HEMOSTAT SURGICEL 2X14 (HEMOSTASIS) IMPLANT
KIT BASIN OR (CUSTOM PROCEDURE TRAY) ×2 IMPLANT
KIT SUCTION CATH 14FR (SUCTIONS) IMPLANT
KIT TURNOVER KIT B (KITS) ×2 IMPLANT
NDL HYPO 25GX1X1/2 BEV (NEEDLE) ×1 IMPLANT
NDL SPNL 18GX3.5 QUINCKE PK (NEEDLE) ×1 IMPLANT
NEEDLE HYPO 25GX1X1/2 BEV (NEEDLE) ×2 IMPLANT
NEEDLE SPNL 18GX3.5 QUINCKE PK (NEEDLE) ×2 IMPLANT
NS IRRIG 1000ML POUR BTL (IV SOLUTION) ×6 IMPLANT
PACK CHEST (CUSTOM PROCEDURE TRAY) ×2 IMPLANT
PAD ARMBOARD 7.5X6 YLW CONV (MISCELLANEOUS) ×4 IMPLANT
POUCH ENDO CATCH II 15MM (MISCELLANEOUS) ×1 IMPLANT
POUCH SPECIMEN RETRIEVAL 10MM (ENDOMECHANICALS) IMPLANT
PROGEL SPRAY TIP 11IN (MISCELLANEOUS) ×2
RELOAD STAPLE 35X2.5 WHT THIN (STAPLE) IMPLANT
RELOAD STAPLE 45 GOLD REG/THCK (STAPLE) IMPLANT
RELOAD STAPLE 60 3.8 GOLD REG (STAPLE) IMPLANT
RELOAD STAPLE 60 4.1 GRN THCK (STAPLE) IMPLANT
RELOAD STAPLER GOLD 60MM (STAPLE) ×11 IMPLANT
RELOAD STAPLER GREEN 60MM (STAPLE) ×1 IMPLANT
SCISSORS ENDO CVD 5DCS (MISCELLANEOUS) IMPLANT
SEALANT PROGEL (MISCELLANEOUS) ×1 IMPLANT
SEALANT SURG COSEAL 4ML (VASCULAR PRODUCTS) IMPLANT
SEALANT SURG COSEAL 8ML (VASCULAR PRODUCTS) IMPLANT
SHEARS HARMONIC HDI 20CM (ELECTROSURGICAL) ×1 IMPLANT
SOLUTION ANTI FOG 6CC (MISCELLANEOUS) ×2 IMPLANT
SPECIMEN JAR MEDIUM (MISCELLANEOUS) IMPLANT
SPONGE INTESTINAL PEANUT (DISPOSABLE) ×4 IMPLANT
SPONGE TONSIL TAPE 1 RFD (DISPOSABLE) ×2 IMPLANT
STAPLE RELOAD 2.5MM WHITE (STAPLE) ×16 IMPLANT
STAPLE RELOAD 45MM GOLD (STAPLE) ×6 IMPLANT
STAPLER RELOAD GOLD 60MM (STAPLE) ×22
STAPLER RELOAD GREEN 60MM (STAPLE) ×2
STAPLER VASCULAR ECHELON 35 (CUTTER) ×1 IMPLANT
SUT PROLENE 4 0 RB 1 (SUTURE) ×1
SUT PROLENE 4-0 RB1 .5 CRCL 36 (SUTURE) IMPLANT
SUT SILK  1 MH (SUTURE) ×1
SUT SILK 1 MH (SUTURE) ×2 IMPLANT
SUT SILK 1 TIES 10X30 (SUTURE) ×1 IMPLANT
SUT SILK 2 0 SH (SUTURE) ×1 IMPLANT
SUT SILK 2 0SH CR/8 30 (SUTURE) ×1 IMPLANT
SUT SILK 3 0 SH 30 (SUTURE) IMPLANT
SUT SILK 3 0SH CR/8 30 (SUTURE) ×1 IMPLANT
SUT VIC AB 1 CTX 36 (SUTURE) ×1
SUT VIC AB 1 CTX36XBRD ANBCTR (SUTURE) ×1 IMPLANT
SUT VIC AB 2-0 CTX 36 (SUTURE) ×2 IMPLANT
SUT VIC AB 3-0 MH 27 (SUTURE) IMPLANT
SUT VIC AB 3-0 X1 27 (SUTURE) ×2 IMPLANT
SUT VICRYL 0 UR6 27IN ABS (SUTURE) ×1 IMPLANT
SUT VICRYL 2 TP 1 (SUTURE) IMPLANT
SYR 10ML LL (SYRINGE) ×1 IMPLANT
SYR 30ML LL (SYRINGE) ×2 IMPLANT
SYSTEM SAHARA CHEST DRAIN ATS (WOUND CARE) ×2 IMPLANT
TAPE CLOTH 4X10 WHT NS (GAUZE/BANDAGES/DRESSINGS) ×2 IMPLANT
TAPE CLOTH SURG 4X10 WHT LF (GAUZE/BANDAGES/DRESSINGS) ×1 IMPLANT
TIP APPLICATOR SPRAY EXTEND 16 (VASCULAR PRODUCTS) IMPLANT
TIP SPRAY PROGEL 11IN (MISCELLANEOUS) IMPLANT
TOWEL GREEN STERILE (TOWEL DISPOSABLE) ×2 IMPLANT
TOWEL GREEN STERILE FF (TOWEL DISPOSABLE) ×2 IMPLANT
TRAY FOLEY MTR SLVR 16FR STAT (SET/KITS/TRAYS/PACK) ×2 IMPLANT
TROCAR XCEL BLADELESS 5X75MML (TROCAR) ×2 IMPLANT
WATER STERILE IRR 1000ML POUR (IV SOLUTION) ×2 IMPLANT

## 2018-12-15 NOTE — Anesthesia Procedure Notes (Signed)
Procedure Name: Intubation Date/Time: 12/15/2018 8:07 AM Performed by: Janyce Llanos, RN Pre-anesthesia Checklist: Patient identified, Emergency Drugs available, Suction available and Patient being monitored Patient Re-evaluated:Patient Re-evaluated prior to induction Oxygen Delivery Method: Circle system utilized Preoxygenation: Pre-oxygenation with 100% oxygen Induction Type: IV induction Ventilation: Mask ventilation without difficulty and Oral airway inserted - appropriate to patient size Laryngoscope Size: Mac and 3 Grade View: Grade I Tube type: Oral Endobronchial tube: Left, EBT position confirmed by auscultation, EBT position confirmed by fiberoptic bronchoscope and Double lumen EBT and 35 Fr Number of attempts: 1 Airway Equipment and Method: Stylet and Oral airway Placement Confirmation: ETT inserted through vocal cords under direct vision,  positive ETCO2 and breath sounds checked- equal and bilateral Secured at: 30 cm Tube secured with: Tape Dental Injury: Teeth and Oropharynx as per pre-operative assessment

## 2018-12-15 NOTE — Transfer of Care (Signed)
Immediate Anesthesia Transfer of Care Note  Patient: Laura Abbott  Procedure(s) Performed: VIDEO ASSISTED THORACOSCOPY (VATS)/LEFT UPPER LOBECTOMY (Left Chest) NODE DISSECTION LEFT LUNG (Left Chest)  Patient Location: PACU  Anesthesia Type:General  Level of Consciousness: drowsy  Airway & Oxygen Therapy: Patient Spontanous Breathing and Patient connected to face mask oxygen  Post-op Assessment: Report given to RN and Post -op Vital signs reviewed and stable  Post vital signs: Reviewed and stable  Last Vitals:  Vitals Value Taken Time  BP    Temp    Pulse 73 12/15/2018 12:15 PM  Resp 22 12/15/2018 12:16 PM  SpO2 100 % 12/15/2018 12:16 PM  Vitals shown include unvalidated device data.  Last Pain:  Vitals:   12/15/18 0555  TempSrc: Oral         Complications: No apparent anesthesia complications

## 2018-12-15 NOTE — Progress Notes (Signed)
Pt came to unit as transfer from [place]. Comfortable in bed, with c/o pain to shoulder , ivf infusing, ct and u/cath insitu. care continues.

## 2018-12-15 NOTE — Anesthesia Preprocedure Evaluation (Signed)
Anesthesia Evaluation  Patient identified by MRN, date of birth, ID band Patient awake    Reviewed: Allergy & Precautions, NPO status , Patient's Chart, lab work & pertinent test results  Airway Mallampati: II  TM Distance: >3 FB Neck ROM: Full    Dental no notable dental hx. (+) Edentulous Upper, Poor Dentition, Dental Advisory Given,    Pulmonary asthma , pneumonia, COPD, former smoker,    Pulmonary exam normal breath sounds clear to auscultation       Cardiovascular hypertension, Normal cardiovascular exam Rhythm:Regular Rate:Normal  EKG 12/11/2018 SR LBBB and LVH R 69   Neuro/Psych PSYCHIATRIC DISORDERS Depression negative neurological ROS     GI/Hepatic Neg liver ROS, GERD  Medicated,  Endo/Other  negative endocrine ROS  Renal/GU negative Renal ROS     Musculoskeletal  (+) Arthritis ,   Abdominal (+) + obese,   Peds  Hematology negative hematology ROS (+)   Anesthesia Other Findings   Reproductive/Obstetrics                             Lab Results  Component Value Date   CREATININE 0.49 12/04/2018   BUN 14 12/04/2018   NA 139 12/04/2018   K 4.2 12/04/2018   CL 103 12/04/2018   CO2 28 12/04/2018    Lab Results  Component Value Date   WBC 8.1 12/04/2018   HGB 14.2 12/04/2018   HCT 46.0 12/04/2018   MCV 96.0 12/04/2018   PLT 276 12/04/2018    Anesthesia Physical Anesthesia Plan  ASA: III  Anesthesia Plan: General   Post-op Pain Management:    Induction: Intravenous  PONV Risk Score and Plan: 3 and Treatment may vary due to age or medical condition, Ondansetron, Dexamethasone and Midazolam  Airway Management Planned: Double Lumen EBT and Oral ETT  Additional Equipment: Arterial line  Intra-op Plan:   Post-operative Plan: Extubation in OR  Informed Consent: I have reviewed the patients History and Physical, chart, labs and discussed the procedure including the  risks, benefits and alternatives for the proposed anesthesia with the patient or authorized representative who has indicated his/her understanding and acceptance.     Dental advisory given  Plan Discussed with:   Anesthesia Plan Comments:         Anesthesia Quick Evaluation

## 2018-12-15 NOTE — Progress Notes (Signed)
[facility]  Patient Status: Active (receiving services up to time of hospitalization)  [facility] is providing the following services: RN  If patient discharges after hours, please call [phone].   Laura Abbott 12/15/2018, 3:42 PM

## 2018-12-15 NOTE — Interval H&P Note (Signed)
History and Physical Interval Note: PFTs OK  12/15/2018 7:27 AM  Laura Abbott  has presented today for surgery, with the diagnosis of LEFT UPPER LOBE MASS  The various methods of treatment have been discussed with the patient and family. After consideration of risks, benefits and other options for treatment, the patient has consented to  Procedure(s): VIDEO ASSISTED THORACOSCOPY (VATS)/LEFT UPPER LOBECTOMY (Left) as a surgical intervention .  The patient's history has been reviewed, patient examined, no change in status, stable for surgery.  I have reviewed the patient's chart and labs.  Questions were answered to the patient's satisfaction.     Melrose Nakayama

## 2018-12-15 NOTE — Anesthesia Procedure Notes (Signed)
Arterial Line Insertion Start/End1/20/2020 6:55 AM, 12/15/2018 7:02 AM Performed by: Barnet Glasgow, MD, [place]field, Tommy Rainwater., CRNA  Left, radial was placed Catheter size: 20 G Hand hygiene performed , maximum sterile barriers used  and Seldinger technique used Allen's test indicative of satisfactory collateral circulation Attempts: 2 Procedure performed without using ultrasound guided technique. Additional procedure comments: By Delphina Cahill.

## 2018-12-15 NOTE — Progress Notes (Signed)
Dr Servando Snare called and informed of relatives concern for fluid overload, ivf infusion decreased to 75cc/ hr.

## 2018-12-15 NOTE — Brief Op Note (Addendum)
12/15/2018  11:56 AM  PATIENT:  Laura Abbott  73 y.o. female  PRE-OPERATIVE DIAGNOSIS:  LEFT UPPER LOBE MASS  POST-OPERATIVE DIAGNOSIS:  LEFT UPPER LOBE MASS  PROCEDURE:  Procedure(s): VIDEO ASSISTED THORACOSCOPY (VATS)/LEFT UPPER LOBECTOMY (Left) NODE DISSECTION LEFT LUNG (Left) INTERCOSTAL NERVE BLOCKS  SURGEON:  Surgeon(s) and Role:    * Melrose Nakayama, MD - Primary  PHYSICIAN ASSISTANT: M. Roddenberry, PA-C  ANESTHESIA:   general  EBL:  650 mL   BLOOD ADMINISTERED:none  DRAINS: Left 20fr. pleural tube   LOCAL MEDICATIONS USED:  MARCAINE     SPECIMEN:  Source of Specimen:  Left upper lung lobe, multiple lumph nodes  DISPOSITION OF SPECIMEN:  PATHOLOGY  COUNTS:  YES  DICTATION: .Dragon Dictation  PLAN OF CARE: Admit to inpatient   PATIENT DISPOSITION:  PACU - hemodynamically stable.   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

## 2018-12-15 NOTE — Anesthesia Postprocedure Evaluation (Signed)
Anesthesia Post Note  Patient: Laura Abbott  Procedure(s) Performed: VIDEO ASSISTED THORACOSCOPY (VATS)/LEFT UPPER LOBECTOMY (Left Chest) NODE DISSECTION LEFT LUNG (Left Chest)     Patient location during evaluation: PACU Anesthesia Type: General Level of consciousness: awake and alert Pain management: pain level controlled Vital Signs Assessment: post-procedure vital signs reviewed and stable Respiratory status: spontaneous breathing, nonlabored ventilation, respiratory function stable and patient connected to nasal cannula oxygen Cardiovascular status: blood pressure returned to baseline and stable Postop Assessment: no apparent nausea or vomiting Anesthetic complications: no    Last Vitals:  Vitals:   12/15/18 1330 12/15/18 1458  BP: (!) 130/53 (!) 148/67  Pulse: 71   Resp: 12   Temp:  36.7 C  SpO2: 98% 95%    Last Pain:  Vitals:   12/15/18 1458  TempSrc: Oral  PainSc:                  Barnet Glasgow

## 2018-12-16 ENCOUNTER — Encounter (HOSPITAL_COMMUNITY): Payer: Self-pay | Admitting: Thoracic Surgery (Cardiothoracic Vascular Surgery)

## 2018-12-16 ENCOUNTER — Inpatient Hospital Stay (HOSPITAL_COMMUNITY): Payer: Medicare HMO

## 2018-12-16 ENCOUNTER — Other Ambulatory Visit: Payer: Self-pay

## 2018-12-16 LAB — BASIC METABOLIC PANEL
Anion gap: 7 (ref 5–15)
BUN: 5 mg/dL — AB (ref 8–23)
CO2: 28 mmol/L (ref 22–32)
Calcium: 7.5 mg/dL — ABNORMAL LOW (ref 8.9–10.3)
Chloride: 105 mmol/L (ref 98–111)
Creatinine, Ser: 0.46 mg/dL (ref 0.44–1.00)
GFR calc Af Amer: >60 mL/min (ref >60)
GFR calc non Af Amer: >60 mL/min (ref >60)
Glucose, Bld: 116 mg/dL — ABNORMAL HIGH (ref 70–99)
Potassium: 3.3 mmol/L — ABNORMAL LOW (ref 3.5–5.1)
Sodium: 140 mmol/L (ref 135–145)

## 2018-12-16 LAB — GLUCOSE, CAPILLARY
GLUCOSE-CAPILLARY: 126 mg/dL — AB (ref 70–99)
Glucose-Capillary: 105 mg/dL — ABNORMAL HIGH (ref 70–99)
Glucose-Capillary: 126 mg/dL — ABNORMAL HIGH (ref 70–99)
Glucose-Capillary: 130 mg/dL — ABNORMAL HIGH (ref 70–99)

## 2018-12-16 LAB — CBC
HCT: 30.6 % — ABNORMAL LOW (ref 36.0–46.0)
Hemoglobin: 9.6 g/dL — ABNORMAL LOW (ref 12.0–15.0)
MCH: 30.6 pg (ref 26.0–34.0)
MCHC: 31.4 g/dL (ref 30.0–36.0)
MCV: 97.5 fL (ref 80.0–100.0)
Platelets: 225 10*3/uL (ref 150–400)
RBC: 3.14 MIL/uL — ABNORMAL LOW (ref 3.87–5.11)
RDW: 13.4 % (ref 11.5–15.5)
WBC: 17.6 10*3/uL — AB (ref 4.0–10.5)
nRBC: 0 % (ref 0.0–0.2)

## 2018-12-16 LAB — BLOOD GAS, ARTERIAL
Acid-Base Excess: 3.9 mmol/L — ABNORMAL HIGH (ref 0.0–2.0)
Bicarbonate: 29.2 mmol/L — ABNORMAL HIGH (ref 20.0–28.0)
Drawn by: 347191
O2 Content: 2 L/min
O2 Saturation: 98.6 %
Patient temperature: 98.2
pCO2 arterial: 54.2 mmHg — ABNORMAL HIGH (ref 32.0–48.0)
pH, Arterial: 7.35 (ref 7.350–7.450)
pO2, Arterial: 135 mmHg — ABNORMAL HIGH (ref 83.0–108.0)

## 2018-12-16 MED ORDER — POTASSIUM CHLORIDE 10 MEQ/50ML IV SOLN: 10.0000 meq | INTRAVENOUS | Status: AC

## 2018-12-16 MED ORDER — METOPROLOL TARTRATE 50 MG PO TABS
50.0000 mg | ORAL_TABLET | Freq: Every day | ORAL | Status: DC
  Administered 2018-12-16 – 2018-12-19 (×4): 50 mg via ORAL
  Filled 2018-12-16 (×4): qty 1

## 2018-12-16 MED ORDER — ALBUTEROL SULFATE (2.5 MG/3ML) 0.083% IN NEBU
2.5000 mg | INHALATION_SOLUTION | Freq: Four times a day (QID) | RESPIRATORY_TRACT | Status: DC
  Administered 2018-12-16 – 2018-12-17 (×4): 2.5 mg via RESPIRATORY_TRACT
  Filled 2018-12-16 (×5): qty 3

## 2018-12-16 MED ORDER — POTASSIUM CHLORIDE CRYS ER 20 MEQ PO TBCR
40.0000 meq | EXTENDED_RELEASE_TABLET | Freq: Two times a day (BID) | ORAL | Status: DC
  Administered 2018-12-16 – 2018-12-20 (×8): 40 meq via ORAL
  Filled 2018-12-16 (×8): qty 2

## 2018-12-16 MED ORDER — POTASSIUM CHLORIDE CRYS ER 20 MEQ PO TBCR
40.0000 meq | EXTENDED_RELEASE_TABLET | Freq: Once | ORAL | Status: AC
  Administered 2018-12-16: 40 meq via ORAL
  Filled 2018-12-16: qty 2

## 2018-12-16 MED ORDER — METOPROLOL TARTRATE 50 MG PO TABS
75.0000 mg | ORAL_TABLET | Freq: Every morning | ORAL | Status: DC
  Administered 2018-12-16 – 2018-12-20 (×5): 75 mg via ORAL
  Filled 2018-12-16 (×5): qty 1

## 2018-12-16 MED ORDER — POTASSIUM CHLORIDE 10 MEQ/50ML IV SOLN: 10.0000 meq | INTRAVENOUS | Status: DC

## 2018-12-16 MED ORDER — POTASSIUM CHLORIDE 10 MEQ/50ML IV SOLN
10.0000 meq | INTRAVENOUS | Status: AC
  Filled 2018-12-16: qty 50

## 2018-12-16 NOTE — Op Note (Signed)
NAME: MERRI, DIMAANO MEDICAL RECORD [id number] ACCOUNT [id number] DATE OF BIRTH:October 15, 1946 FACILITY: [facility] LOCATION: [facility] PHYSICIAN:Everton Bertha Chaya Jan, MD  OPERATIVE REPORT  DATE OF PROCEDURE:  12/15/2018  PREOPERATIVE DIAGNOSIS:  Left upper lobe mass.  POSTOPERATIVE DIAGNOSIS:  Left upper lobe mass.  PROCEDURE:   Left video-assisted thoracoscopy, Thoracoscopic Left upper lobectomy, Mediastinal lymph node sampling, Intercostal nerve blocks 3rd- 9th interspaces.  SURGEON:  Modesto Charon, MD  ASSISTANT:  Enid Cutter, PA.  ANESTHESIA:  General.  FINDINGS:  The operation was difficult due to body habitus and slow deflation of the lung.  Intense inflammatory reaction along bronchus.  The frozen section showed an indeterminate cancer.  Bronchial margin was negative for tumor.  CLINICAL NOTE:  The patient is a 73 year old woman with a history of tobacco abuse who was found to have a 4.5 cm left upper lobe mass on chest x-ray.  PET CT confirmed the mass and it was found to be markedly hypermetabolic.  There was no evidence of  regional or distant metastatic disease.  She was advised to undergo left upper lobectomy for definitive diagnosis and treatment of the lesion.  The indications, risks, benefits, and alternatives were discussed in detail with the patient.  She understood  and accepted the risks and agreed to proceed.  OPERATIVE NOTE:  The patient was brought to the preoperative holding area on 12/15/2018.  Anesthesia placed a central venous line and an arterial blood pressure monitoring line.  She was taken to the operating room, anesthetized and intubated with a  double lumen endotracheal tube.  A Foley catheter was placed.  Intravenous antibiotics were administered.  Sequential compression devices were placed on the calves for DVT prophylaxis.  She was placed in a right lateral decubitus position and the left  chest was prepped and draped in the usual sterile  fashion.  Single lung ventilation of the right lung was initiated and was tolerated well throughout the procedure.  A timeout was performed.  A solution containing 20 mL of liposomal bupivacaine, 30 mL of 0.5% bupivacaine and 50 mL of saline was prepared.  This was used for local anesthesia at the incisions and for the intercostal nerve blocks.  An area was injected in the  midaxillary line in the 7th intercostal space.  A port incision was made and a 5 mm port was inserted into the chest.  The thoracoscope was advanced into the chest.  There was good isolation of the left lung, but it was very slow to deflate.  A 5 cm  working incision was made in the 4th interspace anterolaterally.  This was done after injecting the area with bupivacaine solution.  A second port incision was made anterior to the first.  The fissure was incomplete.  An Echelon 60 mm stapler with gold  cartridges was used to complete the anterior portion of the fissure between the lingula and the lower lobe.  The lower lobe was elevated and the inferior pulmonary ligament was divided with Harmonic scalpel.  A level 9 lymph node was removed.  All nodes  that were removed were sent as separate specimens for permanent pathology.  The pleural reflection was divided posteriorly up to the level of the hilum.  Anteriorly, there were some adhesions to the mediastinum.  These were thin filmy adhesions which  were divided with the Harmonic scalpel.  The superior pulmonary vein was inspected.  The lingular branches were identified.  A smaller lingular branch was divided with the Harmonic scalpel.  A second  slightly larger, but less than 6 mm vessel, then was  divided with the Harmonic scalpel. As this was done there was bleeding from the distal portion of the vein and the lung parenchyma.  Clips were applied to control the bleeding.  A third larger branch was dissected out, encircled and divided with the  vascular stapler.  The remainder of the  superior pulmonary vein was more densely adherent, particularly in the area of the bronchus.  This was slowly dissected out over time.  Attention was turned back to the fissure.  The bronchial bifurcation was  identified and there was a level 11 node at that site.  This was dissected out and removed.  There was bleeding with that as well.  The basilar segmental pulmonary artery was identified.  The fissure over top of the PA was incomplete.  It was divided with  sequential firings of the Echelon stapler.  Only 2-3 centimeter of the fissure could be divided at a time and this was a slow and tedious process.  Ultimately, the entire fissure was divided with the stapler.  Gold cartridges were used throughout.  The  lingular arterial branches then were identified.  These were rather easily dissected out and divided with the vascular stapler. A posterior segmental branch also was dissected out and divided with the stapler.  Anteriorly, the superior pulmonary vein  now was completely dissected off and divided with the vascular stapler.  There was a relatively dense fibrous tissue around a level 10 node and parts of that node were removed and sent for permanent pathology.  The anterior pulmonary artery branch was  within this relatively fibrous tissue.  The Echelon stapler then was placed across the bronchus including that small pulmonary arterial branch.  A green cartridge was used.  The stapler was closed.  A test inflation showed good aeration of the lower  lobe.  The stapler then was fired, transecting the bronchus.  There was a small residual area of tissue that was not divided.  This was stapled with the Echelon stapler using a gold cartridge completing the lobectomy.  The left upper lobe was placed into an  endoscopic retrieval bag, removed through the working incision and sent for frozen section on the mass and bronchial margin.  While awaiting those results, intercostal nerve blocks were performed from the  third to the ninth interspace.  A spinal needle  was inserted from a posterior approach and 8 mL of the bupivacaine solution was injected into a subpleural plane at each interspace.  The frozen section returned with indeterminate on the mass.  Further stains will be necessary for definitive  identification.  The bronchial margin was free of tumor.  The chest was copiously irrigated with warm saline.  A test inflation to 30 cm of water revealed no leakage from the bronchial stump.  Progel was applied to the staple line on the lower lobe.  A  28 French chest tube was placed through the anterior port incision and secured with a #1 silk suture.  Dual-lung ventilation was resumed.  The scope was removed.  The posterior port incision was closed with a 0 Vicryl fascial suture followed by 3-0  Vicryl subcuticular suture.  The anterior working incision was closed in 3 layers with a #1 Vicryl fascial suture followed by 2-0 Vicryl subcutaneous suture and 3-0 Vicryl subcuticular suture.  The chest tube was placed to suction.  The patient was  placed back in a supine position.  She then was  extubated in the operating room and taken to the [facility] in good condition.  TN/NUANCE  D:12/15/2018 T:12/16/2018 JOB:[id number]

## 2018-12-16 NOTE — Progress Notes (Addendum)
      [address]       [place] [address]             [phone]      1 Day Post-Op Procedure(s) (LRB): VIDEO ASSISTED THORACOSCOPY (VATS)/LEFT UPPER LOBECTOMY (Left) NODE DISSECTION LEFT LUNG (Left)   Subjective:  Patient complains of pain.  States it hurts to breath.  She is getting relief with PCA.  Denies N/V  Objective: Vital signs in last 24 hours: Temp:  [98 F (36.7 C)-98.8 F (37.1 C)] 98.3 F (36.8 C) (01/21 0500) Pulse Rate:  [70-83] 79 (01/21 0500) Cardiac Rhythm: Normal sinus rhythm (01/21 0500) Resp:  [12-23] 16 (01/21 0500) BP: (119-150)/(46-67) 150/56 (01/21 0500) SpO2:  [90 %-100 %] 96 % (01/21 0753) Arterial Line BP: (132-160)/(49-56) 160/52 (01/21 0500)  Intake/Output from previous day: 01/20 0701 - 01/21 0700 In: 3975 [I.V.:3375; IV Piggyback:600] Out: 2541 [Urine:1675; Blood:650; Chest Tube:216]  General appearance: alert, cooperative and no distress Heart: regular rate and rhythm Lungs: clear to auscultation bilaterally Abdomen: soft, non-tender; bowel sounds normal; no masses,  no organomegaly Extremities: extremities normal, atraumatic, no cyanosis or edema Wound: clean and dry  Lab Results: Recent Labs    12/15/18 1028 12/16/18 0533  WBC  --  17.6*  HGB 12.2 9.6*  HCT 36.0 30.6*  PLT  --  225   BMET:  Recent Labs    12/15/18 1028 12/16/18 0533  NA 140 140  K 4.4 3.3*  CL  --  105  CO2  --  28  GLUCOSE  --  116*  BUN  --  5*  CREATININE  --  0.46  CALCIUM  --  7.5*    PT/INR: No results for input(s): LABPROT, INR in the last 72 hours. ABG    Component Value Date/Time   PHART 7.350 12/16/2018 0455   HCO3 29.2 (H) 12/16/2018 0455   TCO2 33 (H) 12/15/2018 1028   O2SAT 98.6 12/16/2018 0455   CBG (last 3)  Recent Labs    12/15/18 1510 12/15/18 1615 12/15/18 2131  GLUCAP 130* 123* 124*    Assessment/Plan: S/P Procedure(s) (LRB): VIDEO ASSISTED THORACOSCOPY (VATS)/LEFT UPPER LOBECTOMY  (Left) NODE DISSECTION LEFT LUNG (Left)  1. Chest tube- no air leak present, CXR not completed, order has been placed, if free of pneumothorax will place chest tube to water seal 2. CV- NSR, + HTN- on home Lopressor, Lisinopril, will watch can titrate Lisinopril if needed 3. Pulm- CXR pending, patient doesn't have an IS, have requested, continue nebs 4. Hypokalemia- will give 40 meq today, repeat BMET in AM 5. D/c Arterial Line 6. IV Fluids to KVO 7. Lovenox for DVT 8. Dispo- patient with expected post operative pain, getting relief with PCA, CXR has been ordered, no air leak on chest tube, if CXR okay will place to water seal today, supplement K, watch BP on home medications may need to titrate Lisinopril, repeat CXR in AM   LOS: 1 day    Ellwood Handler 12/16/2018 Patient seen and examined, agree with above No air leak- will place CT to water seal Needs IS, will order flutter valve as well Ambulate  Remo Lipps C. Roxan Hockey, MD [facility] [phone]

## 2018-12-17 ENCOUNTER — Inpatient Hospital Stay (HOSPITAL_COMMUNITY): Payer: Medicare HMO

## 2018-12-17 LAB — GLUCOSE, CAPILLARY
GLUCOSE-CAPILLARY: 118 mg/dL — AB (ref 70–99)
Glucose-Capillary: 111 mg/dL — ABNORMAL HIGH (ref 70–99)
Glucose-Capillary: 112 mg/dL — ABNORMAL HIGH (ref 70–99)

## 2018-12-17 LAB — COMPREHENSIVE METABOLIC PANEL
ALK PHOS: 42 U/L (ref 38–126)
ALT: 20 U/L (ref 0–44)
AST: 23 U/L (ref 15–41)
Albumin: 3 g/dL — ABNORMAL LOW (ref 3.5–5.0)
Anion gap: 7 (ref 5–15)
BILIRUBIN TOTAL: 1 mg/dL (ref 0.3–1.2)
BUN: 6 mg/dL — ABNORMAL LOW (ref 8–23)
CO2: 28 mmol/L (ref 22–32)
Calcium: 8.1 mg/dL — ABNORMAL LOW (ref 8.9–10.3)
Chloride: 103 mmol/L (ref 98–111)
Creatinine, Ser: 0.38 mg/dL — ABNORMAL LOW (ref 0.44–1.00)
GFR calc Af Amer: >60 mL/min (ref >60)
GFR calc non Af Amer: >60 mL/min (ref >60)
Glucose, Bld: 114 mg/dL — ABNORMAL HIGH (ref 70–99)
Potassium: 4.3 mmol/L (ref 3.5–5.1)
Sodium: 138 mmol/L (ref 135–145)
TOTAL PROTEIN: 5.6 g/dL — AB (ref 6.5–8.1)

## 2018-12-17 LAB — CBC
HCT: 29.7 % — ABNORMAL LOW (ref 36.0–46.0)
HEMOGLOBIN: 9.1 g/dL — AB (ref 12.0–15.0)
MCH: 30.2 pg (ref 26.0–34.0)
MCHC: 30.6 g/dL (ref 30.0–36.0)
MCV: 98.7 fL (ref 80.0–100.0)
Platelets: 242 10*3/uL (ref 150–400)
RBC: 3.01 MIL/uL — AB (ref 3.87–5.11)
RDW: 13.9 % (ref 11.5–15.5)
WBC: 15.1 10*3/uL — ABNORMAL HIGH (ref 4.0–10.5)
nRBC: 0 % (ref 0.0–0.2)

## 2018-12-17 MED ORDER — POLYETHYLENE GLYCOL 3350 17 G PO PACK: 17.0000 g | PACK | Freq: Every day | ORAL | Status: DC

## 2018-12-17 MED ORDER — ALBUTEROL SULFATE (2.5 MG/3ML) 0.083% IN NEBU
2.5000 mg | INHALATION_SOLUTION | Freq: Two times a day (BID) | RESPIRATORY_TRACT | Status: DC
  Administered 2018-12-17 – 2018-12-18 (×2): 2.5 mg via RESPIRATORY_TRACT
  Filled 2018-12-17 (×2): qty 3

## 2018-12-17 MED ORDER — POLYETHYLENE GLYCOL 3350 17 G PO PACK
17.0000 g | PACK | Freq: Every day | ORAL | Status: DC | PRN
  Administered 2018-12-17: 17 g via ORAL
  Filled 2018-12-17: qty 1

## 2018-12-17 MED ORDER — GUAIFENESIN ER 600 MG PO TB12
600.0000 mg | ORAL_TABLET | Freq: Two times a day (BID) | ORAL | Status: DC
  Administered 2018-12-17 – 2018-12-20 (×7): 600 mg via ORAL
  Filled 2018-12-17 (×7): qty 1

## 2018-12-17 NOTE — Progress Notes (Signed)
Pt encouraged OOB for meals. She ambulates 100 feet in hallway. Pain maintained <6 today. Miralax given:awaiting bowel movement.

## 2018-12-17 NOTE — Progress Notes (Addendum)
2 Days Post-Op Procedure(s) (LRB): VIDEO ASSISTED THORACOSCOPY (VATS)/LEFT UPPER LOBECTOMY (Left) NODE DISSECTION LEFT LUNG (Left) Subjective: Sleepy but awakens easily.  Still having some left chest discomfort but she says control is adequate with the PCA.  Passing flatus.  No BM yet`  Objective: Vital signs in last 24 hours: Temp:  [97.5 F (36.4 C)-99.9 F (37.7 C)] 98.4 F (36.9 C) (01/22 0737) Pulse Rate:  [65-81] 65 (01/22 0737) Cardiac Rhythm: Normal sinus rhythm (01/22 0700) Resp:  [14-25] 21 (01/22 0737) BP: (131-146)/(43-61) 142/43 (01/22 0737) SpO2:  [91 %-100 %] 91 % (01/22 0737) Weight:  [93.4 kg] 93.4 kg (01/21 2345)  Hemodynamic parameters for last 24 hours:    Intake/Output from previous day: 01/21 0701 - 01/22 0700 In: 737.8 [P.O.:240; I.V.:497.8] Out: 370 [Urine:200; Chest Tube:170] Intake/Output this shift: No intake/output data recorded.  General appearance: alert, cooperative, no distress and Using nasal cannula oxygen at about 1.5 L/min.  O2 sat 98% Heart: regular rate and rhythm Lungs: Breath sounds are clear anterior.  Cough is weak.  The left chest tube is secured.  No air leak observed during the cough. Wound: Left chest incision is covered with dry dressing  Lab Results: Recent Labs    12/16/18 0533 12/17/18 0224  WBC 17.6* 15.1*  HGB 9.6* 9.1*  HCT 30.6* 29.7*  PLT 225 242   BMET:  Recent Labs    12/16/18 0533 12/17/18 0224  NA 140 138  K 3.3* 4.3  CL 105 103  CO2 28 28  GLUCOSE 116* 114*  BUN 5* 6*  CREATININE 0.46 0.38*  CALCIUM 7.5* 8.1*    PT/INR: No results for input(s): LABPROT, INR in the last 72 hours. ABG    Component Value Date/Time   PHART 7.350 12/16/2018 0455   HCO3 29.2 (H) 12/16/2018 0455   TCO2 33 (H) 12/15/2018 1028   O2SAT 98.6 12/16/2018 0455   CBG (last 3)  Recent Labs    12/16/18 1713 12/16/18 2232 12/17/18 0740  GLUCAP 130* 126* 111*    Assessment/Plan: S/P Procedure(s) (LRB): VIDEO  ASSISTED THORACOSCOPY (VATS)/LEFT UPPER LOBECTOMY (Left) NODE DISSECTION LEFT LUNG (Left)  1.  Postop day 2 left upper lobectomy and mediastinal lymph node sampling for suspected lung cancer.  Path is pending.  She is making satisfactory progress.  No air leak and minimal drainage per CT.  Chest x-ray shows good expansion of the left lung with no pneumothorax.  Expected can remove the chest tube later today.  Also plan to discontinue PCA today. 2.  Pulm.  She has a weak but coarse cough and a sensation of mucus that she cannot clear.  Encouraged continued use of the incentive spirometer and flutter valve.  We will add Mucinex p.o. twice daily.  Advance activity. 3.  Hypokalemia: Supplemented yesterday, repeat potassium 4.3.  Monitor. 4.  DVT  PPX-continue subcutaneous Lovenox 5.  HTN--BP acceptable.  Continue her current dose of lisinopril and metoprolol 6.  Mild leukocytosis: Expected postoperatively.  Encouraging pulmonary hygiene.  Monitor 7.  Expected acute blood loss anemia: Stable, no indication for transfusion.  Monitor  LOS: 2 days    Antony Odea 12/17/2018 Patient seen and examined, agree with above needs to improve with IS, cough No air leal- dc chest tube  Remo Lipps C. Roxan Hockey, MD [facility] [phone]

## 2018-12-17 NOTE — Discharge Summary (Addendum)
Physician Discharge Summary  Patient ID: SHA BURLING MRN: [id number] DOB/AGE: February 13, 1946 73 y.o.  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 Leonides Schanz is a 73 year old female with a history of chronic obstructive pulmonary disease, 60-pack-year history of cigarette smoking, hypertension, dyslipidemia, gastroesophageal reflux disease, anxiety, and who is status post right breast lumpectomy for DCIS.  About 3 months prior to this admission, she developed shortness of breath that was associated with an episode of bronchitis.  She sought medical attention and had a chest x-ray that demonstrated a left upper lobe mass.  She went on to have a PET CT that redemonstrated a 4.5 cm left upper lobe mass that was markedly hypermetabolic with an SUV of 18.  There was no demonstrable regional or distant metastatic disease.  She was referred to Dr. Roxan Hockey for surgical evaluation and management.  Following review of her clinical history and evaluation in the office, left video-assisted thoracoscopy with left upper lobectomy was recommended to the patient.  She decided to proceed with surgery and was admitted for same-day surgery on 12/15/2018 and taken to the operating room where left video-assisted thoracoscopy with left upper lobectomy and mediastinal lymph node sampling was carried out.  Following the procedure, she was transferred to the postanesthesia care unit where she was extubated.  Her respiratory status remained stable.  She was transferred to the progressive care unit.  Pain was initially managed with a PCA which proved adequate.  Follow-up chest x-ray was satisfactory with no evidence of pneumothorax or  subcutaneous air.  She did not have any significant air leak following the procedure.  Chest tube was removed on postop day 2.  Follow up CXR remained stable.  There was no evidence of pneumothorax.  The patient is deconditioned.  She has worked with PT and home arrangements have been made.  She will continue to require home oxygen at home and this also has been arranged.  She is ambulating with assistance of a walker.  She had some confusion which has resolved.  Her incision is healing without evidence of infection.  She is medically stable for discharge home today.  Significant Diagnostic Studies:   Study Result    EXAM: NUCLEAR MEDICINE PET SKULL BASE TO THIGH  TECHNIQUE: 11.86 mCi F-18 FDG was injected intravenously. Full-ring PET imaging was performed from the skull base to thigh after the radiotracer. CT data was obtained and used for attenuation correction and anatomic localization.  Fasting blood glucose: 88 mg/dl  COMPARISON:  CT chest 10/24/2018  FINDINGS: Mediastinal blood pool activity: SUV max 2.6  NECK: No hypermetabolic lymph nodes in the neck.  Incidental CT findings: none  CHEST: No hypermetabolic axillary or supraclavicular lymph nodes. No hypermetabolic mediastinal or hilar lymph nodes.  Solid mass within the left upper lobe measures 4.9 cm and has an SUV max 18.35. No additional pulmonary nodules. Subsegmental atelectasis versus scar noted in the right middle lobe, lingula and both lower lobes.  Incidental CT findings: Aortic atherosclerosis. Calcifications within the LAD, left circumflex and RCA coronary arteries noted.  ABDOMEN/PELVIS: No abnormal hypermetabolic activity within the liver, pancreas, adrenal glands, or spleen. No hypermetabolic lymph nodes in the abdomen or pelvis.  Incidental CT findings: Aortic atherosclerosis. Previous cholecystectomy.  SKELETON: No focal hypermetabolic  activity to suggest  skeletal metastasis.  Incidental CT findings: none  IMPRESSION: 1. Left upper lobe lung mass is intensely hypermetabolic compatible with primary bronchogenic carcinoma. Assuming a non-small cell histology imaging findings are compatible with T2aN0M0 disease or stage IB disease. 2. Multi vessel coronary artery atherosclerotic disease. 3. Aortic Atherosclerosis (ICD10-I70.0).   Electronically Signed   By: Kerby Moors M.D.   On: 11/18/2018 08:42   PATHOLOGY:  1. Lung, resection (segmental or lobe), Left upper - HIGH GRADE SPINDLE CELL MALIGNANCY, 4.6 CM. - NO VISCERAL PLEURA INVASION IDENTIFIED. - MARGINS OF RESECTION ARE NOT INVOLVED. - TWO LYMPH NODES, NEGATIVE FOR TUMOR (0/2). - SEE COMMENT. 2. Lymph node, biopsy, 9 node - ONE LYMPH NODE, NEGATIVE FOR TUMOR (0/1). 3. Lymph node, biopsy, 5 node - ONE LYMPH NODE, NEGATIVE FOR TUMOR (0/1). 4. Lymph node, biopsy, 11 node - ONE LYMPH NODE, NEGATIVE FOR TUMOR (0/1). 5. Lymph node, biopsy, 11 #2 - ONE LYMPH NODE, NEGATIVE FOR TUMOR (0/1). 6. Lymph node, biopsy, 10 node - FIBROVASCULAR TISSUE. - NO NODAL TISSUE IDENTIFIED. 7. Lymph node, biopsy, 12 node - ONE LYMPH NODE, NEGATIVE FOR TUMOR (0/1). 8. Lymph node, biopsy, 12 #2 - ONE LYMPH NODE, NEGATIVE FOR TUMOR (0/1). 9. Lymph node, biopsy, 13 node - ONE LYMPH NODE, NEGATIVE FOR TUMOR (0/1). 10. Lymph node, biopsy, 10 #2 - ONE LYMPH NODE, NEGATIVE FOR TUMOR (0/1).  Treatments:  OPERATIVE REPORT  DATE OF PROCEDURE:  12/15/2018  PREOPERATIVE DIAGNOSIS:  Left upper lobe mass.  POSTOPERATIVE DIAGNOSIS:  Left upper lobe mass.  PROCEDURE:  Left video-assisted thoracoscopy, left upper lobectomy, mediastinal lymph node sampling, intercostal nerve block.  SURGEON:  [place] Charon, MD  ASSISTANT:  Enid Cutter.  ANESTHESIA:  General.  FINDINGS:  The operation was difficult due to body habitus and slow deflation of the lung.  Intense inflammatory  reaction along bronchus.  The frozen section indeterminate cancer.  Bronchial margin negative for tumor.  Discharge Exam: Blood pressure (!) 149/53, pulse 78, temperature 98.6 F (37 C), temperature source Oral, resp. rate (!) 24, height 5\' 2"  (1.575 m), weight 93.4 kg, SpO2 95 %.   General appearance: alert, cooperative and no distress Heart: regular rate and rhythm Lungs: diminished breath sounds bibasilar Abdomen: soft, non-tender; bowel sounds normal; no masses,  no organomegaly Extremities: extremities normal, atraumatic, no cyanosis or edema Wound: clean and dry  Disposition: Home  Discharge Medications:   Allergies as of 12/20/2018   No Known Allergies     Medication List    STOP taking these medications   HYDROcodone-acetaminophen 5-325 MG tablet Commonly known as:  NORCO/VICODIN     TAKE these medications   acetaminophen 500 MG tablet Commonly known as:  TYLENOL Take 2 tablets (1,000 mg total) by mouth every 6 (six) hours as needed.   albuterol 108 (90 Base) MCG/ACT inhaler Commonly known as:  PROVENTIL HFA;VENTOLIN HFA Inhale 2 puffs into the lungs 3 (three) times daily. Also use the inhaler every 2 hours if needed for worsening shortness of breath and wheezing.   BREO ELLIPTA 100-25 MCG/INH Aepb Generic drug:  fluticasone furoate-vilanterol Inhale 1 puff into the lungs daily.   Calcium 600-200 MG-UNIT tablet Take 1 tablet by mouth daily.   clonazePAM 0.5 MG tablet Commonly known as:  KLONOPIN Take 0.5 mg by mouth at bedtime as needed (sleep).   FLUoxetine 40 MG capsule Commonly known as:  PROZAC Take 40 mg by mouth daily.   fluticasone 50 MCG/ACT nasal spray Commonly known  as:  FLONASE Place 2 sprays into both nostrils daily.   lisinopril 10 MG tablet Commonly known as:  PRINIVIL,ZESTRIL Take 10 mg by mouth daily.   metoprolol tartrate 50 MG tablet Commonly known as:  LOPRESSOR Take 50-75 mg by mouth 2 (two) times daily. Takes 75 mg in the  morning and 50 mg in the evening   nabumetone 500 MG tablet Commonly known as:  RELAFEN Take 500 mg by mouth every morning.   omeprazole 40 MG capsule Commonly known as:  PRILOSEC Take 40 mg by mouth daily.   oxyCODONE 5 MG immediate release tablet Commonly known as:  Oxy IR/ROXICODONE Take 1-2 tablets (5-10 mg total) by mouth every 4 (four) hours as needed for severe pain.   traZODone 50 MG tablet Commonly known as:  DESYREL Take 50 mg by mouth at bedtime.            Durable Medical Equipment  (From admission, onward)         Start     Ordered   12/19/18 1621  For home use only DME 4 wheeled rolling walker with seat  Once    Question:  Patient needs a walker to treat with the following condition  Answer:  Mobility impaired   12/19/18 1621   12/19/18 1608  For home use only DME oxygen  Once    Question Answer Comment  Mode or (Route) Nasal cannula   Liters per Minute 2   Frequency Continuous (stationary and portable oxygen unit needed)   Oxygen conserving device No   Oxygen delivery system Gas      12/19/18 1608         Follow-up Information    Melrose Nakayama, MD Follow up on 01/06/2019.   Specialty:  Cardiothoracic Surgery Why:  Appointment is at 9:45, please get CXR at 9:15 at [facility] located on first floor of our office building Contact information: [place] [address] [phone]        [facility] Follow up on 12/26/2018.   Specialty:  Cardiothoracic Surgery Why:  Appointment is at 10:00 for suture removal with the nurse Contact information: [place], [employer] [phone]          Signed: Ellwood Handler, PA-C 12/20/2018, 8:55 AM

## 2018-12-18 ENCOUNTER — Inpatient Hospital Stay (HOSPITAL_COMMUNITY): Payer: Medicare HMO

## 2018-12-18 LAB — BASIC METABOLIC PANEL
Anion gap: 5 (ref 5–15)
BUN: <5 mg/dL — ABNORMAL LOW (ref 8–23)
CO2: 30 mmol/L (ref 22–32)
Calcium: 8.5 mg/dL — ABNORMAL LOW (ref 8.9–10.3)
Chloride: 101 mmol/L (ref 98–111)
Creatinine, Ser: 0.43 mg/dL — ABNORMAL LOW (ref 0.44–1.00)
GFR calc Af Amer: >60 mL/min (ref >60)
GFR calc non Af Amer: >60 mL/min (ref >60)
GLUCOSE: 121 mg/dL — AB (ref 70–99)
Potassium: 4.6 mmol/L (ref 3.5–5.1)
Sodium: 136 mmol/L (ref 135–145)

## 2018-12-18 LAB — CBC
HCT: 29.3 % — ABNORMAL LOW (ref 36.0–46.0)
Hemoglobin: 9.3 g/dL — ABNORMAL LOW (ref 12.0–15.0)
MCH: 31 pg (ref 26.0–34.0)
MCHC: 31.7 g/dL (ref 30.0–36.0)
MCV: 97.7 fL (ref 80.0–100.0)
Platelets: 232 10*3/uL (ref 150–400)
RBC: 3 MIL/uL — ABNORMAL LOW (ref 3.87–5.11)
RDW: 13.4 % (ref 11.5–15.5)
WBC: 12.6 10*3/uL — ABNORMAL HIGH (ref 4.0–10.5)
nRBC: 0 % (ref 0.0–0.2)

## 2018-12-18 LAB — GLUCOSE, CAPILLARY: Glucose-Capillary: 126 mg/dL — ABNORMAL HIGH (ref 70–99)

## 2018-12-18 MED ORDER — ALBUTEROL SULFATE (2.5 MG/3ML) 0.083% IN NEBU
2.5000 mg | INHALATION_SOLUTION | Freq: Four times a day (QID) | RESPIRATORY_TRACT | Status: DC
  Administered 2018-12-18 – 2018-12-20 (×9): 2.5 mg via RESPIRATORY_TRACT
  Filled 2018-12-18 (×9): qty 3

## 2018-12-18 MED ORDER — ALBUTEROL SULFATE (2.5 MG/3ML) 0.083% IN NEBU
2.5000 mg | INHALATION_SOLUTION | RESPIRATORY_TRACT | Status: DC | PRN
  Administered 2018-12-18: 2.5 mg via RESPIRATORY_TRACT
  Filled 2018-12-18: qty 3

## 2018-12-18 NOTE — Progress Notes (Signed)
3 Days Post-Op Procedure(s) (LRB): VIDEO ASSISTED THORACOSCOPY (VATS)/LEFT UPPER LOBECTOMY (Left) NODE DISSECTION LEFT LUNG (Left) Subjective: C/o feeling sleepy, some incisional pain  Objective: Vital signs in last 24 hours: Temp:  [98.3 F (36.8 C)-98.8 F (37.1 C)] 98.3 F (36.8 C) (01/23 0751) Pulse Rate:  [66-77] 72 (01/23 0804) Cardiac Rhythm: Normal sinus rhythm (01/23 0700) Resp:  [12-21] 18 (01/23 0804) BP: (131-151)/(52-99) 132/99 (01/23 0804) SpO2:  [89 %-98 %] 98 % (01/23 0804)  Hemodynamic parameters for last 24 hours:    Intake/Output from previous day: 01/22 0701 - 01/23 0700 In: 1093.2 [P.O.:800; I.V.:293.2] Out: 325 [Urine:325] Intake/Output this shift: No intake/output data recorded.  General appearance: alert, cooperative and no distress Neurologic: intact Heart: regular rate and rhythm Lungs: diminished breath sounds bibasilar and L>R and wheezes bilaterally Wound: clean and dry  Lab Results: Recent Labs    12/17/18 0224 12/18/18 0255  WBC 15.1* 12.6*  HGB 9.1* 9.3*  HCT 29.7* 29.3*  PLT 242 232   BMET:  Recent Labs    12/17/18 0224 12/18/18 0255  NA 138 136  K 4.3 4.6  CL 103 101  CO2 28 30  GLUCOSE 114* 121*  BUN 6* <5*  CREATININE 0.38* 0.43*  CALCIUM 8.1* 8.5*    PT/INR: No results for input(s): LABPROT, INR in the last 72 hours. ABG    Component Value Date/Time   PHART 7.350 12/16/2018 0455   HCO3 29.2 (H) 12/16/2018 0455   TCO2 33 (H) 12/15/2018 1028   O2SAT 98.6 12/16/2018 0455   CBG (last 3)  Recent Labs    12/17/18 0740 12/17/18 1148 12/17/18 1607  GLUCAP 111* 112* 118*    Assessment/Plan: S/P Procedure(s) (LRB): VIDEO ASSISTED THORACOSCOPY (VATS)/LEFT UPPER LOBECTOMY (Left) NODE DISSECTION LEFT LUNG (Left) -increased O2 requirement overnight and more atelectasis on CXR. Pulling < 400 ml with IS Needs to increase mobilization! IS and flutter 10x/ hr Wean O2 as tolerated   LOS: 3 days    [place] Nakayama 12/18/2018

## 2018-12-18 NOTE — Progress Notes (Signed)
SATURATION QUALIFICATIONS: (This note is used to comply with regulatory documentation for home oxygen)  Patient Saturations on Room Air at Rest = 87%  Patient Saturations on Room Air while Ambulating = N/A due to low SpO2 at rest on room air  Patient Saturations on 2 Liters of oxygen while Ambulating = 93%  Please briefly explain why patient needs home oxygen: Unable to maintain adequate O2 on Room air at rest.  [place] Pager [phone] Office [phone]

## 2018-12-18 NOTE — Progress Notes (Signed)
Physical Therapy Evaluation Patient Details Name: Laura Abbott MRN: [id number] DOB: [birth year] Today's Date: 12/18/2018   History of Present Illness  Pt adm for lt upper lobe mass and underwent lt VATS and lt upper lobectomy. PMH -  copd, breast CA, obesity, HTN, arthritis, asthma, anxiety  Clinical Impression  Pt presents to PT with decr mobility and activity tolerance. Expect pt will make good progress and be able to return home with husband. Of Note: On room air pt with SpO2 87-88%. Amb on 2L with SpO2 93%. Pt has used O2 at home in the past but wasn't using prior to surgery. Pt states she still has O2 equipment at home.     Follow Up Recommendations Home health PT;Supervision - Intermittent    Equipment Recommendations  Other (comment)(rollator)    Recommendations for Other Services       Precautions / Restrictions Precautions Precautions: None Restrictions Weight Bearing Restrictions: No      Mobility  Bed Mobility               General bed mobility comments: Pt up in chair  Transfers Overall transfer level: Needs assistance Equipment used: None Transfers: Sit to/from Stand Sit to Stand: Supervision         General transfer comment: supervision for safety and lines  Ambulation/Gait Ambulation/Gait assistance: Min guard Gait Distance (Feet): 125 Feet Assistive device: 4-wheeled walker Gait Pattern/deviations: Step-through pattern;Decreased stride length Gait velocity: decr Gait velocity interpretation: 1.31 - 2.62 ft/sec, indicative of limited community ambulator General Gait Details: slightly unsteady gait without overt loss of balance. Amb on 2L of O2 with SpO2 93%  Stairs            Wheelchair Mobility    Modified Rankin (Stroke Patients Only)       Balance Overall balance assessment: Needs assistance Sitting-balance support: No upper extremity supported;Feet supported Sitting balance-Leahy Scale: Good     Standing balance support: No  upper extremity supported;During functional activity Standing balance-Leahy Scale: Fair                               Pertinent Vitals/Pain Pain Assessment: Faces Faces Pain Scale: Hurts a little bit Pain Location: surgical incision Pain Descriptors / Indicators: Operative site guarding Pain Intervention(s): Monitored during session    Home Living Family/patient expects to be discharged to:: Private residence Living Arrangements: Spouse/significant other Available Help at Discharge: Family;Available PRN/intermittently Type of Home: House Home Access: Stairs to enter   [employer] of Steps: 1 Home Layout: One level Home Equipment: Cane - single point      Prior Function Level of Independence: Independent with assistive device(s)         Comments: Uses cane at times when out     Hand Dominance        Extremity/Trunk Assessment   Upper Extremity Assessment Upper Extremity Assessment: Generalized weakness    Lower Extremity Assessment Lower Extremity Assessment: Generalized weakness       Communication   Communication: No difficulties  Cognition Arousal/Alertness: Awake/alert Behavior During Therapy: WFL for tasks assessed/performed Overall Cognitive Status: Within Functional Limits for tasks assessed                                        General Comments      Exercises     Assessment/Plan  PT Assessment Patient needs continued PT services  PT Problem List Decreased strength;Decreased activity tolerance;Decreased balance;Decreased mobility;Decreased knowledge of use of DME;Obesity       PT Treatment Interventions DME instruction;Gait training;Functional mobility training;Therapeutic activities;Therapeutic exercise;Balance training;Patient/family education    PT Goals (Current goals can be found in the Care Plan section)  Acute Rehab PT Goals Patient Stated Goal: return home PT Goal Formulation: With  patient Time For Goal Achievement: 12/25/18 Potential to Achieve Goals: Good    Frequency Min 3X/week   Barriers to discharge        Co-evaluation               AM-PAC PT "6 Clicks" Mobility  Outcome Measure Help needed turning from your back to your side while in a flat bed without using bedrails?: A Little Help needed moving from lying on your back to sitting on the side of a flat bed without using bedrails?: A Little Help needed moving to and from a bed to a chair (including a wheelchair)?: A Little Help needed standing up from a chair using your arms (e.g., wheelchair or bedside chair)?: A Little Help needed to walk in hospital room?: A Little Help needed climbing 3-5 steps with a railing? : A Little 6 Click Score: 18    End of Session Equipment Utilized During Treatment: Oxygen Activity Tolerance: Patient limited by fatigue Patient left: in chair;with call bell/phone within reach;with family/visitor present Nurse Communication: Mobility status PT Visit Diagnosis: Unsteadiness on feet (R26.81);Muscle weakness (generalized) (M62.81)    Time: 1205-1220 PT Time Calculation (min) (ACUTE ONLY): 15 min   Charges:   PT Evaluation $PT Eval Moderate Complexity: [place] Pager [phone] Office [place] 12/18/2018, 2:21 PM

## 2018-12-18 NOTE — Care Management Important Message (Signed)
Important Message  Patient Details  Name: Laura Abbott MRN: [id number] Date of Birth: [birth year]   Medicare Important Message Given:  Yes    Karrina Lye P [place] 12/18/2018, 11:08 AM

## 2018-12-19 ENCOUNTER — Inpatient Hospital Stay (HOSPITAL_COMMUNITY): Payer: Medicare HMO

## 2018-12-19 MED ORDER — TRAMADOL HCL 50 MG PO TABS
50.0000 mg | ORAL_TABLET | Freq: Four times a day (QID) | ORAL | Status: DC | PRN
  Administered 2018-12-19: 50 mg via ORAL
  Filled 2018-12-19: qty 1

## 2018-12-19 NOTE — Care Management Note (Addendum)
Case Management Note  Patient Details  Name: Laura Abbott MRN: [id number] Date of Birth: 1945-12-03   Subjective/Objective:    Pt is s/p VATS               Action/Plan:   PTA independent from home.  Pt will discharge home on oxygen - DME company chosen was [facility] accepted referral.  Pt is already active with [facility] for RN - CM shared medicare.gov quality scores with pt and pt would like to remain with agency.      Expected Discharge Date:                  Expected Discharge Plan:  [place]  In-House Referral:     Discharge planning Services  CM Consult  Post Acute Care Choice:    Choice offered to:  Patient  DME Arranged:  Oxygen, Walker rolling with seat DME Agency:  [place]:  RN, PT [facility] Agency:  [place]  Status of Service:  In process, will continue to follow  If discussed at Long Length of Stay Meetings, dates discussed:    Additional Comments:  Maryclare Labrador, RN 12/19/2018, 4:22 PM

## 2018-12-19 NOTE — Clinical Social Work Note (Signed)
CSW acknowledges consult, "Laura Abbott needs home health physical therapy 3 x per week per inpatient PT recommendations. She also needs home O2 therapy at 2L/min nasal cannula (she already has O2 and a pulse oximeter at home)." Notified RNCM.  CSW signing off. Consult again if any social work needs arise.  [place] Scrape, [place]

## 2018-12-19 NOTE — Progress Notes (Signed)
Physical Therapy Treatment Patient Details Name: Laura Abbott MRN: [id number] DOB: October 16, 1946 Today's Date: 12/19/2018    History of Present Illness Pt adm for lt upper lobe mass and underwent lt VATS and lt upper lobectomy. PMH -  copd, breast CA, obesity, HTN, arthritis, asthma, anxiety    PT Comments    Pt admitted with above diagnosis. Pt currently with functional limitations due to balance and endurance deficits. Pt was able to ambulate with rollator with good steady gait overall. Took 1 seated rest break using rollator correctly.  Desats on RA and needs 2LO2 to keep sats >90%.   Pt will benefit from skilled PT to increase their independence and safety with mobility to allow discharge to the venue listed below.    SATURATION QUALIFICATIONS: (This note is used to comply with regulatory documentation for home oxygen)  Patient Saturations on Room Air at Rest = 86%  Patient Saturations on Room Air while Ambulating = Not tested as desat on RA.  Patient Saturations on 2 Liters of oxygen while Ambulating = 91%  Please briefly explain why patient needs home oxygen:Pt requiring 2LO2 to keep sats >90%.  Follow Up Recommendations  Home health PT;Supervision - Intermittent     Equipment Recommendations  Other (comment)(rollator)    Recommendations for Other Services       Precautions / Restrictions Precautions Precautions: None Restrictions Weight Bearing Restrictions: No    Mobility  Bed Mobility Overal bed mobility: Needs Assistance Bed Mobility: Supine to Sit     Supine to sit: Min assist     General bed mobility comments: Needed a hand to pull up to siotting.  Incr time to come to EOB but pt did herself. Got one sock on but couldn't get the 2 nd sock on.   Transfers Overall transfer level: Needs assistance Equipment used: None Transfers: Sit to/from Stand Sit to Stand: Supervision         General transfer comment: supervision for safety and  lines  Ambulation/Gait Ambulation/Gait assistance: Min guard Gait Distance (Feet): 160 Feet(80 feet x 2) Assistive device: 4-wheeled walker Gait Pattern/deviations: Step-through pattern;Decreased stride length;Wide base of support Gait velocity: decr Gait velocity interpretation: 1.31 - 2.62 ft/sec, indicative of limited community ambulator General Gait Details: steady gait with rollator. Amb on 2L of O2 with SpO2 93%, desat to 86% on RA at rest without O2. Pt needed 1 sitting rest break.  Locked brakes appropriately.    Stairs             Wheelchair Mobility    Modified Rankin (Stroke Patients Only)       Balance Overall balance assessment: Needs assistance Sitting-balance support: No upper extremity supported;Feet supported Sitting balance-Leahy Scale: Good     Standing balance support: During functional activity;Bilateral upper extremity supported Standing balance-Leahy Scale: Poor Standing balance comment: Relies on UE support for balance with rollator                             Cognition Arousal/Alertness: Awake/alert Behavior During Therapy: WFL for tasks assessed/performed Overall Cognitive Status: Within Functional Limits for tasks assessed                                        Exercises General Exercises - Lower Extremity Long Arc Quad: AROM;Both;10 reps;Seated    General Comments  Pertinent Vitals/Pain Pain Assessment: Faces Faces Pain Scale: Hurts even more Pain Location: surgical incision Pain Descriptors / Indicators: Operative site guarding Pain Intervention(s): Limited activity within patient's tolerance;Monitored during session;Repositioned    Home Living                      Prior Function            PT Goals (current goals can now be found in the care plan section) Acute Rehab PT Goals Patient Stated Goal: return home Progress towards PT goals: Progressing toward goals     Frequency    Min 3X/week      PT Plan Current plan remains appropriate    Co-evaluation              AM-PAC PT "6 Clicks" Mobility   Outcome Measure  Help needed turning from your back to your side while in a flat bed without using bedrails?: A Little Help needed moving from lying on your back to sitting on the side of a flat bed without using bedrails?: A Little Help needed moving to and from a bed to a chair (including a wheelchair)?: A Little Help needed standing up from a chair using your arms (e.g., wheelchair or bedside chair)?: A Little Help needed to walk in hospital room?: A Little Help needed climbing 3-5 steps with a railing? : A Little 6 Click Score: 18    End of Session Equipment Utilized During Treatment: Oxygen;Gait belt(2LO2) Activity Tolerance: Patient limited by fatigue Patient left: in chair;with call bell/phone within reach;with family/visitor present Nurse Communication: Mobility status PT Visit Diagnosis: Unsteadiness on feet (R26.81);Muscle weakness (generalized) (M62.81)     Time: 2706-2376 PT Time Calculation (min) (ACUTE ONLY): 26 min  Charges:  $Gait Training: 8-22 mins $Therapeutic Exercise: 8-22 mins                     [place] Pager:  [phone]  Office:  [place] 12/19/2018, 12:54 PM

## 2018-12-19 NOTE — Progress Notes (Signed)
SATURATION QUALIFICATIONS: (This note is used to comply with regulatory documentation for home oxygen)  Patient Saturations on Room Air at Rest = 86%  Patient Saturations on Room Air while Ambulating = Not tested as desat on RA.  Patient Saturations on 2 Liters of oxygen while Ambulating = 91%  Please briefly explain why patient needs home oxygen:Pt requiring 2LO2 to keep sats >90%. [place] Pager:  [phone]  Office:  [phone]

## 2018-12-19 NOTE — Progress Notes (Addendum)
4 Days Post-Op Procedure(s) (LRB): VIDEO ASSISTED THORACOSCOPY (VATS)/LEFT UPPER LOBECTOMY (Left) NODE DISSECTION LEFT LUNG (Left) Subjective: Awake and alert.  Sitting up in bedside chair.  She complains of feeling confused and "foggy" at times.  She walked in the hall a few times yesterday and again this morning.  Oxygen desaturation with ambulation was documented by the physical therapy team.  Objective: Vital signs in last 24 hours: Temp:  [98 F (36.7 C)-99.1 F (37.3 C)] 98.4 F (36.9 C) (01/24 0315) Pulse Rate:  [64-77] 67 (01/24 0500) Cardiac Rhythm: Normal sinus rhythm (01/24 0700) Resp:  [12-20] 16 (01/24 0500) BP: (103-136)/(47-99) 121/51 (01/24 0400) SpO2:  [91 %-98 %] 96 % (01/24 0500)      Intake/Output from previous day: 01/23 0701 - 01/24 0700 In: 350 [P.O.:350] Out: -  Intake/Output this shift: No intake/output data recorded.  General appearance: alert, cooperative and no distress Heart: regular rate and rhythm Lungs: Few wheezes in the bases, otherwise clear.  Chest x-ray continues to show some bibasilar atelectasis and a small right pleural effusion.  There is a small left apical pneumothorax that is stable compared with 2 days ago. Wound: Thoracotomy incision and chest tube insertion site are covered with dry dressings.   O2 saturation is 98% on 2L/[place].  Lab Results: Recent Labs    12/17/18 0224 12/18/18 0255  WBC 15.1* 12.6*  HGB 9.1* 9.3*  HCT 29.7* 29.3*  PLT 242 232   BMET:  Recent Labs    12/17/18 0224 12/18/18 0255  NA 138 136  K 4.3 4.6  CL 103 101  CO2 28 30  GLUCOSE 114* 121*  BUN 6* <5*  CREATININE 0.38* 0.43*  CALCIUM 8.1* 8.5*    PT/INR: No results for input(s): LABPROT, INR in the last 72 hours. ABG    Component Value Date/Time   PHART 7.350 12/16/2018 0455   HCO3 29.2 (H) 12/16/2018 0455   TCO2 33 (H) 12/15/2018 1028   O2SAT 98.6 12/16/2018 0455   CBG (last 3)  Recent Labs    12/17/18 1148 12/17/18 1607  12/18/18 0754  GLUCAP 112* 118* 126*    Assessment/Plan: S/P Procedure(s) (LRB): VIDEO ASSISTED THORACOSCOPY (VATS)/LEFT UPPER LOBECTOMY (Left) NODE DISSECTION LEFT LUNG (Left)  1.  Postop day 4 left upper lobectomy and mediastinal lymph node sampling for suspected lung cancer.  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  She is making slow progress with mobility and pulmonary hygeine and had a better day yesterday.  Chest x-ray shows good expansion of the left lung with a tiny left apical pneumothorax, continued basilar ATX and small left effusion but overall improved.   2.  Pulm.    Respiratory effort is improving.  Her cough is less coarse. Documentation of hypoxia on RA with ambulation by PT noted. She has home O2 and pulse Ox equipment at home.   Encouraged continued use of the incentive spirometer and flutter valve.  3.  Hypokalemia: Resolved 4.  DVT  PPX-continue subcutaneous Lovenox 5.  HTN--BP acceptable.  Continue her current dose of lisinopril and metoprolol 6.  Confusion-- Suspect from accumulation of narcotic. Will try switching to tramadol.   *Arranging for home physical therapy 3x weekly  per inpatient PT recommendations.   LOS: 4 days    Antony Odea, [place] [phone] 12/19/2018 Patient seen and examined, agree with above Looks much better today. More alert this afternoon Path - spindle cell tumor, nodes negative and  margins clear- tumor sent out for genetic testing to determine origin Possibly home this weekend if continues to progress May need home O2 short term  Remo Lipps C. Roxan Hockey, MD [facility] [phone]

## 2018-12-20 DIAGNOSIS — C3490 Malignant neoplasm of unspecified part of unspecified bronchus or lung: Secondary | ICD-10-CM

## 2018-12-20 MED ORDER — ACETAMINOPHEN 500 MG PO TABS: 1000.0000 mg | ORAL_TABLET | Freq: Four times a day (QID) | ORAL | 0 refills | Status: DC | PRN

## 2018-12-20 MED ORDER — OXYCODONE HCL 5 MG PO TABS: 5.0000 mg | ORAL_TABLET | ORAL | 0 refills | Status: DC | PRN

## 2018-12-20 NOTE — Care Management (Signed)
Pt has O2 with [facility].  [facility] will deliver portable tank prior to d/c. All other arrangements complete.

## 2018-12-20 NOTE — Discharge Instructions (Signed)
Discharge Instructions:  1. You may shower, please wash incisions daily with soap and water and keep dry.  If you wish to cover wounds with dressing you may do so but please keep clean and change daily.  No tub baths or swimming until incisions have completely healed.  If your incisions become red or develop any drainage please call our office at [phone]  2. No Driving until cleared by Hendrickson's office and you are no longer using narcotic pain medications  3. Fever of 101.5 for at least 24 hours with no source, please contact our office at [phone]  4. Activity- up as tolerated, please walk at least 3 times per day.  Avoid strenuous activity  5. If any questions or concerns arise, please do not hesitate to contact our office at [phone]

## 2018-12-20 NOTE — Progress Notes (Signed)
      [address]       [place] [address]             [phone]      5 Days Post-Op Procedure(s) (LRB): VIDEO ASSISTED THORACOSCOPY (VATS)/LEFT UPPER LOBECTOMY (Left) NODE DISSECTION LEFT LUNG (Left)   Subjective:  No new complaints.  She is feeling better today.  Just got cleaned up sitting on side of bed.  She feels ready to go home today.  Objective: Vital signs in last 24 hours: Temp:  [98.3 F (36.8 C)-98.8 F (37.1 C)] 98.6 F (37 C) (01/25 0335) Pulse Rate:  [67-89] 78 (01/25 0335) Cardiac Rhythm: Normal sinus rhythm (01/25 0700) Resp:  [16-24] 24 (01/25 0335) BP: (107-159)/(52-71) 149/53 (01/25 0335) SpO2:  [94 %-95 %] 95 % (01/25 0335)  Intake/Output from previous day: 01/24 0701 - 01/25 0700 In: 240 [P.O.:240] Out: -   General appearance: alert, cooperative and no distress Heart: regular rate and rhythm Lungs: diminished breath sounds bibasilar Abdomen: soft, non-tender; bowel sounds normal; no masses,  no organomegaly Extremities: extremities normal, atraumatic, no cyanosis or edema Wound: clean and dry  Lab Results: Recent Labs    12/18/18 0255  WBC 12.6*  HGB 9.3*  HCT 29.3*  PLT 232   BMET:  Recent Labs    12/18/18 0255  NA 136  K 4.6  CL 101  CO2 30  GLUCOSE 121*  BUN <5*  CREATININE 0.43*  CALCIUM 8.5*    PT/INR: No results for input(s): LABPROT, INR in the last 72 hours. ABG    Component Value Date/Time   PHART 7.350 12/16/2018 0455   HCO3 29.2 (H) 12/16/2018 0455   TCO2 33 (H) 12/15/2018 1028   O2SAT 98.6 12/16/2018 0455   CBG (last 3)  Recent Labs    12/17/18 1148 12/17/18 1607 12/18/18 0754  GLUCAP 112* 118* 126*    Assessment/Plan: S/P Procedure(s) (LRB): VIDEO ASSISTED THORACOSCOPY (VATS)/LEFT UPPER LOBECTOMY (Left) NODE DISSECTION LEFT LUNG (Left)  1. CV-hemodynamically stable on home Lisinopril, Lopressor 2. Pulm- on oxygen at home, continued use has been arranged, stressed importance of  continued use of IS at discharge 3. Deconditioning- home PT has been arranged 4. Dispo- patient stable, she is alert and oriented today, ambulating with assistance, feeling much better, will d/c home today   LOS: 5 days    Ellwood Handler 12/20/2018

## 2018-12-20 NOTE — Progress Notes (Signed)
Patient is ready for discharge. She is alert and oriented and vital signs are stable. She has all of her belongings with her. IV has been removed and patient has been taken off telemetry, CCMD notified. I have reviewed all discharge instructions with her and all questions have been answered. She will be transported home by her granddaughter who is currently here with her. She will leave unit by wheelchair and meet granddaughter at the front entrance of the hospital.

## 2018-12-21 DIAGNOSIS — I10 Essential (primary) hypertension: Secondary | ICD-10-CM | POA: Diagnosis not present

## 2018-12-21 DIAGNOSIS — J441 Chronic obstructive pulmonary disease with (acute) exacerbation: Secondary | ICD-10-CM | POA: Diagnosis not present

## 2018-12-21 DIAGNOSIS — F329 Major depressive disorder, single episode, unspecified: Secondary | ICD-10-CM | POA: Diagnosis not present

## 2018-12-21 DIAGNOSIS — F419 Anxiety disorder, unspecified: Secondary | ICD-10-CM | POA: Diagnosis not present

## 2018-12-21 DIAGNOSIS — M179 Osteoarthritis of knee, unspecified: Secondary | ICD-10-CM | POA: Diagnosis not present

## 2018-12-21 DIAGNOSIS — J209 Acute bronchitis, unspecified: Secondary | ICD-10-CM | POA: Diagnosis not present

## 2018-12-21 DIAGNOSIS — M810 Age-related osteoporosis without current pathological fracture: Secondary | ICD-10-CM | POA: Diagnosis not present

## 2018-12-21 DIAGNOSIS — R918 Other nonspecific abnormal finding of lung field: Secondary | ICD-10-CM | POA: Diagnosis not present

## 2018-12-21 DIAGNOSIS — J440 Chronic obstructive pulmonary disease with acute lower respiratory infection: Secondary | ICD-10-CM | POA: Diagnosis not present

## 2018-12-22 DIAGNOSIS — J441 Chronic obstructive pulmonary disease with (acute) exacerbation: Secondary | ICD-10-CM | POA: Diagnosis not present

## 2018-12-22 DIAGNOSIS — I10 Essential (primary) hypertension: Secondary | ICD-10-CM | POA: Diagnosis not present

## 2018-12-22 DIAGNOSIS — J209 Acute bronchitis, unspecified: Secondary | ICD-10-CM | POA: Diagnosis not present

## 2018-12-22 DIAGNOSIS — F419 Anxiety disorder, unspecified: Secondary | ICD-10-CM | POA: Diagnosis not present

## 2018-12-22 DIAGNOSIS — F329 Major depressive disorder, single episode, unspecified: Secondary | ICD-10-CM | POA: Diagnosis not present

## 2018-12-22 DIAGNOSIS — J440 Chronic obstructive pulmonary disease with acute lower respiratory infection: Secondary | ICD-10-CM | POA: Diagnosis not present

## 2018-12-22 DIAGNOSIS — M810 Age-related osteoporosis without current pathological fracture: Secondary | ICD-10-CM | POA: Diagnosis not present

## 2018-12-22 DIAGNOSIS — M179 Osteoarthritis of knee, unspecified: Secondary | ICD-10-CM | POA: Diagnosis not present

## 2018-12-22 DIAGNOSIS — R918 Other nonspecific abnormal finding of lung field: Secondary | ICD-10-CM | POA: Diagnosis not present

## 2018-12-24 ENCOUNTER — Encounter: Payer: Self-pay | Admitting: Genetic Counselor

## 2018-12-24 ENCOUNTER — Telehealth: Payer: Self-pay | Admitting: Genetic Counselor

## 2018-12-24 ENCOUNTER — Ambulatory Visit: Payer: Self-pay | Admitting: Genetic Counselor

## 2018-12-24 DIAGNOSIS — Z299 Encounter for prophylactic measures, unspecified: Secondary | ICD-10-CM | POA: Diagnosis not present

## 2018-12-24 DIAGNOSIS — Z87891 Personal history of nicotine dependence: Secondary | ICD-10-CM | POA: Diagnosis not present

## 2018-12-24 DIAGNOSIS — R918 Other nonspecific abnormal finding of lung field: Secondary | ICD-10-CM | POA: Diagnosis not present

## 2018-12-24 DIAGNOSIS — I10 Essential (primary) hypertension: Secondary | ICD-10-CM | POA: Diagnosis not present

## 2018-12-24 DIAGNOSIS — F329 Major depressive disorder, single episode, unspecified: Secondary | ICD-10-CM | POA: Diagnosis not present

## 2018-12-24 DIAGNOSIS — J441 Chronic obstructive pulmonary disease with (acute) exacerbation: Secondary | ICD-10-CM | POA: Diagnosis not present

## 2018-12-24 DIAGNOSIS — E7800 Pure hypercholesterolemia, unspecified: Secondary | ICD-10-CM | POA: Diagnosis not present

## 2018-12-24 DIAGNOSIS — F419 Anxiety disorder, unspecified: Secondary | ICD-10-CM | POA: Diagnosis not present

## 2018-12-24 DIAGNOSIS — Z1379 Encounter for other screening for genetic and chromosomal anomalies: Secondary | ICD-10-CM | POA: Insufficient documentation

## 2018-12-24 DIAGNOSIS — Z6838 Body mass index (BMI) 38.0-38.9, adult: Secondary | ICD-10-CM | POA: Diagnosis not present

## 2018-12-24 DIAGNOSIS — J440 Chronic obstructive pulmonary disease with acute lower respiratory infection: Secondary | ICD-10-CM | POA: Diagnosis not present

## 2018-12-24 DIAGNOSIS — J449 Chronic obstructive pulmonary disease, unspecified: Secondary | ICD-10-CM | POA: Diagnosis not present

## 2018-12-24 DIAGNOSIS — M810 Age-related osteoporosis without current pathological fracture: Secondary | ICD-10-CM | POA: Diagnosis not present

## 2018-12-24 DIAGNOSIS — C3490 Malignant neoplasm of unspecified part of unspecified bronchus or lung: Secondary | ICD-10-CM

## 2018-12-24 DIAGNOSIS — J209 Acute bronchitis, unspecified: Secondary | ICD-10-CM | POA: Diagnosis not present

## 2018-12-24 DIAGNOSIS — M179 Osteoarthritis of knee, unspecified: Secondary | ICD-10-CM | POA: Diagnosis not present

## 2018-12-24 DIAGNOSIS — D0510 Intraductal carcinoma in situ of unspecified breast: Secondary | ICD-10-CM

## 2018-12-24 NOTE — Progress Notes (Signed)
HPI:  Ms. Betzler was previously seen in the [facility] due to a personal and family history of cancer and concerns regarding a hereditary predisposition to cancer. Please refer to our prior cancer genetics clinic note for more information regarding Ms. Saintil's medical, social and family histories, and our assessment and recommendations, at the time. Ms. Gullickson recent genetic test results were disclosed to her, as were recommendations warranted by these results. These results and recommendations are discussed in more detail below.  CANCER HISTORY:    DCIS (ductal carcinoma in situ) of breast   10/28/2013 Initial Diagnosis    DCIS (ductal carcinoma in situ) of breast    12/21/2018 Genetic Testing    Negative genetic testing on the multicancer panel.  The Multi-Gene Panel offered by [facility] includes sequencing and/or deletion duplication testing of the following 84 genes: AIP, ALK, APC, ATM, AXIN2,BAP1,  BARD1, BLM, BMPR1A, BRCA1, BRCA2, BRIP1, CASR, CDC73, CDH1, CDK4, CDKN1B, CDKN1C, CDKN2A (p14ARF), CDKN2A (p16INK4a), CEBPA, CHEK2, CTNNA1, DICER1, DIS3L2, EGFR (c.2369C>T, p.Thr790Met variant only), EPCAM (Deletion/duplication testing only), FH, FLCN, GATA2, GPC3, GREM1 (Promoter region deletion/duplication testing only), HOXB13 (c.251G>A, p.Gly84Glu), HRAS, KIT, MAX, MEN1, MET, MITF (c.952G>A, p.Glu318Lys variant only), MLH1, MSH2, MSH3, MSH6, MUTYH, NBN, NF1, NF2, NTHL1, PALB2, PDGFRA, PHOX2B, PMS2, POLD1, POLE, POT1, PRKAR1A, PTCH1, PTEN, RAD50, RAD51C, RAD51D, RB1, RECQL4, RET, RUNX1, SDHAF2, SDHA (sequence changes only), SDHB, SDHC, SDHD, SMAD4, SMARCA4, SMARCB1, SMARCE1, STK11, SUFU, TERC, TERT, TMEM127, TP53, TSC1, TSC2, VHL, WRN and WT1.  The report date is 12/21/2018.     Lung cancer ([place])   12/15/2018 Initial Diagnosis    Lung cancer ([place])    12/21/2018 Genetic Testing    Negative genetic testing on the multicancer panel.  The Multi-Gene Panel offered by [facility] includes  sequencing and/or deletion duplication testing of the following 84 genes: AIP, ALK, APC, ATM, AXIN2,BAP1,  BARD1, BLM, BMPR1A, BRCA1, BRCA2, BRIP1, CASR, CDC73, CDH1, CDK4, CDKN1B, CDKN1C, CDKN2A (p14ARF), CDKN2A (p16INK4a), CEBPA, CHEK2, CTNNA1, DICER1, DIS3L2, EGFR (c.2369C>T, p.Thr790Met variant only), EPCAM (Deletion/duplication testing only), FH, FLCN, GATA2, GPC3, GREM1 (Promoter region deletion/duplication testing only), HOXB13 (c.251G>A, p.Gly84Glu), HRAS, KIT, MAX, MEN1, MET, MITF (c.952G>A, p.Glu318Lys variant only), MLH1, MSH2, MSH3, MSH6, MUTYH, NBN, NF1, NF2, NTHL1, PALB2, PDGFRA, PHOX2B, PMS2, POLD1, POLE, POT1, PRKAR1A, PTCH1, PTEN, RAD50, RAD51C, RAD51D, RB1, RECQL4, RET, RUNX1, SDHAF2, SDHA (sequence changes only), SDHB, SDHC, SDHD, SMAD4, SMARCA4, SMARCB1, SMARCE1, STK11, SUFU, TERC, TERT, TMEM127, TP53, TSC1, TSC2, VHL, WRN and WT1.  The report date is 12/21/2018.     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  The Multi-Gene Panel offered by [facility] includes sequencing and/or deletion duplication testing of the following 84 genes: AIP, ALK, APC, ATM, AXIN2,BAP1,  BARD1, BLM, BMPR1A, BRCA1, BRCA2, BRIP1, CASR, CDC73, CDH1, CDK4, CDKN1B, CDKN1C, CDKN2A (p14ARF), CDKN2A (p16INK4a), CEBPA, CHEK2, CTNNA1, DICER1, DIS3L2, EGFR (c.2369C>T, p.Thr790Met variant only), EPCAM (Deletion/duplication testing only), FH, FLCN, GATA2, GPC3, GREM1 (Promoter region deletion/duplication testing only), HOXB13 (c.251G>A, p.Gly84Glu), HRAS, KIT, MAX, MEN1, MET, MITF (c.952G>A, p.Glu318Lys variant only), MLH1, MSH2, MSH3, MSH6, MUTYH, NBN, NF1, NF2, NTHL1, PALB2, PDGFRA, PHOX2B, PMS2, POLD1, POLE, POT1, PRKAR1A, PTCH1, PTEN, RAD50, RAD51C, RAD51D, RB1, RECQL4, RET, RUNX1, SDHAF2, SDHA (sequence changes only), SDHB, SDHC, SDHD, SMAD4, SMARCA4, SMARCB1,  SMARCE1, STK11, SUFU, TERC, TERT, TMEM127, TP53, TSC1, TSC2, VHL, WRN and  WT1.  The report date is 12/21/2018.     FAMILY HISTORY:  We obtained a detailed, 4-generation family history.  Significant diagnoses are listed below: Family History  Problem Relation Age of Onset  . Lung cancer Mother 67       non smoker  . Pancreatic cancer Father 102       d. 15  . Leukemia Sister        dx in her 50s  . Heart disease Sister   . Colon cancer Sister        dx and died in her 18s  . Heart disease Sister   . Lung cancer Maternal Aunt        non-smoker  . Heart disease Sister   . Breast cancer Sister        dx early 29s  . Heart disease Brother   . Cirrhosis Brother        d. 36  . Aneurysm Brother        d. 56s  . Fibromyalgia Daughter   . Kidney cancer Nephew 71  . Kidney cancer Nephew        dx in 26s; great nephew  . Leukemia Nephew   . Liver disease Nephew   . Other Niece        white matter on brain; dx late 21s    The patient has two daughters and one son who are cancer free.  She has five sisters and five brothers.  One sister was diagnosed with colon cancer in her 45's-60's, a sister was diagnosed with leukemia in her 9's, and a third sister was diagnosed with breast cancer in her early 64's.  One brother died at 66 from liver cirrhosis and he had a son with leukemia.  Another brother died with AAA.  One sister has a son who was diagnosed with kidney cancer, and this son had a son who died of kidney cancer.  Both parents are deceased.  The patient's mother was a non-smoker and died of lung cancer.  She had five brothers and a sister.  Her sister who was a non-smoker died of lung cancer.  The maternal grandparents are deceased from non cancer related issues.  The patient's father died of pancreatic cancer.  He had a sister and brother who were cancer free.  He reportedly had several paternal half brothers, but nothing is known about them.  The paternal grandparents are  deceased.  Ms. Partin is unaware of previous family history of genetic testing for hereditary cancer risks. Patient's maternal ancestors are of Caucasian descent, and paternal ancestors are of Caucasian descent. There is no reported Ashkenazi Jewish ancestry. There is no known consanguinity.    GENETIC TEST RESULTS: Genetic testing reported out on December 21, 2018 through the multi cancer cancer panel found no deleterious mutations.  The Multi-Gene Panel offered by [facility] includes sequencing and/or deletion duplication testing of the following 84 genes: AIP, ALK, APC, ATM, AXIN2,BAP1,  BARD1, BLM, BMPR1A, BRCA1, BRCA2, BRIP1, CASR, CDC73, CDH1, CDK4, CDKN1B, CDKN1C, CDKN2A (p14ARF), CDKN2A (p16INK4a), CEBPA, CHEK2, CTNNA1, DICER1, DIS3L2, EGFR (c.2369C>T, p.Thr790Met variant only), EPCAM (Deletion/duplication testing only), FH, FLCN, GATA2, GPC3, GREM1 (Promoter region deletion/duplication testing only), HOXB13 (c.251G>A, p.Gly84Glu), HRAS, KIT, MAX, MEN1, MET, MITF (c.952G>A, p.Glu318Lys variant only), MLH1, MSH2, MSH3, MSH6, MUTYH, NBN, NF1, NF2, NTHL1, PALB2, PDGFRA, PHOX2B, PMS2, POLD1, POLE, POT1, PRKAR1A, PTCH1, PTEN, RAD50, RAD51C, RAD51D, RB1, RECQL4, RET, RUNX1, SDHAF2, SDHA (sequence changes only), SDHB, SDHC, SDHD, SMAD4, SMARCA4, SMARCB1,  SMARCE1, STK11, SUFU, TERC, TERT, TMEM127, TP53, TSC1, TSC2, VHL, WRN and WT1.   The test report has been scanned into EPIC and is located under the Molecular Pathology section of the Results Review tab.    We discussed with Ms. Lewis that since the current genetic testing is not perfect, it is possible there may be a gene mutation in one of these genes that current testing cannot detect, but that chance is small.  We also discussed, that it is possible that another gene that has not yet been discovered, or that we have not yet tested, is responsible for the cancer diagnoses in the family, and it is, therefore, important to remain in touch with cancer genetics in  the future so that we can continue to offer Ms. Mckeag the most up to date genetic testing.   CANCER SCREENING RECOMMENDATIONS:  This result is reassuring and indicates that Ms. Folse likely does not have an increased risk for a future cancer due to a mutation in one of these genes. This normal test also suggests that Ms. Kinnard's cancer was most likely not due to an inherited predisposition associated with one of these genes.  Most cancers happen by chance and this negative test suggests that her cancer falls into this category.  We, therefore, recommended she continue to follow the cancer management and screening guidelines provided by her oncology and primary healthcare provider.   An individual's cancer risk and medical management are not determined by genetic test results alone. Overall cancer risk assessment incorporates additional factors, including personal medical history, family history, and any available genetic information that may result in a personalized plan for cancer prevention and surveillance.  RECOMMENDATIONS FOR FAMILY MEMBERS:  Women in this family might be at some increased risk of developing cancer, over the general population risk, simply due to the family history of cancer.  We recommended women in this family have a yearly mammogram beginning at age 46, or 44 years younger than the earliest onset of cancer, an annual clinical breast exam, and perform monthly breast self-exams. Women in this family should also have a gynecological exam as recommended by their primary provider. All family members should have a colonoscopy by age 58.  FOLLOW-UP: Lastly, we discussed with Ms. Engelstad that cancer genetics is a rapidly advancing field and it is possible that new genetic tests will be appropriate for her and/or her family members in the future. We encouraged her to remain in contact with cancer genetics on an annual basis so we can update her personal and family histories and let her know of  advances in cancer genetics that may benefit this family.   Our contact number was provided. Ms. Schooley questions were answered to her satisfaction, and she knows she is welcome to call us at anytime with additional questions or concerns.   Roma Kayser, MS, [facility] Certified Genetic Counselor [email or web address]

## 2018-12-24 NOTE — Telephone Encounter (Signed)
Revealed negative genetic testing.  Discussed that we do not know why she has breast cancer or why there is cancer in the family. It could be due to a different gene that we are not testing, or maybe our current technology may not be able to pick something up.  It will be important for her to keep in contact with genetics to keep up with whether additional testing may be needed. 

## 2018-12-25 ENCOUNTER — Other Ambulatory Visit: Payer: Self-pay | Admitting: *Deleted

## 2018-12-25 DIAGNOSIS — F329 Major depressive disorder, single episode, unspecified: Secondary | ICD-10-CM | POA: Diagnosis not present

## 2018-12-25 DIAGNOSIS — J440 Chronic obstructive pulmonary disease with acute lower respiratory infection: Secondary | ICD-10-CM | POA: Diagnosis not present

## 2018-12-25 DIAGNOSIS — M179 Osteoarthritis of knee, unspecified: Secondary | ICD-10-CM | POA: Diagnosis not present

## 2018-12-25 DIAGNOSIS — M810 Age-related osteoporosis without current pathological fracture: Secondary | ICD-10-CM | POA: Diagnosis not present

## 2018-12-25 DIAGNOSIS — F419 Anxiety disorder, unspecified: Secondary | ICD-10-CM | POA: Diagnosis not present

## 2018-12-25 DIAGNOSIS — I10 Essential (primary) hypertension: Secondary | ICD-10-CM | POA: Diagnosis not present

## 2018-12-25 DIAGNOSIS — R918 Other nonspecific abnormal finding of lung field: Secondary | ICD-10-CM | POA: Diagnosis not present

## 2018-12-25 DIAGNOSIS — J441 Chronic obstructive pulmonary disease with (acute) exacerbation: Secondary | ICD-10-CM | POA: Diagnosis not present

## 2018-12-25 DIAGNOSIS — J209 Acute bronchitis, unspecified: Secondary | ICD-10-CM | POA: Diagnosis not present

## 2018-12-25 NOTE — Progress Notes (Signed)
The proposed treatment discussed in cancer conference 12/25/2018 is for discussion purpose only and is not a binding recommendation.  The patient was not physically examined nor present for their treatment options.  Therefore, final treatment plans cannot be decided.

## 2018-12-26 ENCOUNTER — Ambulatory Visit (INDEPENDENT_AMBULATORY_CARE_PROVIDER_SITE_OTHER): Payer: Self-pay

## 2018-12-26 ENCOUNTER — Ambulatory Visit (HOSPITAL_COMMUNITY): Payer: Self-pay | Admitting: Hematology

## 2018-12-26 ENCOUNTER — Other Ambulatory Visit: Payer: Self-pay

## 2018-12-26 DIAGNOSIS — Z4802 Encounter for removal of sutures: Secondary | ICD-10-CM

## 2018-12-26 DIAGNOSIS — M179 Osteoarthritis of knee, unspecified: Secondary | ICD-10-CM | POA: Diagnosis not present

## 2018-12-26 DIAGNOSIS — J209 Acute bronchitis, unspecified: Secondary | ICD-10-CM | POA: Diagnosis not present

## 2018-12-26 DIAGNOSIS — R918 Other nonspecific abnormal finding of lung field: Secondary | ICD-10-CM | POA: Diagnosis not present

## 2018-12-26 DIAGNOSIS — F419 Anxiety disorder, unspecified: Secondary | ICD-10-CM | POA: Diagnosis not present

## 2018-12-26 DIAGNOSIS — J441 Chronic obstructive pulmonary disease with (acute) exacerbation: Secondary | ICD-10-CM | POA: Diagnosis not present

## 2018-12-26 DIAGNOSIS — M810 Age-related osteoporosis without current pathological fracture: Secondary | ICD-10-CM | POA: Diagnosis not present

## 2018-12-26 DIAGNOSIS — J440 Chronic obstructive pulmonary disease with acute lower respiratory infection: Secondary | ICD-10-CM | POA: Diagnosis not present

## 2018-12-26 DIAGNOSIS — F329 Major depressive disorder, single episode, unspecified: Secondary | ICD-10-CM | POA: Diagnosis not present

## 2018-12-26 DIAGNOSIS — I10 Essential (primary) hypertension: Secondary | ICD-10-CM | POA: Diagnosis not present

## 2018-12-26 NOTE — Progress Notes (Signed)
Patient arrived for nurse visit to remove sutures post- procedure VATS/ lobectomy with Dr. Roxan Hockey 12/15/2018.  1 Suture removed with no signs/ symptoms of infection noted.  Patient tolerated procedure well.  Patient/ family instructed to keep the incision sites clean and dry.  Patient/ family acknowledged instructions given.

## 2018-12-29 ENCOUNTER — Inpatient Hospital Stay (HOSPITAL_COMMUNITY): Payer: Medicare HMO | Attending: Hematology | Admitting: Hematology

## 2018-12-29 ENCOUNTER — Encounter (HOSPITAL_COMMUNITY): Payer: Self-pay | Admitting: Hematology

## 2018-12-29 ENCOUNTER — Other Ambulatory Visit: Payer: Self-pay

## 2018-12-29 VITALS — BP 162/43 | HR 54 | Temp 98.4°F | Resp 16 | Wt 207.3 lb

## 2018-12-29 DIAGNOSIS — K30 Functional dyspepsia: Secondary | ICD-10-CM | POA: Diagnosis not present

## 2018-12-29 DIAGNOSIS — Z87891 Personal history of nicotine dependence: Secondary | ICD-10-CM

## 2018-12-29 DIAGNOSIS — J209 Acute bronchitis, unspecified: Secondary | ICD-10-CM | POA: Diagnosis not present

## 2018-12-29 DIAGNOSIS — Z86000 Personal history of in-situ neoplasm of breast: Secondary | ICD-10-CM

## 2018-12-29 DIAGNOSIS — M179 Osteoarthritis of knee, unspecified: Secondary | ICD-10-CM | POA: Diagnosis not present

## 2018-12-29 DIAGNOSIS — Z806 Family history of leukemia: Secondary | ICD-10-CM

## 2018-12-29 DIAGNOSIS — I10 Essential (primary) hypertension: Secondary | ICD-10-CM | POA: Diagnosis not present

## 2018-12-29 DIAGNOSIS — R918 Other nonspecific abnormal finding of lung field: Secondary | ICD-10-CM | POA: Diagnosis not present

## 2018-12-29 DIAGNOSIS — C3412 Malignant neoplasm of upper lobe, left bronchus or lung: Secondary | ICD-10-CM

## 2018-12-29 DIAGNOSIS — Z803 Family history of malignant neoplasm of breast: Secondary | ICD-10-CM | POA: Diagnosis not present

## 2018-12-29 DIAGNOSIS — Z5189 Encounter for other specified aftercare: Secondary | ICD-10-CM | POA: Diagnosis not present

## 2018-12-29 DIAGNOSIS — Z79899 Other long term (current) drug therapy: Secondary | ICD-10-CM

## 2018-12-29 DIAGNOSIS — Z801 Family history of malignant neoplasm of trachea, bronchus and lung: Secondary | ICD-10-CM

## 2018-12-29 DIAGNOSIS — M810 Age-related osteoporosis without current pathological fracture: Secondary | ICD-10-CM | POA: Diagnosis not present

## 2018-12-29 DIAGNOSIS — R61 Generalized hyperhidrosis: Secondary | ICD-10-CM

## 2018-12-29 DIAGNOSIS — C3490 Malignant neoplasm of unspecified part of unspecified bronchus or lung: Secondary | ICD-10-CM

## 2018-12-29 DIAGNOSIS — Z8249 Family history of ischemic heart disease and other diseases of the circulatory system: Secondary | ICD-10-CM | POA: Diagnosis not present

## 2018-12-29 DIAGNOSIS — Z5111 Encounter for antineoplastic chemotherapy: Secondary | ICD-10-CM | POA: Insufficient documentation

## 2018-12-29 DIAGNOSIS — F419 Anxiety disorder, unspecified: Secondary | ICD-10-CM | POA: Diagnosis not present

## 2018-12-29 DIAGNOSIS — F329 Major depressive disorder, single episode, unspecified: Secondary | ICD-10-CM | POA: Diagnosis not present

## 2018-12-29 DIAGNOSIS — J441 Chronic obstructive pulmonary disease with (acute) exacerbation: Secondary | ICD-10-CM | POA: Diagnosis not present

## 2018-12-29 DIAGNOSIS — J440 Chronic obstructive pulmonary disease with acute lower respiratory infection: Secondary | ICD-10-CM | POA: Diagnosis not present

## 2018-12-29 NOTE — Patient Instructions (Signed)
[facility] Discharge Instructions     Thank you for choosing  [facility] Center at [facility] to provide your oncology and hematology care.  To afford each patient quality time with our provider, please arrive at least 15 minutes before your scheduled appointment time.   If you have a lab appointment with the Cancer Center please come in thru the  Main Entrance and check in at the main information desk  You need to re-schedule your appointment should you arrive 10 or more minutes late.  We strive to give you quality time with our providers, and arriving late affects you and other patients whose appointments are after yours.  Also, if you no show three or more times for appointments you may be dismissed from the clinic at the providers discretion.     Again, thank you for choosing [facility].  Our hope is that these requests will decrease the amount of time that you wait before being seen by our physicians.       _____________________________________________________________  Should you have questions after your visit to [facility], please contact our office at [phone] between the hours of 8:00 a.m. and 4:30 p.m.  Voicemails left after 4:00 p.m. will not be returned until the following business day.  For prescription refill requests, have your pharmacy contact our office and allow 72 hours.    Cancer Center Support Programs:   > Cancer Support Group  2nd Tuesday of the month 1pm-2pm, Journey Room    

## 2018-12-29 NOTE — Assessment & Plan Note (Addendum)
1.  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  She was found to have an abnormal chest x-ray followed by CT scan which showed left upper lobe lung mass. - She smoked 1 to 2 packs of cigarettes per day for the last 50 years.  She quit smoking on 10/23/2018. - Denies any significant weight loss.  But has night sweats occasionally.  Denies any hemoptysis. - We reviewed the results of the PET CT scan dated 11/17/2018 which showed left upper lobe lung mass, 4.9 cm.  No evidence of adenopathy or distant metastasis. -MRI of the brain was negative for intracranial metastasis. - She underwent left upper lobectomy and lymph node biopsy on 12/15/2018. - Her wound is healing well.  She is not requiring pain medication on a regular basis. -We reviewed the results of the pathology which showed high-grade spindle cell malignancy, 4.6 cm, margins negative, lymph nodes are negative.  Differential diagnosis includes sarcomatoid carcinoma and sarcoma.  Pathological staging is pT2b, PN0. -Her tumor was sent for further testing with cancer type ID. - In general sarcomatoid carcinomas usually have a poor prognosis.  We discussed the need for adjuvant chemotherapy with 4 cycles of carboplatin and paclitaxel.  We will confirm it once we get the results of the cancer type ID. -I will see her back in 2 weeks to discuss the results of cancer type ID and chemotherapy regimen.  She does seem to have good peripheral veins and does not require a port.  2.  Family history: -Mother had lung cancer and was never smoker.  Father had pancreatic cancer. -1 sister has colon cancer in 1 sister with leukemia.  Another sister had breast cancer. -[facility] testing on 12/09/2018 was negative for hereditary malignancies.

## 2018-12-29 NOTE — Progress Notes (Signed)
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  The Multi-Gene Panel offered by [facility] includes sequencing and/or deletion duplication testing of the following 84 genes: AIP, ALK, APC, ATM, AXIN2,BAP1,  BARD1, BLM, BMPR1A, BRCA1, BRCA2, BRIP1, CASR, CDC73, CDH1, CDK4, CDKN1B, CDKN1C, CDKN2A (p14ARF), CDKN2A (p16INK4a), CEBPA, CHEK2, CTNNA1, DICER1, DIS3L2, EGFR (c.2369C>T, p.Thr790Met variant only), EPCAM (Deletion/duplication testing only), FH, FLCN, GATA2, GPC3, GREM1 (Promoter region deletion/duplication testing only), HOXB13 (c.251G>A, p.Gly84Glu), HRAS, KIT, MAX, MEN1, MET, MITF (c.952G>A, p.Glu318Lys variant only), MLH1, MSH2, MSH3, MSH6, MUTYH, NBN, NF1, NF2, NTHL1, PALB2, PDGFRA, PHOX2B, PMS2, POLD1, POLE, POT1, PRKAR1A, PTCH1, PTEN, RAD50, RAD51C, RAD51D, RB1, RECQL4, RET, RUNX1, SDHAF2, SDHA (sequence changes only), SDHB, SDHC, SDHD, SMAD4, SMARCA4, SMARCB1, SMARCE1, STK11, SUFU, TERC, TERT, TMEM127, TP53, TSC1, TSC2, VHL, WRN and WT1.  The report date is 12/21/2018.     Lung cancer ([place])   12/15/2018 Initial Diagnosis    Lung cancer ([place])    12/21/2018 Genetic Testing    Negative genetic testing on the multicancer panel.  The Multi-Gene Panel offered by [facility] includes sequencing and/or deletion duplication testing of the following 84 genes: AIP, ALK, APC, ATM, AXIN2,BAP1,  BARD1, BLM, BMPR1A, BRCA1, BRCA2, BRIP1, CASR, CDC73, CDH1, CDK4, CDKN1B, CDKN1C, CDKN2A (p14ARF), CDKN2A (p16INK4a), CEBPA, CHEK2, CTNNA1, DICER1, DIS3L2,  EGFR (c.2369C>T, p.Thr790Met variant only), EPCAM (Deletion/duplication testing only), FH, FLCN, GATA2, GPC3, GREM1 (Promoter region deletion/duplication testing only), HOXB13 (c.251G>A, p.Gly84Glu), HRAS, KIT, MAX, MEN1, MET, MITF (c.952G>A, p.Glu318Lys variant only), MLH1, MSH2, MSH3, MSH6, MUTYH, NBN, NF1, NF2, NTHL1, PALB2, PDGFRA, PHOX2B, PMS2, POLD1, POLE, POT1, PRKAR1A, PTCH1, PTEN, RAD50, RAD51C, RAD51D, RB1, RECQL4, RET, RUNX1, SDHAF2, SDHA (sequence changes only), SDHB, SDHC, SDHD, SMAD4, SMARCA4, SMARCB1, SMARCE1, STK11, SUFU, TERC, TERT, TMEM127, TP53, TSC1, TSC2, VHL, WRN and WT1.  The report date is 12/21/2018.     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  The Multi-Gene Panel offered by [facility] includes sequencing and/or deletion duplication testing of the following 84 genes: AIP, ALK, APC, ATM, AXIN2,BAP1,  BARD1, BLM, BMPR1A, BRCA1, BRCA2, BRIP1, CASR, CDC73, CDH1, CDK4, CDKN1B, CDKN1C, CDKN2A (p14ARF), CDKN2A (p16INK4a), CEBPA, CHEK2, CTNNA1, DICER1, DIS3L2, EGFR (c.2369C>T, p.Thr790Met variant only), EPCAM (Deletion/duplication testing only), FH, FLCN, GATA2, GPC3, GREM1 (Promoter region deletion/duplication testing only), HOXB13 (c.251G>A, p.Gly84Glu), HRAS, KIT, MAX, MEN1, MET, MITF (c.952G>A, p.Glu318Lys variant only), MLH1, MSH2, MSH3, MSH6, MUTYH, NBN, NF1, NF2, NTHL1, PALB2, PDGFRA, PHOX2B, PMS2, POLD1, POLE, POT1, PRKAR1A, PTCH1, PTEN, RAD50, RAD51C, RAD51D, RB1, RECQL4, RET, RUNX1, SDHAF2, SDHA (sequence changes only), SDHB, SDHC, SDHD, SMAD4, SMARCA4, SMARCB1, SMARCE1, STK11, SUFU, TERC, TERT, TMEM127, TP53, TSC1, TSC2, VHL, WRN and WT1.  The report date is 12/21/2018.      CANCER STAGING: Cancer Staging DCIS (ductal carcinoma in situ) of breast Staging form: Breast, AJCC 7th Edition - Clinical stage from  12/01/2013: Stage 0 (Tis (DCIS), N0, cM0) - Signed by Heath Lark, MD on 12/01/2013 - Pathologic: Stage 0 (Tis (DCIS), N0, cM0) - Signed by Heath Lark, MD on 12/01/2013    INTERVAL HISTORY:  Ms. Carranza 73 y.o. female returns for routine follow-up for a spindle cell malignancy. She is here today with her family. She is recovering from surgery well. She does have intermittent burning from the surgery site. She is still on home oxygen at night. Denies any nausea, vomiting, or diarrhea. Denies any new pains. Had not noticed any recent bleeding such as epistaxis, hematuria or hematochezia. Denies recent chest pain on exertion, shortness of breath on minimal exertion, pre-syncopal episodes, or palpitations. Denies any numbness or tingling in hands or feet. Denies any recent fevers, infections, or recent hospitalizations. Patient reports appetite at 75% and energy level at 75%.   REVIEW OF SYSTEMS:  Review of Systems  All other systems reviewed and are negative.    PAST MEDICAL/SURGICAL HISTORY:  Past Medical History:  Diagnosis Date  . Anxiety   . Arthritis   . Asthma   . Breast cancer ([place])    breast, right DCIS  . COPD (chronic obstructive pulmonary disease) ([place])   . COPD exacerbation ([place]) 01/03/2014   With hypoxia  . Depression   . Emphysema of lung ([place])   . Family history of breast cancer   . Family history of colon cancer   . Family history of kidney cancer   . Family history of lung cancer   . Family history of pancreatic cancer   . GERD (gastroesophageal reflux disease)   . History of kidney stones   . Hyperlipidemia   . Hypertension   . Obesity 01/05/2014  . Osteoporosis   . Osteoporosis, unspecified 12/01/2013  . Pneumonia    x2  . Tobacco abuse 12/01/2013   Past Surgical History:  Procedure Laterality Date  . ABDOMINAL HYSTERECTOMY    . BLADDER SURGERY     had tacked  . BREAST LUMPECTOMY WITH NEEDLE LOCALIZATION AND AXILLARY SENTINEL LYMPH NODE BX Right 11/09/2013    Procedure: BREAST LUMPECTOMY WITH NEEDLE LOCALIZATION AND AXILLARY SENTINEL LYMPH NODE BIOPSY;  Surgeon: Harl Bowie, MD;  Location: [facility];  Service: General;  Laterality: Right;  . BREAST SURGERY Right    lumpectomy  . CHOLECYSTECTOMY    . NODE DISSECTION Left 12/15/2018   Procedure: NODE DISSECTION LEFT LUNG;  Surgeon: Melrose Nakayama, MD;  Location: [facility];  Service: Thoracic;  Laterality: Left;  Marland Kitchen VIDEO ASSISTED THORACOSCOPY (VATS)/ LOBECTOMY Left 12/15/2018   Procedure: VIDEO ASSISTED THORACOSCOPY (VATS)/LEFT UPPER LOBECTOMY;  Surgeon: Melrose Nakayama, MD;  Location: [facility] OR;  Service: Thoracic;  Laterality: Left;     SOCIAL HISTORY:  Social History   Socioeconomic History  . Marital status: Married    Spouse name: Not on file  . Number of children: 3  . Years of education: Not on file  . Highest education level: Not on file  Occupational History  . Occupation: Database administrator    Comment: [employer]    Comment: [place]: [employer]  . Financial resource strain: Not hard at all  . Food insecurity:    Worry: Never true    Inability: Never true  . Transportation needs:    Medical: No    Non-medical: No  Tobacco Use  . Smoking status: Former Smoker    Packs/day: 0.50    Years: 43.00  Pack years: 21.50    Types: Cigarettes    Last attempt to quit: 10/24/2018    Years since quitting: 0.1  . Smokeless tobacco: Never Used  Substance and Sexual Activity  . Alcohol use: No  . Drug use: No  . Sexual activity: Not Currently  Lifestyle  . Physical activity:    Days per week: 0 days    Minutes per session: 0 min  . Stress: Rather much  Relationships  . Social connections:    Talks on phone: More than three times a week    Gets together: More than three times a week    Attends religious service: More than 4 times per year    Active member of club or organization: Yes    Attends meetings of clubs or organizations: More than  4 times per year    Relationship status: Married  . Intimate partner violence:    Fear of current or ex partner: No    Emotionally abused: No    Physically abused: No    Forced sexual activity: No  Other Topics Concern  . Not on file  Social History Narrative  . Not on file    FAMILY HISTORY:  Family History  Problem Relation Age of Onset  . Lung cancer Mother 82       non smoker  . Pancreatic cancer Father 45       d. 49  . Leukemia Sister        dx in her 51s  . Heart disease Sister   . Colon cancer Sister        dx and died in her 63s  . Heart disease Sister   . Lung cancer Maternal Aunt        non-smoker  . Heart disease Sister   . Breast cancer Sister        dx early 28s  . Heart disease Brother   . Cirrhosis Brother        d. 16  . Aneurysm Brother        d. 74s  . Fibromyalgia Daughter   . Kidney cancer Nephew 71  . Kidney cancer Nephew        dx in 66s; great nephew  . Leukemia Nephew   . Liver disease Nephew   . Other Niece        white matter on brain; dx late 58s    CURRENT MEDICATIONS:  Outpatient Encounter Medications as of 12/29/2018  Medication Sig  . albuterol (PROVENTIL HFA;VENTOLIN HFA) 108 (90 BASE) MCG/ACT inhaler Inhale 2 puffs into the lungs 3 (three) times daily. Also use the inhaler every 2 hours if needed for worsening shortness of breath and wheezing.  . Calcium 600-200 MG-UNIT per tablet Take 1 tablet by mouth daily.  . clonazePAM (KLONOPIN) 0.5 MG tablet Take 0.5 mg by mouth at bedtime as needed (sleep).   Marland Kitchen FLUoxetine (PROZAC) 40 MG capsule Take 40 mg by mouth daily.  . fluticasone (FLONASE) 50 MCG/ACT nasal spray Place 2 sprays into both nostrils daily.  . fluticasone furoate-vilanterol (BREO ELLIPTA) 100-25 MCG/INH AEPB Inhale 1 puff into the lungs daily.  Marland Kitchen lisinopril (PRINIVIL,ZESTRIL) 10 MG tablet Take 10 mg by mouth daily.  . metoprolol (LOPRESSOR) 50 MG tablet Take 50-75 mg by mouth 2 (two) times daily. Takes 75 mg in the  morning and 50 mg in the evening  . nabumetone (RELAFEN) 500 MG tablet Take 500 mg by mouth every morning.  Marland Kitchen omeprazole (PRILOSEC)  40 MG capsule Take 40 mg by mouth daily.   Marland Kitchen oxyCODONE (OXY IR/ROXICODONE) 5 MG immediate release tablet Take 1-2 tablets (5-10 mg total) by mouth every 4 (four) hours as needed for severe pain.  . traZODone (DESYREL) 50 MG tablet Take 50 mg by mouth at bedtime.   No facility-administered encounter medications on file as of 12/29/2018.     ALLERGIES:  No Known Allergies   PHYSICAL EXAM:  ECOG Performance status: 1  Vitals:   12/29/18 1116  BP: (!) 162/43  Pulse: (!) 54  Resp: 16  Temp: 98.4 F (36.9 C)  SpO2: 95%   Filed Weights   12/29/18 1111 12/29/18 1116  Weight: 207 lb 4.8 oz (94 kg) 207 lb 4.8 oz (94 kg)    Physical Exam Constitutional:      Appearance: Normal appearance. She is normal weight.  Musculoskeletal: Normal range of motion.  Skin:    General: Skin is warm and dry.  Neurological:     Mental Status: She is alert and oriented to person, place, and time. Mental status is at baseline.  Psychiatric:        Mood and Affect: Mood normal.        Behavior: Behavior normal.        Thought Content: Thought content normal.        Judgment: Judgment normal.   Left chest wall surgery site is very well healed.   LABORATORY DATA:  I have reviewed the labs as listed.  CBC    Component Value Date/Time   WBC 12.6 (H) 12/18/2018 0255   RBC 3.00 (L) 12/18/2018 0255   HGB 9.3 (L) 12/18/2018 0255   HCT 29.3 (L) 12/18/2018 0255   PLT 232 12/18/2018 0255   MCV 97.7 12/18/2018 0255   MCH 31.0 12/18/2018 0255   MCHC 31.7 12/18/2018 0255   RDW 13.4 12/18/2018 0255   LYMPHSABS 1.9 12/04/2018 0950   MONOABS 0.5 12/04/2018 0950   EOSABS 0.1 12/04/2018 0950   BASOSABS 0.0 12/04/2018 0950   CMP Latest Ref Rng & Units 12/18/2018 12/17/2018 12/16/2018  Glucose 70 - 99 mg/dL 121(H) 114(H) 116(H)  BUN 8 - 23 mg/dL <5(L) 6(L) 5(L)  Creatinine  0.44 - 1.00 mg/dL 0.43(L) 0.38(L) 0.46  Sodium 135 - 145 mmol/L 136 138 140  Potassium 3.5 - 5.1 mmol/L 4.6 4.3 3.3(L)  Chloride 98 - 111 mmol/L 101 103 105  CO2 22 - 32 mmol/L _0 Calcium 8.9 - 10.3 mg/dL 8.5(L) 8.1(L) 7.5(L)  Total Protein 6.5 - 8.1 g/dL - 5.6(L) -  Total Bilirubin 0.3 - 1.2 mg/dL - 1.0 -  Alkaline Phos 38 - 126 U/L - 42 -  AST 15 - 41 U/L - 23 -  ALT 0 - 44 U/L - 20 -       DIAGNOSTIC IMAGING:  I have independently reviewed the scans and discussed with the patient.   I have reviewed Francene Finders, NP's note and agree with the documentation.  I personally performed a face-to-face visit, made revisions and my assessment and plan is as follows.    ASSESSMENT & PLAN:   Lung cancer ([place]) 1.  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  She was found to have an abnormal chest x-ray followed by CT scan which showed left upper lobe lung mass. - She smoked 1 to 2 packs of cigarettes per  day for the last 50 years.  She quit smoking on 10/23/2018. - Denies any significant weight loss.  But has night sweats occasionally.  Denies any hemoptysis. - We reviewed the results of the PET CT scan dated 11/17/2018 which showed left upper lobe lung mass, 4.9 cm.  No evidence of adenopathy or distant metastasis. -MRI of the brain was negative for intracranial metastasis. - She underwent left upper lobectomy and lymph node biopsy on 12/15/2018. - Her wound is healing well.  She is not requiring pain medication on a regular basis. -We reviewed the results of the pathology which showed high-grade spindle cell malignancy, 4.6 cm, margins negative, lymph nodes are negative.  Differential diagnosis includes sarcomatoid carcinoma and sarcoma.  Pathological staging is pT2b, PN0. -Her tumor was sent for further testing with cancer type ID. - In general sarcomatoid carcinomas usually  have a poor prognosis.  We discussed the need for adjuvant chemotherapy with 4 cycles of carboplatin and paclitaxel.  We will confirm it once we get the results of the cancer type ID. -I will see her back in 2 weeks to discuss the results of cancer type ID and chemotherapy regimen.  She does seem to have good peripheral veins and does not require a port.  2.  Family history: -Mother had lung cancer and was never smoker.  Father had pancreatic cancer. -1 sister has colon cancer in 1 sister with leukemia.  Another sister had breast cancer. -[facility] testing on 12/09/2018 was negative for hereditary malignancies.      Orders placed this encounter:  Orders Placed This Encounter  Procedures  . CBC with Differential/Platelet  . Comprehensive metabolic panel      Derek Jack, MD [place] [phone]

## 2018-12-30 DIAGNOSIS — C3412 Malignant neoplasm of upper lobe, left bronchus or lung: Secondary | ICD-10-CM | POA: Diagnosis not present

## 2018-12-30 DIAGNOSIS — J449 Chronic obstructive pulmonary disease, unspecified: Secondary | ICD-10-CM | POA: Diagnosis not present

## 2018-12-30 DIAGNOSIS — F419 Anxiety disorder, unspecified: Secondary | ICD-10-CM | POA: Diagnosis not present

## 2018-12-30 DIAGNOSIS — J441 Chronic obstructive pulmonary disease with (acute) exacerbation: Secondary | ICD-10-CM | POA: Diagnosis not present

## 2018-12-30 DIAGNOSIS — M179 Osteoarthritis of knee, unspecified: Secondary | ICD-10-CM | POA: Diagnosis not present

## 2018-12-30 DIAGNOSIS — I2510 Atherosclerotic heart disease of native coronary artery without angina pectoris: Secondary | ICD-10-CM | POA: Diagnosis not present

## 2018-12-30 DIAGNOSIS — I10 Essential (primary) hypertension: Secondary | ICD-10-CM | POA: Diagnosis not present

## 2018-12-30 DIAGNOSIS — Z902 Acquired absence of lung [part of]: Secondary | ICD-10-CM | POA: Diagnosis not present

## 2018-12-30 DIAGNOSIS — F329 Major depressive disorder, single episode, unspecified: Secondary | ICD-10-CM | POA: Diagnosis not present

## 2018-12-30 DIAGNOSIS — Z483 Aftercare following surgery for neoplasm: Secondary | ICD-10-CM | POA: Diagnosis not present

## 2019-01-01 DIAGNOSIS — M179 Osteoarthritis of knee, unspecified: Secondary | ICD-10-CM | POA: Diagnosis not present

## 2019-01-01 DIAGNOSIS — I10 Essential (primary) hypertension: Secondary | ICD-10-CM | POA: Diagnosis not present

## 2019-01-01 DIAGNOSIS — Z902 Acquired absence of lung [part of]: Secondary | ICD-10-CM | POA: Diagnosis not present

## 2019-01-01 DIAGNOSIS — F419 Anxiety disorder, unspecified: Secondary | ICD-10-CM | POA: Diagnosis not present

## 2019-01-01 DIAGNOSIS — F329 Major depressive disorder, single episode, unspecified: Secondary | ICD-10-CM | POA: Diagnosis not present

## 2019-01-01 DIAGNOSIS — Z483 Aftercare following surgery for neoplasm: Secondary | ICD-10-CM | POA: Diagnosis not present

## 2019-01-01 DIAGNOSIS — I2510 Atherosclerotic heart disease of native coronary artery without angina pectoris: Secondary | ICD-10-CM | POA: Diagnosis not present

## 2019-01-01 DIAGNOSIS — C3412 Malignant neoplasm of upper lobe, left bronchus or lung: Secondary | ICD-10-CM | POA: Diagnosis not present

## 2019-01-01 DIAGNOSIS — J449 Chronic obstructive pulmonary disease, unspecified: Secondary | ICD-10-CM | POA: Diagnosis not present

## 2019-01-02 ENCOUNTER — Encounter (HOSPITAL_COMMUNITY): Payer: Self-pay | Admitting: Thoracic Surgery (Cardiothoracic Vascular Surgery)

## 2019-01-02 ENCOUNTER — Other Ambulatory Visit: Payer: Self-pay | Admitting: *Deleted

## 2019-01-02 DIAGNOSIS — C3412 Malignant neoplasm of upper lobe, left bronchus or lung: Secondary | ICD-10-CM | POA: Diagnosis not present

## 2019-01-02 DIAGNOSIS — I2510 Atherosclerotic heart disease of native coronary artery without angina pectoris: Secondary | ICD-10-CM | POA: Diagnosis not present

## 2019-01-02 DIAGNOSIS — F419 Anxiety disorder, unspecified: Secondary | ICD-10-CM | POA: Diagnosis not present

## 2019-01-02 DIAGNOSIS — M179 Osteoarthritis of knee, unspecified: Secondary | ICD-10-CM | POA: Diagnosis not present

## 2019-01-02 DIAGNOSIS — Z483 Aftercare following surgery for neoplasm: Secondary | ICD-10-CM | POA: Diagnosis not present

## 2019-01-02 DIAGNOSIS — I10 Essential (primary) hypertension: Secondary | ICD-10-CM | POA: Diagnosis not present

## 2019-01-02 DIAGNOSIS — Z902 Acquired absence of lung [part of]: Secondary | ICD-10-CM | POA: Diagnosis not present

## 2019-01-02 DIAGNOSIS — J449 Chronic obstructive pulmonary disease, unspecified: Secondary | ICD-10-CM | POA: Diagnosis not present

## 2019-01-02 DIAGNOSIS — F329 Major depressive disorder, single episode, unspecified: Secondary | ICD-10-CM | POA: Diagnosis not present

## 2019-01-02 NOTE — Progress Notes (Signed)
The proposed treatment discussed in cancer conference 01/01/2019 is for discussion purpose only and is not a binding recommendation.  The patient was not physically examined nor present for their treatment options.  Therefore, final treatment plans cannot be decided.

## 2019-01-05 ENCOUNTER — Other Ambulatory Visit: Payer: Self-pay | Admitting: Thoracic Surgery (Cardiothoracic Vascular Surgery)

## 2019-01-05 DIAGNOSIS — C3412 Malignant neoplasm of upper lobe, left bronchus or lung: Secondary | ICD-10-CM | POA: Diagnosis not present

## 2019-01-05 DIAGNOSIS — C3490 Malignant neoplasm of unspecified part of unspecified bronchus or lung: Secondary | ICD-10-CM

## 2019-01-05 DIAGNOSIS — J449 Chronic obstructive pulmonary disease, unspecified: Secondary | ICD-10-CM | POA: Diagnosis not present

## 2019-01-05 DIAGNOSIS — Z483 Aftercare following surgery for neoplasm: Secondary | ICD-10-CM | POA: Diagnosis not present

## 2019-01-05 DIAGNOSIS — I2510 Atherosclerotic heart disease of native coronary artery without angina pectoris: Secondary | ICD-10-CM | POA: Diagnosis not present

## 2019-01-05 DIAGNOSIS — F329 Major depressive disorder, single episode, unspecified: Secondary | ICD-10-CM | POA: Diagnosis not present

## 2019-01-05 DIAGNOSIS — F419 Anxiety disorder, unspecified: Secondary | ICD-10-CM | POA: Diagnosis not present

## 2019-01-05 DIAGNOSIS — I10 Essential (primary) hypertension: Secondary | ICD-10-CM | POA: Diagnosis not present

## 2019-01-05 DIAGNOSIS — M179 Osteoarthritis of knee, unspecified: Secondary | ICD-10-CM | POA: Diagnosis not present

## 2019-01-05 DIAGNOSIS — Z902 Acquired absence of lung [part of]: Secondary | ICD-10-CM | POA: Diagnosis not present

## 2019-01-06 ENCOUNTER — Encounter: Payer: Self-pay | Admitting: Thoracic Surgery (Cardiothoracic Vascular Surgery)

## 2019-01-06 ENCOUNTER — Ambulatory Visit
Admission: RE | Admit: 2019-01-06 | Discharge: 2019-01-06 | Disposition: A | Discharge status: Home / Self Care | Payer: Medicare HMO | Source: Ambulatory Visit | Attending: Thoracic Surgery (Cardiothoracic Vascular Surgery) | Admitting: Thoracic Surgery (Cardiothoracic Vascular Surgery)

## 2019-01-06 ENCOUNTER — Ambulatory Visit (INDEPENDENT_AMBULATORY_CARE_PROVIDER_SITE_OTHER): Payer: Self-pay | Admitting: Thoracic Surgery (Cardiothoracic Vascular Surgery)

## 2019-01-06 ENCOUNTER — Other Ambulatory Visit: Payer: Self-pay

## 2019-01-06 VITALS — BP 159/66 | HR 68 | Resp 16 | Ht 62.0 in | Wt 204.8 lb

## 2019-01-06 DIAGNOSIS — C3412 Malignant neoplasm of upper lobe, left bronchus or lung: Secondary | ICD-10-CM

## 2019-01-06 DIAGNOSIS — Z902 Acquired absence of lung [part of]: Secondary | ICD-10-CM

## 2019-01-06 DIAGNOSIS — J90 Pleural effusion, not elsewhere classified: Secondary | ICD-10-CM | POA: Diagnosis not present

## 2019-01-06 DIAGNOSIS — J9811 Atelectasis: Secondary | ICD-10-CM | POA: Diagnosis not present

## 2019-01-06 DIAGNOSIS — C3490 Malignant neoplasm of unspecified part of unspecified bronchus or lung: Secondary | ICD-10-CM

## 2019-01-06 MED ORDER — OXYCODONE HCL 5 MG PO TABS: 5.0000 mg | ORAL_TABLET | Freq: Four times a day (QID) | ORAL | 0 refills | Status: DC | PRN

## 2019-01-06 MED ORDER — PREDNISONE 5 MG PO TABS: ORAL_TABLET | ORAL | 0 refills | Status: DC

## 2019-01-06 NOTE — Progress Notes (Signed)
Laura Abbott       [place] [address]             [phone]      HPI: Laura Abbott returns for a scheduled follow-up visit.  Laura Abbott is a 73 year old woman with history of tobacco abuse, COPD, bronchitis, anxiety, depression, hypertension, hyperlipidemia, osteoporosis, obesity, and reflux.  She has a 60-pack-year history of smoking before quitting around Thanksgiving 2019.  She underwent a thoracoscopic left upper lobectomy on 12/15/2018.  She had a 4.5 cm mass.  Pathology showed a spindle cell tumor of uncertain origin.  It was sent for genetic testing.  Her postoperative course was uncomplicated and she went home on day 5.  She says she is feeling better.  She is only taking oxycodone about once or sometimes twice a day.  She is down to 2 tablets and is requesting a refill.  She is not having any significant shortness of breath.  She is anxious to increase her activities.  Past Medical History:  Diagnosis Date  . Anxiety   . Arthritis   . Asthma   . Breast cancer ([place])    breast, right DCIS  . COPD (chronic obstructive pulmonary disease) ([place])   . COPD exacerbation ([place]) 01/03/2014   With hypoxia  . Depression   . Emphysema of lung ([place])   . Family history of breast cancer   . Family history of colon cancer   . Family history of kidney cancer   . Family history of lung cancer   . Family history of pancreatic cancer   . GERD (gastroesophageal reflux disease)   . History of kidney stones   . Hyperlipidemia   . Hypertension   . Obesity 01/05/2014  . Osteoporosis   . Osteoporosis, unspecified 12/01/2013  . Pneumonia    x2  . Tobacco abuse 12/01/2013    Current Outpatient Medications  Medication Sig Dispense Refill  . albuterol (PROVENTIL HFA;VENTOLIN HFA) 108 (90 BASE) MCG/ACT inhaler Inhale 2 puffs into the lungs 3 (three) times daily. Also use the inhaler every 2 hours if needed for worsening shortness of breath and wheezing. 1 Inhaler 3  . Calcium  600-200 MG-UNIT per tablet Take 1 tablet by mouth daily.    . clonazePAM (KLONOPIN) 0.5 MG tablet Take 0.5 mg by mouth at bedtime as needed (sleep).     Marland Kitchen FLUoxetine (PROZAC) 40 MG capsule Take 40 mg by mouth daily.    . fluticasone (FLONASE) 50 MCG/ACT nasal spray Place 2 sprays into both nostrils daily.    . fluticasone furoate-vilanterol (BREO ELLIPTA) 100-25 MCG/INH AEPB Inhale 1 puff into the lungs daily.    Marland Kitchen lisinopril (PRINIVIL,ZESTRIL) 10 MG tablet Take 10 mg by mouth daily.    . metoprolol (LOPRESSOR) 50 MG tablet Take 50-75 mg by mouth 2 (two) times daily. Takes 75 mg in the morning and 50 mg in the evening    . nabumetone (RELAFEN) 500 MG tablet Take 500 mg by mouth every morning.    Marland Kitchen omeprazole (PRILOSEC) 40 MG capsule Take 40 mg by mouth daily.     Marland Kitchen oxyCODONE (OXY IR/ROXICODONE) 5 MG immediate release tablet Take 1 tablet (5 mg total) by mouth every 6 (six) hours as needed for severe pain. 20 tablet 0  . traZODone (DESYREL) 50 MG tablet Take 50 mg by mouth at bedtime.    . predniSONE (DELTASONE) 5 MG tablet Take 5 tablets on day 1, 4 on day 2,  3 on day 3, 2 on day 4 and 1 on day 5 15 tablet 0   No current facility-administered medications for this visit.     Physical Exam BP (!) 159/66 (BP Location: Right Arm, Patient Position: Sitting, Cuff Size: Large)   Pulse 68   Resp 16   Ht 5\' 2"  (1.575 m)   Wt 204 lb 12.8 oz (92.9 kg)   SpO2 97% Comment: RA  BMI 37.25 kg/m  73 year old woman in no acute distress Alert and oriented x3 with no focal deficits Lungs diminished at left base, otherwise clear Incisions healing well Cardiac regular rate and rhythm No peripheral edema  Diagnostic Tests: CHEST - 2 VIEW  COMPARISON:  Chest x-ray 12/19/2018.  FINDINGS: Bibasilar opacities (left greater than right), favored to reflect predominantly subsegmental atelectasis. Small left pleural effusion. No right pleural effusion. No evidence of pulmonary edema. No pneumothorax.  Heart size is mildly enlarged. Upper mediastinal contours are within normal limits. Aortic atherosclerosis.  IMPRESSION: 1. Small left pleural effusion with areas of atelectasis throughout the lung bases bilaterally (left greater than right). 2. Aortic atherosclerosis.   Electronically Signed   By: Vinnie Langton M.D.   On: 01/06/2019 09:00 I personally reviewed the chest x-ray images and concur with the findings noted above  Impression: Laura Abbott is a 73 year old woman who underwent a thoracoscopic left upper lobectomy for a stage Ib (T2, N0) non-small cell carcinoma.  This was an unusual appearing tumor and was sent out for genetic testing.  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  Surgical standpoint she is doing well.  She does still have some incisional pain, which is not surprising this early in the postoperative period. I did refill her oxycodone prescription for 20 tablets 1 every 6 hours as needed for pain.  Her activities are unrestricted, but she was advised to build into activities gradually.  She may drive on a limited basis.  She is already seeing Dr. Delton Coombes.  I will forward the genetic testing results to him although he probably has already seen those.  She does have a small left effusion.  This may just be due to space following upper lobectomy, but I think it is worth giving her a trial of a steroid taper to see if that will help at all.  Plan: Prednisone taper Return in 1 month with PA and lateral chest x-ray to check on left pleural effusion.  Melrose Nakayama, MD [facility] [phone]

## 2019-01-07 DIAGNOSIS — M179 Osteoarthritis of knee, unspecified: Secondary | ICD-10-CM | POA: Diagnosis not present

## 2019-01-07 DIAGNOSIS — J449 Chronic obstructive pulmonary disease, unspecified: Secondary | ICD-10-CM | POA: Diagnosis not present

## 2019-01-07 DIAGNOSIS — C3412 Malignant neoplasm of upper lobe, left bronchus or lung: Secondary | ICD-10-CM | POA: Diagnosis not present

## 2019-01-07 DIAGNOSIS — Z902 Acquired absence of lung [part of]: Secondary | ICD-10-CM | POA: Diagnosis not present

## 2019-01-07 DIAGNOSIS — I2510 Atherosclerotic heart disease of native coronary artery without angina pectoris: Secondary | ICD-10-CM | POA: Diagnosis not present

## 2019-01-07 DIAGNOSIS — Z483 Aftercare following surgery for neoplasm: Secondary | ICD-10-CM | POA: Diagnosis not present

## 2019-01-07 DIAGNOSIS — I10 Essential (primary) hypertension: Secondary | ICD-10-CM | POA: Diagnosis not present

## 2019-01-07 DIAGNOSIS — F419 Anxiety disorder, unspecified: Secondary | ICD-10-CM | POA: Diagnosis not present

## 2019-01-07 DIAGNOSIS — F329 Major depressive disorder, single episode, unspecified: Secondary | ICD-10-CM | POA: Diagnosis not present

## 2019-01-08 ENCOUNTER — Encounter: Payer: Self-pay | Admitting: General Surgery

## 2019-01-08 ENCOUNTER — Ambulatory Visit: Payer: Medicare HMO | Admitting: General Surgery

## 2019-01-08 VITALS — BP 193/83 | HR 71 | Temp 97.5°F | Resp 20 | Wt 206.8 lb

## 2019-01-08 DIAGNOSIS — M179 Osteoarthritis of knee, unspecified: Secondary | ICD-10-CM | POA: Diagnosis not present

## 2019-01-08 DIAGNOSIS — Z902 Acquired absence of lung [part of]: Secondary | ICD-10-CM | POA: Diagnosis not present

## 2019-01-08 DIAGNOSIS — F419 Anxiety disorder, unspecified: Secondary | ICD-10-CM | POA: Diagnosis not present

## 2019-01-08 DIAGNOSIS — C3490 Malignant neoplasm of unspecified part of unspecified bronchus or lung: Secondary | ICD-10-CM | POA: Diagnosis not present

## 2019-01-08 DIAGNOSIS — I10 Essential (primary) hypertension: Secondary | ICD-10-CM | POA: Diagnosis not present

## 2019-01-08 DIAGNOSIS — Z483 Aftercare following surgery for neoplasm: Secondary | ICD-10-CM | POA: Diagnosis not present

## 2019-01-08 DIAGNOSIS — C3412 Malignant neoplasm of upper lobe, left bronchus or lung: Secondary | ICD-10-CM | POA: Diagnosis not present

## 2019-01-08 DIAGNOSIS — J449 Chronic obstructive pulmonary disease, unspecified: Secondary | ICD-10-CM | POA: Diagnosis not present

## 2019-01-08 DIAGNOSIS — F329 Major depressive disorder, single episode, unspecified: Secondary | ICD-10-CM | POA: Diagnosis not present

## 2019-01-08 DIAGNOSIS — I2510 Atherosclerotic heart disease of native coronary artery without angina pectoris: Secondary | ICD-10-CM | POA: Diagnosis not present

## 2019-01-08 NOTE — H&P (Signed)
Laura Abbott; [id number]; 25-Feb-1946   HPI Patient is a 73 year old white female who was referred to my care by Dr. Woody Seller and Delton Coombes for Port-A-Cath placement.  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  She states that she is taking a prednisone Dosepak for a cold and should complete it soon.  She is about to undergo chemotherapy soon.  She currently has 5 out of 10 pain which is nonspecific in nature. Past Medical History:  Diagnosis Date  . Anxiety   . Arthritis   . Asthma   . 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 COPD (chronic obstructive pulmonary disease) ([place])   . COPD exacerbation ([place]) 01/03/2014   With hypoxia  . Depression   . Emphysema of lung ([place])   . Family history of breast cancer   . Family history of colon cancer   . Family history of kidney cancer   . Family history of lung cancer   . Family history of pancreatic cancer   . GERD (gastroesophageal reflux disease)   . History of kidney stones   . Hyperlipidemia   . Hypertension   . Obesity 01/05/2014  . Osteoporosis   . Osteoporosis, unspecified 12/01/2013  . Pneumonia    x2  . Tobacco abuse 12/01/2013    Past Surgical History:  Procedure Laterality Date  . ABDOMINAL HYSTERECTOMY    . BLADDER SURGERY     had tacked  . BREAST LUMPECTOMY WITH NEEDLE LOCALIZATION AND AXILLARY SENTINEL LYMPH NODE BX Right 11/09/2013   Procedure: BREAST LUMPECTOMY WITH NEEDLE LOCALIZATION AND AXILLARY SENTINEL LYMPH NODE BIOPSY;  Surgeon: Harl Bowie, MD;  Location: [facility];  Service: General;  Laterality: Right;  . BREAST SURGERY Right    lumpectomy  . CHOLECYSTECTOMY    . NODE DISSECTION Left 12/15/2018   Procedure: NODE DISSECTION LEFT LUNG;  Surgeon: Melrose Nakayama, MD;  Location: [facility];  Service: Thoracic;  Laterality: Left;  Marland Kitchen VIDEO ASSISTED THORACOSCOPY (VATS)/ LOBECTOMY Left 12/15/2018   Procedure: VIDEO ASSISTED THORACOSCOPY (VATS)/LEFT  UPPER LOBECTOMY;  Surgeon: Melrose Nakayama, MD;  Location: [facility] OR;  Service: Thoracic;  Laterality: Left;    Family History  Problem Relation Age of Onset  . Lung cancer Mother 76       non smoker  . Pancreatic cancer Father 89       d. 36  . Leukemia Sister        dx in her 66s  . Heart disease Sister   . Colon cancer Sister        dx and died in her 4s  . Heart disease Sister   . Lung cancer Maternal Aunt        non-smoker  . Heart disease Sister   . Breast cancer Sister        dx early 54s  . Heart disease Brother   . Cirrhosis Brother        d. 36  . Aneurysm Brother        d. 105s  . Fibromyalgia Daughter   . Kidney cancer Nephew 71  . Kidney cancer Nephew        dx in 37s; great nephew  . Leukemia Nephew   . Liver disease Nephew   . Other Niece        white matter on brain; dx late 20s    Current Outpatient Medications on File Prior  to Visit  Medication Sig Dispense Refill  . albuterol (PROVENTIL HFA;VENTOLIN HFA) 108 (90 BASE) MCG/ACT inhaler Inhale 2 puffs into the lungs 3 (three) times daily. Also use the inhaler every 2 hours if needed for worsening shortness of breath and wheezing. 1 Inhaler 3  . Calcium 600-200 MG-UNIT per tablet Take 1 tablet by mouth daily.    . clonazePAM (KLONOPIN) 0.5 MG tablet Take 0.5 mg by mouth at bedtime as needed (sleep).     Marland Kitchen FLUoxetine (PROZAC) 40 MG capsule Take 40 mg by mouth daily.    . fluticasone (FLONASE) 50 MCG/ACT nasal spray Place 2 sprays into both nostrils daily.    . fluticasone furoate-vilanterol (BREO ELLIPTA) 100-25 MCG/INH AEPB Inhale 1 puff into the lungs daily.    Marland Kitchen lisinopril (PRINIVIL,ZESTRIL) 10 MG tablet Take 10 mg by mouth daily.    . metoprolol (LOPRESSOR) 50 MG tablet Take 50-75 mg by mouth 2 (two) times daily. Takes 75 mg in the morning and 50 mg in the evening    . nabumetone (RELAFEN) 500 MG tablet Take 500 mg by mouth every morning.    Marland Kitchen omeprazole (PRILOSEC) 40 MG capsule Take 40 mg by mouth  daily.     Marland Kitchen oxyCODONE (OXY IR/ROXICODONE) 5 MG immediate release tablet Take 1 tablet (5 mg total) by mouth every 6 (six) hours as needed for severe pain. 20 tablet 0  . predniSONE (DELTASONE) 5 MG tablet Take 5 tablets on day 1, 4 on day 2, 3 on day 3, 2 on day 4 and 1 on day 5 15 tablet 0  . traZODone (DESYREL) 50 MG tablet Take 50 mg by mouth at bedtime.     No current facility-administered medications on file prior to visit.     No Known Allergies  Social History   Substance and Sexual Activity  Alcohol Use No    Social History   Tobacco Use  Smoking Status Former Smoker  . Packs/day: 0.50  . Years: 43.00  . Pack years: 21.50  . Types: Cigarettes  . Last attempt to quit: 10/24/2018  . Years since quitting: 0.2  Smokeless Tobacco Never Used    Review of Systems  Constitutional: Positive for malaise/fatigue.  HENT: Positive for sinus pain.   Eyes: Negative.   Respiratory: Positive for wheezing.   Cardiovascular: Negative.   Gastrointestinal: Positive for heartburn.  Genitourinary: Positive for urgency.  Musculoskeletal: Positive for back pain and joint pain.  Skin: Negative.   Neurological: Negative.   Endo/Heme/Allergies: Negative.   Psychiatric/Behavioral: Negative.     Objective   Vitals:   01/08/19 1104  BP: (!) 193/83  Pulse: 71  Resp: 20  Temp: (!) 97.5 F (36.4 C)    Physical Exam Vitals signs reviewed.  Constitutional:      Appearance: Normal appearance. She is obese. She is not ill-appearing.  HENT:     Head: Normocephalic and atraumatic.  Cardiovascular:     Rate and Rhythm: Normal rate and regular rhythm.     Heart sounds: Normal heart sounds. No murmur. No friction rub. No gallop.   Pulmonary:     Effort: Pulmonary effort is normal. No respiratory distress.     Breath sounds: Normal breath sounds. No stridor. No wheezing, rhonchi or rales.  Skin:    General: Skin is warm and dry.  Neurological:     Mental Status: She is alert and  oriented to person, place, and time.   Oncology notes reviewed Assessment  Left lung carcinoma,  need for central venous access Plan   Patient is scheduled for Port-A-Cath insertion on 01/19/2019.  The risks and benefits of the procedure including bleeding, infection, and pneumothorax were fully explained to the patient, who gave informed consent.

## 2019-01-08 NOTE — Patient Instructions (Signed)

## 2019-01-08 NOTE — Patient Instructions (Addendum)
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  You will receive etoposide (VP-16) and carboplatin to treat your disease.  You will be treated on Days 1-3 every three weeks. You will receive both medications on the first day of your treatment, and just etoposide on Days 2 and 3. The intent of this treatment is to cure this cancer.  You will see the doctor regularly throughout treatment.  We monitor your lab work prior to every treatment. The doctor monitors your response to treatment by the way you are feeling, your blood work, and scans periodically.  There will be wait times while you are here for treatment.  It will take about 30 minutes to 1 hour for your lab work to result.  Then there will be wait times while pharmacy mixes your medications.   Medications you will receive in the clinic prior to your chemotherapy medications:  Aloxi:  ALOXI is a medicine called an "antiemetic."   ALOXI is used in adults to help prevent the  nausea and vomiting that happens with certain anti-cancer medicines (chemotherapy).  Aloxi is a long acting medication, and will remain in your system for 24-36 hours.   Dexamethasone:  This is a steroid given prior to chemotherapy to help prevent allergic reactions; it may also help prevent and control nausea and diarrhea.    Carboplatin (Paraplatin, CBDCA)  About This Drug  Carboplatin is used to treat cancer. It is given in the vein (IV).  Possible Side Effects  . Bone marrow suppression. This is a decrease in the number of white blood cells, red blood cells, and platelets. This may raise your risk of infection, make you tired and weak (fatigue), and raise your risk of bleeding. . Nausea and vomiting (throwing up)  . Weakness  . Changes in your liver function  . Changes in your kidney function  . Electrolyte changes  . Pain  Note: Each of the side effects above was  reported in 20% or greater of patients treated with carboplatin. Not all possible side effects are included above.  Warnings and Precautions  . Severe bone marrow suppression  . Allergic reactions, including anaphylaxis are rare but may happen in some patients. Signs of allergic reaction to this drug may be swelling of the face, feeling like your tongue or throat are swelling, trouble breathing, rash, itching, fever, chills, feeling dizzy, and/or feeling that your heart is beating in a fast or not normal way. If this happens, do not take another dose of this drug. You should get urgent medical treatment.  . Severe nausea and vomiting  . Effects on the nerves are called peripheral neuropathy. This risk is increased if you are over the age of 54 or if you have received other medicine with risk of peripheral neuropathy. You may feel numbness, tingling, or pain in your hands and feet. It may be hard for you to button your clothes, open jars, or walk as usual. The effect on the nerves may get worse with more doses of the drug. These effects get better in some people after the drug is stopped but it does not get better in all people.  Marland Kitchen Blurred vision, loss of vision or other changes in eyesight  . Decreased hearing   - Skin and tissue irritation including redness, pain, warmth, or swelling at the IV site if the drug leaks out of the vein and into nearby tissue.  Marland Kitchen  Severe changes in your kidney function, which can cause kidney failure  . Severe changes in your liver function, which can cause liver failure  Note: Some of the side effects above are very rare. If you have concerns and/or questions, please discuss them with your medical team.  Important Information  . This drug may be present in the saliva, tears, sweat, urine, stool, vomit, semen, and vaginal secretions. Talk to your doctor and/or your nurse about the necessary precautions to take during this time.  Treating Side  Effects  . Manage tiredness by pacing your activities for the day.  . Be sure to include periods of rest between energy-draining activities.  . To decrease the risk of infection, wash your hands regularly.  . Avoid close contact with people who have a cold, the flu, or other infections.  . Take your temperature as your doctor or nurse tells you, and whenever you feel like you may have a fever.  . To help decrease the risk of bleeding, use a soft toothbrush. Check with your nurse before using dental floss.  . Be very careful when using knives or tools.  . Use an electric shaver instead of a razor.  . Drink plenty of fluids (a minimum of eight glasses per day is recommended).  . If you throw up or have loose bowel movements, you should drink more fluids so that you do not become dehydrated (lack of water in the body from losing too much fluid).  . To help with nausea and vomiting, eat small, frequent meals instead of three large meals a day. Choose foods and drinks that are at room temperature. Ask your nurse or doctor about other helpful tips and medicine that is available to help stop or lessen these symptoms.  . If you have numbness and tingling in your hands and feet, be careful when cooking, walking, and handling sharp objects and hot liquids.  Marland Kitchen Keeping your pain under control is important to your well-being. Please tell your doctor or nurse if you are experiencing pain.  Food and Drug Interactions . There are no known interactions of carboplatin with food.  . This drug may interact with other medicines. Tell your doctor and pharmacist about all the prescription and over-the-counter medicines and dietary supplements (vitamins, minerals, herbs and others) that you are taking at this time. Also, check with your doctor or pharmacist before starting any new prescription or over-the-counter medicines, or dietary supplements to make sure that there are no interactions.  When  to Call the Doctor  Call your doctor or nurse if you have any of these symptoms and/or any new or unusual symptoms:  . Fever of 100.4 F (38 C) or higher  . Chills  . Tiredness that interferes with your daily activities  . Feeling dizzy or lightheaded  . Easy bleeding or bruising  . Nausea that stops you from eating or drinking and/or is not relieved by prescribed medicines  . Throwing up more than 3 times a day  . Blurred vision or other changes in eyesight  . Decrease in hearing or ringing in the ear  . Signs of allergic reaction: swelling of the face, feeling like your tongue or throat are swelling, trouble breathing, rash, itching, fever, chills, feeling dizzy, and/or feeling that your heart is beating in a fast or not normal way. If this happens, call 911 for emergency care.  . Signs of possible liver problems: dark urine, pale bowel movements, bad stomach pain, feeling very tired  and weak, unusual itching, or yellowing of the eyes or skin  . Decreased urine, or very dark urine  . Numbness, tingling, or pain in your hands and feet  . Pain that does not go away or is not relieved by prescribed medicine  . While you are getting this drug, please tell your nurse right away if you have any pain, redness, or swelling at the site of the IV infusion, or if you have any new onset of symptoms, or if you just feel "different" from before when the infusion was started.   Reproduction Warnings  . Pregnancy warning: This drug may have harmful effects on the unborn baby. Women of child bearing potential should use effective methods of birth control during your cancer treatment. Let your doctor know right away if you think you may be pregnant.  . Breastfeeding warning: It is not known if this drug passes into breast milk. For this reason, women should not breastfeed during treatment because this drug could enter the breast milk and cause harm to a breastfeeding baby.  .  Fertility warning: Human fertility studies have not been done with this drug. Talk with your doctor or nurse if you plan to have children. Ask for information on sperm or egg banking.  Etoposide  About This Drug  Etoposide is used to treat cancer. It is given in the vein (IV) and orally (by mouth).  Possible Side Effects  . Bone marrow suppression. This is a decrease in the number of white blood cells, red blood cells, and platelets. This may raise your risk of infection, make you tired and weak (fatigue), and raise your risk of bleeding.  . Nausea and vomiting (throwing up)  . Hair loss. Hair loss is often temporary, although with certain medicine, hair loss can sometimes be permanent. Hair loss may happen suddenly or gradually. If you lose hair, you may lose it from your head, face, armpits, pubic area, chest, and/or legs. You may also notice your hair getting thin.  Note: Each of the side effects above was reported in 20% or greater of patients treated with etoposide. Not all possible side effects are included above.  Warnings and Precautions  . Severe bone marrow suppression, which may be life-threatening  . This drug may raise your risk of getting a second cancer, such as leukemia  . Low blood pressure with rapid infusion of the medication  . Allergic reactions, including anaphylaxis are rare but may happen in some patients. Signs of allergic reaction to this drug may be swelling of the face, feeling like your tongue or throat are swelling, trouble breathing, rash, itching, fever, chills, feeling dizzy, and/or feeling that your heart is beating in a fast or not normal way. If this happens, do not take another dose of this drug. You should get urgent medical treatment.  . These side effects may be more severe if you are receiving high doses of this medication included in pre-transplant chemotherapy.  Treating Side Effects  . Manage tiredness by pacing your activities  for the day.  . Be sure to include periods of rest between energy-draining activities.  . To decrease the risk of infection, wash your hands regularly.  . Avoid close contact with people who have a cold, the flu, or other infections.  . Take your temperature as your doctor or nurse tells you, and whenever you feel like you may have a fever.  . To help decrease bleeding, use a soft toothbrush. Check with your nurse  before using dental floss.  . Be very careful when using knives or tools.  . Use an electric shaver instead of a razor.  . Drink plenty of fluids (a minimum of eight glasses per day is recommended).  . If you throw up or have loose bowel movements, you should drink more fluids so that you do not become dehydrated (lack of water in the body from losing too much fluid).  . To help with nausea and vomiting, eat small, frequent meals instead of three large meals a day. Choose foods and drinks that are at room temperature. Ask your nurse or doctor about other helpful tips and medicine that is available to help or stop lessen these symptoms.  . To help with hair loss, wash with a mild shampoo and avoid washing your hair every day.  . Avoid rubbing your scalp, pat your hair or scalp dry.  . Avoid coloring your hair.  . Limit your use of hair spray, electric curlers, blow dryers, and curling irons.  . If you are interested in getting a wig, talk to your nurse. You can also call the [employer] at [phone] to find out information about the "Look Good, Feel Better" program close to where you live. It is a free program where women getting chemotherapy can learn about wigs, turbans and scarves as well as makeup techniques and skin and nail care.  Food and Drug Interactions  . There are no known interactions of etoposide with food.  . This drug may interact with other medicines. Tell your doctor and pharmacist about all the medicines and dietary supplements  (vitamins, minerals, herbs and others) that you are taking at this time. The safety and use of dietary supplements and alternative diets are often not known. Using these might affect your cancer or interfere with your treatment. Until more is known, you should not use dietary supplements or alternative diets without your cancer doctor's help.  . There are known interactions of etoposide with blood thinning medicine such as warfarin. Ask your doctor what precautions you should take.  When to Call the Doctor  Call your doctor or nurse if you have any of these symptoms and/or any new or unusual symptoms:  . Fever of 100.4 F (38 C) or higher  . Chills  . Tiredness that interferes with your daily activities  . Feeling dizzy or lightheaded  . Feeling that your heart is beating in a fast or not normal way (palpitations)  . Easy bleeding or bruising  . Nausea that stops you from eating or drinking and/or is not relieved by prescribed medicines  . Throwing up more than 3 times a day  . Signs of allergic reaction: swelling of the face, feeling like your tongue or throat are swelling, trouble breathing, rash, itching, fever, chills, feeling dizzy, and/or feeling that your heart is beating in a fast or not normal way  . If you think you may be pregnant or may have impregnated your partner  Reproduction Warnings  . Pregnancy warning: This drug can have harmful effects on the unborn baby. Women of childbearing potential should use effective methods of birth control during your cancer treatment and for at least 6 months after treatment. Men with female partners of childbearing potential should use effective methods of birth control during your cancer treatment and for at least 4 months after your cancer treatment. Let your doctor know right away if you think you may be pregnant or may have impregnated your partner.  Marland Kitchen  Breastfeeding warning: It is not known if this drug passes into  breast milk. For this reason, women should talk to their doctor about the risks and benefits of breastfeeding during treatment with this drug because this drug may enter the breast milk and cause harm to a breastfeeding baby.  . Fertility warning: In men and women both, this drug may affect your ability to have children in the future. Talk with your doctor or nurse if you plan to have children. Ask for information on sperm or egg banking.   SELF CARE ACTIVITIES WHILE ON CHEMOTHERAPY:  Hydration Increase your fluid intake 48 hours prior to treatment and drink at least 8 to 12 cups (64 ounces) of water/decaffeinated beverages per day after treatment. You can still have your cup of coffee or soda but these beverages do not count as part of your 8 to 12 cups that you need to drink daily. No alcohol intake.  Medications Continue taking your normal prescription medication as prescribed.  If you start any new herbal or new supplements please let us know first to make sure it is safe.  Mouth Care Have teeth cleaned professionally before starting treatment. Keep dentures and partial plates clean. Use soft toothbrush and do not use mouthwashes that contain alcohol. Biotene is a good mouthwash that is available at most pharmacies or may be ordered by calling [phone]. Use warm salt water gargles (1 teaspoon salt per 1 quart warm water) before and after meals and at bedtime. Or you may rinse with 2 tablespoons of three-percent hydrogen peroxide mixed in eight ounces of water. If you are still having problems with your mouth or sores in your mouth please call the clinic. If you need dental work, please let the doctor know before you go for your appointment so that we can coordinate the best possible time for you in regards to your chemo regimen. You need to also let your dentist know that you are actively taking chemo. We may need to do labs prior to your dental appointment.  Skin Care Always use  sunscreen that has not expired and with SPF (Sun Protection Factor) of 50 or higher. Wear hats to protect your head from the sun. Remember to use sunscreen on your hands, ears, face, & feet.  Use good moisturizing lotions such as udder cream, eucerin, or even Vaseline. Some chemotherapies can cause dry skin, color changes in your skin and nails.    . Avoid long, hot showers or baths. . Use gentle, fragrance-free soaps and laundry detergent. . Use moisturizers, preferably creams or ointments rather than lotions because the thicker consistency is better at preventing skin dehydration. Apply the cream or ointment within 15 minutes of showering. Reapply moisturizer at night, and moisturize your hands every time after you wash them.  Hair Loss (if your doctor says your hair will fall out)  . If your doctor says that your hair is likely to fall out, decide before you begin chemo whether you want to wear a wig. You may want to shop before treatment to match your hair color. . Hats, turbans, and scarves can also camouflage hair loss, although some people prefer to leave their heads uncovered. If you go bare-headed outdoors, be sure to use sunscreen on your scalp. . Cut your hair short. It eases the inconvenience of shedding lots of hair, but it also can reduce the emotional impact of watching your hair fall out. . Don't perm or color your hair during chemotherapy. Those chemical  treatments are already damaging to hair and can enhance hair loss. Once your chemo treatments are done and your hair has grown back, it's OK to resume dyeing or perming hair.  With chemotherapy, hair loss is almost always temporary. But when it grows back, it may be a different color or texture. In older adults who still had hair color before chemotherapy, the new growth may be completely gray.  Often, new hair is very fine and soft.  Infection Prevention Please wash your hands for at least 30 seconds using warm soapy water.  Handwashing is the #1 way to prevent the spread of germs. Stay away from sick people or people who are getting over a cold. If you develop respiratory systems such as green/yellow mucus production or productive cough or persistent cough let us know and we will see if you need an antibiotic. It is a good idea to keep a pair of gloves on when going into grocery stores/[employer] to decrease your risk of coming into contact with germs on the carts, etc. Carry alcohol hand gel with you at all times and use it frequently if out in public. If your temperature reaches 100.5 or higher please call the clinic and let us know.  If it is after hours or on the weekend please go to the ER if your temperature is over 100.5.  Please have your own personal thermometer at home to use.    Sex and bodily fluids If you are going to have sex, a condom must be used to protect the person that isn't taking chemotherapy. Chemo can decrease your libido (sex drive). For a few days after chemotherapy, chemotherapy can be excreted through your bodily fluids.  When using the toilet please close the lid and flush the toilet twice.  Do this for a few day after you have had chemotherapy.   Effects of chemotherapy on your sex life Some changes are simple and won't last long. They won't affect your sex life permanently.  Sometimes you may feel: . too tired . not strong enough to be very active . sick or sore  . not in the mood . anxious or low Your anxiety might not seem related to sex. For example, you may be worried about the cancer and how your treatment is going. Or you may be worried about money, or about how you family are coping with your illness. These things can cause stress, which can affect your interest in sex. It's important to talk to your partner about how you feel. Remember - the changes to your sex life don't usually last long. There's usually no medical reason to stop having sex during chemo. The drugs won't have any  long term physical effects on your performance or enjoyment of sex. Cancer can't be passed on to your partner during sex  Contraception It's important to use reliable contraception during treatment. Avoid getting pregnant while you or your partner are having chemotherapy. This is because the drugs may harm the baby. Sometimes chemotherapy drugs can leave a man or woman infertile.  This means you would not be able to have children in the future. You might want to talk to someone about permanent infertility. It can be very difficult to learn that you may no longer be able to have children. Some people find counselling helpful. There might be ways to preserve your fertility, although this is easier for men than for women. You may want to speak to a fertility expert. You can talk about  sperm banking or harvesting your eggs. You can also ask about other fertility options, such as donor eggs. If you have or have had breast cancer, your doctor might advise you not to take the contraceptive pill. This is because the hormones in it might affect the cancer.  It is not known for sure whether or not chemotherapy drugs can be passed on through semen or secretions from the vagina. Because of this some doctors advise people to use a barrier method if you have sex during treatment. This applies to vaginal, anal or oral sex. Generally, doctors advise a barrier method only for the time you are actually having the treatment and for about a week after your treatment. Advice like this can be worrying, but this does not mean that you have to avoid being intimate with your partner. You can still have close contact with your partner and continue to enjoy sex.  Animals If you have cats or birds we just ask that you not change the litter or change the cage.  Please have someone else do this for you while you are on chemotherapy.   Food Safety During and After Cancer Treatment Food safety is important for people both during and  after cancer treatment. Cancer and cancer treatments, such as chemotherapy, radiation therapy, and stem cell/bone marrow transplantation, often weaken the immune system. This makes it harder for your body to protect itself from foodborne illness, also called food poisoning. Foodborne illness is caused by eating food that contains harmful bacteria, parasites, or viruses.  Foods to avoid Some foods have a higher risk of becoming tainted with bacteria. These include: Marland Kitchen Unwashed fresh fruit and vegetables, especially leafy vegetables that can hide dirt and other contaminants . Raw sprouts, such as alfalfa sprouts . Raw or undercooked beef, especially ground beef, or other raw or undercooked meat and poultry . Fatty, fried, or spicy foods immediately before or after treatment.  These can sit heavy on your stomach and make you feel nauseous. . Raw or undercooked shellfish, such as oysters. . Sushi and sashimi, which often contain raw fish.  . Unpasteurized beverages, such as unpasteurized fruit juices, raw milk, raw yogurt, or cider . Undercooked eggs, such as soft boiled, over easy, and poached; raw, unpasteurized eggs; or foods made with raw egg, such as homemade raw cookie dough and homemade mayonnaise  Simple steps for food safety  Shop smart. . Do not buy food stored or displayed in an unclean area. . Do not buy bruised or damaged fruits or vegetables. . Do not buy cans that have cracks, dents, or bulges. . Pick up foods that can spoil at the end of your shopping trip and store them in a cooler on the way home.  Prepare and clean up foods carefully. . Rinse all fresh fruits and vegetables under running water, and dry them with a clean towel or paper towel. . Clean the top of cans before opening them. . After preparing food, wash your hands for 20 seconds with hot water and soap. Pay special attention to areas between fingers and under nails. . Clean your utensils and dishes with hot water and  soap. Marland Kitchen Disinfect your kitchen and cutting boards using 1 teaspoon of liquid, unscented bleach mixed into 1 quart of water.    Dispose of old food. . Eat canned and packaged food before its expiration date (the "use by" or "best before" date). . Consume refrigerated leftovers within 3 to 4 days. After that time, throw  out the food. Even if the food does not smell or look spoiled, it still may be unsafe. Some bacteria, such as Listeria, can grow even on foods stored in the refrigerator if they are kept for too long.  Take precautions when eating out. . At restaurants, avoid buffets and salad bars where food sits out for a long time and comes in contact with many people. Food can become contaminated when someone with a virus, often a norovirus, or another "bug" handles it. . Put any leftover food in a "to-go" container yourself, rather than having the server do it. And, refrigerate leftovers as soon as you get home. . Choose restaurants that are clean and that are willing to prepare your food as you order it cooked.   MEDICATIONS:                                                                                                                                                                Compazine/Prochlorperazine 10mg  tablet. Take 1 tablet every 6 hours as needed for nausea/vomiting. (This can make you sleepy)   EMLA cream. Apply a quarter size amount to port site 1 hour prior to chemo. Do not rub in. Cover with plastic wrap.   Over-the-Counter Meds:  Colace - 100 mg capsules - take 2 capsules daily.  If this doesn't help then you can increase to 2 capsules twice daily.  Call us if this does not help your bowels move.   Imodium 2mg  capsule. Take 2 capsules after the 1st loose stool and then 1 capsule every 2 hours until you go a total of 12 hours without having a loose stool. Call the [facility] if loose stools continue. If diarrhea occurs at bedtime, take 2 capsules at bedtime. Then  take 2 capsules every 4 hours until morning. Call [facility].    Diarrhea Sheet   If you are having loose stools/diarrhea, please purchase Imodium and begin taking as outlined:  At the first sign of poorly formed or loose stools you should begin taking Imodium (loperamide) 2 mg capsules.  Take two caplets (4mg ) followed by one caplet (2mg ) every 2 hours until you have had no diarrhea for 12 hours.  During the night take two caplets (4mg ) at bedtime and continue every 4 hours during the night until the morning.  Stop taking Imodium only after there is no sign of diarrhea for 12 hours.    Always call the [facility] if you are having loose stools/diarrhea that you can't get under control.  Loose stools/diarrhea leads to dehydration (loss of water) in your body.  We have other options of trying to get the loose stools/diarrhea to stop but you must let us know!   Constipation Sheet  Colace - 100 mg capsules -  take 2 capsules daily.  If this doesn't help then you can increase to 2 capsules twice daily.  Please call if the above does not work for you.   Do not go more than 2 days without a bowel movement.  It is very important that you do not become constipated.  It will make you feel sick to your stomach (nausea) and can cause abdominal pain and vomiting.   Nausea Sheet   Compazine/Prochlorperazine 10mg  tablet. Take 1 tablet every 6 hours as needed for nausea/vomiting. (This can make you sleepy)  If you are having persistent nausea (nausea that does not stop) please call the [facility] and let us know the amount of nausea that you are experiencing.  If you begin to vomit, you need to call the [facility] and if it is the weekend and you have vomited more than one time and can't get it to stop-go to the Emergency Room.  Persistent nausea/vomiting can lead to dehydration (loss of fluid in your body) and will make you feel terrible.   Ice chips, sips of clear liquids, foods that are @  room temperature, crackers, and toast tend to be better tolerated.   SYMPTOMS TO REPORT AS SOON AS POSSIBLE AFTER TREATMENT:   FEVER GREATER THAN 100.5 F  CHILLS WITH OR WITHOUT FEVER  NAUSEA AND VOMITING THAT IS NOT CONTROLLED WITH YOUR NAUSEA MEDICATION  UNUSUAL SHORTNESS OF BREATH  UNUSUAL BRUISING OR BLEEDING  TENDERNESS IN MOUTH AND THROAT WITH OR WITHOUT PRESENCE OF ULCERS  URINARY PROBLEMS  BOWEL PROBLEMS  UNUSUAL RASH      Wear comfortable clothing and clothing appropriate for easy access to any Portacath or PICC line. Let us know if there is anything that we can do to make your therapy better!    What to do if you need assistance after hours or on the weekends: CALL [phone].  HOLD on the line, do not hang up.  You will hear multiple messages but at the end you will be connected with a nurse triage line.  They will contact the doctor if necessary.  Most of the time they will be able to assist you.  Do not call the hospital operator.      I have been informed and understand all of the instructions given to me and have received a copy. I have been instructed to call the clinic [phone] or my family physician as soon as possible for continued medical care, if indicated. I do not have any more questions at this time but understand that I may call the [facility] or the Patient Navigator at [phone] during office hours should I have questions or need assistance in obtaining follow-up care.

## 2019-01-08 NOTE — Progress Notes (Signed)
Laura Abbott; [id number]; 12/09/1945   HPI Patient is a 73 year old white female who was referred to my care by Dr. Woody Seller and Delton Coombes for Port-A-Cath placement.  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  She states that she is taking a prednisone Dosepak for a cold and should complete it soon.  She is about to undergo chemotherapy soon.  She currently has 5 out of 10 pain which is nonspecific in nature. Past Medical History:  Diagnosis Date  . Anxiety   . Arthritis   . Asthma   . 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 COPD (chronic obstructive pulmonary disease) ([place])   . COPD exacerbation ([place]) 01/03/2014   With hypoxia  . Depression   . Emphysema of lung ([place])   . Family history of breast cancer   . Family history of colon cancer   . Family history of kidney cancer   . Family history of lung cancer   . Family history of pancreatic cancer   . GERD (gastroesophageal reflux disease)   . History of kidney stones   . Hyperlipidemia   . Hypertension   . Obesity 01/05/2014  . Osteoporosis   . Osteoporosis, unspecified 12/01/2013  . Pneumonia    x2  . Tobacco abuse 12/01/2013    Past Surgical History:  Procedure Laterality Date  . ABDOMINAL HYSTERECTOMY    . BLADDER SURGERY     had tacked  . BREAST LUMPECTOMY WITH NEEDLE LOCALIZATION AND AXILLARY SENTINEL LYMPH NODE BX Right 11/09/2013   Procedure: BREAST LUMPECTOMY WITH NEEDLE LOCALIZATION AND AXILLARY SENTINEL LYMPH NODE BIOPSY;  Surgeon: Harl Bowie, MD;  Location: [facility];  Service: General;  Laterality: Right;  . BREAST SURGERY Right    lumpectomy  . CHOLECYSTECTOMY    . NODE DISSECTION Left 12/15/2018   Procedure: NODE DISSECTION LEFT LUNG;  Surgeon: Melrose Nakayama, MD;  Location: [facility];  Service: Thoracic;  Laterality: Left;  Marland Kitchen VIDEO ASSISTED THORACOSCOPY (VATS)/ LOBECTOMY Left 12/15/2018   Procedure: VIDEO ASSISTED THORACOSCOPY (VATS)/LEFT  UPPER LOBECTOMY;  Surgeon: Melrose Nakayama, MD;  Location: [facility] OR;  Service: Thoracic;  Laterality: Left;    Family History  Problem Relation Age of Onset  . Lung cancer Mother 50       non smoker  . Pancreatic cancer Father 23       d. 35  . Leukemia Sister        dx in her 101s  . Heart disease Sister   . Colon cancer Sister        dx and died in her 50s  . Heart disease Sister   . Lung cancer Maternal Aunt        non-smoker  . Heart disease Sister   . Breast cancer Sister        dx early 17s  . Heart disease Brother   . Cirrhosis Brother        d. 45  . Aneurysm Brother        d. 9s  . Fibromyalgia Daughter   . Kidney cancer Nephew 71  . Kidney cancer Nephew        dx in 72s; great nephew  . Leukemia Nephew   . Liver disease Nephew   . Other Niece        white matter on brain; dx late 59s    Current Outpatient Medications on File Prior  to Visit  Medication Sig Dispense Refill  . albuterol (PROVENTIL HFA;VENTOLIN HFA) 108 (90 BASE) MCG/ACT inhaler Inhale 2 puffs into the lungs 3 (three) times daily. Also use the inhaler every 2 hours if needed for worsening shortness of breath and wheezing. 1 Inhaler 3  . Calcium 600-200 MG-UNIT per tablet Take 1 tablet by mouth daily.    . clonazePAM (KLONOPIN) 0.5 MG tablet Take 0.5 mg by mouth at bedtime as needed (sleep).     Marland Kitchen FLUoxetine (PROZAC) 40 MG capsule Take 40 mg by mouth daily.    . fluticasone (FLONASE) 50 MCG/ACT nasal spray Place 2 sprays into both nostrils daily.    . fluticasone furoate-vilanterol (BREO ELLIPTA) 100-25 MCG/INH AEPB Inhale 1 puff into the lungs daily.    Marland Kitchen lisinopril (PRINIVIL,ZESTRIL) 10 MG tablet Take 10 mg by mouth daily.    . metoprolol (LOPRESSOR) 50 MG tablet Take 50-75 mg by mouth 2 (two) times daily. Takes 75 mg in the morning and 50 mg in the evening    . nabumetone (RELAFEN) 500 MG tablet Take 500 mg by mouth every morning.    Marland Kitchen omeprazole (PRILOSEC) 40 MG capsule Take 40 mg by mouth  daily.     Marland Kitchen oxyCODONE (OXY IR/ROXICODONE) 5 MG immediate release tablet Take 1 tablet (5 mg total) by mouth every 6 (six) hours as needed for severe pain. 20 tablet 0  . predniSONE (DELTASONE) 5 MG tablet Take 5 tablets on day 1, 4 on day 2, 3 on day 3, 2 on day 4 and 1 on day 5 15 tablet 0  . traZODone (DESYREL) 50 MG tablet Take 50 mg by mouth at bedtime.     No current facility-administered medications on file prior to visit.     No Known Allergies  Social History   Substance and Sexual Activity  Alcohol Use No    Social History   Tobacco Use  Smoking Status Former Smoker  . Packs/day: 0.50  . Years: 43.00  . Pack years: 21.50  . Types: Cigarettes  . Last attempt to quit: 10/24/2018  . Years since quitting: 0.2  Smokeless Tobacco Never Used    Review of Systems  Constitutional: Positive for malaise/fatigue.  HENT: Positive for sinus pain.   Eyes: Negative.   Respiratory: Positive for wheezing.   Cardiovascular: Negative.   Gastrointestinal: Positive for heartburn.  Genitourinary: Positive for urgency.  Musculoskeletal: Positive for back pain and joint pain.  Skin: Negative.   Neurological: Negative.   Endo/Heme/Allergies: Negative.   Psychiatric/Behavioral: Negative.     Objective   Vitals:   01/08/19 1104  BP: (!) 193/83  Pulse: 71  Resp: 20  Temp: (!) 97.5 F (36.4 C)    Physical Exam Vitals signs reviewed.  Constitutional:      Appearance: Normal appearance. She is obese. She is not ill-appearing.  HENT:     Head: Normocephalic and atraumatic.  Cardiovascular:     Rate and Rhythm: Normal rate and regular rhythm.     Heart sounds: Normal heart sounds. No murmur. No friction rub. No gallop.   Pulmonary:     Effort: Pulmonary effort is normal. No respiratory distress.     Breath sounds: Normal breath sounds. No stridor. No wheezing, rhonchi or rales.  Skin:    General: Skin is warm and dry.  Neurological:     Mental Status: She is alert and  oriented to person, place, and time.   Oncology notes reviewed Assessment  Left lung carcinoma,  need for central venous access Plan   Patient is scheduled for Port-A-Cath insertion on 01/19/2019.  The risks and benefits of the procedure including bleeding, infection, and pneumothorax were fully explained to the patient, who gave informed consent.

## 2019-01-12 ENCOUNTER — Inpatient Hospital Stay (HOSPITAL_COMMUNITY): Payer: Medicare HMO

## 2019-01-12 ENCOUNTER — Other Ambulatory Visit: Payer: Self-pay

## 2019-01-12 ENCOUNTER — Encounter (HOSPITAL_COMMUNITY): Payer: Self-pay | Admitting: Hematology

## 2019-01-12 ENCOUNTER — Inpatient Hospital Stay (HOSPITAL_COMMUNITY): Payer: Medicare HMO | Admitting: Hematology

## 2019-01-12 VITALS — BP 157/99 | HR 61 | Temp 98.3°F | Resp 16 | Wt 205.4 lb

## 2019-01-12 DIAGNOSIS — Z86000 Personal history of in-situ neoplasm of breast: Secondary | ICD-10-CM | POA: Diagnosis not present

## 2019-01-12 DIAGNOSIS — Z8249 Family history of ischemic heart disease and other diseases of the circulatory system: Secondary | ICD-10-CM

## 2019-01-12 DIAGNOSIS — R61 Generalized hyperhidrosis: Secondary | ICD-10-CM | POA: Diagnosis not present

## 2019-01-12 DIAGNOSIS — C3490 Malignant neoplasm of unspecified part of unspecified bronchus or lung: Secondary | ICD-10-CM

## 2019-01-12 DIAGNOSIS — Z87891 Personal history of nicotine dependence: Secondary | ICD-10-CM

## 2019-01-12 DIAGNOSIS — Z801 Family history of malignant neoplasm of trachea, bronchus and lung: Secondary | ICD-10-CM

## 2019-01-12 DIAGNOSIS — Z806 Family history of leukemia: Secondary | ICD-10-CM | POA: Diagnosis not present

## 2019-01-12 DIAGNOSIS — Z5189 Encounter for other specified aftercare: Secondary | ICD-10-CM | POA: Diagnosis not present

## 2019-01-12 DIAGNOSIS — C3412 Malignant neoplasm of upper lobe, left bronchus or lung: Secondary | ICD-10-CM | POA: Diagnosis not present

## 2019-01-12 DIAGNOSIS — Z79899 Other long term (current) drug therapy: Secondary | ICD-10-CM

## 2019-01-12 DIAGNOSIS — K30 Functional dyspepsia: Secondary | ICD-10-CM

## 2019-01-12 DIAGNOSIS — Z5111 Encounter for antineoplastic chemotherapy: Secondary | ICD-10-CM | POA: Diagnosis not present

## 2019-01-12 DIAGNOSIS — Z803 Family history of malignant neoplasm of breast: Secondary | ICD-10-CM

## 2019-01-12 LAB — CBC WITH DIFFERENTIAL/PLATELET
Abs Immature Granulocytes: 0.03 10*3/uL (ref 0.00–0.07)
Basophils Absolute: 0.1 10*3/uL (ref 0.0–0.1)
Basophils Relative: 1 %
Eosinophils Absolute: 1 10*3/uL — ABNORMAL HIGH (ref 0.0–0.5)
Eosinophils Relative: 13 %
HEMATOCRIT: 37.9 % (ref 36.0–46.0)
Hemoglobin: 11.5 g/dL — ABNORMAL LOW (ref 12.0–15.0)
Immature Granulocytes: 0 %
Lymphocytes Relative: 26 %
Lymphs Abs: 2 10*3/uL (ref 0.7–4.0)
MCH: 29.1 pg (ref 26.0–34.0)
MCHC: 30.3 g/dL (ref 30.0–36.0)
MCV: 95.9 fL (ref 80.0–100.0)
Monocytes Absolute: 0.6 10*3/uL (ref 0.1–1.0)
Monocytes Relative: 8 %
NEUTROS ABS: 4.1 10*3/uL (ref 1.7–7.7)
Neutrophils Relative %: 52 %
Platelets: 307 10*3/uL (ref 150–400)
RBC: 3.95 MIL/uL (ref 3.87–5.11)
RDW: 12.8 % (ref 11.5–15.5)
WBC: 7.8 10*3/uL (ref 4.0–10.5)
nRBC: 0 % (ref 0.0–0.2)

## 2019-01-12 LAB — COMPREHENSIVE METABOLIC PANEL
ALT: 18 U/L (ref 0–44)
AST: 20 U/L (ref 15–41)
Albumin: 3.9 g/dL (ref 3.5–5.0)
Alkaline Phosphatase: 51 U/L (ref 38–126)
Anion gap: 10 (ref 5–15)
BILIRUBIN TOTAL: 0.6 mg/dL (ref 0.3–1.2)
BUN: 12 mg/dL (ref 8–23)
CO2: 27 mmol/L (ref 22–32)
Calcium: 8.7 mg/dL — ABNORMAL LOW (ref 8.9–10.3)
Chloride: 103 mmol/L (ref 98–111)
Creatinine, Ser: 0.56 mg/dL (ref 0.44–1.00)
GFR calc Af Amer: >60 mL/min (ref >60)
GFR calc non Af Amer: >60 mL/min (ref >60)
Glucose, Bld: 124 mg/dL — ABNORMAL HIGH (ref 70–99)
Potassium: 3.7 mmol/L (ref 3.5–5.1)
Sodium: 140 mmol/L (ref 135–145)
TOTAL PROTEIN: 7.1 g/dL (ref 6.5–8.1)

## 2019-01-12 MED ORDER — LIDOCAINE-PRILOCAINE 2.5-2.5 % EX CREA: 1.0000 "application " | TOPICAL_CREAM | CUTANEOUS | 3 refills | Status: DC | PRN

## 2019-01-12 MED ORDER — PROCHLORPERAZINE MALEATE 10 MG PO TABS: 10.0000 mg | ORAL_TABLET | Freq: Four times a day (QID) | ORAL | 2 refills | Status: DC | PRN

## 2019-01-12 NOTE — Assessment & Plan Note (Signed)
1.  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  She was found to have an abnormal chest x-ray followed by CT scan which showed left upper lobe lung mass. - She smoked 1 to 2 packs of cigarettes per day for the last 50 years.  She quit smoking on 10/23/2018. - Denies any significant weight loss.  But has night sweats occasionally.  Denies any hemoptysis. - We reviewed the results of the PET CT scan dated 11/17/2018 which showed left upper lobe lung mass, 4.9 cm.  No evidence of adenopathy or distant metastasis. -MRI of the brain was negative for intracranial metastasis. - She underwent left upper lobectomy and lymph node biopsy on 12/15/2018. - Her wound is healing well.  She is not requiring pain medication on a regular basis. -Path report showed high-grade spindle cell malignancy, 4.6 cm, margins negative, lymph nodes negative.  Pathological staging is PT2BPN0. -Cancer type ID testing shows 96% probability of neuroendocrine carcinoma (small cell/large cell carcinoma). - As this type has high risk of recurrence, I have recommended 4 cycles of adjuvant chemotherapy with a small cell carcinoma based regimen.  We will treat her with carboplatin and VP-16 every 3 weeks for 4 cycles.  We discussed the side effects in detail. - She has an appointment for port placement.  She will start therapy after port placement.  We will also give her Neulasta.   2.  Family history: -Mother had lung cancer and was never smoker.  Father had pancreatic cancer. -1 sister has colon cancer in 1 sister with leukemia.  Another sister had breast cancer. -[employer] testing on 12/09/2018 was negative for hereditary malignancies.

## 2019-01-12 NOTE — Progress Notes (Signed)
START ON PATHWAY REGIMEN - Small Cell Lung     A cycle is every 21 days:     Etoposide      Carboplatin   **Always confirm dose/schedule in your pharmacy ordering system**  Patient Characteristics: Postoperative (Pathologic Staging), Adjuvant Therapy Therapeutic Status: Postoperative (Pathologic Staging) AJCC T Category: pT2b AJCC N Category: pN0 AJCC M Category: cM0 AJCC 8 Stage Grouping: IIA Intent of Therapy: Curative Intent, Discussed with Patient

## 2019-01-12 NOTE — Progress Notes (Signed)
Laura Abbott, [place] [address]   CLINIC:  Medical Oncology/Hematology  PCP:  Glenda Chroman, MD [place] [address] [phone]   REASON FOR VISIT: Follow-up for neuroendocrine lung cancer  CURRENT THERAPY: work up  BRIEF ONCOLOGIC HISTORY:    DCIS (ductal carcinoma in situ) of breast   10/28/2013 Initial Diagnosis    DCIS (ductal carcinoma in situ) of breast    12/21/2018 Genetic Testing    Negative genetic testing on the multicancer panel.  The Multi-Gene Panel offered by [facility] includes sequencing and/or deletion duplication testing of the following 84 genes: AIP, ALK, APC, ATM, AXIN2,BAP1,  BARD1, BLM, BMPR1A, BRCA1, BRCA2, BRIP1, CASR, CDC73, CDH1, CDK4, CDKN1B, CDKN1C, CDKN2A (p14ARF), CDKN2A (p16INK4a), CEBPA, CHEK2, CTNNA1, DICER1, DIS3L2, EGFR (c.2369C>T, p.Thr790Met variant only), EPCAM (Deletion/duplication testing only), FH, FLCN, GATA2, GPC3, GREM1 (Promoter region deletion/duplication testing only), HOXB13 (c.251G>A, p.Gly84Glu), HRAS, KIT, MAX, MEN1, MET, MITF (c.952G>A, p.Glu318Lys variant only), MLH1, MSH2, MSH3, MSH6, MUTYH, NBN, NF1, NF2, NTHL1, PALB2, PDGFRA, PHOX2B, PMS2, POLD1, POLE, POT1, PRKAR1A, PTCH1, PTEN, RAD50, RAD51C, RAD51D, RB1, RECQL4, RET, RUNX1, SDHAF2, SDHA (sequence changes only), SDHB, SDHC, SDHD, SMAD4, SMARCA4, SMARCB1, SMARCE1, STK11, SUFU, TERC, TERT, TMEM127, TP53, TSC1, TSC2, VHL, WRN and WT1.  The report date is 12/21/2018.     Lung cancer ([place])   12/15/2018 Initial Diagnosis    Lung cancer ([facility])    12/21/2018 Genetic Testing    Negative genetic testing on the multicancer panel.  The Multi-Gene Panel offered by [facility] includes sequencing and/or deletion duplication testing of the following 84 genes: AIP, ALK, APC, ATM, AXIN2,BAP1,  BARD1, BLM, BMPR1A, BRCA1, BRCA2, BRIP1, CASR, CDC73, CDH1, CDK4, CDKN1B, CDKN1C, CDKN2A (p14ARF), CDKN2A (p16INK4a), CEBPA, CHEK2, CTNNA1, DICER1, DIS3L2, EGFR  (c.2369C>T, p.Thr790Met variant only), EPCAM (Deletion/duplication testing only), FH, FLCN, GATA2, GPC3, GREM1 (Promoter region deletion/duplication testing only), HOXB13 (c.251G>A, p.Gly84Glu), HRAS, KIT, MAX, MEN1, MET, MITF (c.952G>A, p.Glu318Lys variant only), MLH1, MSH2, MSH3, MSH6, MUTYH, NBN, NF1, NF2, NTHL1, PALB2, PDGFRA, PHOX2B, PMS2, POLD1, POLE, POT1, PRKAR1A, PTCH1, PTEN, RAD50, RAD51C, RAD51D, RB1, RECQL4, RET, RUNX1, SDHAF2, SDHA (sequence changes only), SDHB, SDHC, SDHD, SMAD4, SMARCA4, SMARCB1, SMARCE1, STK11, SUFU, TERC, TERT, TMEM127, TP53, TSC1, TSC2, VHL, WRN and WT1.  The report date is 12/21/2018.     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  The Multi-Gene Panel offered by [facility] includes sequencing and/or deletion duplication testing of the following 84 genes: AIP, ALK, APC, ATM, AXIN2,BAP1,  BARD1, BLM, BMPR1A, BRCA1, BRCA2, BRIP1, CASR, CDC73, CDH1, CDK4, CDKN1B, CDKN1C, CDKN2A (p14ARF), CDKN2A (p16INK4a), CEBPA, CHEK2, CTNNA1, DICER1, DIS3L2, EGFR (c.2369C>T, p.Thr790Met variant only), EPCAM (Deletion/duplication testing only), FH, FLCN, GATA2, GPC3, GREM1 (Promoter region deletion/duplication testing only), HOXB13 (c.251G>A, p.Gly84Glu), HRAS, KIT, MAX, MEN1, MET, MITF (c.952G>A, p.Glu318Lys variant only), MLH1, MSH2, MSH3, MSH6, MUTYH, NBN, NF1, NF2, NTHL1, PALB2, PDGFRA, PHOX2B, PMS2, POLD1, POLE, POT1, PRKAR1A, PTCH1, PTEN, RAD50, RAD51C, RAD51D, RB1, RECQL4, RET, RUNX1, SDHAF2, SDHA (sequence changes only), SDHB, SDHC, SDHD, SMAD4, SMARCA4, SMARCB1, SMARCE1, STK11, SUFU, TERC, TERT, TMEM127, TP53, TSC1, TSC2, VHL, WRN and WT1.  The report date is 12/21/2018.     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[place])   01/12/2019 Initial Diagnosis    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[place])    01/19/2019 -  Chemotherapy  The patient had  PALONOSETRON HCL INJECTION 0.25 MG/5ML, 0.25 mg, Intravenous,  Once, 0 of 4 cycles pegfilgrastim-cbqv (UDENYCA) injection 6 mg, 6 mg, Subcutaneous, Once, 0 of 4 cycles CARBOplatin (PARAPLATIN) in sodium chloride 0.9 % 100 mL chemo infusion, , Intravenous,  Once, 0 of 4 cycles etoposide (VEPESID) 200 mg in sodium chloride 0.9 % 500 mL chemo infusion, 100 mg/m2, Intravenous,  Once, 0 of 4 cycles  for chemotherapy treatment.       CANCER STAGING: Cancer Staging DCIS (ductal carcinoma in situ) of breast Staging form: Breast, AJCC 7th Edition - Clinical stage from 12/01/2013: Stage 0 (Tis (DCIS), N0, cM0) - Signed by Heath Lark, MD on 12/01/2013 - Pathologic: Stage 0 (Tis (DCIS), N0, cM0) - Signed by Heath Lark, MD on 12/01/2013    INTERVAL HISTORY:  Laura Abbott 73 y.o. female returns for routine follow-up  for neuroendocrine carcinoma of the lung. She is here with her family. She is having no issues except indigestion at this time. Denies cough.Denies any nausea, vomiting, or diarrhea. Denies any new pains. Had not noticed any recent bleeding such as epistaxis, hematuria or hematochezia. Denies recent chest pain on exertion, shortness of breath on minimal exertion, pre-syncopal episodes, or palpitations. Denies any numbness or tingling in hands or feet. Denies any recent fevers, infections, or recent hospitalizations. Patient reports appetite at 100% and energy level at 50%.   REVIEW OF SYSTEMS:  Review of Systems  HENT:         Indigestion  All other systems reviewed and are negative.    PAST MEDICAL/SURGICAL HISTORY:  Past Medical History:  Diagnosis Date  . Anxiety   . Arthritis   . Asthma   . Breast cancer ([place])    breast, right DCIS  . COPD (chronic obstructive pulmonary disease) ([place])   . COPD exacerbation ([place]) 01/03/2014   With hypoxia  . Depression   . Emphysema of lung ([place])   . Family history of breast cancer   . Family history of colon cancer   . Family history of kidney  cancer   . Family history of lung cancer   . Family history of pancreatic cancer   . GERD (gastroesophageal reflux disease)   . History of kidney stones   . Hyperlipidemia   . Hypertension   . Obesity 01/05/2014  . Osteoporosis   . Osteoporosis, unspecified 12/01/2013  . Pneumonia    x2  . Tobacco abuse 12/01/2013   Past Surgical History:  Procedure Laterality Date  . ABDOMINAL HYSTERECTOMY    . BLADDER SURGERY     had tacked  . BREAST LUMPECTOMY WITH NEEDLE LOCALIZATION AND AXILLARY SENTINEL LYMPH NODE BX Right 11/09/2013   Procedure: BREAST LUMPECTOMY WITH NEEDLE LOCALIZATION AND AXILLARY SENTINEL LYMPH NODE BIOPSY;  Surgeon: Harl Bowie, MD;  Location: [facility];  Service: General;  Laterality: Right;  . BREAST SURGERY Right    lumpectomy  . CHOLECYSTECTOMY    . NODE DISSECTION Left 12/15/2018   Procedure: NODE DISSECTION LEFT LUNG;  Surgeon: Melrose Nakayama, MD;  Location: [facility];  Service: Thoracic;  Laterality: Left;  Marland Kitchen VIDEO ASSISTED THORACOSCOPY (VATS)/ LOBECTOMY Left 12/15/2018   Procedure: VIDEO ASSISTED THORACOSCOPY (VATS)/LEFT UPPER LOBECTOMY;  Surgeon: Melrose Nakayama, MD;  Location: [facility] OR;  Service: Thoracic;  Laterality: Left;     SOCIAL HISTORY:  Social History   Socioeconomic History  . Marital status: Married    Spouse name: Not on file  . Number of children: 3  .  Years of education: Not on file  . Highest education level: Not on file  Occupational History  . Occupation: Database administrator    Comment: [employer]    Comment: [place]: [employer]  . Financial resource strain: Not hard at all  . Food insecurity:    Worry: Never true    Inability: Never true  . Transportation needs:    Medical: No    Non-medical: No  Tobacco Use  . Smoking status: Former Smoker    Packs/day: 0.50    Years: 43.00    Pack years: 21.50    Types: Cigarettes    Last attempt to quit: 10/24/2018    Years since quitting: 0.2  .  Smokeless tobacco: Never Used  Substance and Sexual Activity  . Alcohol use: No  . Drug use: No  . Sexual activity: Not Currently  Lifestyle  . Physical activity:    Days per week: 0 days    Minutes per session: 0 min  . Stress: Rather much  Relationships  . Social connections:    Talks on phone: More than three times a week    Gets together: More than three times a week    Attends religious service: More than 4 times per year    Active member of club or organization: Yes    Attends meetings of clubs or organizations: More than 4 times per year    Relationship status: Married  . Intimate partner violence:    Fear of current or ex partner: No    Emotionally abused: No    Physically abused: No    Forced sexual activity: No  Other Topics Concern  . Not on file  Social History Narrative  . Not on file    FAMILY HISTORY:  Family History  Problem Relation Age of Onset  . Lung cancer Mother 29       non smoker  . Pancreatic cancer Father 64       d. 30  . Leukemia Sister        dx in her 27s  . Heart disease Sister   . Colon cancer Sister        dx and died in her 74s  . Heart disease Sister   . Lung cancer Maternal Aunt        non-smoker  . Heart disease Sister   . Breast cancer Sister        dx early 86s  . Heart disease Brother   . Cirrhosis Brother        d. 34  . Aneurysm Brother        d. 28s  . Fibromyalgia Daughter   . Kidney cancer Nephew 71  . Kidney cancer Nephew        dx in 53s; great nephew  . Leukemia Nephew   . Liver disease Nephew   . Other Niece        white matter on brain; dx late 53s    CURRENT MEDICATIONS:  Outpatient Encounter Medications as of 01/12/2019  Medication Sig Note  . albuterol (PROVENTIL HFA;VENTOLIN HFA) 108 (90 BASE) MCG/ACT inhaler Inhale 2 puffs into the lungs 3 (three) times daily. Also use the inhaler every 2 hours if needed for worsening shortness of breath and wheezing. (Patient taking differently: Inhale 2 puffs into  the lungs 3 (three) times daily as needed for wheezing or shortness of breath. )   . Calcium 600-200 MG-UNIT per tablet  Take 1 tablet by mouth daily.   Marland Kitchen CARBOPLATIN IV Inject into the vein every 21 ( twenty-one) days.   . clonazePAM (KLONOPIN) 0.5 MG tablet Take 0.5 mg by mouth at bedtime.    . ETOPOSIDE IV Inject into the vein every 21 ( twenty-one) days.   Marland Kitchen FLUoxetine (PROZAC) 40 MG capsule Take 40 mg by mouth daily.   . fluticasone (FLONASE) 50 MCG/ACT nasal spray Place 2 sprays into both nostrils daily.   . fluticasone furoate-vilanterol (BREO ELLIPTA) 100-25 MCG/INH AEPB Inhale 1 puff into the lungs daily.   Marland Kitchen lisinopril (PRINIVIL,ZESTRIL) 10 MG tablet Take 10 mg by mouth daily.   . metoprolol (LOPRESSOR) 50 MG tablet Take 50-75 mg by mouth 2 (two) times daily. Takes 75 mg in the morning and 50 mg in the evening   . nabumetone (RELAFEN) 500 MG tablet Take 500 mg by mouth every morning.   Marland Kitchen omeprazole (PRILOSEC) 40 MG capsule Take 40 mg by mouth daily.    Marland Kitchen oxyCODONE (OXY IR/ROXICODONE) 5 MG immediate release tablet Take 1 tablet (5 mg total) by mouth every 6 (six) hours as needed for severe pain. (Patient not taking: Reported on 01/12/2019)   . traZODone (DESYREL) 50 MG tablet Take 50 mg by mouth at bedtime.   . [DISCONTINUED] predniSONE (DELTASONE) 5 MG tablet Take 5 tablets on day 1, 4 on day 2, 3 on day 3, 2 on day 4 and 1 on day 5 (Patient taking differently: Take 5-25 mg by mouth See admin instructions. Take 25 mg on day 1, 13m on day 2, 15 mg on day 3, 10 mg on day 4 and 5 mg on day 5) 01/08/2019: Should be finished on 01/11/2019   No facility-administered encounter medications on file as of 01/12/2019.     ALLERGIES:  No Known Allergies   PHYSICAL EXAM:  ECOG Performance status: 1  Vitals:   01/12/19 1000  BP: (!) 157/99  Pulse: 61  Resp: 16  Temp: 98.3 F (36.8 C)  SpO2: 93%   Filed Weights   01/12/19 1000  Weight: 205 lb 6 oz (93.2 kg)    Physical  Exam Constitutional:      Appearance: Normal appearance. She is normal weight.  Cardiovascular:     Rate and Rhythm: Normal rate and regular rhythm.     Heart sounds: Normal heart sounds.  Pulmonary:     Effort: Pulmonary effort is normal.     Breath sounds: Normal breath sounds.  Abdominal:     General: Abdomen is flat.     Palpations: Abdomen is soft.  Musculoskeletal: Normal range of motion.  Skin:    General: Skin is warm and dry.  Neurological:     Mental Status: She is alert and oriented to person, place, and time. Mental status is at baseline.  Psychiatric:        Mood and Affect: Mood normal.        Behavior: Behavior normal.        Thought Content: Thought content normal.        Judgment: Judgment normal.      LABORATORY DATA:  I have reviewed the labs as listed.  CBC    Component Value Date/Time   WBC 7.8 01/12/2019 0956   RBC 3.95 01/12/2019 0956   HGB 11.5 (L) 01/12/2019 0956   HCT 37.9 01/12/2019 0956   PLT 307 01/12/2019 0956   MCV 95.9 01/12/2019 0956   MCH 29.1 01/12/2019 0956   MCHC 30.3  01/12/2019 0956   RDW 12.8 01/12/2019 0956   LYMPHSABS 2.0 01/12/2019 0956   MONOABS 0.6 01/12/2019 0956   EOSABS 1.0 (H) 01/12/2019 0956   BASOSABS 0.1 01/12/2019 0956   CMP Latest Ref Rng & Units 01/12/2019 12/18/2018 12/17/2018  Glucose 70 - 99 mg/dL 124(H) 121(H) 114(H)  BUN 8 - 23 mg/dL 12 <5(L) 6(L)  Creatinine 0.44 - 1.00 mg/dL 0.56 0.43(L) 0.38(L)  Sodium 135 - 145 mmol/L 140 136 138  Potassium 3.5 - 5.1 mmol/L 3.7 4.6 4.3  Chloride 98 - 111 mmol/L 103 101 103  CO2 22 - 32 mmol/L '27 30 28  ' Calcium 8.9 - 10.3 mg/dL 8.7(L) 8.5(L) 8.1(L)  Total Protein 6.5 - 8.1 g/dL 7.1 - 5.6(L)  Total Bilirubin 0.3 - 1.2 mg/dL 0.6 - 1.0  Alkaline Phos 38 - 126 U/L 51 - 42  AST 15 - 41 U/L 20 - 23  ALT 0 - 44 U/L 18 - 20       DIAGNOSTIC IMAGING:  I have independently reviewed the scans and discussed with the patient.   I have reviewed Francene Finders, NP's note  and agree with the documentation.  I personally performed a face-to-face visit, made revisions and my assessment and plan is as follows.    ASSESSMENT & PLAN:   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HCC) 1.  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  She was found to have an abnormal chest x-ray followed by CT scan which showed left upper lobe lung mass. - She smoked 1 to 2 packs of cigarettes per day for the last 50 years.  She quit smoking on 10/23/2018. - Denies any significant weight loss.  But has night sweats occasionally.  Denies any hemoptysis. - We reviewed the results of the PET CT scan dated 11/17/2018 which showed left upper lobe lung mass, 4.9 cm.  No evidence of adenopathy or distant metastasis. -MRI of the brain was negative for intracranial metastasis. - She underwent left upper lobectomy and lymph node biopsy on 12/15/2018. - Her wound is healing well.  She is not requiring pain medication on a regular basis. -Path report showed high-grade spindle cell malignancy, 4.6 cm, margins negative, lymph nodes negative.  Pathological staging is PT2BPN0. -Cancer type ID testing shows 96% probability of neuroendocrine carcinoma (small cell/large cell carcinoma). - As this type has high risk of recurrence, I have recommended 4 cycles of adjuvant chemotherapy with a small cell carcinoma based regimen.  We will treat her with carboplatin and VP-16 every 3 weeks for 4 cycles.  We discussed the side effects in detail. - She has an appointment for port placement.  She will start therapy after port placement.  We will also give her Neulasta.   2.  Family history: -Mother had lung cancer and was never smoker.  Father had pancreatic cancer. -1 sister has colon cancer in 1 sister with leukemia.  Another sister had breast cancer. -[facility] testing on 12/09/2018 was negative for hereditary malignancies.  Total time spent  is 40 minutes with more than 50% of the time spent face-to-face discussing pathology report, treatment options, chemotherapy side effects and coordination of care.    Orders placed this encounter:  Orders Placed This Encounter  Procedures  . Magnesium  . CBC with Differential/Platelet  . Comprehensive metabolic panel      Derek Jack, MD [place] [phone]

## 2019-01-12 NOTE — Patient Instructions (Signed)
[facility] Discharge Instructions     Thank you for choosing [facility] to provide your oncology and hematology care.  To afford each patient quality time with our provider, please arrive at least 15 minutes before your scheduled appointment time.   If you have a lab appointment with the Cancer Center please come in thru the  Main Entrance and check in at the main information desk  You need to re-schedule your appointment should you arrive 10 or more minutes late.  We strive to give you quality time with our providers, and arriving late affects you and other patients whose appointments are after yours.  Also, if you no show three or more times for appointments you may be dismissed from the clinic at the providers discretion.     Again, thank you for choosing [facility].  Our hope is that these requests will decrease the amount of time that you wait before being seen by our physicians.       _____________________________________________________________  Should you have questions after your visit to [facility], please contact our office at [phone] between the hours of 8:00 a.m. and 4:30 p.m.  Voicemails left after 4:00 p.m. will not be returned until the following business day.  For prescription refill requests, have your pharmacy contact our office and allow 72 hours.    Cancer Center Support Programs:   > Cancer Support Group  2nd Tuesday of the month 1pm-2pm, Journey Room    

## 2019-01-13 ENCOUNTER — Encounter (HOSPITAL_COMMUNITY)
Admission: RE | Admit: 2019-01-13 | Discharge: 2019-01-13 | Disposition: A | Discharge status: Home / Self Care | Payer: Medicare HMO | Source: Ambulatory Visit | Attending: General Surgery | Admitting: General Surgery

## 2019-01-13 ENCOUNTER — Inpatient Hospital Stay (HOSPITAL_COMMUNITY): Payer: Self-pay

## 2019-01-13 DIAGNOSIS — I10 Essential (primary) hypertension: Secondary | ICD-10-CM | POA: Diagnosis not present

## 2019-01-13 DIAGNOSIS — Z902 Acquired absence of lung [part of]: Secondary | ICD-10-CM | POA: Diagnosis not present

## 2019-01-13 DIAGNOSIS — F329 Major depressive disorder, single episode, unspecified: Secondary | ICD-10-CM | POA: Diagnosis not present

## 2019-01-13 DIAGNOSIS — M179 Osteoarthritis of knee, unspecified: Secondary | ICD-10-CM | POA: Diagnosis not present

## 2019-01-13 DIAGNOSIS — Z483 Aftercare following surgery for neoplasm: Secondary | ICD-10-CM | POA: Diagnosis not present

## 2019-01-13 DIAGNOSIS — J449 Chronic obstructive pulmonary disease, unspecified: Secondary | ICD-10-CM | POA: Diagnosis not present

## 2019-01-13 DIAGNOSIS — I2510 Atherosclerotic heart disease of native coronary artery without angina pectoris: Secondary | ICD-10-CM | POA: Diagnosis not present

## 2019-01-13 DIAGNOSIS — F419 Anxiety disorder, unspecified: Secondary | ICD-10-CM | POA: Diagnosis not present

## 2019-01-13 DIAGNOSIS — C3412 Malignant neoplasm of upper lobe, left bronchus or lung: Secondary | ICD-10-CM | POA: Diagnosis not present

## 2019-01-15 DIAGNOSIS — I10 Essential (primary) hypertension: Secondary | ICD-10-CM | POA: Diagnosis not present

## 2019-01-15 DIAGNOSIS — J449 Chronic obstructive pulmonary disease, unspecified: Secondary | ICD-10-CM | POA: Diagnosis not present

## 2019-01-15 DIAGNOSIS — F419 Anxiety disorder, unspecified: Secondary | ICD-10-CM | POA: Diagnosis not present

## 2019-01-15 DIAGNOSIS — Z299 Encounter for prophylactic measures, unspecified: Secondary | ICD-10-CM | POA: Diagnosis not present

## 2019-01-15 DIAGNOSIS — Z6838 Body mass index (BMI) 38.0-38.9, adult: Secondary | ICD-10-CM | POA: Diagnosis not present

## 2019-01-15 DIAGNOSIS — F17210 Nicotine dependence, cigarettes, uncomplicated: Secondary | ICD-10-CM | POA: Diagnosis not present

## 2019-01-16 ENCOUNTER — Ambulatory Visit (HOSPITAL_COMMUNITY): Payer: Self-pay | Admitting: Hematology

## 2019-01-16 ENCOUNTER — Other Ambulatory Visit (HOSPITAL_COMMUNITY): Payer: Self-pay

## 2019-01-18 DIAGNOSIS — Z483 Aftercare following surgery for neoplasm: Secondary | ICD-10-CM | POA: Diagnosis not present

## 2019-01-19 ENCOUNTER — Ambulatory Visit (HOSPITAL_COMMUNITY): Payer: Medicare HMO | Admitting: Anesthesiology

## 2019-01-19 ENCOUNTER — Encounter (HOSPITAL_COMMUNITY): Payer: Self-pay | Admitting: *Deleted

## 2019-01-19 ENCOUNTER — Ambulatory Visit (HOSPITAL_COMMUNITY): Payer: Medicare HMO

## 2019-01-19 ENCOUNTER — Encounter (HOSPITAL_COMMUNITY)
Admission: RE | Disposition: A | Discharge status: Home / Self Care | Payer: Self-pay | Source: Home / Self Care | Attending: General Surgery

## 2019-01-19 ENCOUNTER — Ambulatory Visit (HOSPITAL_COMMUNITY)
Admission: RE | Admit: 2019-01-19 | Discharge: 2019-01-19 | Disposition: A | Discharge status: Home / Self Care | Payer: Medicare HMO | Attending: General Surgery | Admitting: General Surgery

## 2019-01-19 DIAGNOSIS — K219 Gastro-esophageal reflux disease without esophagitis: Secondary | ICD-10-CM | POA: Diagnosis not present

## 2019-01-19 DIAGNOSIS — F329 Major depressive disorder, single episode, unspecified: Secondary | ICD-10-CM | POA: Diagnosis not present

## 2019-01-19 DIAGNOSIS — I10 Essential (primary) hypertension: Secondary | ICD-10-CM | POA: Diagnosis not present

## 2019-01-19 DIAGNOSIS — Z8051 Family history of malignant neoplasm of kidney: Secondary | ICD-10-CM | POA: Diagnosis not present

## 2019-01-19 DIAGNOSIS — Z79899 Other long term (current) drug therapy: Secondary | ICD-10-CM | POA: Diagnosis not present

## 2019-01-19 DIAGNOSIS — J439 Emphysema, unspecified: Secondary | ICD-10-CM | POA: Diagnosis not present

## 2019-01-19 DIAGNOSIS — Z8249 Family history of ischemic heart disease and other diseases of the circulatory system: Secondary | ICD-10-CM | POA: Insufficient documentation

## 2019-01-19 DIAGNOSIS — C3492 Malignant neoplasm of unspecified part of left bronchus or lung: Secondary | ICD-10-CM | POA: Diagnosis not present

## 2019-01-19 DIAGNOSIS — Z791 Long term (current) use of non-steroidal anti-inflammatories (NSAID): Secondary | ICD-10-CM | POA: Diagnosis not present

## 2019-01-19 DIAGNOSIS — Z95828 Presence of other vascular implants and grafts: Secondary | ICD-10-CM

## 2019-01-19 DIAGNOSIS — Z800 Family history of malignant neoplasm of digestive organs: Secondary | ICD-10-CM | POA: Diagnosis not present

## 2019-01-19 DIAGNOSIS — Z7951 Long term (current) use of inhaled steroids: Secondary | ICD-10-CM | POA: Diagnosis not present

## 2019-01-19 DIAGNOSIS — Z801 Family history of malignant neoplasm of trachea, bronchus and lung: Secondary | ICD-10-CM | POA: Insufficient documentation

## 2019-01-19 DIAGNOSIS — Z808 Family history of malignant neoplasm of other organs or systems: Secondary | ICD-10-CM | POA: Diagnosis not present

## 2019-01-19 DIAGNOSIS — Z853 Personal history of malignant neoplasm of breast: Secondary | ICD-10-CM | POA: Diagnosis not present

## 2019-01-19 DIAGNOSIS — Z803 Family history of malignant neoplasm of breast: Secondary | ICD-10-CM | POA: Insufficient documentation

## 2019-01-19 DIAGNOSIS — J449 Chronic obstructive pulmonary disease, unspecified: Secondary | ICD-10-CM | POA: Diagnosis not present

## 2019-01-19 DIAGNOSIS — C3490 Malignant neoplasm of unspecified part of unspecified bronchus or lung: Secondary | ICD-10-CM | POA: Diagnosis not present

## 2019-01-19 DIAGNOSIS — F419 Anxiety disorder, unspecified: Secondary | ICD-10-CM | POA: Insufficient documentation

## 2019-01-19 DIAGNOSIS — Z4682 Encounter for fitting and adjustment of non-vascular catheter: Secondary | ICD-10-CM | POA: Diagnosis not present

## 2019-01-19 DIAGNOSIS — J45909 Unspecified asthma, uncomplicated: Secondary | ICD-10-CM | POA: Diagnosis not present

## 2019-01-19 DIAGNOSIS — F17210 Nicotine dependence, cigarettes, uncomplicated: Secondary | ICD-10-CM | POA: Diagnosis not present

## 2019-01-19 HISTORY — PX: PORTACATH PLACEMENT: SHX2246

## 2019-01-19 SURGERY — INSERTION, TUNNELED CENTRAL VENOUS DEVICE, WITH PORT
Anesthesia: Monitor Anesthesia Care | Site: Chest | Laterality: Right

## 2019-01-19 MED ORDER — HEPARIN SOD (PORK) LOCK FLUSH 100 UNIT/ML IV SOLN
INTRAVENOUS | Status: DC | PRN
  Administered 2019-01-19: 500 [IU] via INTRAVENOUS

## 2019-01-19 MED ORDER — PROPOFOL 10 MG/ML IV BOLUS
INTRAVENOUS | Status: DC | PRN
  Administered 2019-01-19 (×2): 15 mg via INTRAVENOUS

## 2019-01-19 MED ORDER — MIDAZOLAM HCL 2 MG/2ML IJ SOLN
INTRAMUSCULAR | Status: AC
  Filled 2019-01-19: qty 2

## 2019-01-19 MED ORDER — LIDOCAINE HCL (PF) 1 % IJ SOLN
INTRAMUSCULAR | Status: AC
  Filled 2019-01-19: qty 30

## 2019-01-19 MED ORDER — KETOROLAC TROMETHAMINE 30 MG/ML IJ SOLN
15.0000 mg | Freq: Once | INTRAMUSCULAR | Status: AC
  Administered 2019-01-19: 15 mg via INTRAVENOUS
  Filled 2019-01-19: qty 1

## 2019-01-19 MED ORDER — CHLORHEXIDINE GLUCONATE CLOTH 2 % EX PADS: 6.0000 | MEDICATED_PAD | Freq: Once | CUTANEOUS | Status: DC

## 2019-01-19 MED ORDER — CEFAZOLIN SODIUM-DEXTROSE 2-4 GM/100ML-% IV SOLN: 2.0000 g | INTRAVENOUS | Status: DC

## 2019-01-19 MED ORDER — CEFAZOLIN SODIUM-DEXTROSE 2-4 GM/100ML-% IV SOLN
INTRAVENOUS | Status: AC
  Filled 2019-01-19: qty 100

## 2019-01-19 MED ORDER — SODIUM CHLORIDE (PF) 0.9 % IJ SOLN
INTRAMUSCULAR | Status: DC | PRN
  Administered 2019-01-19: 50 mL via INTRAVENOUS

## 2019-01-19 MED ORDER — LACTATED RINGERS IV SOLN
INTRAVENOUS | Status: DC
  Administered 2019-01-19: 10:00:00 via INTRAVENOUS

## 2019-01-19 MED ORDER — PROPOFOL 500 MG/50ML IV EMUL
INTRAVENOUS | Status: DC | PRN
  Administered 2019-01-19: 40 ug/kg/min via INTRAVENOUS

## 2019-01-19 MED ORDER — FENTANYL CITRATE (PF) 100 MCG/2ML IJ SOLN
INTRAMUSCULAR | Status: AC
  Filled 2019-01-19: qty 2

## 2019-01-19 MED ORDER — HEPARIN SOD (PORK) LOCK FLUSH 100 UNIT/ML IV SOLN
INTRAVENOUS | Status: AC
  Filled 2019-01-19: qty 5

## 2019-01-19 MED ORDER — LIDOCAINE HCL (PF) 1 % IJ SOLN
INTRAMUSCULAR | Status: DC | PRN
  Administered 2019-01-19: 8 mL

## 2019-01-19 MED ORDER — FENTANYL CITRATE (PF) 100 MCG/2ML IJ SOLN
INTRAMUSCULAR | Status: DC | PRN
  Administered 2019-01-19: 25 ug via INTRAVENOUS

## 2019-01-19 MED ORDER — MIDAZOLAM HCL 5 MG/5ML IJ SOLN
INTRAMUSCULAR | Status: DC | PRN
  Administered 2019-01-19 (×2): 1 mg via INTRAVENOUS

## 2019-01-19 MED ORDER — PROPOFOL 10 MG/ML IV BOLUS
INTRAVENOUS | Status: AC
  Filled 2019-01-19: qty 40

## 2019-01-19 SURGICAL SUPPLY — 32 items
BAG DECANTER FOR FLEXI CONT (MISCELLANEOUS) ×3 IMPLANT
CHLORAPREP W/TINT 10.5 ML (MISCELLANEOUS) ×3 IMPLANT
CLOTH BEACON ORANGE TIMEOUT ST (SAFETY) ×3 IMPLANT
COVER LIGHT HANDLE STERIS (MISCELLANEOUS) ×6 IMPLANT
COVER WAND RF STERILE (DRAPES) ×2 IMPLANT
DECANTER SPIKE VIAL GLASS SM (MISCELLANEOUS) ×3 IMPLANT
DERMABOND ADVANCED (GAUZE/BANDAGES/DRESSINGS) ×2
DERMABOND ADVANCED .7 DNX12 (GAUZE/BANDAGES/DRESSINGS) ×1 IMPLANT
DRAPE C-ARM FOLDED MOBILE STRL (DRAPES) ×3 IMPLANT
ELECT REM PT RETURN 9FT ADLT (ELECTROSURGICAL) ×3
ELECTRODE REM PT RTRN 9FT ADLT (ELECTROSURGICAL) ×1 IMPLANT
GLOVE BIOGEL PI IND STRL 7.0 (GLOVE) ×2 IMPLANT
GLOVE BIOGEL PI IND STRL 7.5 (GLOVE) IMPLANT
GLOVE BIOGEL PI INDICATOR 7.0 (GLOVE) ×4
GLOVE BIOGEL PI INDICATOR 7.5 (GLOVE) ×2
GLOVE SURG SS PI 7.5 STRL IVOR (GLOVE) ×5 IMPLANT
GOWN STRL REUS W/TWL LRG LVL3 (GOWN DISPOSABLE) ×6 IMPLANT
IV NS 500ML (IV SOLUTION) ×2
IV NS 500ML BAXH (IV SOLUTION) ×1 IMPLANT
KIT PORT POWER 8FR ISP MRI (Port) ×3 IMPLANT
KIT TURNOVER KIT A (KITS) ×3 IMPLANT
NDL HYPO 25X1 1.5 SAFETY (NEEDLE) ×1 IMPLANT
NEEDLE HYPO 25X1 1.5 SAFETY (NEEDLE) ×3 IMPLANT
PACK MINOR (CUSTOM PROCEDURE TRAY) ×3 IMPLANT
PAD ARMBOARD 7.5X6 YLW CONV (MISCELLANEOUS) ×3 IMPLANT
SET BASIN LINEN APH (SET/KITS/TRAYS/PACK) ×3 IMPLANT
SUT MNCRL AB 4-0 PS2 18 (SUTURE) ×3 IMPLANT
SUT VIC AB 3-0 SH 27 (SUTURE) ×2
SUT VIC AB 3-0 SH 27X BRD (SUTURE) ×1 IMPLANT
SYR 20CC LL (SYRINGE) ×3 IMPLANT
SYR 5ML LL (SYRINGE) ×3 IMPLANT
SYR CONTROL 10ML LL (SYRINGE) ×3 IMPLANT

## 2019-01-19 NOTE — Addendum Note (Signed)
Addendum  created 01/19/19 1459 by Nicanor Alcon, MD   Attestation recorded in [place], [place] filed

## 2019-01-19 NOTE — Op Note (Signed)
Patient:  Laura Abbott  DOB:  07-19-1946  MRN:  [id number]   Preop Diagnosis: Spindle cell carcinoma, left lung  Postop Diagnosis: Same  Procedure: Port-A-Cath insertion  Surgeon: Aviva Signs, MD  Anes: MAC  Indications: Patient is a 73 year old white female who is about to undergo chemotherapy for spindle cell carcinoma of the left lung.  The risks and benefits of the procedure including bleeding, infection, and pneumothorax were fully explained to the patient, who gave informed consent.  Procedure note: The patient was placed in supine position.  After monitored anesthesia care was given, the right upper chest was prepped and draped using the usual sterile technique with ChloraPrep.  Surgical site confirmation was performed.  1% Xylocaine was used for local anesthesia.  Incision was made below the right clavicle.  A subcutaneous pocket was formed.  A needle was advanced into the right subclavian vein using the Seldinger technique without difficulty.  A guidewire was then advanced into the right atrium under fluoroscopic guidance.  An introducer and peel-away sheath were placed over the guidewire.  The catheter was inserted through the peel-away sheath and the peel-away sheath was removed.  The catheter was then attached to the port and the port placed in subcutaneous pocket.  Adequate positioning was confirmed by fluoroscopy.  Good backflow of venous blood was noted on aspiration of the port.  The port was flushed with heparin flush.  The subcutaneous layer was reapproximated using a 3-0 Vicryl interrupted suture.  Skin was closed using a 4-0 Monocryl subcuticular suture.  Dermabond was applied.  All tape and needle counts were correct at the end of the procedure.  The patient was awakened and transferred to PACU in stable condition.  A chest x-ray will be performed at that time.  Complications: None  EBL: Minimal  Specimen: None

## 2019-01-19 NOTE — Anesthesia Postprocedure Evaluation (Signed)
Anesthesia Post Note  Patient: Laura Abbott  Procedure(s) Performed: INSERTION PORT-A-CATH (Right Chest)  Patient location during evaluation: PACU Anesthesia Type: MAC Level of consciousness: awake and alert and patient cooperative Pain management: pain level controlled Vital Signs Assessment: post-procedure vital signs reviewed and stable Respiratory status: spontaneous breathing, nonlabored ventilation and respiratory function stable Cardiovascular status: blood pressure returned to baseline Postop Assessment: no apparent nausea or vomiting Anesthetic complications: no     Last Vitals:  Vitals:   01/19/19 1040 01/19/19 1045  BP:  (!) 169/77  Pulse:  66  Resp:  15  Temp: 36.6 C   SpO2:      Last Pain:  Vitals:   01/19/19 1040  TempSrc:   PainSc: 0-No pain                 Jatavis Malek J

## 2019-01-19 NOTE — Discharge Instructions (Signed)
Implanted Port Home Guide °An implanted port is a device that is placed under the skin. It is usually placed in the chest. The device can be used to give IV medicine, to take blood, or for dialysis. You may have an implanted port if: °· You need IV medicine that would be irritating to the small veins in your hands or arms. °· You need IV medicines, such as antibiotics, for a long period of time. °· You need IV nutrition for a long period of time. °· You need dialysis. °Having a port means that your health care provider will not need to use the veins in your arms for these procedures. You may have fewer limitations when using a port than you would if you used other types of long-term IVs, and you will likely be able to return to normal activities after your incision heals. °An implanted port has two main parts: °· Reservoir. The reservoir is the part where a needle is inserted to give medicines or draw blood. The reservoir is round. After it is placed, it appears as a small, raised area under your skin. °· Catheter. The catheter is a thin, flexible tube that connects the reservoir to a vein. Medicine that is inserted into the reservoir goes into the catheter and then into the vein. °How is my port accessed? °To access your port: °· A numbing cream may be placed on the skin over the port site. °· Your health care provider will put on a mask and sterile gloves. °· The skin over your port will be cleaned carefully with a germ-killing soap and allowed to dry. °· Your health care provider will gently pinch the port and insert a needle into it. °· Your health care provider will check for a blood return to make sure the port is in the vein and is not clogged. °· If your port needs to remain accessed to get medicine continuously (constant infusion), your health care provider will place a clear bandage (dressing) over the needle site. The dressing and needle will need to be changed every week, or as told by your health care  provider. °What is flushing? °Flushing helps keep the port from getting clogged. Follow instructions from your health care provider about how and when to flush the port. Ports are usually flushed with saline solution or a medicine called heparin. The need for flushing will depend on how the port is used: °· If the port is only used from time to time to give medicines or draw blood, the port may need to be flushed: °? Before and after medicines have been given. °? Before and after blood has been drawn. °? As part of routine maintenance. Flushing may be recommended every 4-6 weeks. °· If a constant infusion is running, the port may not need to be flushed. °· Throw away any syringes in a disposal container that is meant for sharp items (sharps container). You can buy a sharps container from a pharmacy, or you can make one by using an empty hard plastic bottle with a cover. °How long will my port stay implanted? °The port can stay in for as long as your health care provider thinks it is needed. When it is time for the port to come out, a surgery will be done to remove it. The surgery will be similar to the procedure that was done to put the port in. °Follow these instructions at home: ° °· Flush your port as told by your health care provider. °·   If you need an infusion over several days, follow instructions from your health care provider about how to take care of your port site. Make sure you: ? Wash your hands with soap and water before you change your dressing. If soap and water are not available, use alcohol-based hand sanitizer. ? Change your dressing as told by your health care provider. ? Place any used dressings or infusion bags into a plastic bag. Throw that bag in the trash. ? Keep the dressing that covers the needle clean and dry. Do not get it wet. ? Do not use scissors or sharp objects near the tube. ? Keep the tube clamped, unless it is being used.  Check your port site every day for signs of  infection. Check for: ? Redness, swelling, or pain. ? Fluid or blood. ? Pus or a bad smell.  Protect the skin around the port site. ? Avoid wearing bra straps that rub or irritate the site. ? Protect the skin around your port from seat belts. Place a soft pad over your chest if needed.  Bathe or shower as told by your health care provider. The site may get wet as long as you are not actively receiving an infusion.  Return to your normal activities as told by your health care provider. Ask your health care provider what activities are safe for you.  Carry a medical alert card or wear a medical alert bracelet at all times. This will let health care providers know that you have an implanted port in case of an emergency. Get help right away if:  You have redness, swelling, or pain at the port site.  You have fluid or blood coming from your port site.  You have pus or a bad smell coming from the port site.  You have a fever. Summary  Implanted ports are usually placed in the chest for long-term IV access.  Follow instructions from your health care provider about flushing the port and changing bandages (dressings).  Take care of the area around your port by avoiding clothing that puts pressure on the area, and by watching for signs of infection.  Protect the skin around your port from seat belts. Place a soft pad over your chest if needed.  Get help right away if you have a fever or you have redness, swelling, pain, drainage, or a bad smell at the port site. This information is not intended to replace advice given to you by your health care provider. Make sure you discuss any questions you have with your health care provider. Document Released: 11/12/2005 Document Revised: 12/15/2016 Document Reviewed: 12/15/2016 Elsevier Interactive Patient Education  2019 [place], Care After These instructions provide you with information about caring for  yourself after your procedure. Your health care provider may also give you more specific instructions. Your treatment has been planned according to current medical practices, but problems sometimes occur. Call your health care provider if you have any problems or questions after your procedure. What can I expect after the procedure? After your procedure, you may:  Feel sleepy for several hours.  Feel clumsy and have poor balance for several hours.  Feel forgetful about what happened after the procedure.  Have poor judgment for several hours.  Feel nauseous or vomit.  Have a sore throat if you had a breathing tube during the procedure. Follow these instructions at home: For at least 24 hours after the procedure:  Have a responsible adult stay with you. It is important to have someone help care for you until you are awake and alert.  Rest as needed.  Do not: ? Participate in activities in which you could fall or become injured. ? Drive. ? Use heavy machinery. ? Drink alcohol. ? Take sleeping pills or medicines that cause drowsiness. ? Make important decisions or sign legal documents. ? Take care of children on your own. Eating and drinking  Follow the diet that is recommended by your health care provider.  If you vomit, drink water, juice, or soup when you can drink without vomiting.  Make sure you have little or no nausea before eating solid foods. General instructions  Take over-the-counter and prescription medicines only as told by your health care provider.  If you have sleep apnea, surgery and certain medicines can increase your risk for breathing problems. Follow instructions from your health care provider about wearing your sleep device: ? Anytime you are sleeping, including during daytime naps. ? While taking prescription pain medicines, sleeping medicines, or medicines that make you drowsy.  If you smoke, do not smoke without supervision.  Keep all  follow-up visits as told by your health care provider. This is important. Contact a health care provider if:  You keep feeling nauseous or you keep vomiting.  You feel light-headed.  You develop a rash.  You have a fever. Get help right away if:  You have trouble breathing. Summary  For several hours after your procedure, you may feel sleepy and have poor judgment.  Have a responsible adult stay with you for at least 24 hours or until you are awake and alert. This information is not intended to replace advice given to you by your health care provider. Make sure you discuss any questions you have with your health care provider. Document Released: 03/04/2016 Document Revised: 06/28/2017 Document Reviewed: 03/04/2016 Elsevier Interactive Patient Education  2019 [employer].

## 2019-01-19 NOTE — Interval H&P Note (Signed)
History and Physical Interval Note:  01/19/2019 9:15 AM  Laura Abbott  has presented today for surgery, with the diagnosis of lung cancer  The various methods of treatment have been discussed with the patient and family. After consideration of risks, benefits and other options for treatment, the patient has consented to  Procedure(s): INSERTION PORT-A-CATH as a surgical intervention .  The patient's history has been reviewed, patient examined, no change in status, stable for surgery.  I have reviewed the patient's chart and labs.  Questions were answered to the patient's satisfaction.     Aviva Signs

## 2019-01-19 NOTE — Anesthesia Preprocedure Evaluation (Addendum)
Anesthesia Evaluation  Patient identified by MRN, date of birth, ID band Patient awake    Reviewed: Allergy & Precautions, H&P , NPO status , Patient's Chart, lab work & pertinent test results  Airway Mallampati: II  TM Distance: >3 FB Neck ROM: full    Dental  (+) Edentulous Upper, Dental Advidsory Given, Poor Dentition   Pulmonary asthma , pneumonia, COPD, former smoker,  Small cell lung cancer   Pulmonary exam normal breath sounds clear to auscultation       Cardiovascular Exercise Tolerance: Good hypertension, negative cardio ROS   Rhythm:regular Rate:Normal     Neuro/Psych PSYCHIATRIC DISORDERS Anxiety Depression negative neurological ROS     GI/Hepatic Neg liver ROS, GERD  ,  Endo/Other  negative endocrine ROS  Renal/GU negative Renal ROS  negative genitourinary   Musculoskeletal  (+) Arthritis ,   Abdominal   Peds  Hematology negative hematology ROS (+)   Anesthesia Other Findings Breast cancer (HCC) Primary spindle cell carcinoma of lung  Reproductive/Obstetrics negative OB ROS                            Anesthesia Physical Anesthesia Plan  ASA: III  Anesthesia Plan: MAC   Post-op Pain Management:    Induction:   PONV Risk Score and Plan:   Airway Management Planned:   Additional Equipment:   Intra-op Plan:   Post-operative Plan:   Informed Consent: I have reviewed the patients History and Physical, chart, labs and discussed the procedure including the risks, benefits and alternatives for the proposed anesthesia with the patient or authorized representative who has indicated his/her understanding and acceptance.     Dental Advisory Given  Plan Discussed with: CRNA  Anesthesia Plan Comments:         Anesthesia Quick Evaluation

## 2019-01-19 NOTE — Transfer of Care (Signed)
Immediate Anesthesia Transfer of Care Note  Patient: Laura Abbott  Procedure(s) Performed: INSERTION PORT-A-CATH (Right Chest)  Patient Location: PACU  Anesthesia Type:MAC  Level of Consciousness: awake and patient cooperative  Airway & Oxygen Therapy: Patient Spontanous Breathing and Patient connected to nasal cannula oxygen  Post-op Assessment: Report given to RN, Post -op Vital signs reviewed and stable and Patient moving all extremities  Post vital signs: Reviewed and stable  Last Vitals:  Vitals Value Taken Time  BP    Temp    Pulse    Resp    SpO2      Last Pain:  Vitals:   01/19/19 0855  TempSrc: Oral  PainSc: 0-No pain      Patients Stated Pain Goal: 8 (30/09/23 3007)  Complications: No apparent anesthesia complications

## 2019-01-20 ENCOUNTER — Encounter (HOSPITAL_COMMUNITY): Payer: Self-pay

## 2019-01-20 ENCOUNTER — Other Ambulatory Visit: Payer: Self-pay

## 2019-01-20 ENCOUNTER — Inpatient Hospital Stay (HOSPITAL_COMMUNITY): Payer: Medicare HMO

## 2019-01-20 VITALS — BP 161/66 | HR 79 | Temp 98.1°F | Resp 19

## 2019-01-20 DIAGNOSIS — Z86000 Personal history of in-situ neoplasm of breast: Secondary | ICD-10-CM | POA: Diagnosis not present

## 2019-01-20 DIAGNOSIS — Z87891 Personal history of nicotine dependence: Secondary | ICD-10-CM | POA: Diagnosis not present

## 2019-01-20 DIAGNOSIS — C3490 Malignant neoplasm of unspecified part of unspecified bronchus or lung: Secondary | ICD-10-CM

## 2019-01-20 DIAGNOSIS — Z5111 Encounter for antineoplastic chemotherapy: Secondary | ICD-10-CM | POA: Diagnosis not present

## 2019-01-20 DIAGNOSIS — Z801 Family history of malignant neoplasm of trachea, bronchus and lung: Secondary | ICD-10-CM | POA: Diagnosis not present

## 2019-01-20 DIAGNOSIS — K30 Functional dyspepsia: Secondary | ICD-10-CM | POA: Diagnosis not present

## 2019-01-20 DIAGNOSIS — C3412 Malignant neoplasm of upper lobe, left bronchus or lung: Secondary | ICD-10-CM | POA: Diagnosis not present

## 2019-01-20 DIAGNOSIS — Z79899 Other long term (current) drug therapy: Secondary | ICD-10-CM | POA: Diagnosis not present

## 2019-01-20 DIAGNOSIS — Z5189 Encounter for other specified aftercare: Secondary | ICD-10-CM | POA: Diagnosis not present

## 2019-01-20 DIAGNOSIS — R61 Generalized hyperhidrosis: Secondary | ICD-10-CM | POA: Diagnosis not present

## 2019-01-20 LAB — COMPREHENSIVE METABOLIC PANEL
ALT: 14 U/L (ref 0–44)
AST: 17 U/L (ref 15–41)
Albumin: 3.7 g/dL (ref 3.5–5.0)
Alkaline Phosphatase: 64 U/L (ref 38–126)
Anion gap: 9 (ref 5–15)
BUN: 10 mg/dL (ref 8–23)
CO2: 27 mmol/L (ref 22–32)
Calcium: 8.9 mg/dL (ref 8.9–10.3)
Chloride: 105 mmol/L (ref 98–111)
Creatinine, Ser: 0.53 mg/dL (ref 0.44–1.00)
GFR calc Af Amer: >60 mL/min (ref >60)
GFR calc non Af Amer: >60 mL/min (ref >60)
Glucose, Bld: 144 mg/dL — ABNORMAL HIGH (ref 70–99)
Potassium: 3.6 mmol/L (ref 3.5–5.1)
SODIUM: 141 mmol/L (ref 135–145)
Total Bilirubin: 0.4 mg/dL (ref 0.3–1.2)
Total Protein: 7 g/dL (ref 6.5–8.1)

## 2019-01-20 LAB — CBC WITH DIFFERENTIAL/PLATELET
Abs Immature Granulocytes: 0.03 10*3/uL (ref 0.00–0.07)
Basophils Absolute: 0 10*3/uL (ref 0.0–0.1)
Basophils Relative: 1 %
Eosinophils Absolute: 0.1 10*3/uL (ref 0.0–0.5)
Eosinophils Relative: 1 %
HCT: 36.9 % (ref 36.0–46.0)
HEMOGLOBIN: 11 g/dL — AB (ref 12.0–15.0)
Immature Granulocytes: 0 %
LYMPHS PCT: 21 %
Lymphs Abs: 1.6 10*3/uL (ref 0.7–4.0)
MCH: 28.9 pg (ref 26.0–34.0)
MCHC: 29.8 g/dL — ABNORMAL LOW (ref 30.0–36.0)
MCV: 96.9 fL (ref 80.0–100.0)
Monocytes Absolute: 0.6 10*3/uL (ref 0.1–1.0)
Monocytes Relative: 7 %
Neutro Abs: 5.4 10*3/uL (ref 1.7–7.7)
Neutrophils Relative %: 70 %
Platelets: 333 10*3/uL (ref 150–400)
RBC: 3.81 MIL/uL — AB (ref 3.87–5.11)
RDW: 12.9 % (ref 11.5–15.5)
WBC: 7.8 10*3/uL (ref 4.0–10.5)
nRBC: 0 % (ref 0.0–0.2)

## 2019-01-20 LAB — MAGNESIUM: Magnesium: 1.8 mg/dL (ref 1.7–2.4)

## 2019-01-20 MED ORDER — HEPARIN SOD (PORK) LOCK FLUSH 100 UNIT/ML IV SOLN
500.0000 [IU] | Freq: Once | INTRAVENOUS | Status: AC | PRN
  Administered 2019-01-20: 500 [IU]
  Filled 2019-01-20: qty 5

## 2019-01-20 MED ORDER — SODIUM CHLORIDE 0.9 % IV SOLN
504.5000 mg | Freq: Once | INTRAVENOUS | Status: AC
  Administered 2019-01-20: 500 mg via INTRAVENOUS
  Filled 2019-01-20: qty 50

## 2019-01-20 MED ORDER — SODIUM CHLORIDE 0.9 % IV SOLN
10.0000 mg | Freq: Once | INTRAVENOUS | Status: AC
  Administered 2019-01-20: 10 mg via INTRAVENOUS
  Filled 2019-01-20: qty 1

## 2019-01-20 MED ORDER — SODIUM CHLORIDE 0.9 % IV SOLN
Freq: Once | INTRAVENOUS | Status: AC
  Administered 2019-01-20: 10:00:00 via INTRAVENOUS

## 2019-01-20 MED ORDER — PALONOSETRON HCL INJECTION 0.25 MG/5ML
0.2500 mg | Freq: Once | INTRAVENOUS | Status: AC
  Administered 2019-01-20: 0.25 mg via INTRAVENOUS
  Filled 2019-01-20: qty 5

## 2019-01-20 MED ORDER — SODIUM CHLORIDE 0.9 % IV SOLN
100.0000 mg/m2 | Freq: Once | INTRAVENOUS | Status: AC
  Administered 2019-01-20: 200 mg via INTRAVENOUS
  Filled 2019-01-20: qty 10

## 2019-01-20 MED ORDER — SODIUM CHLORIDE 0.9% FLUSH
10.0000 mL | INTRAVENOUS | Status: DC | PRN
  Administered 2019-01-20: 10 mL
  Filled 2019-01-20: qty 10

## 2019-01-20 NOTE — Progress Notes (Signed)
Labs reviewed by MD today. Proceed with day one today per MD.   Treatment given per orders. Patient tolerated it well without problems. Vitals stable and discharged home from clinic ambulatory. Follow up as scheduled.

## 2019-01-20 NOTE — Patient Instructions (Signed)
[facility] Discharge Instructions for Patients Receiving Chemotherapy  Today you received the following chemotherapy agents   To help prevent nausea and vomiting after your treatment, we encourage you to take your nausea medication   If you develop nausea and vomiting that is not controlled by your nausea medication, call the clinic.   BELOW ARE SYMPTOMS THAT SHOULD BE REPORTED IMMEDIATELY:  *FEVER GREATER THAN 100.5 F  *CHILLS WITH OR WITHOUT FEVER  NAUSEA AND VOMITING THAT IS NOT CONTROLLED WITH YOUR NAUSEA MEDICATION  *UNUSUAL SHORTNESS OF BREATH  *UNUSUAL BRUISING OR BLEEDING  TENDERNESS IN MOUTH AND THROAT WITH OR WITHOUT PRESENCE OF ULCERS  *URINARY PROBLEMS  *BOWEL PROBLEMS  UNUSUAL RASH Items with * indicate a potential emergency and should be followed up as soon as possible.  Feel free to call the clinic should you have any questions or concerns. The clinic phone number is [phone].  Please show the CHEMO ALERT CARD at check-in to the Emergency Department and triage nurse.   

## 2019-01-21 ENCOUNTER — Inpatient Hospital Stay (HOSPITAL_COMMUNITY): Payer: Medicare HMO | Attending: Hematology

## 2019-01-21 VITALS — BP 164/52 | HR 74 | Temp 98.3°F | Resp 18

## 2019-01-21 DIAGNOSIS — F329 Major depressive disorder, single episode, unspecified: Secondary | ICD-10-CM | POA: Diagnosis not present

## 2019-01-21 DIAGNOSIS — C3490 Malignant neoplasm of unspecified part of unspecified bronchus or lung: Secondary | ICD-10-CM

## 2019-01-21 DIAGNOSIS — I2510 Atherosclerotic heart disease of native coronary artery without angina pectoris: Secondary | ICD-10-CM | POA: Diagnosis not present

## 2019-01-21 DIAGNOSIS — Z5111 Encounter for antineoplastic chemotherapy: Secondary | ICD-10-CM | POA: Insufficient documentation

## 2019-01-21 DIAGNOSIS — Z483 Aftercare following surgery for neoplasm: Secondary | ICD-10-CM | POA: Diagnosis not present

## 2019-01-21 DIAGNOSIS — F419 Anxiety disorder, unspecified: Secondary | ICD-10-CM | POA: Diagnosis not present

## 2019-01-21 DIAGNOSIS — Z902 Acquired absence of lung [part of]: Secondary | ICD-10-CM | POA: Diagnosis not present

## 2019-01-21 DIAGNOSIS — I10 Essential (primary) hypertension: Secondary | ICD-10-CM | POA: Diagnosis not present

## 2019-01-21 DIAGNOSIS — M179 Osteoarthritis of knee, unspecified: Secondary | ICD-10-CM | POA: Diagnosis not present

## 2019-01-21 DIAGNOSIS — C3412 Malignant neoplasm of upper lobe, left bronchus or lung: Secondary | ICD-10-CM | POA: Diagnosis not present

## 2019-01-21 DIAGNOSIS — J449 Chronic obstructive pulmonary disease, unspecified: Secondary | ICD-10-CM | POA: Diagnosis not present

## 2019-01-21 MED ORDER — SODIUM CHLORIDE 0.9% FLUSH
10.0000 mL | INTRAVENOUS | Status: DC | PRN
  Administered 2019-01-21: 10 mL
  Filled 2019-01-21: qty 10

## 2019-01-21 MED ORDER — SODIUM CHLORIDE 0.9 % IV SOLN
10.0000 mg | Freq: Once | INTRAVENOUS | Status: AC
  Administered 2019-01-21: 10 mg via INTRAVENOUS
  Filled 2019-01-21: qty 1

## 2019-01-21 MED ORDER — HEPARIN SOD (PORK) LOCK FLUSH 100 UNIT/ML IV SOLN
500.0000 [IU] | Freq: Once | INTRAVENOUS | Status: AC | PRN
  Administered 2019-01-21: 500 [IU]

## 2019-01-21 MED ORDER — SODIUM CHLORIDE 0.9 % IV SOLN
Freq: Once | INTRAVENOUS | Status: AC
  Administered 2019-01-21: 13:00:00 via INTRAVENOUS

## 2019-01-21 MED ORDER — SODIUM CHLORIDE 0.9 % IV SOLN
100.0000 mg/m2 | Freq: Once | INTRAVENOUS | Status: AC
  Administered 2019-01-21: 200 mg via INTRAVENOUS
  Filled 2019-01-21: qty 10

## 2019-01-21 NOTE — Patient Instructions (Signed)
[facility] Discharge Instructions for Patients Receiving Chemotherapy   Beginning January 23rd 2017 lab work for the [facility] will be done in the  Main lab at [facility] on 1st floor. If you have a lab appointment with the [facility] please come in thru the  Main Entrance and check in at the main information desk   Today you received the following chemotherapy agents Etoposide  To help prevent nausea and vomiting after your treatment, we encourage you to take your nausea medication   If you develop nausea and vomiting, or diarrhea that is not controlled by your medication, call the clinic.  The clinic phone number is [phone]. Office hours are Monday-Friday 8:30am-5:00pm.  BELOW ARE SYMPTOMS THAT SHOULD BE REPORTED IMMEDIATELY:  *FEVER GREATER THAN 101.0 F  *CHILLS WITH OR WITHOUT FEVER  NAUSEA AND VOMITING THAT IS NOT CONTROLLED WITH YOUR NAUSEA MEDICATION  *UNUSUAL SHORTNESS OF BREATH  *UNUSUAL BRUISING OR BLEEDING  TENDERNESS IN MOUTH AND THROAT WITH OR WITHOUT PRESENCE OF ULCERS  *URINARY PROBLEMS  *BOWEL PROBLEMS  UNUSUAL RASH Items with * indicate a potential emergency and should be followed up as soon as possible. If you have an emergency after office hours please contact your primary care physician or go to the nearest emergency department.  Please call the clinic during office hours if you have any questions or concerns.   You may also contact the Patient Navigator at [phone] should you have any questions or need assistance in obtaining follow up care.      Resources For Cancer Patients and their Caregivers ? American Cancer Society: Can assist with transportation, wigs, general needs, runs Look Good Feel Better.        684 477 5183 ? Cancer Care: Provides financial assistance, online support groups, medication/co-pay assistance.  1-800-813-HOPE 410-610-3808) ? [place] Assists [place]  cancer patients and their families through emotional , educational and financial support.  810-590-0762 ? [place] DSS Where to apply for food stamps, Medicaid and utility assistance. 858-021-9946 ? [employer]: Transportation to medical appointments. (424)071-8722 ? Social Security Administration: May apply for disability if have a Stage IV cancer. 360-254-6198 (639)587-6810 ? [employer], Disability and Transit Services: Assists with nutrition, care and transit needs. 763-771-3591

## 2019-01-21 NOTE — Addendum Note (Signed)
Addendum  created 01/21/19 0846 by Vista Deck, CRNA   Intraprocedure Flowsheets edited

## 2019-01-21 NOTE — Progress Notes (Signed)
Laura Abbott tolerated treatment without incident or complaint. VSS. Discharged self ambulatory in satisfactory condition.

## 2019-01-22 ENCOUNTER — Inpatient Hospital Stay (HOSPITAL_COMMUNITY): Payer: Medicare HMO

## 2019-01-22 VITALS — BP 167/63 | HR 69 | Temp 97.5°F | Resp 16

## 2019-01-22 DIAGNOSIS — Z801 Family history of malignant neoplasm of trachea, bronchus and lung: Secondary | ICD-10-CM | POA: Diagnosis not present

## 2019-01-22 DIAGNOSIS — Z86000 Personal history of in-situ neoplasm of breast: Secondary | ICD-10-CM | POA: Diagnosis not present

## 2019-01-22 DIAGNOSIS — C3490 Malignant neoplasm of unspecified part of unspecified bronchus or lung: Secondary | ICD-10-CM

## 2019-01-22 DIAGNOSIS — Z5189 Encounter for other specified aftercare: Secondary | ICD-10-CM | POA: Diagnosis not present

## 2019-01-22 DIAGNOSIS — R61 Generalized hyperhidrosis: Secondary | ICD-10-CM | POA: Diagnosis not present

## 2019-01-22 DIAGNOSIS — Z87891 Personal history of nicotine dependence: Secondary | ICD-10-CM | POA: Diagnosis not present

## 2019-01-22 DIAGNOSIS — K30 Functional dyspepsia: Secondary | ICD-10-CM | POA: Diagnosis not present

## 2019-01-22 DIAGNOSIS — C3412 Malignant neoplasm of upper lobe, left bronchus or lung: Secondary | ICD-10-CM | POA: Diagnosis not present

## 2019-01-22 DIAGNOSIS — Z79899 Other long term (current) drug therapy: Secondary | ICD-10-CM | POA: Diagnosis not present

## 2019-01-22 DIAGNOSIS — Z5111 Encounter for antineoplastic chemotherapy: Secondary | ICD-10-CM | POA: Diagnosis not present

## 2019-01-22 MED ORDER — SODIUM CHLORIDE 0.9% FLUSH
10.0000 mL | INTRAVENOUS | Status: DC | PRN
  Administered 2019-01-22: 10 mL
  Filled 2019-01-22: qty 10

## 2019-01-22 MED ORDER — HEPARIN SOD (PORK) LOCK FLUSH 100 UNIT/ML IV SOLN
500.0000 [IU] | Freq: Once | INTRAVENOUS | Status: AC | PRN
  Administered 2019-01-22: 500 [IU]

## 2019-01-22 MED ORDER — SODIUM CHLORIDE 0.9 % IV SOLN
100.0000 mg/m2 | Freq: Once | INTRAVENOUS | Status: AC
  Administered 2019-01-22: 200 mg via INTRAVENOUS
  Filled 2019-01-22: qty 10

## 2019-01-22 MED ORDER — SODIUM CHLORIDE 0.9 % IV SOLN
10.0000 mg | Freq: Once | INTRAVENOUS | Status: AC
  Administered 2019-01-22: 10 mg via INTRAVENOUS
  Filled 2019-01-22: qty 1

## 2019-01-22 MED ORDER — SODIUM CHLORIDE 0.9 % IV SOLN
Freq: Once | INTRAVENOUS | Status: AC
  Administered 2019-01-22: 13:00:00 via INTRAVENOUS

## 2019-01-22 NOTE — Patient Instructions (Signed)
[facility] Discharge Instructions for Patients Receiving Chemotherapy  Today you received the following chemotherapy agents   To help prevent nausea and vomiting after your treatment, we encourage you to take your nausea medication   If you develop nausea and vomiting that is not controlled by your nausea medication, call the clinic.   BELOW ARE SYMPTOMS THAT SHOULD BE REPORTED IMMEDIATELY:  *FEVER GREATER THAN 100.5 F  *CHILLS WITH OR WITHOUT FEVER  NAUSEA AND VOMITING THAT IS NOT CONTROLLED WITH YOUR NAUSEA MEDICATION  *UNUSUAL SHORTNESS OF BREATH  *UNUSUAL BRUISING OR BLEEDING  TENDERNESS IN MOUTH AND THROAT WITH OR WITHOUT PRESENCE OF ULCERS  *URINARY PROBLEMS  *BOWEL PROBLEMS  UNUSUAL RASH Items with * indicate a potential emergency and should be followed up as soon as possible.  Feel free to call the clinic should you have any questions or concerns. The clinic phone number is [phone].  Please show the CHEMO ALERT CARD at check-in to the Emergency Department and triage nurse.   

## 2019-01-22 NOTE — Progress Notes (Signed)
Patient here today for day 3 of treatment. Only complaint today is her headache. BP is little elevated today. Patient stated she took some tylenol before she came.   Treatment given per orders. Patient tolerated it well without problems. Vitals stable and discharged home from clinic ambulatory. Follow up as scheduled.

## 2019-01-23 ENCOUNTER — Inpatient Hospital Stay (HOSPITAL_COMMUNITY): Payer: Medicare HMO

## 2019-01-23 ENCOUNTER — Encounter (HOSPITAL_COMMUNITY): Payer: Self-pay

## 2019-01-23 VITALS — BP 166/61 | HR 69 | Temp 98.2°F | Resp 18

## 2019-01-23 DIAGNOSIS — Z299 Encounter for prophylactic measures, unspecified: Secondary | ICD-10-CM | POA: Diagnosis not present

## 2019-01-23 DIAGNOSIS — C3490 Malignant neoplasm of unspecified part of unspecified bronchus or lung: Secondary | ICD-10-CM

## 2019-01-23 DIAGNOSIS — Z86000 Personal history of in-situ neoplasm of breast: Secondary | ICD-10-CM | POA: Diagnosis not present

## 2019-01-23 DIAGNOSIS — I10 Essential (primary) hypertension: Secondary | ICD-10-CM | POA: Diagnosis not present

## 2019-01-23 DIAGNOSIS — K30 Functional dyspepsia: Secondary | ICD-10-CM | POA: Diagnosis not present

## 2019-01-23 DIAGNOSIS — Z5189 Encounter for other specified aftercare: Secondary | ICD-10-CM | POA: Diagnosis not present

## 2019-01-23 DIAGNOSIS — J449 Chronic obstructive pulmonary disease, unspecified: Secondary | ICD-10-CM | POA: Diagnosis not present

## 2019-01-23 DIAGNOSIS — Z5111 Encounter for antineoplastic chemotherapy: Secondary | ICD-10-CM | POA: Diagnosis not present

## 2019-01-23 DIAGNOSIS — Z801 Family history of malignant neoplasm of trachea, bronchus and lung: Secondary | ICD-10-CM | POA: Diagnosis not present

## 2019-01-23 DIAGNOSIS — Z79899 Other long term (current) drug therapy: Secondary | ICD-10-CM | POA: Diagnosis not present

## 2019-01-23 DIAGNOSIS — R61 Generalized hyperhidrosis: Secondary | ICD-10-CM | POA: Diagnosis not present

## 2019-01-23 DIAGNOSIS — C3412 Malignant neoplasm of upper lobe, left bronchus or lung: Secondary | ICD-10-CM | POA: Diagnosis not present

## 2019-01-23 DIAGNOSIS — Z6839 Body mass index (BMI) 39.0-39.9, adult: Secondary | ICD-10-CM | POA: Diagnosis not present

## 2019-01-23 DIAGNOSIS — Z87891 Personal history of nicotine dependence: Secondary | ICD-10-CM | POA: Diagnosis not present

## 2019-01-23 MED ORDER — PEGFILGRASTIM-CBQV 6 MG/0.6ML ~~LOC~~ SOSY
PREFILLED_SYRINGE | SUBCUTANEOUS | Status: AC
  Filled 2019-01-23: qty 0.6

## 2019-01-23 MED ORDER — PEGFILGRASTIM-CBQV 6 MG/0.6ML ~~LOC~~ SOSY
6.0000 mg | PREFILLED_SYRINGE | Freq: Once | SUBCUTANEOUS | Status: AC
  Administered 2019-01-23: 6 mg via SUBCUTANEOUS

## 2019-01-23 NOTE — Progress Notes (Signed)
Laura Abbott presents today for injection per MD orders. Udenyca 6 mg administered SQ in right Upper Arm. Administration without incident. Patient tolerated well.  Vitals stable and discharged home from clinic ambulatory. Follow up as scheduled.  24 hour follow up -patient complaining of heartburn only, little nausea at times, no vomiting.

## 2019-01-23 NOTE — Patient Instructions (Signed)
[facility]  Discharge Instructions:   _______________________________________________________________  Thank you for choosing [facility] at [facility] to provide your oncology and hematology care.  To afford each patient quality time with our providers, please arrive at least 15 minutes before your scheduled appointment.  You need to re-schedule your appointment if you arrive 10 or more minutes late.  We strive to give you quality time with our providers, and arriving late affects you and other patients whose appointments are after yours.  Also, if you no show three or more times for appointments you may be dismissed from the clinic.  Again, thank you for choosing [facility] at [facility]. Our hope is that these requests will allow you access to exceptional care and in a timely manner. _______________________________________________________________  If you have questions after your visit, please contact our office at [phone] between the hours of 8:30 a.m. and 5:00 p.m. Voicemails left after 4:30 p.m. will not be returned until the following business day. _______________________________________________________________  For prescription refill requests, have your pharmacy contact our office. _______________________________________________________________  Recommendations made by the consultant and any test results will be sent to your referring physician. _______________________________________________________________ 

## 2019-01-27 DIAGNOSIS — I10 Essential (primary) hypertension: Secondary | ICD-10-CM | POA: Diagnosis not present

## 2019-01-27 DIAGNOSIS — F329 Major depressive disorder, single episode, unspecified: Secondary | ICD-10-CM | POA: Diagnosis not present

## 2019-01-27 DIAGNOSIS — F419 Anxiety disorder, unspecified: Secondary | ICD-10-CM | POA: Diagnosis not present

## 2019-01-27 DIAGNOSIS — Z483 Aftercare following surgery for neoplasm: Secondary | ICD-10-CM | POA: Diagnosis not present

## 2019-01-27 DIAGNOSIS — I2510 Atherosclerotic heart disease of native coronary artery without angina pectoris: Secondary | ICD-10-CM | POA: Diagnosis not present

## 2019-01-27 DIAGNOSIS — C3412 Malignant neoplasm of upper lobe, left bronchus or lung: Secondary | ICD-10-CM | POA: Diagnosis not present

## 2019-01-27 DIAGNOSIS — M179 Osteoarthritis of knee, unspecified: Secondary | ICD-10-CM | POA: Diagnosis not present

## 2019-01-27 DIAGNOSIS — J449 Chronic obstructive pulmonary disease, unspecified: Secondary | ICD-10-CM | POA: Diagnosis not present

## 2019-01-27 DIAGNOSIS — Z902 Acquired absence of lung [part of]: Secondary | ICD-10-CM | POA: Diagnosis not present

## 2019-01-28 DIAGNOSIS — J441 Chronic obstructive pulmonary disease with (acute) exacerbation: Secondary | ICD-10-CM | POA: Diagnosis not present

## 2019-01-30 ENCOUNTER — Encounter (HOSPITAL_COMMUNITY): Payer: Self-pay | Admitting: *Deleted

## 2019-01-30 NOTE — Progress Notes (Signed)
Patient called today stating that she was having some pain in her lower back and hips. She states that it started last night.  I explained to her that sometimes patients can experience some bone pain after neulasta injections.  I advised her to try taking claritin daily for a week to see if that helps the pain. I also told her that she could take some Tylenol for the discomfort.  She verbalizes understanding and states that she will call me back next week if that doesn't help her.

## 2019-02-03 DIAGNOSIS — Z483 Aftercare following surgery for neoplasm: Secondary | ICD-10-CM | POA: Diagnosis not present

## 2019-02-03 DIAGNOSIS — J449 Chronic obstructive pulmonary disease, unspecified: Secondary | ICD-10-CM | POA: Diagnosis not present

## 2019-02-03 DIAGNOSIS — I2510 Atherosclerotic heart disease of native coronary artery without angina pectoris: Secondary | ICD-10-CM | POA: Diagnosis not present

## 2019-02-03 DIAGNOSIS — F329 Major depressive disorder, single episode, unspecified: Secondary | ICD-10-CM | POA: Diagnosis not present

## 2019-02-03 DIAGNOSIS — I10 Essential (primary) hypertension: Secondary | ICD-10-CM | POA: Diagnosis not present

## 2019-02-03 DIAGNOSIS — F419 Anxiety disorder, unspecified: Secondary | ICD-10-CM | POA: Diagnosis not present

## 2019-02-03 DIAGNOSIS — C3412 Malignant neoplasm of upper lobe, left bronchus or lung: Secondary | ICD-10-CM | POA: Diagnosis not present

## 2019-02-03 DIAGNOSIS — M179 Osteoarthritis of knee, unspecified: Secondary | ICD-10-CM | POA: Diagnosis not present

## 2019-02-03 DIAGNOSIS — Z902 Acquired absence of lung [part of]: Secondary | ICD-10-CM | POA: Diagnosis not present

## 2019-02-06 ENCOUNTER — Other Ambulatory Visit (HOSPITAL_COMMUNITY): Payer: Self-pay | Admitting: *Deleted

## 2019-02-06 MED ORDER — OXYCODONE HCL 5 MG PO TABS: 5.0000 mg | ORAL_TABLET | Freq: Four times a day (QID) | ORAL | 0 refills | Status: DC | PRN

## 2019-02-09 DIAGNOSIS — I2510 Atherosclerotic heart disease of native coronary artery without angina pectoris: Secondary | ICD-10-CM | POA: Diagnosis not present

## 2019-02-09 DIAGNOSIS — J449 Chronic obstructive pulmonary disease, unspecified: Secondary | ICD-10-CM | POA: Diagnosis not present

## 2019-02-09 DIAGNOSIS — M179 Osteoarthritis of knee, unspecified: Secondary | ICD-10-CM | POA: Diagnosis not present

## 2019-02-09 DIAGNOSIS — Z483 Aftercare following surgery for neoplasm: Secondary | ICD-10-CM | POA: Diagnosis not present

## 2019-02-09 DIAGNOSIS — F329 Major depressive disorder, single episode, unspecified: Secondary | ICD-10-CM | POA: Diagnosis not present

## 2019-02-09 DIAGNOSIS — C3412 Malignant neoplasm of upper lobe, left bronchus or lung: Secondary | ICD-10-CM | POA: Diagnosis not present

## 2019-02-09 DIAGNOSIS — Z902 Acquired absence of lung [part of]: Secondary | ICD-10-CM | POA: Diagnosis not present

## 2019-02-09 DIAGNOSIS — I10 Essential (primary) hypertension: Secondary | ICD-10-CM | POA: Diagnosis not present

## 2019-02-09 DIAGNOSIS — F419 Anxiety disorder, unspecified: Secondary | ICD-10-CM | POA: Diagnosis not present

## 2019-02-10 ENCOUNTER — Inpatient Hospital Stay (HOSPITAL_COMMUNITY): Payer: Medicare HMO | Attending: Hematology

## 2019-02-10 ENCOUNTER — Other Ambulatory Visit: Payer: Self-pay

## 2019-02-10 ENCOUNTER — Ambulatory Visit: Payer: Medicare HMO | Admitting: Thoracic Surgery (Cardiothoracic Vascular Surgery)

## 2019-02-10 ENCOUNTER — Inpatient Hospital Stay (HOSPITAL_COMMUNITY): Payer: Medicare HMO

## 2019-02-10 VITALS — BP 151/60 | HR 77 | Temp 98.2°F | Resp 18 | Wt 204.6 lb

## 2019-02-10 DIAGNOSIS — C3412 Malignant neoplasm of upper lobe, left bronchus or lung: Secondary | ICD-10-CM | POA: Insufficient documentation

## 2019-02-10 DIAGNOSIS — Z5111 Encounter for antineoplastic chemotherapy: Secondary | ICD-10-CM | POA: Diagnosis not present

## 2019-02-10 DIAGNOSIS — Z800 Family history of malignant neoplasm of digestive organs: Secondary | ICD-10-CM | POA: Insufficient documentation

## 2019-02-10 DIAGNOSIS — C3490 Malignant neoplasm of unspecified part of unspecified bronchus or lung: Secondary | ICD-10-CM

## 2019-02-10 DIAGNOSIS — Z87891 Personal history of nicotine dependence: Secondary | ICD-10-CM | POA: Diagnosis not present

## 2019-02-10 DIAGNOSIS — Z79899 Other long term (current) drug therapy: Secondary | ICD-10-CM | POA: Insufficient documentation

## 2019-02-10 DIAGNOSIS — R5383 Other fatigue: Secondary | ICD-10-CM | POA: Insufficient documentation

## 2019-02-10 DIAGNOSIS — Z801 Family history of malignant neoplasm of trachea, bronchus and lung: Secondary | ICD-10-CM | POA: Diagnosis not present

## 2019-02-10 DIAGNOSIS — Z8249 Family history of ischemic heart disease and other diseases of the circulatory system: Secondary | ICD-10-CM | POA: Insufficient documentation

## 2019-02-10 DIAGNOSIS — Z86000 Personal history of in-situ neoplasm of breast: Secondary | ICD-10-CM | POA: Insufficient documentation

## 2019-02-10 DIAGNOSIS — Z806 Family history of leukemia: Secondary | ICD-10-CM | POA: Insufficient documentation

## 2019-02-10 DIAGNOSIS — Z803 Family history of malignant neoplasm of breast: Secondary | ICD-10-CM | POA: Insufficient documentation

## 2019-02-10 DIAGNOSIS — Z5189 Encounter for other specified aftercare: Secondary | ICD-10-CM | POA: Diagnosis present

## 2019-02-10 LAB — CBC WITH DIFFERENTIAL/PLATELET
Abs Immature Granulocytes: 0.04 10*3/uL (ref 0.00–0.07)
Basophils Absolute: 0.1 10*3/uL (ref 0.0–0.1)
Basophils Relative: 1 %
Eosinophils Absolute: 0 10*3/uL (ref 0.0–0.5)
Eosinophils Relative: 0 %
HCT: 36.8 % (ref 36.0–46.0)
Hemoglobin: 11 g/dL — ABNORMAL LOW (ref 12.0–15.0)
Immature Granulocytes: 1 %
Lymphocytes Relative: 21 %
Lymphs Abs: 1.8 10*3/uL (ref 0.7–4.0)
MCH: 28.5 pg (ref 26.0–34.0)
MCHC: 29.9 g/dL — ABNORMAL LOW (ref 30.0–36.0)
MCV: 95.3 fL (ref 80.0–100.0)
Monocytes Absolute: 0.9 10*3/uL (ref 0.1–1.0)
Monocytes Relative: 10 %
Neutro Abs: 5.8 10*3/uL (ref 1.7–7.7)
Neutrophils Relative %: 67 %
Platelets: 563 10*3/uL — ABNORMAL HIGH (ref 150–400)
RBC: 3.86 MIL/uL — ABNORMAL LOW (ref 3.87–5.11)
RDW: 14.4 % (ref 11.5–15.5)
WBC: 8.5 10*3/uL (ref 4.0–10.5)
nRBC: 0 % (ref 0.0–0.2)

## 2019-02-10 LAB — COMPREHENSIVE METABOLIC PANEL
ALT: 15 U/L (ref 0–44)
AST: 20 U/L (ref 15–41)
Albumin: 3.8 g/dL (ref 3.5–5.0)
Alkaline Phosphatase: 68 U/L (ref 38–126)
Anion gap: 10 (ref 5–15)
BUN: 9 mg/dL (ref 8–23)
CO2: 27 mmol/L (ref 22–32)
Calcium: 9 mg/dL (ref 8.9–10.3)
Chloride: 103 mmol/L (ref 98–111)
Creatinine, Ser: 0.48 mg/dL (ref 0.44–1.00)
GFR calc Af Amer: >60 mL/min (ref >60)
GFR calc non Af Amer: >60 mL/min (ref >60)
GLUCOSE: 122 mg/dL — AB (ref 70–99)
Potassium: 4.1 mmol/L (ref 3.5–5.1)
SODIUM: 140 mmol/L (ref 135–145)
Total Bilirubin: 0.5 mg/dL (ref 0.3–1.2)
Total Protein: 7.7 g/dL (ref 6.5–8.1)

## 2019-02-10 LAB — MAGNESIUM: Magnesium: 1.9 mg/dL (ref 1.7–2.4)

## 2019-02-10 MED ORDER — SODIUM CHLORIDE 0.9 % IV SOLN
10.0000 mg | Freq: Once | INTRAVENOUS | Status: AC
  Administered 2019-02-10: 10 mg via INTRAVENOUS
  Filled 2019-02-10: qty 1

## 2019-02-10 MED ORDER — SODIUM CHLORIDE 0.9% FLUSH
10.0000 mL | INTRAVENOUS | Status: DC | PRN
  Administered 2019-02-10 (×2): 10 mL
  Filled 2019-02-10 (×2): qty 10

## 2019-02-10 MED ORDER — SODIUM CHLORIDE 0.9 % IV SOLN
Freq: Once | INTRAVENOUS | Status: AC
  Administered 2019-02-10: 12:00:00 via INTRAVENOUS

## 2019-02-10 MED ORDER — SODIUM CHLORIDE 0.9 % IV SOLN
504.5000 mg | Freq: Once | INTRAVENOUS | Status: AC
  Administered 2019-02-10: 500 mg via INTRAVENOUS
  Filled 2019-02-10: qty 50

## 2019-02-10 MED ORDER — SODIUM CHLORIDE 0.9 % IV SOLN
100.0000 mg/m2 | Freq: Once | INTRAVENOUS | Status: AC
  Administered 2019-02-10: 200 mg via INTRAVENOUS
  Filled 2019-02-10: qty 10

## 2019-02-10 MED ORDER — PALONOSETRON HCL INJECTION 0.25 MG/5ML
0.2500 mg | Freq: Once | INTRAVENOUS | Status: AC
  Administered 2019-02-10: 0.25 mg via INTRAVENOUS
  Filled 2019-02-10: qty 5

## 2019-02-10 MED ORDER — HEPARIN SOD (PORK) LOCK FLUSH 100 UNIT/ML IV SOLN
500.0000 [IU] | Freq: Once | INTRAVENOUS | Status: AC | PRN
  Administered 2019-02-10: 500 [IU]
  Filled 2019-02-10: qty 5

## 2019-02-10 NOTE — Progress Notes (Unsigned)
Labs reviewed with MD today, no new issues reported by patient. Will proceed with treatment today per MD  Treatment given per orders. Patient tolerated it well without problems. Vitals stable and discharged home from clinic ambulatory. Follow up as scheduled.

## 2019-02-11 ENCOUNTER — Ambulatory Visit (HOSPITAL_COMMUNITY): Payer: Self-pay

## 2019-02-11 ENCOUNTER — Inpatient Hospital Stay (HOSPITAL_COMMUNITY): Payer: Medicare HMO | Attending: Hematology | Admitting: Hematology

## 2019-02-11 ENCOUNTER — Other Ambulatory Visit (HOSPITAL_COMMUNITY): Payer: Self-pay

## 2019-02-11 ENCOUNTER — Encounter (HOSPITAL_COMMUNITY): Payer: Self-pay | Admitting: Hematology

## 2019-02-11 ENCOUNTER — Inpatient Hospital Stay (HOSPITAL_COMMUNITY): Payer: Medicare HMO

## 2019-02-11 VITALS — BP 181/66 | HR 73 | Temp 98.6°F | Resp 16

## 2019-02-11 DIAGNOSIS — Z8249 Family history of ischemic heart disease and other diseases of the circulatory system: Secondary | ICD-10-CM

## 2019-02-11 DIAGNOSIS — Z803 Family history of malignant neoplasm of breast: Secondary | ICD-10-CM

## 2019-02-11 DIAGNOSIS — R5383 Other fatigue: Secondary | ICD-10-CM

## 2019-02-11 DIAGNOSIS — Z79899 Other long term (current) drug therapy: Secondary | ICD-10-CM | POA: Diagnosis not present

## 2019-02-11 DIAGNOSIS — Z806 Family history of leukemia: Secondary | ICD-10-CM

## 2019-02-11 DIAGNOSIS — Z86000 Personal history of in-situ neoplasm of breast: Secondary | ICD-10-CM | POA: Diagnosis not present

## 2019-02-11 DIAGNOSIS — C3490 Malignant neoplasm of unspecified part of unspecified bronchus or lung: Secondary | ICD-10-CM

## 2019-02-11 DIAGNOSIS — C3412 Malignant neoplasm of upper lobe, left bronchus or lung: Secondary | ICD-10-CM

## 2019-02-11 DIAGNOSIS — Z801 Family history of malignant neoplasm of trachea, bronchus and lung: Secondary | ICD-10-CM

## 2019-02-11 DIAGNOSIS — Z800 Family history of malignant neoplasm of digestive organs: Secondary | ICD-10-CM

## 2019-02-11 DIAGNOSIS — Z87891 Personal history of nicotine dependence: Secondary | ICD-10-CM | POA: Diagnosis not present

## 2019-02-11 DIAGNOSIS — Z5111 Encounter for antineoplastic chemotherapy: Secondary | ICD-10-CM | POA: Diagnosis not present

## 2019-02-11 MED ORDER — SODIUM CHLORIDE 0.9 % IV SOLN
Freq: Once | INTRAVENOUS | Status: AC
  Administered 2019-02-11: 11:00:00 via INTRAVENOUS

## 2019-02-11 MED ORDER — HEPARIN SOD (PORK) LOCK FLUSH 100 UNIT/ML IV SOLN
500.0000 [IU] | Freq: Once | INTRAVENOUS | Status: AC | PRN
  Administered 2019-02-11: 500 [IU]

## 2019-02-11 MED ORDER — SODIUM CHLORIDE 0.9 % IV SOLN
10.0000 mg | Freq: Once | INTRAVENOUS | Status: AC
  Administered 2019-02-11: 10 mg via INTRAVENOUS
  Filled 2019-02-11: qty 1

## 2019-02-11 MED ORDER — SODIUM CHLORIDE 0.9 % IV SOLN
100.0000 mg/m2 | Freq: Once | INTRAVENOUS | Status: AC
  Administered 2019-02-11: 200 mg via INTRAVENOUS
  Filled 2019-02-11: qty 10

## 2019-02-11 MED ORDER — SODIUM CHLORIDE 0.9% FLUSH
10.0000 mL | INTRAVENOUS | Status: DC | PRN
  Administered 2019-02-11: 10 mL
  Filled 2019-02-11: qty 10

## 2019-02-11 NOTE — Progress Notes (Signed)
Day 2 proceed with treatment today per MD.  Treatment given per orders. Patient tolerated it well without problems. Vitals stable and discharged home from clinic ambulatory. Follow up as scheduled.

## 2019-02-11 NOTE — Progress Notes (Signed)
° °[facility] °[address] °[place], [place] [address] ° ° °CLINIC:  °Medical Oncology/Hematology ° °PCP:  °Vyas, Dhruv B, MD °[address] °[place] [address] [phone] ° ° °REASON FOR VISIT:  °Follow-up for neuroendocrine lung cancer °  °BRIEF ONCOLOGIC HISTORY:  °  °DCIS (ductal carcinoma in situ) of breast  ° 10/28/2013 Initial Diagnosis  °  DCIS (ductal carcinoma in situ) of breast °  ° 12/21/2018 Genetic Testing  °  Negative genetic testing on the multicancer panel.  The Multi-Gene Panel offered by [facility] includes sequencing and/or deletion duplication testing of the following 84 genes: AIP, ALK, APC, ATM, AXIN2,BAP1,  BARD1, BLM, BMPR1A, BRCA1, BRCA2, BRIP1, CASR, CDC73, CDH1, CDK4, CDKN1B, CDKN1C, CDKN2A (p14ARF), CDKN2A (p16INK4a), CEBPA, CHEK2, CTNNA1, DICER1, DIS3L2, EGFR (c.2369C>T, p.Thr790Met variant only), EPCAM (Deletion/duplication testing only), FH, FLCN, GATA2, GPC3, GREM1 (Promoter region deletion/duplication testing only), HOXB13 (c.251G>A, p.Gly84Glu), HRAS, KIT, MAX, MEN1, MET, MITF (c.952G>A, p.Glu318Lys variant only), MLH1, MSH2, MSH3, MSH6, MUTYH, NBN, NF1, NF2, NTHL1, PALB2, PDGFRA, PHOX2B, PMS2, POLD1, POLE, POT1, PRKAR1A, PTCH1, PTEN, RAD50, RAD51C, RAD51D, RB1, RECQL4, RET, RUNX1, SDHAF2, SDHA (sequence changes only), SDHB, SDHC, SDHD, SMAD4, SMARCA4, SMARCB1, SMARCE1, STK11, SUFU, TERC, TERT, TMEM127, TP53, TSC1, TSC2, VHL, WRN and WT1.  The report date is 12/21/2018. °  °  °Lung cancer (HCC)  ° 12/15/2018 Initial Diagnosis  °  Lung cancer (HCC) °  ° 12/21/2018 Genetic Testing  °  Negative genetic testing on the multicancer panel.  The Multi-Gene Panel offered by [facility] includes sequencing and/or deletion duplication testing of the following 84 genes: AIP, ALK, APC, ATM, AXIN2,BAP1,  BARD1, BLM, BMPR1A, BRCA1, BRCA2, BRIP1, CASR, CDC73, CDH1, CDK4, CDKN1B, CDKN1C, CDKN2A (p14ARF), CDKN2A (p16INK4a), CEBPA, CHEK2, CTNNA1, DICER1, DIS3L2, EGFR (c.2369C>T, p.Thr790Met variant  only), EPCAM (Deletion/duplication testing only), FH, FLCN, GATA2, GPC3, GREM1 (Promoter region deletion/duplication testing only), HOXB13 (c.251G>A, p.Gly84Glu), HRAS, KIT, MAX, MEN1, MET, MITF (c.952G>A, p.Glu318Lys variant only), MLH1, MSH2, MSH3, MSH6, MUTYH, NBN, NF1, NF2, NTHL1, PALB2, PDGFRA, PHOX2B, PMS2, POLD1, POLE, POT1, PRKAR1A, PTCH1, PTEN, RAD50, RAD51C, RAD51D, RB1, RECQL4, RET, RUNX1, SDHAF2, SDHA (sequence changes only), SDHB, SDHC, SDHD, SMAD4, SMARCA4, SMARCB1, SMARCE1, STK11, SUFU, TERC, TERT, TMEM127, TP53, TSC1, TSC2, VHL, WRN and WT1.  The report date is 12/21/2018. °  °  °Primary spindle cell carcinoma of lung (HCC)  ° 12/20/2018 Initial Diagnosis  °  Primary spindle cell carcinoma of lung (HCC) °  ° 12/21/2018 Genetic Testing  °  Negative genetic testing on the multicancer panel.  The Multi-Gene Panel offered by [facility] includes sequencing and/or deletion duplication testing of the following 84 genes: AIP, ALK, APC, ATM, AXIN2,BAP1,  BARD1, BLM, BMPR1A, BRCA1, BRCA2, BRIP1, CASR, CDC73, CDH1, CDK4, CDKN1B, CDKN1C, CDKN2A (p14ARF), CDKN2A (p16INK4a), CEBPA, CHEK2, CTNNA1, DICER1, DIS3L2, EGFR (c.2369C>T, p.Thr790Met variant only), EPCAM (Deletion/duplication testing only), FH, FLCN, GATA2, GPC3, GREM1 (Promoter region deletion/duplication testing only), HOXB13 (c.251G>A, p.Gly84Glu), HRAS, KIT, MAX, MEN1, MET, MITF (c.952G>A, p.Glu318Lys variant only), MLH1, MSH2, MSH3, MSH6, MUTYH, NBN, NF1, NF2, NTHL1, PALB2, PDGFRA, PHOX2B, PMS2, POLD1, POLE, POT1, PRKAR1A, PTCH1, PTEN, RAD50, RAD51C, RAD51D, RB1, RECQL4, RET, RUNX1, SDHAF2, SDHA (sequence changes only), SDHB, SDHC, SDHD, SMAD4, SMARCA4, SMARCB1, SMARCE1, STK11, SUFU, TERC, TERT, TMEM127, TP53, TSC1, TSC2, VHL, WRN and WT1.  The report date is 12/21/2018. °  °  °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HCC)  ° 01/12/2019 Initial Diagnosis  °  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HCC) °  ° 01/20/2019 -  Chemotherapy  °  The patient   patient had palonosetron (ALOXI) injection 0.25 mg,  0.25 mg, Intravenous,  Once, 2 of 4 cycles Administration: 0.25 mg (01/20/2019), 0.25 mg (02/10/2019) pegfilgrastim-cbqv (UDENYCA) injection 6 mg, 6 mg, Subcutaneous, Once, 2 of 4 cycles Administration: 6 mg (01/23/2019) CARBOplatin (PARAPLATIN) 500 mg in sodium chloride 0.9 % 250 mL chemo infusion, 500 mg (100 % of original dose 504.5 mg), Intravenous,  Once, 2 of 4 cycles Dose modification:   (original dose 504.5 mg, Cycle 1),   (original dose 504.5 mg, Cycle 2) Administration: 500 mg (01/20/2019), 500 mg (02/10/2019) etoposide (VEPESID) 200 mg in sodium chloride 0.9 % 500 mL chemo infusion, 100 mg/m2 = 200 mg, Intravenous,  Once, 2 of 4 cycles Administration: 200 mg (01/20/2019), 200 mg (01/21/2019), 200 mg (01/22/2019), 200 mg (02/10/2019), 200 mg (02/11/2019)  for chemotherapy treatment.       CANCER STAGING: Cancer Staging DCIS (ductal carcinoma in situ) of breast Staging form: Breast, AJCC 7th Edition - Clinical stage from 12/01/2013: Stage 0 (Tis (DCIS), N0, cM0) - Signed by Heath Lark, MD on 12/01/2013 - Pathologic: Stage 0 (Tis (DCIS), N0, cM0) - Signed by Heath Lark, MD on 12/01/2013    INTERVAL HISTORY:  Ms. Manansala 73 y.o. female returns for routine follow-up and consideration for next cycle of chemotherapy. She is here today alone. She states that she was very tired and her legs were like jello after her last treatment. She is feeling a little better today. She states that her left breast is still tender and sore. She states that her hair started falling out a few days after her treatment so she shaved it off.  Denies any nausea, vomiting, or diarrhea. Denies any new pains. Had not noticed any recent bleeding such as epistaxis, hematuria or hematochezia. Denies recent chest pain on exertion, shortness of breath on minimal exertion, pre-syncopal episodes, or palpitations. Denies any numbness or tingling in hands or feet. Denies any recent fevers, infections, or recent hospitalizations. Patient  reports appetite at 0% and energy level at 50%.   REVIEW OF SYSTEMS:  Review of Systems  Constitutional: Positive for fatigue.  All other systems reviewed and are negative.    PAST MEDICAL/SURGICAL HISTORY:  Past Medical History:  Diagnosis Date   Anxiety    Arthritis    Asthma    Breast cancer ([place])    breast, right DCIS   COPD (chronic obstructive pulmonary disease) ([place])    COPD exacerbation ([place]) 01/03/2014   With hypoxia   Depression    Emphysema of lung ([place])    Family history of breast cancer    Family history of colon cancer    Family history of kidney cancer    Family history of lung cancer    Family history of pancreatic cancer    GERD (gastroesophageal reflux disease)    History of kidney stones    Hyperlipidemia    Hypertension    Obesity 01/05/2014   Osteoporosis    Osteoporosis, unspecified 12/01/2013   Pneumonia    x2   Tobacco abuse 12/01/2013   Past Surgical History:  Procedure Laterality Date   ABDOMINAL HYSTERECTOMY     BLADDER SURGERY     had tacked   BREAST LUMPECTOMY WITH NEEDLE LOCALIZATION AND AXILLARY SENTINEL LYMPH NODE BX Right 11/09/2013   Procedure: BREAST LUMPECTOMY WITH NEEDLE LOCALIZATION AND AXILLARY SENTINEL LYMPH NODE BIOPSY;  Surgeon: Harl Bowie, MD;  Location: [facility];  Service: General;  Laterality: Right;   BREAST SURGERY Right    lumpectomy  CHOLECYSTECTOMY    °• NODE DISSECTION Left 12/15/2018  ° Procedure: NODE DISSECTION LEFT LUNG;  Surgeon: Hendrickson, Steven C, MD;  Location: [facility];  Service: Thoracic;  Laterality: Left;  °• PORTACATH PLACEMENT Right 01/19/2019  ° Procedure: INSERTION PORT-A-CATH;  Surgeon: Jenkins, Mark, MD;  Location: [facility];  Service: General;  Laterality: Right;  °• VIDEO ASSISTED THORACOSCOPY (VATS)/ LOBECTOMY Left 12/15/2018  ° Procedure: VIDEO ASSISTED THORACOSCOPY (VATS)/LEFT UPPER LOBECTOMY;  Surgeon: Hendrickson, Steven C, MD;  Location: [facility];  Service: Thoracic;   Laterality: Left;  ° ° ° °SOCIAL HISTORY:  °Social History  ° °Socioeconomic History  °• Marital status: Married  °  Spouse name: Not on file  °• Number of children: 3  °• Years of education: Not on file  °• Highest education level: Not on file  °Occupational History  °• Occupation: Raw yarn worker  °  Comment: [employer]  °  Comment: [employer]  °  Comment: [employer]  °Social Needs  °• Financial resource strain: Not hard at all  °• Food insecurity:  °  Worry: Never true  °  Inability: Never true  °• Transportation needs:  °  Medical: No  °  Non-medical: No  °Tobacco Use  °• Smoking status: Former Smoker  °  Packs/day: 0.50  °  Years: 43.00  °  Pack years: 21.50  °  Types: Cigarettes  °  Last attempt to quit: 10/24/2018  °  Years since quitting: 0.3  °• Smokeless tobacco: Never Used  °Substance and Sexual Activity  °• Alcohol use: No  °• Drug use: No  °• Sexual activity: Not Currently  °Lifestyle  °• Physical activity:  °  Days per week: 0 days  °  Minutes per session: 0 min  °• Stress: Rather much  °Relationships  °• Social connections:  °  Talks on phone: More than three times a week  °  Gets together: More than three times a week  °  Attends religious service: More than 4 times per year  °  Active member of club or organization: Yes  °  Attends meetings of clubs or organizations: More than 4 times per year  °  Relationship status: Married  °• Intimate partner violence:  °  Fear of current or ex partner: No  °  Emotionally abused: No  °  Physically abused: No  °  Forced sexual activity: No  °Other Topics Concern  °• Not on file  °Social History Narrative  °• Not on file  ° ° °FAMILY HISTORY:  °Family History  °Problem Relation Age of Onset  °• Lung cancer Mother 76  °     non smoker  °• Pancreatic cancer Father 65  °     d. 67  °• Leukemia Sister   °     dx in her 60s  °• Heart disease Sister   °• Colon cancer Sister   °     dx and died in her 60s  °• Heart disease Sister   °• Lung cancer Maternal Aunt   °      non-smoker  °• Heart disease Sister   °• Breast cancer Sister   °     dx early 60s  °• Heart disease Brother   °• Cirrhosis Brother   °     d. 36  °• Aneurysm Brother   °     d. 60s  °• Fibromyalgia Daughter   °• Kidney cancer Nephew 71  °• Kidney   cancer Nephew        dx in 85s; great nephew   Leukemia Nephew    Liver disease Nephew    Other Niece        white matter on brain; dx late 2s    CURRENT MEDICATIONS:  Outpatient Encounter Medications as of 02/11/2019  Medication Sig   albuterol (PROVENTIL HFA;VENTOLIN HFA) 108 (90 BASE) MCG/ACT inhaler Inhale 2 puffs into the lungs 3 (three) times daily. Also use the inhaler every 2 hours if needed for worsening shortness of breath and wheezing. (Patient taking differently: Inhale 2 puffs into the lungs 3 (three) times daily as needed for wheezing or shortness of breath. )   Calcium 600-200 MG-UNIT per tablet Take 1 tablet by mouth daily.   CARBOPLATIN IV Inject into the vein every 21 ( twenty-one) days.   clonazePAM (KLONOPIN) 0.5 MG tablet Take 0.5 mg by mouth at bedtime.    ETOPOSIDE IV Inject into the vein every 21 ( twenty-one) days.   FLUoxetine (PROZAC) 40 MG capsule Take 40 mg by mouth daily.   fluticasone (FLONASE) 50 MCG/ACT nasal spray Place 2 sprays into both nostrils daily.   fluticasone furoate-vilanterol (BREO ELLIPTA) 100-25 MCG/INH AEPB Inhale 1 puff into the lungs daily.   lidocaine-prilocaine (EMLA) cream Apply 1 application topically as needed. Apply pea sized amount to port site 1 hour prior to appointment and cover with plastic wrap   lisinopril (PRINIVIL,ZESTRIL) 10 MG tablet Take 10 mg by mouth daily.   metoprolol (LOPRESSOR) 50 MG tablet Take 50-75 mg by mouth 2 (two) times daily. Takes 75 mg in the morning and 50 mg in the evening   nabumetone (RELAFEN) 500 MG tablet Take 500 mg by mouth every morning.   omeprazole (PRILOSEC) 40 MG capsule Take 40 mg by mouth daily.    oxyCODONE (OXY IR/ROXICODONE) 5 MG  immediate release tablet Take 1 tablet (5 mg total) by mouth every 6 (six) hours as needed for severe pain.   prochlorperazine (COMPAZINE) 10 MG tablet Take 1 tablet (10 mg total) by mouth every 6 (six) hours as needed for nausea or vomiting.   traZODone (DESYREL) 50 MG tablet Take 50 mg by mouth at bedtime.   Facility-Administered Encounter Medications as of 02/11/2019  Medication   [COMPLETED] CARBOplatin (PARAPLATIN) 500 mg in sodium chloride 0.9 % 250 mL chemo infusion   [COMPLETED] etoposide (VEPESID) 200 mg in sodium chloride 0.9 % 500 mL chemo infusion   [COMPLETED] heparin lock flush 100 unit/mL   sodium chloride flush (NS) 0.9 % injection 10 mL    ALLERGIES:  No Known Allergies   PHYSICAL EXAM:  ECOG Performance status: 1  Vitals:   02/11/19 0958  BP: (!) 145/54  Pulse: 78  Resp: 18  Temp: 98.2 F (36.8 C)  SpO2: 98%   Filed Weights   02/11/19 0958  Weight: 206 lb 14.4 oz (93.8 kg)    Physical Exam Constitutional:      Appearance: Normal appearance.  Cardiovascular:     Rate and Rhythm: Normal rate and regular rhythm.     Heart sounds: Normal heart sounds.  Pulmonary:     Effort: Pulmonary effort is normal.     Breath sounds: Normal breath sounds.  Abdominal:     General: Bowel sounds are normal. There is no distension.     Palpations: Abdomen is soft.  Musculoskeletal:        General: No swelling.  Skin:    General: Skin is warm.  Neurological:     Mental Status: She is alert and oriented to person, place, and time.  Psychiatric:        Mood and Affect: Mood normal.        Behavior: Behavior normal.      LABORATORY DATA:  I have reviewed the labs as listed.  CBC    Component Value Date/Time   WBC 8.5 02/10/2019 1015   RBC 3.86 (L) 02/10/2019 1015   HGB 11.0 (L) 02/10/2019 1015   HCT 36.8 02/10/2019 1015   PLT 563 (H) 02/10/2019 1015   MCV 95.3 02/10/2019 1015   MCH 28.5 02/10/2019 1015   MCHC 29.9 (L) 02/10/2019 1015   RDW 14.4  02/10/2019 1015   LYMPHSABS 1.8 02/10/2019 1015   MONOABS 0.9 02/10/2019 1015   EOSABS 0.0 02/10/2019 1015   BASOSABS 0.1 02/10/2019 1015   CMP Latest Ref Rng & Units 02/10/2019 01/20/2019 01/12/2019  Glucose 70 - 99 mg/dL 122(H) 144(H) 124(H)  BUN 8 - 23 mg/dL _0 Creatinine 0.44 - 1.00 mg/dL 0.48 0.53 0.56  Sodium 135 - 145 mmol/L 140 141 140  Potassium 3.5 - 5.1 mmol/L 4.1 3.6 3.7  Chloride 98 - 111 mmol/L 103 105 103  CO2 22 - 32 mmol/L _1 Calcium 8.9 - 10.3 mg/dL 9.0 8.9 8.7(L)  Total Protein 6.5 - 8.1 g/dL 7.7 7.0 7.1  Total Bilirubin 0.3 - 1.2 mg/dL 0.5 0.4 0.6  Alkaline Phos 38 - 126 U/L 68 64 51  AST 15 - 41 U/L _2 ALT 0 - 44 U/L _3 DIAGNOSTIC IMAGING:  I have independently reviewed the scans and discussed with the patient.   I have reviewed Venita Lick LPN's note and agree with the documentation.  I personally performed a face-to-face visit, made revisions and my assessment and plan is as follows.    ASSESSMENT & PLAN:   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HCC) 1.  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  She was found to have an abnormal chest x-ray followed by CT scan which showed left upper lobe lung mass. - She smoked 1 to 2 packs of cigarettes per day for the last 50 years.  She quit smoking on 10/23/2018. - Denies any significant weight loss.  But has night sweats occasionally.  Denies any hemoptysis. - PET CT scan on 11/17/2018 showed left upper lobe lung mass, 4.9 cm, no evidence of adenopathy or metastasis.  MRI of the brain was negative for intracranial mets. - Left upper lobectomy and lymph node biopsy on 12/15/2018.  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   -Cancer type ID testing shows 96% probability of neuroendocrine carcinoma (small cell/large  cell carcinoma). - Because of high risk of recurrence, I have recommended 4 cycles of adjuvant chemotherapy with a small cell cancer based regimen. -Cycle 1 of carboplatin and etoposide on 01/20/2019. -She tolerated first cycle very well without any nausea, vomiting or diarrhea.  No fevers were reported.  She felt tired and shaky legs for few days.  Taste buds also changed. -I have reviewed her blood work.  She may proceed with cycle 2 today.  No dose modifications needed. - She will be seen back in 3 weeks for follow-up prior to cycle  3 for toxicity evaluation. ° ° °2.  Family history: °-Mother had lung cancer and was never smoker.  Father had pancreatic cancer. °-1 sister has colon cancer in 1 sister with leukemia.  Another sister had breast cancer. °-[facility] testing on 12/09/2018 was negative for hereditary malignancies. ° ° °  ° °Orders placed this encounter:  °No orders of the defined types were placed in this encounter. ° ° ° ° °Sreedhar Katragadda, MD °[facility] °[phone] °  ° °

## 2019-02-11 NOTE — Patient Instructions (Addendum)
[facility] at [facility] Discharge Instructions  You were seen today by Dr. Delton Coombes. He went over your recent lab results. He will see you back in 3 weeks  for labs treatment and follow up.   Thank you for choosing [place] [facility] to provide your oncology and hematology care.  To afford each patient quality time with our provider, please arrive at least 15 minutes before your scheduled appointment time.   If you have a lab appointment with the [facility] please come in thru the  Main Entrance and check in at the main information desk  You need to re-schedule your appointment should you arrive 10 or more minutes late.  We strive to give you quality time with our providers, and arriving late affects you and other patients whose appointments are after yours.  Also, if you no show three or more times for appointments you may be dismissed from the clinic at the providers discretion.     Again, thank you for choosing [facility].  Our hope is that these requests will decrease the amount of time that you wait before being seen by our physicians.       _____________________________________________________________  Should you have questions after your visit to [facility], please contact our office at [phone] between the hours of 8:00 a.m. and 4:30 p.m.  Voicemails left after 4:00 p.m. will not be returned until the following business day.  For prescription refill requests, have your pharmacy contact our office and allow 72 hours.    Cancer Center Support Programs:   > Cancer Support Group  2nd Tuesday of the month 1pm-2pm, Journey Room

## 2019-02-11 NOTE — Assessment & Plan Note (Signed)
1.  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  She was found to have an abnormal chest x-ray followed by CT scan which showed left upper lobe lung mass. - She smoked 1 to 2 packs of cigarettes per day for the last 50 years.  She quit smoking on 10/23/2018. - Denies any significant weight loss.  But has night sweats occasionally.  Denies any hemoptysis. - PET CT scan on 11/17/2018 showed left upper lobe lung mass, 4.9 cm, no evidence of adenopathy or metastasis.  MRI of the brain was negative for intracranial mets. - Left upper lobectomy and lymph node biopsy on 12/15/2018.  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   -Cancer type ID testing shows 96% probability of neuroendocrine carcinoma (small cell/large cell carcinoma). - Because of high risk of recurrence, I have recommended 4 cycles of adjuvant chemotherapy with a small cell cancer based regimen. -Cycle 1 of carboplatin and etoposide on 01/20/2019. -She tolerated first cycle very well without any nausea, vomiting or diarrhea.  No fevers were reported.  She felt tired and shaky legs for few days.  Taste buds also changed. -I have reviewed her blood work.  She may proceed with cycle 2 today.  No dose modifications needed. - She will be seen back in 3 weeks for follow-up prior to cycle 3 for toxicity evaluation.   2.  Family history: -Mother had lung cancer and was never smoker.  Father had pancreatic cancer. -1 sister has colon cancer in 1 sister with leukemia.  Another sister had breast cancer. -[facility] testing on 12/09/2018 was negative for hereditary malignancies.

## 2019-02-12 ENCOUNTER — Ambulatory Visit (HOSPITAL_COMMUNITY): Payer: Self-pay

## 2019-02-12 ENCOUNTER — Other Ambulatory Visit: Payer: Self-pay

## 2019-02-12 ENCOUNTER — Encounter (HOSPITAL_COMMUNITY): Payer: Self-pay

## 2019-02-12 ENCOUNTER — Inpatient Hospital Stay (HOSPITAL_COMMUNITY): Payer: Medicare HMO

## 2019-02-12 VITALS — BP 175/56 | HR 70 | Temp 98.2°F | Resp 18

## 2019-02-12 DIAGNOSIS — Z87891 Personal history of nicotine dependence: Secondary | ICD-10-CM | POA: Diagnosis not present

## 2019-02-12 DIAGNOSIS — C3490 Malignant neoplasm of unspecified part of unspecified bronchus or lung: Secondary | ICD-10-CM

## 2019-02-12 DIAGNOSIS — Z86000 Personal history of in-situ neoplasm of breast: Secondary | ICD-10-CM | POA: Diagnosis not present

## 2019-02-12 DIAGNOSIS — Z801 Family history of malignant neoplasm of trachea, bronchus and lung: Secondary | ICD-10-CM | POA: Diagnosis not present

## 2019-02-12 DIAGNOSIS — Z803 Family history of malignant neoplasm of breast: Secondary | ICD-10-CM | POA: Diagnosis not present

## 2019-02-12 DIAGNOSIS — Z800 Family history of malignant neoplasm of digestive organs: Secondary | ICD-10-CM | POA: Diagnosis not present

## 2019-02-12 DIAGNOSIS — R5383 Other fatigue: Secondary | ICD-10-CM | POA: Diagnosis not present

## 2019-02-12 DIAGNOSIS — C3412 Malignant neoplasm of upper lobe, left bronchus or lung: Secondary | ICD-10-CM | POA: Diagnosis not present

## 2019-02-12 DIAGNOSIS — Z5111 Encounter for antineoplastic chemotherapy: Secondary | ICD-10-CM | POA: Diagnosis not present

## 2019-02-12 DIAGNOSIS — Z79899 Other long term (current) drug therapy: Secondary | ICD-10-CM | POA: Diagnosis not present

## 2019-02-12 MED ORDER — HEPARIN SOD (PORK) LOCK FLUSH 100 UNIT/ML IV SOLN
500.0000 [IU] | Freq: Once | INTRAVENOUS | Status: AC | PRN
  Administered 2019-02-12: 500 [IU]

## 2019-02-12 MED ORDER — SODIUM CHLORIDE 0.9 % IV SOLN
10.0000 mg | Freq: Once | INTRAVENOUS | Status: AC
  Administered 2019-02-12: 10 mg via INTRAVENOUS
  Filled 2019-02-12: qty 1

## 2019-02-12 MED ORDER — SODIUM CHLORIDE 0.9 % IV SOLN
100.0000 mg/m2 | Freq: Once | INTRAVENOUS | Status: AC
  Administered 2019-02-12: 200 mg via INTRAVENOUS
  Filled 2019-02-12: qty 10

## 2019-02-12 MED ORDER — SODIUM CHLORIDE 0.9 % IV SOLN
Freq: Once | INTRAVENOUS | Status: AC
  Administered 2019-02-12: 11:00:00 via INTRAVENOUS

## 2019-02-12 MED ORDER — SODIUM CHLORIDE 0.9% FLUSH
10.0000 mL | INTRAVENOUS | Status: DC | PRN
  Administered 2019-02-12: 10 mL
  Filled 2019-02-12: qty 10

## 2019-02-12 NOTE — Patient Instructions (Signed)
[facility] Discharge Instructions for Patients Receiving Chemotherapy   Beginning January 23rd 2017 lab work for the [facility] will be done in the  Main lab at [facility] on 1st floor. If you have a lab appointment with the [facility] please come in thru the  Main Entrance and check in at the main information desk   Today you received the following chemotherapy agents VP-16. Follow-up as scheduled. Call clinic for any questions or concerns  To help prevent nausea and vomiting after your treatment, we encourage you to take your nausea medication   If you develop nausea and vomiting, or diarrhea that is not controlled by your medication, call the clinic.  The clinic phone number is [phone]. Office hours are Monday-Friday 8:30am-5:00pm.  BELOW ARE SYMPTOMS THAT SHOULD BE REPORTED IMMEDIATELY:  *FEVER GREATER THAN 101.0 F  *CHILLS WITH OR WITHOUT FEVER  NAUSEA AND VOMITING THAT IS NOT CONTROLLED WITH YOUR NAUSEA MEDICATION  *UNUSUAL SHORTNESS OF BREATH  *UNUSUAL BRUISING OR BLEEDING  TENDERNESS IN MOUTH AND THROAT WITH OR WITHOUT PRESENCE OF ULCERS  *URINARY PROBLEMS  *BOWEL PROBLEMS  UNUSUAL RASH Items with * indicate a potential emergency and should be followed up as soon as possible. If you have an emergency after office hours please contact your primary care physician or go to the nearest emergency department.  Please call the clinic during office hours if you have any questions or concerns.   You may also contact the Patient Navigator at [phone] should you have any questions or need assistance in obtaining follow up care.      Resources For Cancer Patients and their Caregivers ? American Cancer Society: Can assist with transportation, wigs, general needs, runs Look Good Feel Better.        954-329-1305 ? Cancer Care: Provides financial assistance, online support groups, medication/co-pay assistance.  1-800-813-HOPE  6301042618) ? [place] Assists [place] cancer patients and their families through emotional , educational and financial support.  (859)737-5834 ? [place] DSS Where to apply for food stamps, Medicaid and utility assistance. 206-484-3912 ? [facility]: Transportation to medical appointments. (404)452-6685 ? Social Security Administration: May apply for disability if have a Stage IV cancer. ([phone] 980-663-2234 ? [employer], Disability and Transit Services: Assists with nutrition, care and transit needs. [phone]

## 2019-02-12 NOTE — Progress Notes (Signed)
Laura Abbott tolerated VP-16 infusion well without complaints or incident. VSS upon discharge. Pt discharged self ambulatory in satisfactory condition

## 2019-02-13 ENCOUNTER — Ambulatory Visit (HOSPITAL_COMMUNITY): Payer: Self-pay

## 2019-02-13 ENCOUNTER — Inpatient Hospital Stay (HOSPITAL_COMMUNITY): Payer: Medicare HMO | Attending: Hematology

## 2019-02-13 ENCOUNTER — Other Ambulatory Visit: Payer: Self-pay

## 2019-02-13 ENCOUNTER — Encounter (HOSPITAL_COMMUNITY): Payer: Self-pay

## 2019-02-13 VITALS — BP 155/53 | HR 66 | Temp 98.4°F

## 2019-02-13 DIAGNOSIS — C3490 Malignant neoplasm of unspecified part of unspecified bronchus or lung: Secondary | ICD-10-CM

## 2019-02-13 DIAGNOSIS — C3412 Malignant neoplasm of upper lobe, left bronchus or lung: Secondary | ICD-10-CM | POA: Insufficient documentation

## 2019-02-13 DIAGNOSIS — Z5189 Encounter for other specified aftercare: Secondary | ICD-10-CM | POA: Diagnosis not present

## 2019-02-13 MED ORDER — PEGFILGRASTIM-CBQV 6 MG/0.6ML ~~LOC~~ SOSY
6.0000 mg | PREFILLED_SYRINGE | Freq: Once | SUBCUTANEOUS | Status: AC
  Administered 2019-02-13: 6 mg via SUBCUTANEOUS

## 2019-02-13 MED ORDER — PEGFILGRASTIM-CBQV 6 MG/0.6ML ~~LOC~~ SOSY
PREFILLED_SYRINGE | SUBCUTANEOUS | Status: AC
  Filled 2019-02-13: qty 0.6

## 2019-02-13 NOTE — Patient Instructions (Signed)
[place] at [facility] Discharge Instructions  Received Udenyca injection today. Follow-up as scheduled. Call clinic for any questions or concerns   Thank you for choosing [place] at [facility] to provide your oncology and hematology care.  To afford each patient quality time with our provider, please arrive at least 15 minutes before your scheduled appointment time.   If you have a lab appointment with the [facility] please come in thru the  Main Entrance and check in at the main information desk  You need to re-schedule your appointment should you arrive 10 or more minutes late.  We strive to give you quality time with our providers, and arriving late affects you and other patients whose appointments are after yours.  Also, if you no show three or more times for appointments you may be dismissed from the clinic at the providers discretion.     Again, thank you for choosing [facility].  Our hope is that these requests will decrease the amount of time that you wait before being seen by our physicians.       _____________________________________________________________  Should you have questions after your visit to [facility], please contact our office at [phone] between the hours of 8:00 a.m. and 4:30 p.m.  Voicemails left after 4:00 p.m. will not be returned until the following business day.  For prescription refill requests, have your pharmacy contact our office and allow 72 hours.    Cancer Center Support Programs:   > Cancer Support Group  2nd Tuesday of the month 1pm-2pm, Journey Room

## 2019-02-13 NOTE — Progress Notes (Signed)
Karen Kays presents today for injection per MD orders. Udenyca administered SQ in right Upper Arm. Administration without incident. Patient tolerated well.   Vital signs stable. No complaints at this time. Discharged from clinic ambulatory. F/U with [facility] as scheduled.

## 2019-02-16 DIAGNOSIS — I2510 Atherosclerotic heart disease of native coronary artery without angina pectoris: Secondary | ICD-10-CM | POA: Diagnosis not present

## 2019-02-16 DIAGNOSIS — Z483 Aftercare following surgery for neoplasm: Secondary | ICD-10-CM | POA: Diagnosis not present

## 2019-02-16 DIAGNOSIS — J449 Chronic obstructive pulmonary disease, unspecified: Secondary | ICD-10-CM | POA: Diagnosis not present

## 2019-02-16 DIAGNOSIS — C3412 Malignant neoplasm of upper lobe, left bronchus or lung: Secondary | ICD-10-CM | POA: Diagnosis not present

## 2019-02-16 DIAGNOSIS — Z902 Acquired absence of lung [part of]: Secondary | ICD-10-CM | POA: Diagnosis not present

## 2019-02-16 DIAGNOSIS — F329 Major depressive disorder, single episode, unspecified: Secondary | ICD-10-CM | POA: Diagnosis not present

## 2019-02-16 DIAGNOSIS — M179 Osteoarthritis of knee, unspecified: Secondary | ICD-10-CM | POA: Diagnosis not present

## 2019-02-16 DIAGNOSIS — I10 Essential (primary) hypertension: Secondary | ICD-10-CM | POA: Diagnosis not present

## 2019-02-16 DIAGNOSIS — F419 Anxiety disorder, unspecified: Secondary | ICD-10-CM | POA: Diagnosis not present

## 2019-02-17 DIAGNOSIS — C50919 Malignant neoplasm of unspecified site of unspecified female breast: Secondary | ICD-10-CM | POA: Diagnosis not present

## 2019-02-17 DIAGNOSIS — Z299 Encounter for prophylactic measures, unspecified: Secondary | ICD-10-CM | POA: Diagnosis not present

## 2019-02-17 DIAGNOSIS — Z87891 Personal history of nicotine dependence: Secondary | ICD-10-CM | POA: Diagnosis not present

## 2019-02-17 DIAGNOSIS — J449 Chronic obstructive pulmonary disease, unspecified: Secondary | ICD-10-CM | POA: Diagnosis not present

## 2019-02-17 DIAGNOSIS — T7840XA Allergy, unspecified, initial encounter: Secondary | ICD-10-CM | POA: Diagnosis not present

## 2019-02-18 DIAGNOSIS — Z483 Aftercare following surgery for neoplasm: Secondary | ICD-10-CM | POA: Diagnosis not present

## 2019-02-18 DIAGNOSIS — I10 Essential (primary) hypertension: Secondary | ICD-10-CM | POA: Diagnosis not present

## 2019-02-18 DIAGNOSIS — Z902 Acquired absence of lung [part of]: Secondary | ICD-10-CM | POA: Diagnosis not present

## 2019-02-18 DIAGNOSIS — M179 Osteoarthritis of knee, unspecified: Secondary | ICD-10-CM | POA: Diagnosis not present

## 2019-02-18 DIAGNOSIS — F419 Anxiety disorder, unspecified: Secondary | ICD-10-CM | POA: Diagnosis not present

## 2019-02-18 DIAGNOSIS — I2510 Atherosclerotic heart disease of native coronary artery without angina pectoris: Secondary | ICD-10-CM | POA: Diagnosis not present

## 2019-02-18 DIAGNOSIS — C3412 Malignant neoplasm of upper lobe, left bronchus or lung: Secondary | ICD-10-CM | POA: Diagnosis not present

## 2019-02-18 DIAGNOSIS — J449 Chronic obstructive pulmonary disease, unspecified: Secondary | ICD-10-CM | POA: Diagnosis not present

## 2019-02-18 DIAGNOSIS — F329 Major depressive disorder, single episode, unspecified: Secondary | ICD-10-CM | POA: Diagnosis not present

## 2019-02-24 DIAGNOSIS — Z483 Aftercare following surgery for neoplasm: Secondary | ICD-10-CM | POA: Diagnosis not present

## 2019-02-24 DIAGNOSIS — Z902 Acquired absence of lung [part of]: Secondary | ICD-10-CM | POA: Diagnosis not present

## 2019-02-24 DIAGNOSIS — C3412 Malignant neoplasm of upper lobe, left bronchus or lung: Secondary | ICD-10-CM | POA: Diagnosis not present

## 2019-02-24 DIAGNOSIS — F419 Anxiety disorder, unspecified: Secondary | ICD-10-CM | POA: Diagnosis not present

## 2019-02-24 DIAGNOSIS — M179 Osteoarthritis of knee, unspecified: Secondary | ICD-10-CM | POA: Diagnosis not present

## 2019-02-24 DIAGNOSIS — I2510 Atherosclerotic heart disease of native coronary artery without angina pectoris: Secondary | ICD-10-CM | POA: Diagnosis not present

## 2019-02-24 DIAGNOSIS — I10 Essential (primary) hypertension: Secondary | ICD-10-CM | POA: Diagnosis not present

## 2019-02-24 DIAGNOSIS — J449 Chronic obstructive pulmonary disease, unspecified: Secondary | ICD-10-CM | POA: Diagnosis not present

## 2019-02-24 DIAGNOSIS — F329 Major depressive disorder, single episode, unspecified: Secondary | ICD-10-CM | POA: Diagnosis not present

## 2019-02-25 DIAGNOSIS — J449 Chronic obstructive pulmonary disease, unspecified: Secondary | ICD-10-CM | POA: Diagnosis not present

## 2019-02-25 DIAGNOSIS — M159 Polyosteoarthritis, unspecified: Secondary | ICD-10-CM | POA: Diagnosis not present

## 2019-02-25 DIAGNOSIS — I10 Essential (primary) hypertension: Secondary | ICD-10-CM | POA: Diagnosis not present

## 2019-02-26 DIAGNOSIS — C3412 Malignant neoplasm of upper lobe, left bronchus or lung: Secondary | ICD-10-CM | POA: Diagnosis not present

## 2019-02-26 DIAGNOSIS — Z902 Acquired absence of lung [part of]: Secondary | ICD-10-CM | POA: Diagnosis not present

## 2019-02-26 DIAGNOSIS — Z483 Aftercare following surgery for neoplasm: Secondary | ICD-10-CM | POA: Diagnosis not present

## 2019-02-28 DIAGNOSIS — J441 Chronic obstructive pulmonary disease with (acute) exacerbation: Secondary | ICD-10-CM | POA: Diagnosis not present

## 2019-03-02 ENCOUNTER — Other Ambulatory Visit: Payer: Self-pay

## 2019-03-02 ENCOUNTER — Inpatient Hospital Stay (HOSPITAL_COMMUNITY): Payer: Medicare HMO | Attending: Hematology | Admitting: Hematology

## 2019-03-02 ENCOUNTER — Inpatient Hospital Stay (HOSPITAL_COMMUNITY): Payer: Medicare HMO

## 2019-03-02 ENCOUNTER — Encounter (HOSPITAL_COMMUNITY): Payer: Self-pay | Admitting: Hematology

## 2019-03-02 VITALS — BP 159/57 | HR 77 | Temp 97.9°F | Resp 16

## 2019-03-02 DIAGNOSIS — R200 Anesthesia of skin: Secondary | ICD-10-CM | POA: Insufficient documentation

## 2019-03-02 DIAGNOSIS — Z5111 Encounter for antineoplastic chemotherapy: Secondary | ICD-10-CM | POA: Insufficient documentation

## 2019-03-02 DIAGNOSIS — Z801 Family history of malignant neoplasm of trachea, bronchus and lung: Secondary | ICD-10-CM | POA: Diagnosis not present

## 2019-03-02 DIAGNOSIS — Z806 Family history of leukemia: Secondary | ICD-10-CM | POA: Diagnosis not present

## 2019-03-02 DIAGNOSIS — R05 Cough: Secondary | ICD-10-CM | POA: Insufficient documentation

## 2019-03-02 DIAGNOSIS — Z87891 Personal history of nicotine dependence: Secondary | ICD-10-CM | POA: Diagnosis not present

## 2019-03-02 DIAGNOSIS — Z79899 Other long term (current) drug therapy: Secondary | ICD-10-CM

## 2019-03-02 DIAGNOSIS — R197 Diarrhea, unspecified: Secondary | ICD-10-CM | POA: Diagnosis not present

## 2019-03-02 DIAGNOSIS — Z86000 Personal history of in-situ neoplasm of breast: Secondary | ICD-10-CM

## 2019-03-02 DIAGNOSIS — C3490 Malignant neoplasm of unspecified part of unspecified bronchus or lung: Secondary | ICD-10-CM

## 2019-03-02 DIAGNOSIS — C3412 Malignant neoplasm of upper lobe, left bronchus or lung: Secondary | ICD-10-CM

## 2019-03-02 DIAGNOSIS — Z5189 Encounter for other specified aftercare: Secondary | ICD-10-CM | POA: Insufficient documentation

## 2019-03-02 DIAGNOSIS — Z800 Family history of malignant neoplasm of digestive organs: Secondary | ICD-10-CM | POA: Diagnosis not present

## 2019-03-02 DIAGNOSIS — R5383 Other fatigue: Secondary | ICD-10-CM | POA: Diagnosis not present

## 2019-03-02 LAB — CBC WITH DIFFERENTIAL/PLATELET
Abs Immature Granulocytes: 0.05 10*3/uL (ref 0.00–0.07)
Basophils Absolute: 0 10*3/uL (ref 0.0–0.1)
Basophils Relative: 1 %
Eosinophils Absolute: 0 10*3/uL (ref 0.0–0.5)
Eosinophils Relative: 0 %
HCT: 34.4 % — ABNORMAL LOW (ref 36.0–46.0)
Hemoglobin: 10.5 g/dL — ABNORMAL LOW (ref 12.0–15.0)
Immature Granulocytes: 1 %
Lymphocytes Relative: 20 %
Lymphs Abs: 1.6 10*3/uL (ref 0.7–4.0)
MCH: 28.7 pg (ref 26.0–34.0)
MCHC: 30.5 g/dL (ref 30.0–36.0)
MCV: 94 fL (ref 80.0–100.0)
Monocytes Absolute: 0.7 10*3/uL (ref 0.1–1.0)
Monocytes Relative: 8 %
Neutro Abs: 5.9 10*3/uL (ref 1.7–7.7)
Neutrophils Relative %: 70 %
Platelets: 386 10*3/uL (ref 150–400)
RBC: 3.66 MIL/uL — ABNORMAL LOW (ref 3.87–5.11)
RDW: 16 % — ABNORMAL HIGH (ref 11.5–15.5)
WBC: 8.3 10*3/uL (ref 4.0–10.5)
nRBC: 0 % (ref 0.0–0.2)

## 2019-03-02 LAB — MAGNESIUM: Magnesium: 1.7 mg/dL (ref 1.7–2.4)

## 2019-03-02 LAB — COMPREHENSIVE METABOLIC PANEL
ALT: 21 U/L (ref 0–44)
AST: 25 U/L (ref 15–41)
Albumin: 3.7 g/dL (ref 3.5–5.0)
Alkaline Phosphatase: 78 U/L (ref 38–126)
Anion gap: 10 (ref 5–15)
BUN: 9 mg/dL (ref 8–23)
CO2: 25 mmol/L (ref 22–32)
Calcium: 9 mg/dL (ref 8.9–10.3)
Chloride: 105 mmol/L (ref 98–111)
Creatinine, Ser: 0.53 mg/dL (ref 0.44–1.00)
GFR calc Af Amer: >60 mL/min (ref >60)
GFR calc non Af Amer: >60 mL/min (ref >60)
Glucose, Bld: 146 mg/dL — ABNORMAL HIGH (ref 70–99)
Potassium: 3.6 mmol/L (ref 3.5–5.1)
Sodium: 140 mmol/L (ref 135–145)
Total Bilirubin: 0.4 mg/dL (ref 0.3–1.2)
Total Protein: 6.9 g/dL (ref 6.5–8.1)

## 2019-03-02 MED ORDER — SODIUM CHLORIDE 0.9 % IV SOLN
10.0000 mg | Freq: Once | INTRAVENOUS | Status: AC
  Administered 2019-03-02: 10 mg via INTRAVENOUS
  Filled 2019-03-02: qty 10

## 2019-03-02 MED ORDER — HEPARIN SOD (PORK) LOCK FLUSH 100 UNIT/ML IV SOLN
500.0000 [IU] | Freq: Once | INTRAVENOUS | Status: AC | PRN
  Administered 2019-03-02: 14:00:00 500 [IU]

## 2019-03-02 MED ORDER — SODIUM CHLORIDE 0.9% FLUSH
10.0000 mL | INTRAVENOUS | Status: DC | PRN
  Administered 2019-03-02: 09:00:00 10 mL
  Filled 2019-03-02: qty 10

## 2019-03-02 MED ORDER — SODIUM CHLORIDE 0.9 % IV SOLN
504.5000 mg | Freq: Once | INTRAVENOUS | Status: AC
  Administered 2019-03-02: 13:00:00 500 mg via INTRAVENOUS
  Filled 2019-03-02: qty 50

## 2019-03-02 MED ORDER — SODIUM CHLORIDE 0.9 % IV SOLN
Freq: Once | INTRAVENOUS | Status: AC
  Administered 2019-03-02: 10:00:00 via INTRAVENOUS

## 2019-03-02 MED ORDER — SODIUM CHLORIDE 0.9 % IV SOLN
100.0000 mg/m2 | Freq: Once | INTRAVENOUS | Status: AC
  Administered 2019-03-02: 12:00:00 200 mg via INTRAVENOUS
  Filled 2019-03-02: qty 10

## 2019-03-02 MED ORDER — PALONOSETRON HCL INJECTION 0.25 MG/5ML
0.2500 mg | Freq: Once | INTRAVENOUS | Status: AC
  Administered 2019-03-02: 0.25 mg via INTRAVENOUS
  Filled 2019-03-02: qty 5

## 2019-03-02 NOTE — Progress Notes (Signed)
[place], [place] [id number]   CLINIC:  Medical Oncology/Hematology  PCP:  Glenda Chroman, MD [place] [address] [phone]   REASON FOR VISIT:  Follow-up for neuroendocrinelung cancer   BRIEF ONCOLOGIC HISTORY:    DCIS (ductal carcinoma in situ) of breast   10/28/2013 Initial Diagnosis    DCIS (ductal carcinoma in situ) of breast    12/21/2018 Genetic Testing    Negative genetic testing on the multicancer panel.  The Multi-Gene Panel offered by [facility] includes sequencing and/or deletion duplication testing of the following 84 genes: AIP, ALK, APC, ATM, AXIN2,BAP1,  BARD1, BLM, BMPR1A, BRCA1, BRCA2, BRIP1, CASR, CDC73, CDH1, CDK4, CDKN1B, CDKN1C, CDKN2A (p14ARF), CDKN2A (p16INK4a), CEBPA, CHEK2, CTNNA1, DICER1, DIS3L2, EGFR (c.2369C>T, p.Thr790Met variant only), EPCAM (Deletion/duplication testing only), FH, FLCN, GATA2, GPC3, GREM1 (Promoter region deletion/duplication testing only), HOXB13 (c.251G>A, p.Gly84Glu), HRAS, KIT, MAX, MEN1, MET, MITF (c.952G>A, p.Glu318Lys variant only), MLH1, MSH2, MSH3, MSH6, MUTYH, NBN, NF1, NF2, NTHL1, PALB2, PDGFRA, PHOX2B, PMS2, POLD1, POLE, POT1, PRKAR1A, PTCH1, PTEN, RAD50, RAD51C, RAD51D, RB1, RECQL4, RET, RUNX1, SDHAF2, SDHA (sequence changes only), SDHB, SDHC, SDHD, SMAD4, SMARCA4, SMARCB1, SMARCE1, STK11, SUFU, TERC, TERT, TMEM127, TP53, TSC1, TSC2, VHL, WRN and WT1.  The report date is 12/21/2018.     Lung cancer ([place])   12/15/2018 Initial Diagnosis    Lung cancer ([employer])    12/21/2018 Genetic Testing    Negative genetic testing on the multicancer panel.  The Multi-Gene Panel offered by [facility] includes sequencing and/or deletion duplication testing of the following 84 genes: AIP, ALK, APC, ATM, AXIN2,BAP1,  BARD1, BLM, BMPR1A, BRCA1, BRCA2, BRIP1, CASR, CDC73, CDH1, CDK4, CDKN1B, CDKN1C, CDKN2A (p14ARF), CDKN2A (p16INK4a), CEBPA, CHEK2, CTNNA1, DICER1, DIS3L2, EGFR (c.2369C>T, p.Thr790Met variant  only), EPCAM (Deletion/duplication testing only), FH, FLCN, GATA2, GPC3, GREM1 (Promoter region deletion/duplication testing only), HOXB13 (c.251G>A, p.Gly84Glu), HRAS, KIT, MAX, MEN1, MET, MITF (c.952G>A, p.Glu318Lys variant only), MLH1, MSH2, MSH3, MSH6, MUTYH, NBN, NF1, NF2, NTHL1, PALB2, PDGFRA, PHOX2B, PMS2, POLD1, POLE, POT1, PRKAR1A, PTCH1, PTEN, RAD50, RAD51C, RAD51D, RB1, RECQL4, RET, RUNX1, SDHAF2, SDHA (sequence changes only), SDHB, SDHC, SDHD, SMAD4, SMARCA4, SMARCB1, SMARCE1, STK11, SUFU, TERC, TERT, TMEM127, TP53, TSC1, TSC2, VHL, WRN and WT1.  The report date is 12/21/2018.     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  The Multi-Gene Panel offered by [facility] includes sequencing and/or deletion duplication testing of the following 84 genes: AIP, ALK, APC, ATM, AXIN2,BAP1,  BARD1, BLM, BMPR1A, BRCA1, BRCA2, BRIP1, CASR, CDC73, CDH1, CDK4, CDKN1B, CDKN1C, CDKN2A (p14ARF), CDKN2A (p16INK4a), CEBPA, CHEK2, CTNNA1, DICER1, DIS3L2, EGFR (c.2369C>T, p.Thr790Met variant only), EPCAM (Deletion/duplication testing only), FH, FLCN, GATA2, GPC3, GREM1 (Promoter region deletion/duplication testing only), HOXB13 (c.251G>A, p.Gly84Glu), HRAS, KIT, MAX, MEN1, MET, MITF (c.952G>A, p.Glu318Lys variant only), MLH1, MSH2, MSH3, MSH6, MUTYH, NBN, NF1, NF2, NTHL1, PALB2, PDGFRA, PHOX2B, PMS2, POLD1, POLE, POT1, PRKAR1A, PTCH1, PTEN, RAD50, RAD51C, RAD51D, RB1, RECQL4, RET, RUNX1, SDHAF2, SDHA (sequence changes only), SDHB, SDHC, SDHD, SMAD4, SMARCA4, SMARCB1, SMARCE1, STK11, SUFU, TERC, TERT, TMEM127, TP53, TSC1, TSC2, VHL, WRN and WT1.  The report date is 12/21/2018.     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[place])   01/12/2019 Initial Diagnosis    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[place])    01/20/2019 -  Chemotherapy    The patient had  palonosetron (ALOXI) injection 0.25 mg,  0.25 mg, Intravenous,  Once, 3 of 4 cycles Administration: 0.25 mg (01/20/2019), 0.25 mg (02/10/2019), 0.25 mg (03/02/2019) pegfilgrastim-cbqv (UDENYCA) injection 6 mg, 6 mg, Subcutaneous, Once, 3 of 4 cycles Administration: 6 mg (01/23/2019), 6 mg (02/13/2019) CARBOplatin (PARAPLATIN) 500 mg in sodium chloride 0.9 % 250 mL chemo infusion, 500 mg (100 % of original dose 504.5 mg), Intravenous,  Once, 3 of 4 cycles Dose modification:   (original dose 504.5 mg, Cycle 1),   (original dose 504.5 mg, Cycle 2),   (original dose 504.5 mg, Cycle 3) Administration: 500 mg (01/20/2019), 500 mg (02/10/2019), 500 mg (03/02/2019) etoposide (VEPESID) 200 mg in sodium chloride 0.9 % 500 mL chemo infusion, 100 mg/m2 = 200 mg, Intravenous,  Once, 3 of 4 cycles Administration: 200 mg (01/20/2019), 200 mg (01/21/2019), 200 mg (01/22/2019), 200 mg (02/10/2019), 200 mg (02/11/2019), 200 mg (02/12/2019), 200 mg (03/02/2019)  for chemotherapy treatment.       CANCER STAGING: Cancer Staging DCIS (ductal carcinoma in situ) of breast Staging form: Breast, AJCC 7th Edition - Clinical stage from 12/01/2013: Stage 0 (Tis (DCIS), N0, cM0) - Signed by Heath Lark, MD on 12/01/2013 - Pathologic: Stage 0 (Tis (DCIS), N0, cM0) - Signed by Heath Lark, MD on 12/01/2013    INTERVAL HISTORY:  Ms. Ehler 73 y.o. female returns for routine follow-up and consideration for next cycle of chemotherapy. She is here today alone. She states that she has had a few episodes of diarrhea since her last treatment. She also states that she has continued back pain. Denies any nausea, vomiting, or diarrhea. Denies any new pains. Had not noticed any recent bleeding such as epistaxis, hematuria or hematochezia. Denies recent chest pain on exertion, shortness of breath on minimal exertion, pre-syncopal episodes, or palpitations. Denies any numbness or tingling in hands or feet. Denies any recent fevers, infections, or recent hospitalizations. Patient reports appetite at 50%  and energy level at 75%.      REVIEW OF SYSTEMS:  Review of Systems  Constitutional: Positive for fatigue.  Gastrointestinal: Positive for diarrhea.  All other systems reviewed and are negative.    PAST MEDICAL/SURGICAL HISTORY:  Past Medical History:  Diagnosis Date   Anxiety    Arthritis    Asthma    Breast cancer ([place])    breast, right DCIS   COPD (chronic obstructive pulmonary disease) ([place])    COPD exacerbation ([place]) 01/03/2014   With hypoxia   Depression    Emphysema of lung ([place])    Family history of breast cancer    Family history of colon cancer    Family history of kidney cancer    Family history of lung cancer    Family history of pancreatic cancer    GERD (gastroesophageal reflux disease)    History of kidney stones    Hyperlipidemia    Hypertension    Obesity 01/05/2014   Osteoporosis    Osteoporosis, unspecified 12/01/2013   Pneumonia    x2   Tobacco abuse 12/01/2013   Past Surgical History:  Procedure Laterality Date   ABDOMINAL HYSTERECTOMY     BLADDER SURGERY     had tacked   BREAST LUMPECTOMY WITH NEEDLE LOCALIZATION AND AXILLARY SENTINEL LYMPH NODE BX Right 11/09/2013   Procedure: BREAST LUMPECTOMY WITH NEEDLE LOCALIZATION AND AXILLARY SENTINEL LYMPH NODE BIOPSY;  Surgeon: Harl Bowie, MD;  Location: [facility];  Service: General;  Laterality: Right;   BREAST SURGERY Right    lumpectomy  CHOLECYSTECTOMY     NODE DISSECTION Left 12/15/2018   Procedure: NODE DISSECTION LEFT LUNG;  Surgeon: Melrose Nakayama, MD;  Location: [facility];  Service: Thoracic;  Laterality: Left;   PORTACATH PLACEMENT Right 01/19/2019   Procedure: INSERTION PORT-A-CATH;  Surgeon: Aviva Signs, MD;  Location: [facility];  Service: General;  Laterality: Right;   VIDEO ASSISTED THORACOSCOPY (VATS)/ LOBECTOMY Left 12/15/2018   Procedure: VIDEO ASSISTED THORACOSCOPY (VATS)/LEFT UPPER LOBECTOMY;  Surgeon: Melrose Nakayama, MD;  Location: [facility] OR;   Service: Thoracic;  Laterality: Left;     SOCIAL HISTORY:  Social History   Socioeconomic History   Marital status: Married    Spouse name: Not on file   Number of children: 3   Years of education: Not on file   Highest education level: Not on file  Occupational History   Occupation: Database administrator    Comment: [employer]    Comment: [place]    CommentResearch scientist (life sciences) strain: Not hard at [employer] insecurity:    Worry: Never true    Inability: Never true   Transportation needs:    Medical: No    Non-medical: No  Tobacco Use   Smoking status: Former Smoker    Packs/day: 0.50    Years: 43.00    Pack years: 21.50    Types: Cigarettes    Last attempt to quit: 10/24/2018    Years since quitting: 0.3   Smokeless tobacco: Never Used  Substance and Sexual Activity   Alcohol use: No   Drug use: No   Sexual activity: Not Currently  Lifestyle   Physical activity:    Days per week: 0 days    Minutes per session: 0 min   Stress: Rather much  Relationships   Social connections:    Talks on phone: More than three times a week    Gets together: More than three times a week    Attends religious service: More than 4 times per year    Active member of club or organization: Yes    Attends meetings of clubs or organizations: More than 4 times per year    Relationship status: Married   Intimate partner violence:    Fear of current or ex partner: No    Emotionally abused: No    Physically abused: No    Forced sexual activity: No  Other Topics Concern   Not on file  Social History Narrative   Not on file    FAMILY HISTORY:  Family History  Problem Relation Age of Onset   Lung cancer Mother 46       non smoker   Pancreatic cancer Father 71       d. 72   Leukemia Sister        dx in her 34s   Heart disease Sister    Colon cancer Sister        dx and died in her 70s   Heart disease Sister    Lung cancer  Maternal Aunt        non-smoker   Heart disease Sister    Breast cancer Sister        dx early 14s   Heart disease Brother    Cirrhosis Brother        d. 58   Aneurysm Brother        d. 51s   Fibromyalgia Daughter    Kidney cancer Nephew 71   Kidney  cancer Nephew        dx in 61s; great nephew   Leukemia Nephew    Liver disease Nephew    Other Niece        white matter on brain; dx late 58s    CURRENT MEDICATIONS:  Outpatient Encounter Medications as of 03/02/2019  Medication Sig   albuterol (PROVENTIL HFA;VENTOLIN HFA) 108 (90 BASE) MCG/ACT inhaler Inhale 2 puffs into the lungs 3 (three) times daily. Also use the inhaler every 2 hours if needed for worsening shortness of breath and wheezing. (Patient taking differently: Inhale 2 puffs into the lungs 3 (three) times daily as needed for wheezing or shortness of breath. )   Calcium 600-200 MG-UNIT per tablet Take 1 tablet by mouth daily.   CARBOPLATIN IV Inject into the vein every 21 ( twenty-one) days.   cephALEXin (KEFLEX) 500 MG capsule    clonazePAM (KLONOPIN) 0.5 MG tablet Take 0.5 mg by mouth at bedtime.    ETOPOSIDE IV Inject into the vein every 21 ( twenty-one) days.   FLUoxetine (PROZAC) 40 MG capsule Take 40 mg by mouth daily.   fluticasone (FLONASE) 50 MCG/ACT nasal spray Place 2 sprays into both nostrils daily.   fluticasone furoate-vilanterol (BREO ELLIPTA) 100-25 MCG/INH AEPB Inhale 1 puff into the lungs daily.   hydrOXYzine (ATARAX/VISTARIL) 25 MG tablet    lidocaine-prilocaine (EMLA) cream Apply 1 application topically as needed. Apply pea sized amount to port site 1 hour prior to appointment and cover with plastic wrap   lisinopril (PRINIVIL,ZESTRIL) 10 MG tablet Take 10 mg by mouth daily.   metoprolol (LOPRESSOR) 50 MG tablet Take 50-75 mg by mouth 2 (two) times daily. Takes 75 mg in the morning and 50 mg in the evening   nabumetone (RELAFEN) 500 MG tablet Take 500 mg by mouth every morning.     omeprazole (PRILOSEC) 40 MG capsule Take 40 mg by mouth daily.    oxyCODONE (OXY IR/ROXICODONE) 5 MG immediate release tablet Take 1 tablet (5 mg total) by mouth every 6 (six) hours as needed for severe pain.   prochlorperazine (COMPAZINE) 10 MG tablet Take 1 tablet (10 mg total) by mouth every 6 (six) hours as needed for nausea or vomiting.   traZODone (DESYREL) 50 MG tablet Take 50 mg by mouth at bedtime.   triamcinolone cream (KENALOG) 0.1 %    Facility-Administered Encounter Medications as of 03/02/2019  Medication   sodium chloride flush (NS) 0.9 % injection 10 mL    ALLERGIES:  No Known Allergies   PHYSICAL EXAM:  ECOG Performance status: 1  Vitals:   03/02/19 0901  BP: (!) 155/49  Pulse: 71  Resp: 16  Temp: 98.9 F (37.2 C)  SpO2: 96%   Filed Weights   03/02/19 0901  Weight: 204 lb 4.8 oz (92.7 kg)    Physical Exam Vitals signs reviewed.  Constitutional:      Appearance: Normal appearance.  Cardiovascular:     Rate and Rhythm: Normal rate and regular rhythm.     Heart sounds: Normal heart sounds.  Pulmonary:     Effort: Pulmonary effort is normal.     Breath sounds: Normal breath sounds.  Abdominal:     General: Bowel sounds are normal. There is no distension.     Palpations: Abdomen is soft.  Musculoskeletal:        General: No swelling.  Skin:    General: Skin is warm.  Neurological:     General: No focal deficit present.  Mental Status: She is alert and oriented to person, place, and time.  Psychiatric:        Mood and Affect: Mood normal.        Behavior: Behavior normal.      LABORATORY DATA:  I have reviewed the labs as listed.  CBC    Component Value Date/Time   WBC 8.3 03/02/2019 0915   RBC 3.66 (L) 03/02/2019 0915   HGB 10.5 (L) 03/02/2019 0915   HCT 34.4 (L) 03/02/2019 0915   PLT 386 03/02/2019 0915   MCV 94.0 03/02/2019 0915   MCH 28.7 03/02/2019 0915   MCHC 30.5 03/02/2019 0915   RDW 16.0 (H) 03/02/2019 0915    LYMPHSABS 1.6 03/02/2019 0915   MONOABS 0.7 03/02/2019 0915   EOSABS 0.0 03/02/2019 0915   BASOSABS 0.0 03/02/2019 0915   CMP Latest Ref Rng & Units 03/02/2019 02/10/2019 01/20/2019  Glucose 70 - 99 mg/dL 146(H) 122(H) 144(H)  BUN 8 - 23 mg/dL _0 Creatinine 0.44 - 1.00 mg/dL 0.53 0.48 0.53  Sodium 135 - 145 mmol/L 140 140 141  Potassium 3.5 - 5.1 mmol/L 3.6 4.1 3.6  Chloride 98 - 111 mmol/L 105 103 105  CO2 22 - 32 mmol/L _1 Calcium 8.9 - 10.3 mg/dL 9.0 9.0 8.9  Total Protein 6.5 - 8.1 g/dL 6.9 7.7 7.0  Total Bilirubin 0.3 - 1.2 mg/dL 0.4 0.5 0.4  Alkaline Phos 38 - 126 U/L 78 68 64  AST 15 - 41 U/L _2 ALT 0 - 44 U/L _3 DIAGNOSTIC IMAGING:  I have independently reviewed the scans and discussed with the patient.   I have reviewed Venita Lick LPN's note and agree with the documentation.  I personally performed a face-to-face visit, made revisions and my assessment and plan is as follows.    ASSESSMENT & PLAN:   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HCC) 1.  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  She was found to have an abnormal chest x-ray followed by CT scan which showed left upper lobe lung mass. - She smoked 1 to 2 packs of cigarettes per day for the last 50 years.  She quit smoking on 10/23/2018. - Denies any significant weight loss.  But has night sweats occasionally.  Denies any hemoptysis. - PET CT scan on 11/17/2018 showed left upper lobe lung mass, 4.9 cm, no evidence of adenopathy or metastasis.  MRI of the brain was negative for intracranial mets. - Left upper lobectomy and lymph node biopsy on 12/15/2018.  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   -Cancer type ID testing shows 96% probability of neuroendocrine carcinoma (small cell/large cell carcinoma). -  Because of high risk of recurrence, I have recommended 4 cycles of adjuvant chemotherapy with a small cell cancer based regimen. -2 cycles of carboplatin and etoposide from 01/20/2019 through 02/10/2019. -She had experienced more tiredness after last cycle. -Had some diarrhea which got better without any aggressive treatment. - We reviewed her blood work.  It is adequate to proceed with cycle 3 without any dose modifications. -She will come back in 3 weeks for blood work and cycle 4.   2.  Family history: -Mother had lung cancer and was never smoker.  Father had pancreatic  cancer. -1 sister has colon cancer in 1 sister with leukemia.  Another sister had breast cancer. -[facility] testing on 12/09/2018 was negative for hereditary malignancies.  Total time spent is 25 minutes with more than 50% of the time spent face-to-face discussing treatment related side effects and total treatment plan and coordination of care.    Orders placed this encounter:  No orders of the defined types were placed in this encounter.     Derek Jack, MD [place] [phone]

## 2019-03-02 NOTE — Patient Instructions (Signed)
[place] at [facility] Discharge Instructions  You were seen today by Dr. Delton Coombes. He went over your recent lab results. He will see you back in 3 weeks for labs, treatment and follow up.   Thank you for choosing [place] at [facility] to provide your oncology and hematology care.  To afford each patient quality time with our provider, please arrive at least 15 minutes before your scheduled appointment time.   If you have a lab appointment with the [facility] please come in thru the  Main Entrance and check in at the main information desk  You need to re-schedule your appointment should you arrive 10 or more minutes late.  We strive to give you quality time with our providers, and arriving late affects you and other patients whose appointments are after yours.  Also, if you no show three or more times for appointments you may be dismissed from the clinic at the providers discretion.     Again, thank you for choosing [facility].  Our hope is that these requests will decrease the amount of time that you wait before being seen by our physicians.       _____________________________________________________________  Should you have questions after your visit to [facility], please contact our office at [phone] between the hours of 8:00 a.m. and 4:30 p.m.  Voicemails left after 4:00 p.m. will not be returned until the following business day.  For prescription refill requests, have your pharmacy contact our office and allow 72 hours.    Cancer Center Support Programs:   > Cancer Support Group  2nd Tuesday of the month 1pm-2pm, Journey Room

## 2019-03-02 NOTE — Patient Instructions (Signed)
[facility] Discharge Instructions for Patients Receiving Chemotherapy  Today you received the following chemotherapy agents  If you develop nausea and vomiting that is not controlled by your nausea medication, call the clinic.   BELOW ARE SYMPTOMS THAT SHOULD BE REPORTED IMMEDIATELY:  *FEVER GREATER THAN 100.5 F  *CHILLS WITH OR WITHOUT FEVER  NAUSEA AND VOMITING THAT IS NOT CONTROLLED WITH YOUR NAUSEA MEDICATION  *UNUSUAL SHORTNESS OF BREATH  *UNUSUAL BRUISING OR BLEEDING  TENDERNESS IN MOUTH AND THROAT WITH OR WITHOUT PRESENCE OF ULCERS  *URINARY PROBLEMS  *BOWEL PROBLEMS  UNUSUAL RASH Items with * indicate a potential emergency and should be followed up as soon as possible.  Feel free to call the clinic should you have any questions or concerns. The clinic phone number is [phone].  Please show the CHEMO ALERT CARD at check-in to the Emergency Department and triage nurse.   

## 2019-03-02 NOTE — Assessment & Plan Note (Signed)
1.  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  She was found to have an abnormal chest x-ray followed by CT scan which showed left upper lobe lung mass. - She smoked 1 to 2 packs of cigarettes per day for the last 50 years.  She quit smoking on 10/23/2018. - Denies any significant weight loss.  But has night sweats occasionally.  Denies any hemoptysis. - PET CT scan on 11/17/2018 showed left upper lobe lung mass, 4.9 cm, no evidence of adenopathy or metastasis.  MRI of the brain was negative for intracranial mets. - Left upper lobectomy and lymph node biopsy on 12/15/2018.  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   -Cancer type ID testing shows 96% probability of neuroendocrine carcinoma (small cell/large cell carcinoma). - Because of high risk of recurrence, I have recommended 4 cycles of adjuvant chemotherapy with a small cell cancer based regimen. -2 cycles of carboplatin and etoposide from 01/20/2019 through 02/10/2019. -She had experienced more tiredness after last cycle. -Had some diarrhea which got better without any aggressive treatment. - We reviewed her blood work.  It is adequate to proceed with cycle 3 without any dose modifications. -She will come back in 3 weeks for blood work and cycle 4.   2.  Family history: -Mother had lung cancer and was never smoker.  Father had pancreatic cancer. -1 sister has colon cancer in 1 sister with leukemia.  Another sister had breast cancer. -[employer] testing on 12/09/2018 was negative for hereditary malignancies.

## 2019-03-02 NOTE — Progress Notes (Signed)
Patient seen by Dr. Delton Coombes with lab review and ok to treat today.   Patient tolerated chemotherapy with no complaints voiced.  Port site clean and dry with no bruising or swelling noted at site.  Good blood return noted before and after administration of chemotherapy.  Band aid applied.  Patient left ambulatory with VSS and no s/s of distress noted.

## 2019-03-03 ENCOUNTER — Ambulatory Visit: Payer: Medicare HMO | Admitting: Thoracic Surgery (Cardiothoracic Vascular Surgery)

## 2019-03-03 ENCOUNTER — Encounter (HOSPITAL_COMMUNITY): Payer: Self-pay

## 2019-03-03 ENCOUNTER — Other Ambulatory Visit (HOSPITAL_COMMUNITY): Payer: Self-pay | Admitting: *Deleted

## 2019-03-03 ENCOUNTER — Other Ambulatory Visit: Payer: Self-pay

## 2019-03-03 ENCOUNTER — Inpatient Hospital Stay (HOSPITAL_COMMUNITY): Payer: Medicare HMO

## 2019-03-03 VITALS — BP 156/51 | HR 72 | Temp 98.8°F | Resp 18 | Wt 204.0 lb

## 2019-03-03 DIAGNOSIS — C3412 Malignant neoplasm of upper lobe, left bronchus or lung: Secondary | ICD-10-CM | POA: Diagnosis not present

## 2019-03-03 DIAGNOSIS — R05 Cough: Secondary | ICD-10-CM | POA: Diagnosis not present

## 2019-03-03 DIAGNOSIS — R200 Anesthesia of skin: Secondary | ICD-10-CM | POA: Diagnosis not present

## 2019-03-03 DIAGNOSIS — R5383 Other fatigue: Secondary | ICD-10-CM | POA: Diagnosis not present

## 2019-03-03 DIAGNOSIS — Z5189 Encounter for other specified aftercare: Secondary | ICD-10-CM | POA: Diagnosis not present

## 2019-03-03 DIAGNOSIS — C3490 Malignant neoplasm of unspecified part of unspecified bronchus or lung: Secondary | ICD-10-CM

## 2019-03-03 DIAGNOSIS — Z800 Family history of malignant neoplasm of digestive organs: Secondary | ICD-10-CM | POA: Diagnosis not present

## 2019-03-03 DIAGNOSIS — Z79899 Other long term (current) drug therapy: Secondary | ICD-10-CM | POA: Diagnosis not present

## 2019-03-03 DIAGNOSIS — Z87891 Personal history of nicotine dependence: Secondary | ICD-10-CM | POA: Diagnosis not present

## 2019-03-03 DIAGNOSIS — Z86000 Personal history of in-situ neoplasm of breast: Secondary | ICD-10-CM | POA: Diagnosis not present

## 2019-03-03 MED ORDER — SODIUM CHLORIDE 0.9 % IV SOLN
Freq: Once | INTRAVENOUS | Status: AC
  Administered 2019-03-03: 11:00:00 via INTRAVENOUS

## 2019-03-03 MED ORDER — SODIUM CHLORIDE 0.9% FLUSH: 10.0000 mL | INTRAVENOUS | Status: DC | PRN

## 2019-03-03 MED ORDER — SODIUM CHLORIDE 0.9 % IV SOLN
100.0000 mg/m2 | Freq: Once | INTRAVENOUS | Status: AC
  Administered 2019-03-03: 13:00:00 200 mg via INTRAVENOUS
  Filled 2019-03-03: qty 10

## 2019-03-03 MED ORDER — HEPARIN SOD (PORK) LOCK FLUSH 100 UNIT/ML IV SOLN: 500.0000 [IU] | Freq: Once | INTRAVENOUS | Status: DC | PRN

## 2019-03-03 MED ORDER — OXYCODONE HCL 5 MG PO TABS: 5.0000 mg | ORAL_TABLET | Freq: Four times a day (QID) | ORAL | 0 refills | Status: DC | PRN

## 2019-03-03 MED ORDER — SODIUM CHLORIDE 0.9 % IV SOLN
10.0000 mg | Freq: Once | INTRAVENOUS | Status: AC
  Administered 2019-03-03: 12:00:00 10 mg via INTRAVENOUS
  Filled 2019-03-03: qty 10

## 2019-03-03 NOTE — Patient Instructions (Signed)
[facility] Discharge Instructions for Patients Receiving Chemotherapy  Today you received the following chemotherapy agents   To help prevent nausea and vomiting after your treatment, we encourage you to take your nausea medication   If you develop nausea and vomiting that is not controlled by your nausea medication, call the clinic.   BELOW ARE SYMPTOMS THAT SHOULD BE REPORTED IMMEDIATELY:  *FEVER GREATER THAN 100.5 F  *CHILLS WITH OR WITHOUT FEVER  NAUSEA AND VOMITING THAT IS NOT CONTROLLED WITH YOUR NAUSEA MEDICATION  *UNUSUAL SHORTNESS OF BREATH  *UNUSUAL BRUISING OR BLEEDING  TENDERNESS IN MOUTH AND THROAT WITH OR WITHOUT PRESENCE OF ULCERS  *URINARY PROBLEMS  *BOWEL PROBLEMS  UNUSUAL RASH Items with * indicate a potential emergency and should be followed up as soon as possible.  Feel free to call the clinic should you have any questions or concerns. The clinic phone number is [phone].  Please show the CHEMO ALERT CARD at check-in to the Emergency Department and triage nurse.   

## 2019-03-03 NOTE — Progress Notes (Signed)
Pt presents today for Day 2 VP-16. VSS within parameters for treatment.   Treatment given today per MD orders. Tolerated infusion without adverse affects. Vital signs stable. No complaints at this time. Discharged from clinic ambulatory. Pt requests needle to be removed daily. Pt teaching performed. Understanding verbalized. F/U with [facility] as scheduled.

## 2019-03-04 ENCOUNTER — Encounter (HOSPITAL_COMMUNITY): Payer: Self-pay

## 2019-03-04 ENCOUNTER — Inpatient Hospital Stay (HOSPITAL_COMMUNITY): Payer: Medicare HMO

## 2019-03-04 VITALS — BP 169/56 | HR 67 | Temp 98.5°F | Resp 18 | Wt 208.2 lb

## 2019-03-04 DIAGNOSIS — R05 Cough: Secondary | ICD-10-CM | POA: Diagnosis not present

## 2019-03-04 DIAGNOSIS — Z5189 Encounter for other specified aftercare: Secondary | ICD-10-CM | POA: Diagnosis not present

## 2019-03-04 DIAGNOSIS — R200 Anesthesia of skin: Secondary | ICD-10-CM | POA: Diagnosis not present

## 2019-03-04 DIAGNOSIS — C3412 Malignant neoplasm of upper lobe, left bronchus or lung: Secondary | ICD-10-CM | POA: Diagnosis not present

## 2019-03-04 DIAGNOSIS — C3490 Malignant neoplasm of unspecified part of unspecified bronchus or lung: Secondary | ICD-10-CM

## 2019-03-04 DIAGNOSIS — Z87891 Personal history of nicotine dependence: Secondary | ICD-10-CM | POA: Diagnosis not present

## 2019-03-04 DIAGNOSIS — Z79899 Other long term (current) drug therapy: Secondary | ICD-10-CM | POA: Diagnosis not present

## 2019-03-04 DIAGNOSIS — R5383 Other fatigue: Secondary | ICD-10-CM | POA: Diagnosis not present

## 2019-03-04 DIAGNOSIS — Z86000 Personal history of in-situ neoplasm of breast: Secondary | ICD-10-CM | POA: Diagnosis not present

## 2019-03-04 DIAGNOSIS — Z800 Family history of malignant neoplasm of digestive organs: Secondary | ICD-10-CM | POA: Diagnosis not present

## 2019-03-04 MED ORDER — SODIUM CHLORIDE 0.9 % IV SOLN
100.0000 mg/m2 | Freq: Once | INTRAVENOUS | Status: AC
  Administered 2019-03-04: 200 mg via INTRAVENOUS
  Filled 2019-03-04: qty 10

## 2019-03-04 MED ORDER — HEPARIN SOD (PORK) LOCK FLUSH 100 UNIT/ML IV SOLN
500.0000 [IU] | Freq: Once | INTRAVENOUS | Status: AC | PRN
  Administered 2019-03-04: 500 [IU]

## 2019-03-04 MED ORDER — SODIUM CHLORIDE 0.9 % IV SOLN
Freq: Once | INTRAVENOUS | Status: AC
  Administered 2019-03-04: 09:00:00 via INTRAVENOUS

## 2019-03-04 MED ORDER — SODIUM CHLORIDE 0.9% FLUSH
10.0000 mL | INTRAVENOUS | Status: DC | PRN
  Administered 2019-03-04 (×2): 10 mL
  Filled 2019-03-04 (×2): qty 10

## 2019-03-04 MED ORDER — SODIUM CHLORIDE 0.9 % IV SOLN
10.0000 mg | Freq: Once | INTRAVENOUS | Status: AC
  Administered 2019-03-04: 10 mg via INTRAVENOUS
  Filled 2019-03-04: qty 10

## 2019-03-04 NOTE — Patient Instructions (Signed)
[facility] Discharge Instructions for Patients Receiving Chemotherapy   Beginning January 23rd 2017 lab work for the [facility] will be done in the  Main lab at [facility] on 1st floor. If you have a lab appointment with the [facility] please come in thru the  Main Entrance and check in at the main information desk   Today you received the following chemotherapy agents VP-16. Follow-up as scheduled. Call clinic for any questions or concerns  To help prevent nausea and vomiting after your treatment, we encourage you to take your nausea medication   If you develop nausea and vomiting, or diarrhea that is not controlled by your medication, call the clinic.  The clinic phone number is [phone]. Office hours are Monday-Friday 8:30am-5:00pm.  BELOW ARE SYMPTOMS THAT SHOULD BE REPORTED IMMEDIATELY:  *FEVER GREATER THAN 101.0 F  *CHILLS WITH OR WITHOUT FEVER  NAUSEA AND VOMITING THAT IS NOT CONTROLLED WITH YOUR NAUSEA MEDICATION  *UNUSUAL SHORTNESS OF BREATH  *UNUSUAL BRUISING OR BLEEDING  TENDERNESS IN MOUTH AND THROAT WITH OR WITHOUT PRESENCE OF ULCERS  *URINARY PROBLEMS  *BOWEL PROBLEMS  UNUSUAL RASH Items with * indicate a potential emergency and should be followed up as soon as possible. If you have an emergency after office hours please contact your primary care physician or go to the nearest emergency department.  Please call the clinic during office hours if you have any questions or concerns.   You may also contact the Patient Navigator at [phone] should you have any questions or need assistance in obtaining follow up care.      Resources For Cancer Patients and their Caregivers ? American Cancer Society: Can assist with transportation, wigs, general needs, runs Look Good Feel Better.        954-329-1305 ? Cancer Care: Provides financial assistance, online support groups, medication/co-pay assistance.  1-800-813-HOPE  6301042618) ? [place] Assists [place] cancer patients and their families through emotional , educational and financial support.  (859)737-5834 ? [place] DSS Where to apply for food stamps, Medicaid and utility assistance. 206-484-3912 ? [facility]: Transportation to medical appointments. (404)452-6685 ? Social Security Administration: May apply for disability if have a Stage IV cancer. ([phone] 980-663-2234 ? [employer], Disability and Transit Services: Assists with nutrition, care and transit needs. [phone]

## 2019-03-04 NOTE — Progress Notes (Signed)
Laura Abbott tolerated VP-16 infusion well without complaints or incident. VSS upon discharge. Pt discharged self ambulatory in satisfactory condition

## 2019-03-05 ENCOUNTER — Inpatient Hospital Stay (HOSPITAL_COMMUNITY): Payer: Medicare HMO

## 2019-03-05 ENCOUNTER — Other Ambulatory Visit: Payer: Self-pay

## 2019-03-05 VITALS — BP 151/46 | HR 63 | Temp 97.5°F | Resp 18

## 2019-03-05 DIAGNOSIS — Z800 Family history of malignant neoplasm of digestive organs: Secondary | ICD-10-CM | POA: Diagnosis not present

## 2019-03-05 DIAGNOSIS — C3490 Malignant neoplasm of unspecified part of unspecified bronchus or lung: Secondary | ICD-10-CM

## 2019-03-05 DIAGNOSIS — C3412 Malignant neoplasm of upper lobe, left bronchus or lung: Secondary | ICD-10-CM | POA: Diagnosis not present

## 2019-03-05 DIAGNOSIS — R5383 Other fatigue: Secondary | ICD-10-CM | POA: Diagnosis not present

## 2019-03-05 DIAGNOSIS — R05 Cough: Secondary | ICD-10-CM | POA: Diagnosis not present

## 2019-03-05 DIAGNOSIS — Z5189 Encounter for other specified aftercare: Secondary | ICD-10-CM | POA: Diagnosis not present

## 2019-03-05 DIAGNOSIS — Z87891 Personal history of nicotine dependence: Secondary | ICD-10-CM | POA: Diagnosis not present

## 2019-03-05 DIAGNOSIS — R200 Anesthesia of skin: Secondary | ICD-10-CM | POA: Diagnosis not present

## 2019-03-05 DIAGNOSIS — Z79899 Other long term (current) drug therapy: Secondary | ICD-10-CM | POA: Diagnosis not present

## 2019-03-05 DIAGNOSIS — Z86000 Personal history of in-situ neoplasm of breast: Secondary | ICD-10-CM | POA: Diagnosis not present

## 2019-03-05 MED ORDER — PEGFILGRASTIM-CBQV 6 MG/0.6ML ~~LOC~~ SOSY
6.0000 mg | PREFILLED_SYRINGE | Freq: Once | SUBCUTANEOUS | Status: AC
  Administered 2019-03-05: 6 mg via SUBCUTANEOUS

## 2019-03-05 MED ORDER — PEGFILGRASTIM-CBQV 6 MG/0.6ML ~~LOC~~ SOSY
PREFILLED_SYRINGE | SUBCUTANEOUS | Status: AC
  Filled 2019-03-05: qty 0.6

## 2019-03-23 ENCOUNTER — Inpatient Hospital Stay (HOSPITAL_COMMUNITY): Payer: Medicare HMO

## 2019-03-23 ENCOUNTER — Encounter (HOSPITAL_COMMUNITY): Payer: Self-pay | Admitting: Hematology

## 2019-03-23 ENCOUNTER — Encounter (HOSPITAL_COMMUNITY): Payer: Self-pay

## 2019-03-23 ENCOUNTER — Other Ambulatory Visit: Payer: Self-pay

## 2019-03-23 ENCOUNTER — Inpatient Hospital Stay (HOSPITAL_COMMUNITY): Payer: Medicare HMO | Admitting: Hematology

## 2019-03-23 VITALS — BP 155/56 | HR 75 | Temp 97.5°F | Resp 18 | Wt 204.8 lb

## 2019-03-23 DIAGNOSIS — Z801 Family history of malignant neoplasm of trachea, bronchus and lung: Secondary | ICD-10-CM | POA: Diagnosis not present

## 2019-03-23 DIAGNOSIS — Z79899 Other long term (current) drug therapy: Secondary | ICD-10-CM

## 2019-03-23 DIAGNOSIS — R5383 Other fatigue: Secondary | ICD-10-CM

## 2019-03-23 DIAGNOSIS — R200 Anesthesia of skin: Secondary | ICD-10-CM

## 2019-03-23 DIAGNOSIS — C3412 Malignant neoplasm of upper lobe, left bronchus or lung: Secondary | ICD-10-CM | POA: Diagnosis not present

## 2019-03-23 DIAGNOSIS — C3490 Malignant neoplasm of unspecified part of unspecified bronchus or lung: Secondary | ICD-10-CM

## 2019-03-23 DIAGNOSIS — Z86000 Personal history of in-situ neoplasm of breast: Secondary | ICD-10-CM

## 2019-03-23 DIAGNOSIS — Z800 Family history of malignant neoplasm of digestive organs: Secondary | ICD-10-CM | POA: Diagnosis not present

## 2019-03-23 DIAGNOSIS — Z87891 Personal history of nicotine dependence: Secondary | ICD-10-CM

## 2019-03-23 DIAGNOSIS — Z806 Family history of leukemia: Secondary | ICD-10-CM

## 2019-03-23 DIAGNOSIS — Z5189 Encounter for other specified aftercare: Secondary | ICD-10-CM | POA: Diagnosis not present

## 2019-03-23 DIAGNOSIS — R05 Cough: Secondary | ICD-10-CM | POA: Diagnosis not present

## 2019-03-23 LAB — COMPREHENSIVE METABOLIC PANEL
ALT: 20 U/L (ref 0–44)
AST: 21 U/L (ref 15–41)
Albumin: 3.6 g/dL (ref 3.5–5.0)
Alkaline Phosphatase: 68 U/L (ref 38–126)
Anion gap: 9 (ref 5–15)
BUN: 12 mg/dL (ref 8–23)
CO2: 27 mmol/L (ref 22–32)
Calcium: 8.9 mg/dL (ref 8.9–10.3)
Chloride: 104 mmol/L (ref 98–111)
Creatinine, Ser: 0.54 mg/dL (ref 0.44–1.00)
GFR calc Af Amer: >60 mL/min (ref >60)
GFR calc non Af Amer: >60 mL/min (ref >60)
Glucose, Bld: 107 mg/dL — ABNORMAL HIGH (ref 70–99)
Potassium: 3.9 mmol/L (ref 3.5–5.1)
Sodium: 140 mmol/L (ref 135–145)
Total Bilirubin: 0.3 mg/dL (ref 0.3–1.2)
Total Protein: 6.8 g/dL (ref 6.5–8.1)

## 2019-03-23 LAB — CBC WITH DIFFERENTIAL/PLATELET
Abs Immature Granulocytes: 0.04 10*3/uL (ref 0.00–0.07)
Basophils Absolute: 0.1 10*3/uL (ref 0.0–0.1)
Basophils Relative: 1 %
Eosinophils Absolute: 0 10*3/uL (ref 0.0–0.5)
Eosinophils Relative: 0 %
HCT: 33.1 % — ABNORMAL LOW (ref 36.0–46.0)
Hemoglobin: 10.1 g/dL — ABNORMAL LOW (ref 12.0–15.0)
Immature Granulocytes: 1 %
Lymphocytes Relative: 22 %
Lymphs Abs: 1.9 10*3/uL (ref 0.7–4.0)
MCH: 28.7 pg (ref 26.0–34.0)
MCHC: 30.5 g/dL (ref 30.0–36.0)
MCV: 94 fL (ref 80.0–100.0)
Monocytes Absolute: 1 10*3/uL (ref 0.1–1.0)
Monocytes Relative: 12 %
Neutro Abs: 5.6 10*3/uL (ref 1.7–7.7)
Neutrophils Relative %: 64 %
Platelets: 405 10*3/uL — ABNORMAL HIGH (ref 150–400)
RBC: 3.52 MIL/uL — ABNORMAL LOW (ref 3.87–5.11)
RDW: 19.8 % — ABNORMAL HIGH (ref 11.5–15.5)
WBC: 8.6 10*3/uL (ref 4.0–10.5)
nRBC: 0 % (ref 0.0–0.2)

## 2019-03-23 LAB — MAGNESIUM: Magnesium: 1.7 mg/dL (ref 1.7–2.4)

## 2019-03-23 MED ORDER — PALONOSETRON HCL INJECTION 0.25 MG/5ML
INTRAVENOUS | Status: AC
  Filled 2019-03-23: qty 5

## 2019-03-23 MED ORDER — PALONOSETRON HCL INJECTION 0.25 MG/5ML
0.2500 mg | Freq: Once | INTRAVENOUS | Status: AC
  Administered 2019-03-23: 10:00:00 0.25 mg via INTRAVENOUS

## 2019-03-23 MED ORDER — SODIUM CHLORIDE 0.9 % IV SOLN
10.0000 mg | Freq: Once | INTRAVENOUS | Status: AC
  Administered 2019-03-23: 10 mg via INTRAVENOUS
  Filled 2019-03-23: qty 10

## 2019-03-23 MED ORDER — SODIUM CHLORIDE 0.9 % IV SOLN
504.5000 mg | Freq: Once | INTRAVENOUS | Status: AC
  Administered 2019-03-23: 500 mg via INTRAVENOUS
  Filled 2019-03-23: qty 50

## 2019-03-23 MED ORDER — SODIUM CHLORIDE 0.9 % IV SOLN
100.0000 mg/m2 | Freq: Once | INTRAVENOUS | Status: AC
  Administered 2019-03-23: 200 mg via INTRAVENOUS
  Filled 2019-03-23: qty 10

## 2019-03-23 MED ORDER — HEPARIN SOD (PORK) LOCK FLUSH 100 UNIT/ML IV SOLN
500.0000 [IU] | Freq: Once | INTRAVENOUS | Status: AC | PRN
  Administered 2019-03-23: 500 [IU]

## 2019-03-23 MED ORDER — SODIUM CHLORIDE 0.9 % IV SOLN
Freq: Once | INTRAVENOUS | Status: AC
  Administered 2019-03-23: 10:00:00 via INTRAVENOUS

## 2019-03-23 MED ORDER — SODIUM CHLORIDE 0.9% FLUSH
10.0000 mL | INTRAVENOUS | Status: DC | PRN
  Administered 2019-03-23: 14:00:00 10 mL
  Filled 2019-03-23: qty 10

## 2019-03-23 NOTE — Patient Instructions (Signed)
[facility] Discharge Instructions for Patients Receiving Chemotherapy  Today you received the following chemotherapy agents   To help prevent nausea and vomiting after your treatment, we encourage you to take your nausea medication   If you develop nausea and vomiting that is not controlled by your nausea medication, call the clinic.   BELOW ARE SYMPTOMS THAT SHOULD BE REPORTED IMMEDIATELY:  *FEVER GREATER THAN 100.5 F  *CHILLS WITH OR WITHOUT FEVER  NAUSEA AND VOMITING THAT IS NOT CONTROLLED WITH YOUR NAUSEA MEDICATION  *UNUSUAL SHORTNESS OF BREATH  *UNUSUAL BRUISING OR BLEEDING  TENDERNESS IN MOUTH AND THROAT WITH OR WITHOUT PRESENCE OF ULCERS  *URINARY PROBLEMS  *BOWEL PROBLEMS  UNUSUAL RASH Items with * indicate a potential emergency and should be followed up as soon as possible.  Feel free to call the clinic should you have any questions or concerns. The clinic phone number is [phone].  Please show the CHEMO ALERT CARD at check-in to the Emergency Department and triage nurse.   

## 2019-03-23 NOTE — Progress Notes (Signed)
Treatment given today per MD orders. Tolerated infusion without adverse affects. Vital signs stable. No complaints at this time. Discharged from clinic ambulatory. F/U with [facility] as scheduled.   

## 2019-03-23 NOTE — Patient Instructions (Signed)
[facility] Discharge Instructions  You were seen today by Dr. Katragadda. He went over your recent lab results. He will see you back in  for labs and follow up.   Thank you for choosing  Cancer Center at [facility] to provide your oncology and hematology care.  To afford each patient quality time with our provider, please arrive at least 15 minutes before your scheduled appointment time.   If you have a lab appointment with the Cancer Center please come in thru the  Main Entrance and check in at the main information desk  You need to re-schedule your appointment should you arrive 10 or more minutes late.  We strive to give you quality time with our providers, and arriving late affects you and other patients whose appointments are after yours.  Also, if you no show three or more times for appointments you may be dismissed from the clinic at the providers discretion.     Again, thank you for choosing [facility].  Our hope is that these requests will decrease the amount of time that you wait before being seen by our physicians.       _____________________________________________________________  Should you have questions after your visit to [facility], please contact our office at [phone] between the hours of 8:00 a.m. and 4:30 p.m.  Voicemails left after 4:00 p.m. will not be returned until the following business day.  For prescription refill requests, have your pharmacy contact our office and allow 72 hours.    Cancer Center Support Programs:   > Cancer Support Group  2nd Tuesday of the month 1pm-2pm, Journey Room    

## 2019-03-23 NOTE — Progress Notes (Signed)
[place] [address], [place] [address]   CLINIC:  Medical Oncology/Hematology  PCP:  Glenda Chroman, MD [place] [address] [phone]   REASON FOR VISIT:  Follow-up for neuroendocrinelung cancer   BRIEF ONCOLOGIC HISTORY:    DCIS (ductal carcinoma in situ) of breast   10/28/2013 Initial Diagnosis    DCIS (ductal carcinoma in situ) of breast    12/21/2018 Genetic Testing    Negative genetic testing on the multicancer panel.  The Multi-Gene Panel offered by [facility] includes sequencing and/or deletion duplication testing of the following 84 genes: AIP, ALK, APC, ATM, AXIN2,BAP1,  BARD1, BLM, BMPR1A, BRCA1, BRCA2, BRIP1, CASR, CDC73, CDH1, CDK4, CDKN1B, CDKN1C, CDKN2A (p14ARF), CDKN2A (p16INK4a), CEBPA, CHEK2, CTNNA1, DICER1, DIS3L2, EGFR (c.2369C>T, p.Thr790Met variant only), EPCAM (Deletion/duplication testing only), FH, FLCN, GATA2, GPC3, GREM1 (Promoter region deletion/duplication testing only), HOXB13 (c.251G>A, p.Gly84Glu), HRAS, KIT, MAX, MEN1, MET, MITF (c.952G>A, p.Glu318Lys variant only), MLH1, MSH2, MSH3, MSH6, MUTYH, NBN, NF1, NF2, NTHL1, PALB2, PDGFRA, PHOX2B, PMS2, POLD1, POLE, POT1, PRKAR1A, PTCH1, PTEN, RAD50, RAD51C, RAD51D, RB1, RECQL4, RET, RUNX1, SDHAF2, SDHA (sequence changes only), SDHB, SDHC, SDHD, SMAD4, SMARCA4, SMARCB1, SMARCE1, STK11, SUFU, TERC, TERT, TMEM127, TP53, TSC1, TSC2, VHL, WRN and WT1.  The report date is 12/21/2018.     Lung cancer ([place])   12/15/2018 Initial Diagnosis    Lung cancer ([employer])    12/21/2018 Genetic Testing    Negative genetic testing on the multicancer panel.  The Multi-Gene Panel offered by [facility] includes sequencing and/or deletion duplication testing of the following 84 genes: AIP, ALK, APC, ATM, AXIN2,BAP1,  BARD1, BLM, BMPR1A, BRCA1, BRCA2, BRIP1, CASR, CDC73, CDH1, CDK4, CDKN1B, CDKN1C, CDKN2A (p14ARF), CDKN2A (p16INK4a), CEBPA, CHEK2, CTNNA1, DICER1, DIS3L2, EGFR (c.2369C>T, p.Thr790Met variant  only), EPCAM (Deletion/duplication testing only), FH, FLCN, GATA2, GPC3, GREM1 (Promoter region deletion/duplication testing only), HOXB13 (c.251G>A, p.Gly84Glu), HRAS, KIT, MAX, MEN1, MET, MITF (c.952G>A, p.Glu318Lys variant only), MLH1, MSH2, MSH3, MSH6, MUTYH, NBN, NF1, NF2, NTHL1, PALB2, PDGFRA, PHOX2B, PMS2, POLD1, POLE, POT1, PRKAR1A, PTCH1, PTEN, RAD50, RAD51C, RAD51D, RB1, RECQL4, RET, RUNX1, SDHAF2, SDHA (sequence changes only), SDHB, SDHC, SDHD, SMAD4, SMARCA4, SMARCB1, SMARCE1, STK11, SUFU, TERC, TERT, TMEM127, TP53, TSC1, TSC2, VHL, WRN and WT1.  The report date is 12/21/2018.     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  The Multi-Gene Panel offered by [facility] includes sequencing and/or deletion duplication testing of the following 84 genes: AIP, ALK, APC, ATM, AXIN2,BAP1,  BARD1, BLM, BMPR1A, BRCA1, BRCA2, BRIP1, CASR, CDC73, CDH1, CDK4, CDKN1B, CDKN1C, CDKN2A (p14ARF), CDKN2A (p16INK4a), CEBPA, CHEK2, CTNNA1, DICER1, DIS3L2, EGFR (c.2369C>T, p.Thr790Met variant only), EPCAM (Deletion/duplication testing only), FH, FLCN, GATA2, GPC3, GREM1 (Promoter region deletion/duplication testing only), HOXB13 (c.251G>A, p.Gly84Glu), HRAS, KIT, MAX, MEN1, MET, MITF (c.952G>A, p.Glu318Lys variant only), MLH1, MSH2, MSH3, MSH6, MUTYH, NBN, NF1, NF2, NTHL1, PALB2, PDGFRA, PHOX2B, PMS2, POLD1, POLE, POT1, PRKAR1A, PTCH1, PTEN, RAD50, RAD51C, RAD51D, RB1, RECQL4, RET, RUNX1, SDHAF2, SDHA (sequence changes only), SDHB, SDHC, SDHD, SMAD4, SMARCA4, SMARCB1, SMARCE1, STK11, SUFU, TERC, TERT, TMEM127, TP53, TSC1, TSC2, VHL, WRN and WT1.  The report date is 12/21/2018.     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[place])   01/12/2019 Initial Diagnosis    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[place])    01/20/2019 -  Chemotherapy    The patient had  palonosetron (ALOXI) injection 0.25 mg,  0.25 mg, Intravenous,  Once, 4 of 4 cycles Administration: 0.25 mg (01/20/2019), 0.25 mg (02/10/2019), 0.25 mg (03/02/2019), 0.25 mg (03/23/2019) pegfilgrastim-cbqv (UDENYCA) injection 6 mg, 6 mg, Subcutaneous, Once, 4 of 4 cycles Administration: 6 mg (01/23/2019), 6 mg (02/13/2019), 6 mg (03/05/2019) CARBOplatin (PARAPLATIN) 500 mg in sodium chloride 0.9 % 250 mL chemo infusion, 500 mg (100 % of original dose 504.5 mg), Intravenous,  Once, 4 of 4 cycles Dose modification:   (original dose 504.5 mg, Cycle 1),   (original dose 504.5 mg, Cycle 2),   (original dose 504.5 mg, Cycle 3),   (original dose 504.5 mg, Cycle 4) Administration: 500 mg (01/20/2019), 500 mg (02/10/2019), 500 mg (03/02/2019), 500 mg (03/23/2019) etoposide (VEPESID) 200 mg in sodium chloride 0.9 % 500 mL chemo infusion, 100 mg/m2 = 200 mg, Intravenous,  Once, 4 of 4 cycles Administration: 200 mg (01/20/2019), 200 mg (01/21/2019), 200 mg (01/22/2019), 200 mg (02/10/2019), 200 mg (02/11/2019), 200 mg (02/12/2019), 200 mg (03/02/2019), 200 mg (03/03/2019), 200 mg (03/04/2019), 200 mg (03/23/2019)  for chemotherapy treatment.       CANCER STAGING: Cancer Staging DCIS (ductal carcinoma in situ) of breast Staging form: Breast, AJCC 7th Edition - Clinical stage from 12/01/2013: Stage 0 (Tis (DCIS), N0, cM0) - Signed by Heath Lark, MD on 12/01/2013 - Pathologic: Stage 0 (Tis (DCIS), N0, cM0) - Signed by Heath Lark, MD on 12/01/2013    INTERVAL HISTORY:  Ms. Dannemiller 73 y.o. female returns for routine follow-up. She is here today alone. She states that she had a fall recently. Denies any nausea, vomiting, or diarrhea. Denies any new pains. Had not noticed any recent bleeding such as epistaxis, hematuria or hematochezia. Denies recent chest pain on exertion, shortness of breath on minimal exertion, pre-syncopal episodes, or palpitations. Denies any numbness or tingling in hands or feet. Denies any recent fevers, infections, or recent hospitalizations. Patient reports  appetite at 75% and energy level at 75%.    REVIEW OF SYSTEMS:  Review of Systems  Constitutional: Positive for fatigue.  Respiratory: Positive for cough.   Neurological: Positive for numbness.  All other systems reviewed and are negative.    PAST MEDICAL/SURGICAL HISTORY:  Past Medical History:  Diagnosis Date   Anxiety    Arthritis    Asthma    Breast cancer ([place])    breast, right DCIS   COPD (chronic obstructive pulmonary disease) ([place])    COPD exacerbation ([place]) 01/03/2014   With hypoxia   Depression    Emphysema of lung ([place])    Family history of breast cancer    Family history of colon cancer    Family history of kidney cancer    Family history of lung cancer    Family history of pancreatic cancer    GERD (gastroesophageal reflux disease)    History of kidney stones    Hyperlipidemia    Hypertension    Obesity 01/05/2014   Osteoporosis    Osteoporosis, unspecified 12/01/2013   Pneumonia    x2   Tobacco abuse 12/01/2013   Past Surgical History:  Procedure Laterality Date   ABDOMINAL HYSTERECTOMY     BLADDER SURGERY     had tacked   BREAST LUMPECTOMY WITH NEEDLE LOCALIZATION AND AXILLARY SENTINEL LYMPH NODE BX Right 11/09/2013   Procedure: BREAST LUMPECTOMY WITH NEEDLE LOCALIZATION AND AXILLARY SENTINEL LYMPH NODE BIOPSY;  Surgeon: Harl Bowie, MD;  Location: [facility];  Service: General;  Laterality: Right;   BREAST SURGERY  Right    lumpectomy   CHOLECYSTECTOMY     NODE DISSECTION Left 12/15/2018   Procedure: NODE DISSECTION LEFT LUNG;  Surgeon: Melrose Nakayama, MD;  Location: [facility];  Service: Thoracic;  Laterality: Left;   PORTACATH PLACEMENT Right 01/19/2019   Procedure: INSERTION PORT-A-CATH;  Surgeon: Aviva Signs, MD;  Location: [facility];  Service: General;  Laterality: Right;   VIDEO ASSISTED THORACOSCOPY (VATS)/ LOBECTOMY Left 12/15/2018   Procedure: VIDEO ASSISTED THORACOSCOPY (VATS)/LEFT UPPER LOBECTOMY;  Surgeon:  Melrose Nakayama, MD;  Location: [facility] OR;  Service: Thoracic;  Laterality: Left;     SOCIAL HISTORY:  Social History   Socioeconomic History   Marital status: Married    Spouse name: Not on file   Number of children: 3   Years of education: Not on file   Highest education level: Not on file  Occupational History   Occupation: Database administrator    Comment: [employer]    Comment: [place]    CommentResearch scientist (life sciences) strain: Not hard at [employer] insecurity:    Worry: Never true    Inability: Never true   Transportation needs:    Medical: No    Non-medical: No  Tobacco Use   Smoking status: Former Smoker    Packs/day: 0.50    Years: 43.00    Pack years: 21.50    Types: Cigarettes    Last attempt to quit: 10/24/2018    Years since quitting: 0.4   Smokeless tobacco: Never Used  Substance and Sexual Activity   Alcohol use: No   Drug use: No   Sexual activity: Not Currently  Lifestyle   Physical activity:    Days per week: 0 days    Minutes per session: 0 min   Stress: Rather much  Relationships   Social connections:    Talks on phone: More than three times a week    Gets together: More than three times a week    Attends religious service: More than 4 times per year    Active member of club or organization: Yes    Attends meetings of clubs or organizations: More than 4 times per year    Relationship status: Married   Intimate partner violence:    Fear of current or ex partner: No    Emotionally abused: No    Physically abused: No    Forced sexual activity: No  Other Topics Concern   Not on file  Social History Narrative   Not on file    FAMILY HISTORY:  Family History  Problem Relation Age of Onset   Lung cancer Mother 61       non smoker   Pancreatic cancer Father 63       d. 14   Leukemia Sister        dx in her 62s   Heart disease Sister    Colon cancer Sister        dx and died in her  84s   Heart disease Sister    Lung cancer Maternal Aunt        non-smoker   Heart disease Sister    Breast cancer Sister        dx early 4s   Heart disease Brother    Cirrhosis Brother        d. 30   Aneurysm Brother        d. 39s   Fibromyalgia Daughter  Kidney cancer Nephew 71   Kidney cancer Nephew        dx in 56s; great nephew   Leukemia Nephew    Liver disease Nephew    Other Niece        white matter on brain; dx late 54s    CURRENT MEDICATIONS:  Outpatient Encounter Medications as of 03/23/2019  Medication Sig   albuterol (PROVENTIL HFA;VENTOLIN HFA) 108 (90 BASE) MCG/ACT inhaler Inhale 2 puffs into the lungs 3 (three) times daily. Also use the inhaler every 2 hours if needed for worsening shortness of breath and wheezing. (Patient taking differently: Inhale 2 puffs into the lungs 3 (three) times daily as needed for wheezing or shortness of breath. )   Calcium 600-200 MG-UNIT per tablet Take 1 tablet by mouth daily.   CARBOPLATIN IV Inject into the vein every 21 ( twenty-one) days.   cephALEXin (KEFLEX) 500 MG capsule    clonazePAM (KLONOPIN) 0.5 MG tablet Take 0.5 mg by mouth at bedtime.    ETOPOSIDE IV Inject into the vein every 21 ( twenty-one) days.   FLUoxetine (PROZAC) 40 MG capsule Take 40 mg by mouth daily.   fluticasone (FLONASE) 50 MCG/ACT nasal spray Place 2 sprays into both nostrils daily.   fluticasone furoate-vilanterol (BREO ELLIPTA) 100-25 MCG/INH AEPB Inhale 1 puff into the lungs daily.   hydrOXYzine (ATARAX/VISTARIL) 25 MG tablet    lidocaine-prilocaine (EMLA) cream Apply 1 application topically as needed. Apply pea sized amount to port site 1 hour prior to appointment and cover with plastic wrap   lisinopril (PRINIVIL,ZESTRIL) 10 MG tablet Take 10 mg by mouth daily.   metoprolol (LOPRESSOR) 50 MG tablet Take 50-75 mg by mouth 2 (two) times daily. Takes 75 mg in the morning and 50 mg in the evening   nabumetone (RELAFEN) 500  MG tablet Take 500 mg by mouth every morning.   omeprazole (PRILOSEC) 40 MG capsule Take 40 mg by mouth daily.    oxyCODONE (OXY IR/ROXICODONE) 5 MG immediate release tablet Take 1 tablet (5 mg total) by mouth every 6 (six) hours as needed for severe pain.   prochlorperazine (COMPAZINE) 10 MG tablet Take 1 tablet (10 mg total) by mouth every 6 (six) hours as needed for nausea or vomiting.   traZODone (DESYREL) 50 MG tablet Take 50 mg by mouth at bedtime.   triamcinolone cream (KENALOG) 0.1 %    Facility-Administered Encounter Medications as of 03/23/2019  Medication   sodium chloride flush (NS) 0.9 % injection 10 mL    ALLERGIES:  No Known Allergies   PHYSICAL EXAM:  ECOG Performance status: 1  Blood pressure is 160/51.  Pulse rate is 68.  Respirate is 18.  Temperature 98.1.  Saturations 96%. Physical Exam Vitals signs reviewed.  Constitutional:      Appearance: Normal appearance.  Cardiovascular:     Rate and Rhythm: Normal rate and regular rhythm.     Heart sounds: Normal heart sounds.  Pulmonary:     Effort: Pulmonary effort is normal.     Breath sounds: Normal breath sounds.  Abdominal:     General: There is no distension.     Palpations: Abdomen is soft. There is no mass.  Musculoskeletal:        General: No swelling.  Skin:    General: Skin is warm.  Neurological:     General: No focal deficit present.     Mental Status: She is alert and oriented to person, place, and time.  Psychiatric:  Mood and Affect: Mood normal.        Behavior: Behavior normal.      LABORATORY DATA:  I have reviewed the labs as listed.  CBC    Component Value Date/Time   WBC 8.6 03/23/2019 0823   RBC 3.52 (L) 03/23/2019 0823   HGB 10.1 (L) 03/23/2019 0823   HCT 33.1 (L) 03/23/2019 0823   PLT 405 (H) 03/23/2019 0823   MCV 94.0 03/23/2019 0823   MCH 28.7 03/23/2019 0823   MCHC 30.5 03/23/2019 0823   RDW 19.8 (H) 03/23/2019 0823   LYMPHSABS 1.9 03/23/2019 0823    MONOABS 1.0 03/23/2019 0823   EOSABS 0.0 03/23/2019 0823   BASOSABS 0.1 03/23/2019 0823   CMP Latest Ref Rng & Units 03/23/2019 03/02/2019 02/10/2019  Glucose 70 - 99 mg/dL 107(H) 146(H) 122(H)  BUN 8 - 23 mg/dL _0 Creatinine 0.44 - 1.00 mg/dL 0.54 0.53 0.48  Sodium 135 - 145 mmol/L 140 140 140  Potassium 3.5 - 5.1 mmol/L 3.9 3.6 4.1  Chloride 98 - 111 mmol/L 104 105 103  CO2 22 - 32 mmol/L _1 Calcium 8.9 - 10.3 mg/dL 8.9 9.0 9.0  Total Protein 6.5 - 8.1 g/dL 6.8 6.9 7.7  Total Bilirubin 0.3 - 1.2 mg/dL 0.3 0.4 0.5  Alkaline Phos 38 - 126 U/L 68 78 68  AST 15 - 41 U/L _2 ALT 0 - 44 U/L _3 DIAGNOSTIC IMAGING:  I have independently reviewed the scans and discussed with the patient.   I have reviewed Venita Lick LPN's note and agree with the documentation.  I personally performed a face-to-face visit, made revisions and my assessment and plan is as follows.    ASSESSMENT & PLAN:   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HCC) 1.  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  She was found to have an abnormal chest x-ray followed by CT scan which showed left upper lobe lung mass. - She smoked 1 to 2 packs of cigarettes per day for the last 50 years.  She quit smoking on 10/23/2018. - Denies any significant weight loss.  But has night sweats occasionally.  Denies any hemoptysis. - PET CT scan on 11/17/2018 showed left upper lobe lung mass, 4.9 cm, no evidence of adenopathy or metastasis.  MRI of the brain was negative for intracranial mets. - Left upper lobectomy and lymph node biopsy on 12/15/2018.  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   -Cancer type ID testing shows 96% probability of neuroendocrine carcinoma (small cell/large cell carcinoma). - Because of high risk of recurrence,  I have recommended 4 cycles of adjuvant chemotherapy with a small cell cancer based regimen. -4 cycles of carboplatin and etoposide from 01/20/2019 through 03/23/2019.   -She had some tiredness after her last cycle.  Denied any major weight changes. - I have reviewed her blood work.  She may proceed with her cycle 4 today.  This will conclude her recommended adjuvant therapy. -I will see her back in 4 weeks for follow-up.  I plan to repeat a CT of the chest with contrast prior to next visit. -I am also discussed with her about the surveillance plan.   2.  Family history: -Mother had lung cancer and was  never smoker.  Father had pancreatic cancer. -1 sister has colon cancer in 1 sister with leukemia.  Another sister had breast cancer. -[facility] testing on 12/09/2018 was negative for hereditary malignancies.  Total time spent is 25 minutes with more than 50% of the time spent face-to-face discussing treatment plan, side effects and coordination of care and follow-up care.  Orders placed this encounter:  Orders Placed This Encounter  Procedures   [place], MD [place] [phone]

## 2019-03-23 NOTE — Assessment & Plan Note (Signed)
1.  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  She was found to have an abnormal chest x-ray followed by CT scan which showed left upper lobe lung mass. - She smoked 1 to 2 packs of cigarettes per day for the last 50 years.  She quit smoking on 10/23/2018. - Denies any significant weight loss.  But has night sweats occasionally.  Denies any hemoptysis. - PET CT scan on 11/17/2018 showed left upper lobe lung mass, 4.9 cm, no evidence of adenopathy or metastasis.  MRI of the brain was negative for intracranial mets. - Left upper lobectomy and lymph node biopsy on 12/15/2018.  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   -Cancer type ID testing shows 96% probability of neuroendocrine carcinoma (small cell/large cell carcinoma). - Because of high risk of recurrence, I have recommended 4 cycles of adjuvant chemotherapy with a small cell cancer based regimen. -4 cycles of carboplatin and etoposide from 01/20/2019 through 03/23/2019.   -She had some tiredness after her last cycle.  Denied any major weight changes. - I have reviewed her blood work.  She may proceed with her cycle 4 today.  This will conclude her recommended adjuvant therapy. -I will see her back in 4 weeks for follow-up.  I plan to repeat a CT of the chest with contrast prior to next visit. -I am also discussed with her about the surveillance plan.   2.  Family history: -Mother had lung cancer and was never smoker.  Father had pancreatic cancer. -1 sister has colon cancer in 1 sister with leukemia.  Another sister had breast cancer. -[facility] testing on 12/09/2018 was negative for hereditary malignancies.

## 2019-03-24 ENCOUNTER — Encounter (HOSPITAL_COMMUNITY): Payer: Self-pay

## 2019-03-24 ENCOUNTER — Inpatient Hospital Stay (HOSPITAL_COMMUNITY): Payer: Medicare HMO

## 2019-03-24 VITALS — BP 174/59 | HR 67 | Temp 98.0°F | Resp 18

## 2019-03-24 DIAGNOSIS — Z79899 Other long term (current) drug therapy: Secondary | ICD-10-CM | POA: Diagnosis not present

## 2019-03-24 DIAGNOSIS — C3490 Malignant neoplasm of unspecified part of unspecified bronchus or lung: Secondary | ICD-10-CM

## 2019-03-24 DIAGNOSIS — Z5189 Encounter for other specified aftercare: Secondary | ICD-10-CM | POA: Diagnosis not present

## 2019-03-24 DIAGNOSIS — Z87891 Personal history of nicotine dependence: Secondary | ICD-10-CM | POA: Diagnosis not present

## 2019-03-24 DIAGNOSIS — R5383 Other fatigue: Secondary | ICD-10-CM | POA: Diagnosis not present

## 2019-03-24 DIAGNOSIS — Z800 Family history of malignant neoplasm of digestive organs: Secondary | ICD-10-CM | POA: Diagnosis not present

## 2019-03-24 DIAGNOSIS — Z86000 Personal history of in-situ neoplasm of breast: Secondary | ICD-10-CM | POA: Diagnosis not present

## 2019-03-24 DIAGNOSIS — C3412 Malignant neoplasm of upper lobe, left bronchus or lung: Secondary | ICD-10-CM | POA: Diagnosis not present

## 2019-03-24 DIAGNOSIS — R200 Anesthesia of skin: Secondary | ICD-10-CM | POA: Diagnosis not present

## 2019-03-24 DIAGNOSIS — R05 Cough: Secondary | ICD-10-CM | POA: Diagnosis not present

## 2019-03-24 MED ORDER — SODIUM CHLORIDE 0.9 % IV SOLN
100.0000 mg/m2 | Freq: Once | INTRAVENOUS | Status: AC
  Administered 2019-03-24: 200 mg via INTRAVENOUS
  Filled 2019-03-24: qty 10

## 2019-03-24 MED ORDER — SODIUM CHLORIDE 0.9 % IV SOLN
10.0000 mg | Freq: Once | INTRAVENOUS | Status: AC
  Administered 2019-03-24: 10 mg via INTRAVENOUS
  Filled 2019-03-24: qty 10

## 2019-03-24 MED ORDER — HEPARIN SOD (PORK) LOCK FLUSH 100 UNIT/ML IV SOLN
500.0000 [IU] | Freq: Once | INTRAVENOUS | Status: AC | PRN
  Administered 2019-03-24: 13:00:00 500 [IU]

## 2019-03-24 MED ORDER — SODIUM CHLORIDE 0.9% FLUSH
10.0000 mL | INTRAVENOUS | Status: DC | PRN
  Administered 2019-03-24: 10 mL
  Filled 2019-03-24: qty 10

## 2019-03-24 MED ORDER — SODIUM CHLORIDE 0.9 % IV SOLN
Freq: Once | INTRAVENOUS | Status: AC
  Administered 2019-03-24: 11:00:00 via INTRAVENOUS

## 2019-03-24 NOTE — Patient Instructions (Signed)
[facility] Discharge Instructions for Patients Receiving Chemotherapy   Beginning January 23rd 2017 lab work for the [facility] will be done in the  Main lab at [facility] on 1st floor. If you have a lab appointment with the [place] please come in thru the  Main Entrance and check in at the main information desk   Today you received the following chemotherapy agents VP-16.Follow-up as scheduled. Call clinic for any questions or concerns  To help prevent nausea and vomiting after your treatment, we encourage you to take your nausea medication   If you develop nausea and vomiting, or diarrhea that is not controlled by your medication, call the clinic.  The clinic phone number is [phone]. Office hours are Monday-Friday 8:30am-5:00pm.  BELOW ARE SYMPTOMS THAT SHOULD BE REPORTED IMMEDIATELY:  *FEVER GREATER THAN 101.0 F  *CHILLS WITH OR WITHOUT FEVER  NAUSEA AND VOMITING THAT IS NOT CONTROLLED WITH YOUR NAUSEA MEDICATION  *UNUSUAL SHORTNESS OF BREATH  *UNUSUAL BRUISING OR BLEEDING  TENDERNESS IN MOUTH AND THROAT WITH OR WITHOUT PRESENCE OF ULCERS  *URINARY PROBLEMS  *BOWEL PROBLEMS  UNUSUAL RASH Items with * indicate a potential emergency and should be followed up as soon as possible. If you have an emergency after office hours please contact your primary care physician or go to the nearest emergency department.  Please call the clinic during office hours if you have any questions or concerns.   You may also contact the Patient Navigator at [phone] should you have any questions or need assistance in obtaining follow up care.      Resources For Cancer Patients and their Caregivers ? American Cancer Society: Can assist with transportation, wigs, general needs, runs Look Good Feel Better.        ([phone] ? Cancer Care: Provides financial assistance, online support groups, medication/co-pay assistance.  1-800-813-HOPE  859-217-5888) ? [facility] Assists [place] cancer patients and their families through emotional , educational and financial support.  316-559-0359 ? [place] DSS Where to apply for food stamps, Medicaid and utility assistance. 8626880037 ? [facility]: Transportation to medical appointments. 772-736-9682 ? Social Security Administration: May apply for disability if have a Stage IV cancer. (801)574-8845 (563)460-8735 ? [employer], Disability and Transit Services: Assists with nutrition, care and transit needs. [phone]

## 2019-03-24 NOTE — Progress Notes (Signed)
Laura Abbott tolerated VP-16 infusion well without complaints or incident. VSS upon discharge. Pt discharged self ambulatory using her walker in satisfactory condition

## 2019-03-25 ENCOUNTER — Encounter (HOSPITAL_COMMUNITY): Payer: Self-pay

## 2019-03-25 ENCOUNTER — Other Ambulatory Visit: Payer: Self-pay

## 2019-03-25 ENCOUNTER — Inpatient Hospital Stay (HOSPITAL_COMMUNITY): Payer: Medicare HMO

## 2019-03-25 VITALS — BP 176/54 | HR 65 | Temp 98.0°F | Resp 19

## 2019-03-25 DIAGNOSIS — Z79899 Other long term (current) drug therapy: Secondary | ICD-10-CM | POA: Diagnosis not present

## 2019-03-25 DIAGNOSIS — R05 Cough: Secondary | ICD-10-CM | POA: Diagnosis not present

## 2019-03-25 DIAGNOSIS — Z5189 Encounter for other specified aftercare: Secondary | ICD-10-CM | POA: Diagnosis not present

## 2019-03-25 DIAGNOSIS — C3490 Malignant neoplasm of unspecified part of unspecified bronchus or lung: Secondary | ICD-10-CM

## 2019-03-25 DIAGNOSIS — C3412 Malignant neoplasm of upper lobe, left bronchus or lung: Secondary | ICD-10-CM | POA: Diagnosis not present

## 2019-03-25 DIAGNOSIS — R200 Anesthesia of skin: Secondary | ICD-10-CM | POA: Diagnosis not present

## 2019-03-25 DIAGNOSIS — Z86000 Personal history of in-situ neoplasm of breast: Secondary | ICD-10-CM | POA: Diagnosis not present

## 2019-03-25 DIAGNOSIS — Z800 Family history of malignant neoplasm of digestive organs: Secondary | ICD-10-CM | POA: Diagnosis not present

## 2019-03-25 DIAGNOSIS — R5383 Other fatigue: Secondary | ICD-10-CM | POA: Diagnosis not present

## 2019-03-25 DIAGNOSIS — Z87891 Personal history of nicotine dependence: Secondary | ICD-10-CM | POA: Diagnosis not present

## 2019-03-25 MED ORDER — SODIUM CHLORIDE 0.9 % IV SOLN
100.0000 mg/m2 | Freq: Once | INTRAVENOUS | Status: AC
  Administered 2019-03-25: 200 mg via INTRAVENOUS
  Filled 2019-03-25: qty 10

## 2019-03-25 MED ORDER — SODIUM CHLORIDE 0.9% FLUSH
10.0000 mL | INTRAVENOUS | Status: DC | PRN
  Administered 2019-03-25: 10:00:00 10 mL
  Filled 2019-03-25: qty 10

## 2019-03-25 MED ORDER — SODIUM CHLORIDE 0.9 % IV SOLN
Freq: Once | INTRAVENOUS | Status: AC
  Administered 2019-03-25: 10:00:00 via INTRAVENOUS

## 2019-03-25 MED ORDER — HEPARIN SOD (PORK) LOCK FLUSH 100 UNIT/ML IV SOLN
500.0000 [IU] | Freq: Once | INTRAVENOUS | Status: AC | PRN
  Administered 2019-03-25: 500 [IU]

## 2019-03-25 MED ORDER — SODIUM CHLORIDE 0.9 % IV SOLN
10.0000 mg | Freq: Once | INTRAVENOUS | Status: AC
  Administered 2019-03-25: 10 mg via INTRAVENOUS
  Filled 2019-03-25: qty 10

## 2019-03-25 NOTE — Progress Notes (Signed)
Treatment given per orders. Patient tolerated it well without problems. Vitals stable and discharged home from clinic ambulatory. Follow up as scheduled.  

## 2019-03-26 ENCOUNTER — Encounter (HOSPITAL_COMMUNITY): Payer: Self-pay

## 2019-03-26 ENCOUNTER — Inpatient Hospital Stay (HOSPITAL_COMMUNITY): Payer: Medicare HMO

## 2019-03-26 VITALS — BP 155/53 | HR 64 | Temp 98.1°F | Resp 16

## 2019-03-26 DIAGNOSIS — Z86000 Personal history of in-situ neoplasm of breast: Secondary | ICD-10-CM | POA: Diagnosis not present

## 2019-03-26 DIAGNOSIS — R05 Cough: Secondary | ICD-10-CM | POA: Diagnosis not present

## 2019-03-26 DIAGNOSIS — Z79899 Other long term (current) drug therapy: Secondary | ICD-10-CM | POA: Diagnosis not present

## 2019-03-26 DIAGNOSIS — Z5189 Encounter for other specified aftercare: Secondary | ICD-10-CM | POA: Diagnosis not present

## 2019-03-26 DIAGNOSIS — Z87891 Personal history of nicotine dependence: Secondary | ICD-10-CM | POA: Diagnosis not present

## 2019-03-26 DIAGNOSIS — R5383 Other fatigue: Secondary | ICD-10-CM | POA: Diagnosis not present

## 2019-03-26 DIAGNOSIS — Z800 Family history of malignant neoplasm of digestive organs: Secondary | ICD-10-CM | POA: Diagnosis not present

## 2019-03-26 DIAGNOSIS — R200 Anesthesia of skin: Secondary | ICD-10-CM | POA: Diagnosis not present

## 2019-03-26 DIAGNOSIS — C3412 Malignant neoplasm of upper lobe, left bronchus or lung: Secondary | ICD-10-CM | POA: Diagnosis not present

## 2019-03-26 DIAGNOSIS — C3490 Malignant neoplasm of unspecified part of unspecified bronchus or lung: Secondary | ICD-10-CM

## 2019-03-26 MED ORDER — PEGFILGRASTIM-CBQV 6 MG/0.6ML ~~LOC~~ SOSY
6.0000 mg | PREFILLED_SYRINGE | Freq: Once | SUBCUTANEOUS | Status: AC
  Administered 2019-03-26: 14:00:00 6 mg via SUBCUTANEOUS
  Filled 2019-03-26: qty 0.6

## 2019-03-26 NOTE — Progress Notes (Signed)
Patient tolerated injection with no complaints voiced.  Site clean and dry with no bruising or swelling noted at site.  Band aid applied.  Vss with discharge and left ambulatory with no s/s of distress noted.  

## 2019-03-30 DIAGNOSIS — J441 Chronic obstructive pulmonary disease with (acute) exacerbation: Secondary | ICD-10-CM | POA: Diagnosis not present

## 2019-04-02 DIAGNOSIS — I10 Essential (primary) hypertension: Secondary | ICD-10-CM | POA: Diagnosis not present

## 2019-04-02 DIAGNOSIS — J449 Chronic obstructive pulmonary disease, unspecified: Secondary | ICD-10-CM | POA: Diagnosis not present

## 2019-04-02 DIAGNOSIS — M159 Polyosteoarthritis, unspecified: Secondary | ICD-10-CM | POA: Diagnosis not present

## 2019-04-08 DIAGNOSIS — I10 Essential (primary) hypertension: Secondary | ICD-10-CM | POA: Diagnosis not present

## 2019-04-08 DIAGNOSIS — C50919 Malignant neoplasm of unspecified site of unspecified female breast: Secondary | ICD-10-CM | POA: Diagnosis not present

## 2019-04-08 DIAGNOSIS — Z6839 Body mass index (BMI) 39.0-39.9, adult: Secondary | ICD-10-CM | POA: Diagnosis not present

## 2019-04-08 DIAGNOSIS — J449 Chronic obstructive pulmonary disease, unspecified: Secondary | ICD-10-CM | POA: Diagnosis not present

## 2019-04-08 DIAGNOSIS — F419 Anxiety disorder, unspecified: Secondary | ICD-10-CM | POA: Diagnosis not present

## 2019-04-08 DIAGNOSIS — Z299 Encounter for prophylactic measures, unspecified: Secondary | ICD-10-CM | POA: Diagnosis not present

## 2019-04-15 DIAGNOSIS — Z87891 Personal history of nicotine dependence: Secondary | ICD-10-CM | POA: Diagnosis not present

## 2019-04-15 DIAGNOSIS — Z299 Encounter for prophylactic measures, unspecified: Secondary | ICD-10-CM | POA: Diagnosis not present

## 2019-04-15 DIAGNOSIS — I10 Essential (primary) hypertension: Secondary | ICD-10-CM | POA: Diagnosis not present

## 2019-04-15 DIAGNOSIS — J449 Chronic obstructive pulmonary disease, unspecified: Secondary | ICD-10-CM | POA: Diagnosis not present

## 2019-04-15 DIAGNOSIS — Z6839 Body mass index (BMI) 39.0-39.9, adult: Secondary | ICD-10-CM | POA: Diagnosis not present

## 2019-04-15 DIAGNOSIS — F419 Anxiety disorder, unspecified: Secondary | ICD-10-CM | POA: Diagnosis not present

## 2019-04-15 DIAGNOSIS — C3490 Malignant neoplasm of unspecified part of unspecified bronchus or lung: Secondary | ICD-10-CM | POA: Diagnosis not present

## 2019-04-21 ENCOUNTER — Other Ambulatory Visit: Payer: Self-pay

## 2019-04-21 ENCOUNTER — Ambulatory Visit (HOSPITAL_COMMUNITY)
Admission: RE | Admit: 2019-04-21 | Discharge: 2019-04-21 | Disposition: A | Discharge status: Home / Self Care | Payer: Medicare HMO | Source: Ambulatory Visit | Attending: Hematology | Admitting: Hematology

## 2019-04-21 DIAGNOSIS — C3490 Malignant neoplasm of unspecified part of unspecified bronchus or lung: Secondary | ICD-10-CM | POA: Insufficient documentation

## 2019-04-21 DIAGNOSIS — J90 Pleural effusion, not elsewhere classified: Secondary | ICD-10-CM | POA: Diagnosis not present

## 2019-04-21 MED ORDER — IOHEXOL 300 MG/ML  SOLN
75.0000 mL | Freq: Once | INTRAMUSCULAR | Status: AC | PRN
  Administered 2019-04-21: 13:00:00 75 mL via INTRAVENOUS

## 2019-04-22 ENCOUNTER — Inpatient Hospital Stay (HOSPITAL_COMMUNITY): Payer: Medicare HMO | Attending: Hematology | Admitting: Hematology

## 2019-04-22 ENCOUNTER — Other Ambulatory Visit: Payer: Self-pay

## 2019-04-22 ENCOUNTER — Inpatient Hospital Stay (HOSPITAL_COMMUNITY): Payer: Medicare HMO

## 2019-04-22 ENCOUNTER — Encounter (HOSPITAL_COMMUNITY): Payer: Self-pay | Admitting: *Deleted

## 2019-04-22 ENCOUNTER — Encounter (HOSPITAL_COMMUNITY): Payer: Self-pay | Admitting: Hematology

## 2019-04-22 DIAGNOSIS — Z79899 Other long term (current) drug therapy: Secondary | ICD-10-CM | POA: Insufficient documentation

## 2019-04-22 DIAGNOSIS — C3490 Malignant neoplasm of unspecified part of unspecified bronchus or lung: Secondary | ICD-10-CM

## 2019-04-22 DIAGNOSIS — Z8249 Family history of ischemic heart disease and other diseases of the circulatory system: Secondary | ICD-10-CM | POA: Insufficient documentation

## 2019-04-22 DIAGNOSIS — C3412 Malignant neoplasm of upper lobe, left bronchus or lung: Secondary | ICD-10-CM | POA: Diagnosis not present

## 2019-04-22 DIAGNOSIS — R200 Anesthesia of skin: Secondary | ICD-10-CM | POA: Diagnosis not present

## 2019-04-22 DIAGNOSIS — R61 Generalized hyperhidrosis: Secondary | ICD-10-CM | POA: Diagnosis not present

## 2019-04-22 DIAGNOSIS — D649 Anemia, unspecified: Secondary | ICD-10-CM | POA: Insufficient documentation

## 2019-04-22 DIAGNOSIS — Z806 Family history of leukemia: Secondary | ICD-10-CM | POA: Insufficient documentation

## 2019-04-22 DIAGNOSIS — Z87891 Personal history of nicotine dependence: Secondary | ICD-10-CM | POA: Insufficient documentation

## 2019-04-22 DIAGNOSIS — Z7951 Long term (current) use of inhaled steroids: Secondary | ICD-10-CM | POA: Diagnosis not present

## 2019-04-22 DIAGNOSIS — R600 Localized edema: Secondary | ICD-10-CM | POA: Diagnosis not present

## 2019-04-22 DIAGNOSIS — R197 Diarrhea, unspecified: Secondary | ICD-10-CM | POA: Diagnosis not present

## 2019-04-22 DIAGNOSIS — M545 Low back pain: Secondary | ICD-10-CM | POA: Insufficient documentation

## 2019-04-22 DIAGNOSIS — Z800 Family history of malignant neoplasm of digestive organs: Secondary | ICD-10-CM | POA: Diagnosis not present

## 2019-04-22 DIAGNOSIS — Z803 Family history of malignant neoplasm of breast: Secondary | ICD-10-CM | POA: Diagnosis not present

## 2019-04-22 DIAGNOSIS — K219 Gastro-esophageal reflux disease without esophagitis: Secondary | ICD-10-CM | POA: Diagnosis not present

## 2019-04-22 LAB — COMPREHENSIVE METABOLIC PANEL
ALT: 19 U/L (ref 0–44)
AST: 20 U/L (ref 15–41)
Albumin: 3.8 g/dL (ref 3.5–5.0)
Alkaline Phosphatase: 67 U/L (ref 38–126)
Anion gap: 12 (ref 5–15)
BUN: 14 mg/dL (ref 8–23)
CO2: 28 mmol/L (ref 22–32)
Calcium: 9.5 mg/dL (ref 8.9–10.3)
Chloride: 102 mmol/L (ref 98–111)
Creatinine, Ser: 0.59 mg/dL (ref 0.44–1.00)
GFR calc Af Amer: >60 mL/min (ref >60)
GFR calc non Af Amer: >60 mL/min (ref >60)
Glucose, Bld: 111 mg/dL — ABNORMAL HIGH (ref 70–99)
Potassium: 4.4 mmol/L (ref 3.5–5.1)
Sodium: 142 mmol/L (ref 135–145)
Total Bilirubin: 0.7 mg/dL (ref 0.3–1.2)
Total Protein: 7.2 g/dL (ref 6.5–8.1)

## 2019-04-22 LAB — CBC WITH DIFFERENTIAL/PLATELET
Abs Immature Granulocytes: 0.01 10*3/uL (ref 0.00–0.07)
Basophils Absolute: 0 10*3/uL (ref 0.0–0.1)
Basophils Relative: 1 %
Eosinophils Absolute: 0.1 10*3/uL (ref 0.0–0.5)
Eosinophils Relative: 1 %
HCT: 34.1 % — ABNORMAL LOW (ref 36.0–46.0)
Hemoglobin: 10.3 g/dL — ABNORMAL LOW (ref 12.0–15.0)
Immature Granulocytes: 0 %
Lymphocytes Relative: 27 %
Lymphs Abs: 1.8 10*3/uL (ref 0.7–4.0)
MCH: 29.9 pg (ref 26.0–34.0)
MCHC: 30.2 g/dL (ref 30.0–36.0)
MCV: 98.8 fL (ref 80.0–100.0)
Monocytes Absolute: 0.8 10*3/uL (ref 0.1–1.0)
Monocytes Relative: 12 %
Neutro Abs: 4 10*3/uL (ref 1.7–7.7)
Neutrophils Relative %: 59 %
Platelets: 339 10*3/uL (ref 150–400)
RBC: 3.45 MIL/uL — ABNORMAL LOW (ref 3.87–5.11)
RDW: 19.9 % — ABNORMAL HIGH (ref 11.5–15.5)
WBC: 6.7 10*3/uL (ref 4.0–10.5)
nRBC: 0 % (ref 0.0–0.2)

## 2019-04-22 LAB — MAGNESIUM: Magnesium: 1.7 mg/dL (ref 1.7–2.4)

## 2019-04-22 NOTE — Patient Instructions (Addendum)
[place] at [facility] Discharge Instructions  You were seen today by Dr. Delton Coombes. He went over your recent lab, scan results. He will see you back in 4 months for labs, scan and follow up.   Thank you for choosing [place] at [facility] to provide your oncology and hematology care.  To afford each patient quality time with our provider, please arrive at least 15 minutes before your scheduled appointment time.   If you have a lab appointment with the [place] please come in thru the  Main Entrance and check in at the main information desk  You need to re-schedule your appointment should you arrive 10 or more minutes late.  We strive to give you quality time with our providers, and arriving late affects you and other patients whose appointments are after yours.  Also, if you no show three or more times for appointments you may be dismissed from the clinic at the providers discretion.     Again, thank you for choosing [facility].  Our hope is that these requests will decrease the amount of time that you wait before being seen by our physicians.       _____________________________________________________________  Should you have questions after your visit to [facility], please contact our office at [phone] between the hours of 8:00 a.m. and 4:30 p.m.  Voicemails left after 4:00 p.m. will not be returned until the following business day.  For prescription refill requests, have your pharmacy contact our office and allow 72 hours.    Cancer Center Support Programs:   > Cancer Support Group  2nd Tuesday of the month 1pm-2pm, Journey Room

## 2019-04-22 NOTE — Assessment & Plan Note (Signed)
1.  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  She was found to have an abnormal chest x-ray followed by CT scan which showed left upper lobe lung mass. - She smoked 1 to 2 packs of cigarettes per day for the last 50 years.  She quit smoking on 10/23/2018. - Denies any significant weight loss.  But has night sweats occasionally.  Denies any hemoptysis. - PET CT scan on 11/17/2018 showed left upper lobe lung mass, 4.9 cm, no evidence of adenopathy or metastasis.  MRI of the brain was negative for intracranial mets. - Left upper lobectomy and lymph node biopsy on 12/15/2018.  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   -Cancer type ID testing shows 96% probability of neuroendocrine carcinoma (small cell/large cell carcinoma). -4 cycles of adjuvant chemotherapy with carboplatin and etoposide from 01/20/2019 through 03/23/2019. -Her energy levels have continued to improve. - We reviewed results of the CT of the chest with contrast dated 04/21/2019 which shows left upper lobectomy.  A few scattered tiny pulmonary nodules measure up to 4 mm in the right upper lobe, adjacent to the major fissure, new since 10/24/2018.  Small loculated left pleural effusion is stable. - I have recommended a follow-up CT scan of the chest in 4 months because of the new findings. - She has mild normocytic anemia with hemoglobin around 10.3.  We will closely monitor it.  If her hemoglobin does not improve, will consider further testing including ferritin, iron panel, K12, copper and folic acid.   2.  Family history: -Mother had lung cancer and was never smoker.  Father had pancreatic cancer. -1 sister has colon cancer in 1 sister with leukemia.  Another sister had breast cancer. -[facility] testing on 12/09/2018 was negative for hereditary  malignancies.

## 2019-04-22 NOTE — Progress Notes (Signed)
Patient's daughter called asking questions about today's visit.    I called and spoke with Izora Gala and got her permission to talk with Abigail Butts.    I returned Wendy's call and gave her a recap of the visit today.  I reviewed the results of the CT scan.  I discussed with her the period of observation and rescan reasoning.  She was given the opportunity to ask questions and all were answered to her satisfaction.    I advised her to check my chart for updates on her mom's medical record.  She states that she already uses it.

## 2019-04-22 NOTE — Progress Notes (Signed)
[place] [address], [place] [address]   CLINIC:  Medical Oncology/Hematology  PCP:  Glenda Chroman, MD [place] [id number] [phone]   REASON FOR VISIT:  Follow-up for neuroendocrinelung cancer   BRIEF ONCOLOGIC HISTORY:    DCIS (ductal carcinoma in situ) of breast   10/28/2013 Initial Diagnosis    DCIS (ductal carcinoma in situ) of breast    12/21/2018 Genetic Testing    Negative genetic testing on the multicancer panel.  The Multi-Gene Panel offered by [facility] includes sequencing and/or deletion duplication testing of the following 84 genes: AIP, ALK, APC, ATM, AXIN2,BAP1,  BARD1, BLM, BMPR1A, BRCA1, BRCA2, BRIP1, CASR, CDC73, CDH1, CDK4, CDKN1B, CDKN1C, CDKN2A (p14ARF), CDKN2A (p16INK4a), CEBPA, CHEK2, CTNNA1, DICER1, DIS3L2, EGFR (c.2369C>T, p.Thr790Met variant only), EPCAM (Deletion/duplication testing only), FH, FLCN, GATA2, GPC3, GREM1 (Promoter region deletion/duplication testing only), HOXB13 (c.251G>A, p.Gly84Glu), HRAS, KIT, MAX, MEN1, MET, MITF (c.952G>A, p.Glu318Lys variant only), MLH1, MSH2, MSH3, MSH6, MUTYH, NBN, NF1, NF2, NTHL1, PALB2, PDGFRA, PHOX2B, PMS2, POLD1, POLE, POT1, PRKAR1A, PTCH1, PTEN, RAD50, RAD51C, RAD51D, RB1, RECQL4, RET, RUNX1, SDHAF2, SDHA (sequence changes only), SDHB, SDHC, SDHD, SMAD4, SMARCA4, SMARCB1, SMARCE1, STK11, SUFU, TERC, TERT, TMEM127, TP53, TSC1, TSC2, VHL, WRN and WT1.  The report date is 12/21/2018.     Lung cancer ([place])   12/15/2018 Initial Diagnosis    Lung cancer ([employer])    12/21/2018 Genetic Testing    Negative genetic testing on the multicancer panel.  The Multi-Gene Panel offered by [facility] includes sequencing and/or deletion duplication testing of the following 84 genes: AIP, ALK, APC, ATM, AXIN2,BAP1,  BARD1, BLM, BMPR1A, BRCA1, BRCA2, BRIP1, CASR, CDC73, CDH1, CDK4, CDKN1B, CDKN1C, CDKN2A (p14ARF), CDKN2A (p16INK4a), CEBPA, CHEK2, CTNNA1, DICER1, DIS3L2, EGFR (c.2369C>T, p.Thr790Met variant  only), EPCAM (Deletion/duplication testing only), FH, FLCN, GATA2, GPC3, GREM1 (Promoter region deletion/duplication testing only), HOXB13 (c.251G>A, p.Gly84Glu), HRAS, KIT, MAX, MEN1, MET, MITF (c.952G>A, p.Glu318Lys variant only), MLH1, MSH2, MSH3, MSH6, MUTYH, NBN, NF1, NF2, NTHL1, PALB2, PDGFRA, PHOX2B, PMS2, POLD1, POLE, POT1, PRKAR1A, PTCH1, PTEN, RAD50, RAD51C, RAD51D, RB1, RECQL4, RET, RUNX1, SDHAF2, SDHA (sequence changes only), SDHB, SDHC, SDHD, SMAD4, SMARCA4, SMARCB1, SMARCE1, STK11, SUFU, TERC, TERT, TMEM127, TP53, TSC1, TSC2, VHL, WRN and WT1.  The report date is 12/21/2018.     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  The Multi-Gene Panel offered by [facility] includes sequencing and/or deletion duplication testing of the following 84 genes: AIP, ALK, APC, ATM, AXIN2,BAP1,  BARD1, BLM, BMPR1A, BRCA1, BRCA2, BRIP1, CASR, CDC73, CDH1, CDK4, CDKN1B, CDKN1C, CDKN2A (p14ARF), CDKN2A (p16INK4a), CEBPA, CHEK2, CTNNA1, DICER1, DIS3L2, EGFR (c.2369C>T, p.Thr790Met variant only), EPCAM (Deletion/duplication testing only), FH, FLCN, GATA2, GPC3, GREM1 (Promoter region deletion/duplication testing only), HOXB13 (c.251G>A, p.Gly84Glu), HRAS, KIT, MAX, MEN1, MET, MITF (c.952G>A, p.Glu318Lys variant only), MLH1, MSH2, MSH3, MSH6, MUTYH, NBN, NF1, NF2, NTHL1, PALB2, PDGFRA, PHOX2B, PMS2, POLD1, POLE, POT1, PRKAR1A, PTCH1, PTEN, RAD50, RAD51C, RAD51D, RB1, RECQL4, RET, RUNX1, SDHAF2, SDHA (sequence changes only), SDHB, SDHC, SDHD, SMAD4, SMARCA4, SMARCB1, SMARCE1, STK11, SUFU, TERC, TERT, TMEM127, TP53, TSC1, TSC2, VHL, WRN and WT1.  The report date is 12/21/2018.     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[place])   01/12/2019 Initial Diagnosis    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[place])    01/20/2019 -  Chemotherapy    The patient had  palonosetron (ALOXI) injection 0.25 mg,  0.25 mg, Intravenous,  Once, 4 of 4 cycles Administration: 0.25 mg (01/20/2019), 0.25 mg (02/10/2019), 0.25 mg (03/02/2019), 0.25 mg (03/23/2019) pegfilgrastim-cbqv (UDENYCA) injection 6 mg, 6 mg, Subcutaneous, Once, 4 of 4 cycles Administration: 6 mg (01/23/2019), 6 mg (02/13/2019), 6 mg (03/05/2019), 6 mg (03/26/2019) CARBOplatin (PARAPLATIN) 500 mg in sodium chloride 0.9 % 250 mL chemo infusion, 500 mg (100 % of original dose 504.5 mg), Intravenous,  Once, 4 of 4 cycles Dose modification:   (original dose 504.5 mg, Cycle 1),   (original dose 504.5 mg, Cycle 2),   (original dose 504.5 mg, Cycle 3),   (original dose 504.5 mg, Cycle 4) Administration: 500 mg (01/20/2019), 500 mg (02/10/2019), 500 mg (03/02/2019), 500 mg (03/23/2019) etoposide (VEPESID) 200 mg in sodium chloride 0.9 % 500 mL chemo infusion, 100 mg/m2 = 200 mg, Intravenous,  Once, 4 of 4 cycles Administration: 200 mg (01/20/2019), 200 mg (01/21/2019), 200 mg (01/22/2019), 200 mg (02/10/2019), 200 mg (02/11/2019), 200 mg (02/12/2019), 200 mg (03/02/2019), 200 mg (03/03/2019), 200 mg (03/04/2019), 200 mg (03/23/2019), 200 mg (03/24/2019), 200 mg (03/25/2019)  for chemotherapy treatment.       CANCER STAGING: Cancer Staging DCIS (ductal carcinoma in situ) of breast Staging form: Breast, AJCC 7th Edition - Clinical stage from 12/01/2013: Stage 0 (Tis (DCIS), N0, cM0) - Signed by Heath Lark, MD on 12/01/2013 - Pathologic: Stage 0 (Tis (DCIS), N0, cM0) - Signed by Heath Lark, MD on 12/01/2013    INTERVAL HISTORY:  Ms. Pagliaro 73 y.o. female returns for follow-up of her scans.  Denies any nausea, vomiting.  She had occasional diarrhea.  Numbness in the hands has been stable.  Pain in the lower back is also stable.  Leg swelling is stable.  Denies any recent pneumonias, cough or hemoptysis.  No recent ER visits or hospitalizations.  Appetite is 25%.  Energy levels are 50%.   REVIEW OF SYSTEMS:  Review of Systems  Constitutional: Negative for fatigue.  Respiratory:  Negative for cough.   Cardiovascular: Positive for leg swelling.  Gastrointestinal: Positive for diarrhea.  Neurological: Positive for numbness.  All other systems reviewed and are negative.    PAST MEDICAL/SURGICAL HISTORY:  Past Medical History:  Diagnosis Date   Anxiety    Arthritis    Asthma    Breast cancer ([place])    breast, right DCIS   COPD (chronic obstructive pulmonary disease) (HCC)    COPD exacerbation ([place]) 01/03/2014   With hypoxia   Depression    Emphysema of lung ([place])    Family history of breast cancer    Family history of colon cancer    Family history of kidney cancer    Family history of lung cancer    Family history of pancreatic cancer    GERD (gastroesophageal reflux disease)    History of kidney stones    Hyperlipidemia    Hypertension    Obesity 01/05/2014   Osteoporosis    Osteoporosis, unspecified 12/01/2013   Pneumonia    x2   Tobacco abuse 12/01/2013   Past Surgical History:  Procedure Laterality Date   ABDOMINAL HYSTERECTOMY     BLADDER SURGERY     had tacked   BREAST LUMPECTOMY WITH NEEDLE LOCALIZATION AND AXILLARY SENTINEL LYMPH NODE BX Right 11/09/2013   Procedure: BREAST LUMPECTOMY WITH NEEDLE LOCALIZATION AND AXILLARY SENTINEL LYMPH NODE BIOPSY;  Surgeon: Harl Bowie, MD;  Location: [facility];  Service: General;  Laterality: Right;   BREAST SURGERY Right  lumpectomy   CHOLECYSTECTOMY     NODE DISSECTION Left 12/15/2018   Procedure: NODE DISSECTION LEFT LUNG;  Surgeon: Melrose Nakayama, MD;  Location: [facility];  Service: Thoracic;  Laterality: Left;   PORTACATH PLACEMENT Right 01/19/2019   Procedure: INSERTION PORT-A-CATH;  Surgeon: Aviva Signs, MD;  Location: [facility];  Service: General;  Laterality: Right;   VIDEO ASSISTED THORACOSCOPY (VATS)/ LOBECTOMY Left 12/15/2018   Procedure: VIDEO ASSISTED THORACOSCOPY (VATS)/LEFT UPPER LOBECTOMY;  Surgeon: Melrose Nakayama, MD;  Location: [facility];  Service:  Thoracic;  Laterality: Left;     SOCIAL HISTORY:  Social History   Socioeconomic History   Marital status: Married    Spouse name: Not on file   Number of children: 3   Years of education: Not on file   Highest education level: Not on file  Occupational History   Occupation: Database administrator    Comment: [employer]    Comment: [place]    CommentResearch scientist (life sciences) strain: Not hard at [employer] insecurity:    Worry: Never true    Inability: Never true   Transportation needs:    Medical: No    Non-medical: No  Tobacco Use   Smoking status: Former Smoker    Packs/day: 0.50    Years: 43.00    Pack years: 21.50    Types: Cigarettes    Last attempt to quit: 10/24/2018    Years since quitting: 0.4   Smokeless tobacco: Never Used  Substance and Sexual Activity   Alcohol use: No   Drug use: No   Sexual activity: Not Currently  Lifestyle   Physical activity:    Days per week: 0 days    Minutes per session: 0 min   Stress: Rather much  Relationships   Social connections:    Talks on phone: More than three times a week    Gets together: More than three times a week    Attends religious service: More than 4 times per year    Active member of club or organization: Yes    Attends meetings of clubs or organizations: More than 4 times per year    Relationship status: Married   Intimate partner violence:    Fear of current or ex partner: No    Emotionally abused: No    Physically abused: No    Forced sexual activity: No  Other Topics Concern   Not on file  Social History Narrative   Not on file    FAMILY HISTORY:  Family History  Problem Relation Age of Onset   Lung cancer Mother 10       non smoker   Pancreatic cancer Father 41       d. 60   Leukemia Sister        dx in her 29s   Heart disease Sister    Colon cancer Sister        dx and died in her 1s   Heart disease Sister    Lung cancer Maternal  Aunt        non-smoker   Heart disease Sister    Breast cancer Sister        dx early 25s   Heart disease Brother    Cirrhosis Brother        d. 59   Aneurysm Brother        d. 13s   Fibromyalgia Daughter    Kidney cancer Nephew 71  Kidney cancer Nephew        dx in 41s; great nephew   Leukemia Nephew    Liver disease Nephew    Other Niece        white matter on brain; dx late 77s    CURRENT MEDICATIONS:  Outpatient Encounter Medications as of 04/22/2019  Medication Sig   albuterol (PROVENTIL HFA;VENTOLIN HFA) 108 (90 BASE) MCG/ACT inhaler Inhale 2 puffs into the lungs 3 (three) times daily. Also use the inhaler every 2 hours if needed for worsening shortness of breath and wheezing. (Patient taking differently: Inhale 2 puffs into the lungs 3 (three) times daily as needed for wheezing or shortness of breath. )   Calcium 600-200 MG-UNIT per tablet Take 1 tablet by mouth daily.   clonazePAM (KLONOPIN) 0.5 MG tablet Take 0.5 mg by mouth at bedtime.    FLUoxetine (PROZAC) 40 MG capsule Take 40 mg by mouth daily.   fluticasone (FLONASE) 50 MCG/ACT nasal spray Place 2 sprays into both nostrils daily.   fluticasone furoate-vilanterol (BREO ELLIPTA) 100-25 MCG/INH AEPB Inhale 1 puff into the lungs daily.   hydrOXYzine (ATARAX/VISTARIL) 25 MG tablet    lidocaine-prilocaine (EMLA) cream Apply 1 application topically as needed. Apply pea sized amount to port site 1 hour prior to appointment and cover with plastic wrap   lisinopril (PRINIVIL,ZESTRIL) 10 MG tablet Take 10 mg by mouth daily.   metoprolol (LOPRESSOR) 50 MG tablet Take 50-75 mg by mouth 2 (two) times daily. Takes 75 mg in the morning and 50 mg in the evening   nabumetone (RELAFEN) 500 MG tablet Take 500 mg by mouth every morning.   omeprazole (PRILOSEC) 40 MG capsule Take 40 mg by mouth daily.    oxyCODONE (OXY IR/ROXICODONE) 5 MG immediate release tablet Take 1 tablet (5 mg total) by mouth every 6 (six)  hours as needed for severe pain.   traZODone (DESYREL) 50 MG tablet Take 50 mg by mouth at bedtime.   triamcinolone cream (KENALOG) 0.1 %    [DISCONTINUED] CARBOPLATIN IV Inject into the vein every 21 ( twenty-one) days.   [DISCONTINUED] cephALEXin (KEFLEX) 500 MG capsule    [DISCONTINUED] ETOPOSIDE IV Inject into the vein every 21 ( twenty-one) days.   prochlorperazine (COMPAZINE) 10 MG tablet Take 1 tablet (10 mg total) by mouth every 6 (six) hours as needed for nausea or vomiting. (Patient not taking: Reported on 04/22/2019)   Facility-Administered Encounter Medications as of 04/22/2019  Medication   sodium chloride flush (NS) 0.9 % injection 10 mL    ALLERGIES:  No Known Allergies   PHYSICAL EXAM:  ECOG Performance status: 1  Blood pressure is 160/51.  Pulse rate is 68.  Respirate is 18.  Temperature 98.1.  Saturations 96%. Physical Exam Vitals signs reviewed.  Constitutional:      Appearance: Normal appearance.  Cardiovascular:     Rate and Rhythm: Normal rate and regular rhythm.     Heart sounds: Normal heart sounds.  Pulmonary:     Effort: Pulmonary effort is normal.     Breath sounds: Normal breath sounds.  Abdominal:     General: There is no distension.     Palpations: Abdomen is soft. There is no mass.  Musculoskeletal:        General: No swelling.  Skin:    General: Skin is warm.  Neurological:     General: No focal deficit present.     Mental Status: She is alert and oriented to person, place, and time.  Psychiatric:        Mood and Affect: Mood normal.        Behavior: Behavior normal.      LABORATORY DATA:  I have reviewed the labs as listed.  CBC    Component Value Date/Time   WBC 6.7 04/22/2019 1057   RBC 3.45 (L) 04/22/2019 1057   HGB 10.3 (L) 04/22/2019 1057   HCT 34.1 (L) 04/22/2019 1057   PLT 339 04/22/2019 1057   MCV 98.8 04/22/2019 1057   MCH 29.9 04/22/2019 1057   MCHC 30.2 04/22/2019 1057   RDW 19.9 (H) 04/22/2019 1057    LYMPHSABS 1.8 04/22/2019 1057   MONOABS 0.8 04/22/2019 1057   EOSABS 0.1 04/22/2019 1057   BASOSABS 0.0 04/22/2019 1057   CMP Latest Ref Rng & Units 04/22/2019 03/23/2019 03/02/2019  Glucose 70 - 99 mg/dL 111(H) 107(H) 146(H)  BUN 8 - 23 mg/dL '14 12 9  '$ Creatinine 0.44 - 1.00 mg/dL 0.59 0.54 0.53  Sodium 135 - 145 mmol/L 142 140 140  Potassium 3.5 - 5.1 mmol/L 4.4 3.9 3.6  Chloride 98 - 111 mmol/L 102 104 105  CO2 22 - 32 mmol/L '28 27 25  '$ Calcium 8.9 - 10.3 mg/dL 9.5 8.9 9.0  Total Protein 6.5 - 8.1 g/dL 7.2 6.8 6.9  Total Bilirubin 0.3 - 1.2 mg/dL 0.7 0.3 0.4  Alkaline Phos 38 - 126 U/L 67 68 78  AST 15 - 41 U/L '20 21 25  '$ ALT 0 - 44 U/L '19 20 21       '$ DIAGNOSTIC IMAGING:  I have independently reviewed the scans and discussed with the patient.   I have reviewed Venita Lick LPN's note and agree with the documentation.  I personally performed a face-to-face visit, made revisions and my assessment and plan is as follows.    ASSESSMENT & PLAN:   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HCC) 1.  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  She was found to have an abnormal chest x-ray followed by CT scan which showed left upper lobe lung mass. - She smoked 1 to 2 packs of cigarettes per day for the last 50 years.  She quit smoking on 10/23/2018. - Denies any significant weight loss.  But has night sweats occasionally.  Denies any hemoptysis. - PET CT scan on 11/17/2018 showed left upper lobe lung mass, 4.9 cm, no evidence of adenopathy or metastasis.  MRI of the brain was negative for intracranial mets. - Left upper lobectomy and lymph node biopsy on 12/15/2018.  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   -Cancer type ID testing shows 96% probability of neuroendocrine carcinoma (small cell/large cell carcinoma). -4  cycles of adjuvant chemotherapy with carboplatin and etoposide from 01/20/2019 through 03/23/2019. -Her energy levels have continued to improve. - We reviewed results of the CT of the chest with contrast dated 04/21/2019 which shows left upper lobectomy.  A few scattered tiny pulmonary nodules measure up to 4 mm in the right upper lobe, adjacent to the major fissure, new since 10/24/2018.  Small loculated left pleural effusion is stable. - I have recommended a follow-up CT scan of the chest in 4 months because of the new findings. - She has mild normocytic anemia with hemoglobin around 10.3.  We will closely monitor it.  If her hemoglobin does not  improve, will consider further testing including ferritin, iron panel, Z70, copper and folic acid.   2.  Family history: -Mother had lung cancer and was never smoker.  Father had pancreatic cancer. -1 sister has colon cancer in 1 sister with leukemia.  Another sister had breast cancer. -[facility] testing on 12/09/2018 was negative for hereditary malignancies.   Orders placed this encounter:  Orders Placed This Encounter  Procedures   [place], MD [place] [phone]

## 2019-04-30 DIAGNOSIS — I10 Essential (primary) hypertension: Secondary | ICD-10-CM | POA: Diagnosis not present

## 2019-04-30 DIAGNOSIS — M159 Polyosteoarthritis, unspecified: Secondary | ICD-10-CM | POA: Diagnosis not present

## 2019-04-30 DIAGNOSIS — J441 Chronic obstructive pulmonary disease with (acute) exacerbation: Secondary | ICD-10-CM | POA: Diagnosis not present

## 2019-04-30 DIAGNOSIS — J449 Chronic obstructive pulmonary disease, unspecified: Secondary | ICD-10-CM | POA: Diagnosis not present

## 2019-05-03 IMAGING — CR DG CHEST 2V
2 series · 2 of 2 positions shown · non-contrast
Comparison: Chest x-ray 12/19/2018.

CLINICAL DATA: 72-year-old female with history of the ATS on
12/15/2018. Chest pain. Shortness of breath.

EXAM:
CHEST - 2 VIEW

[w chest pa]
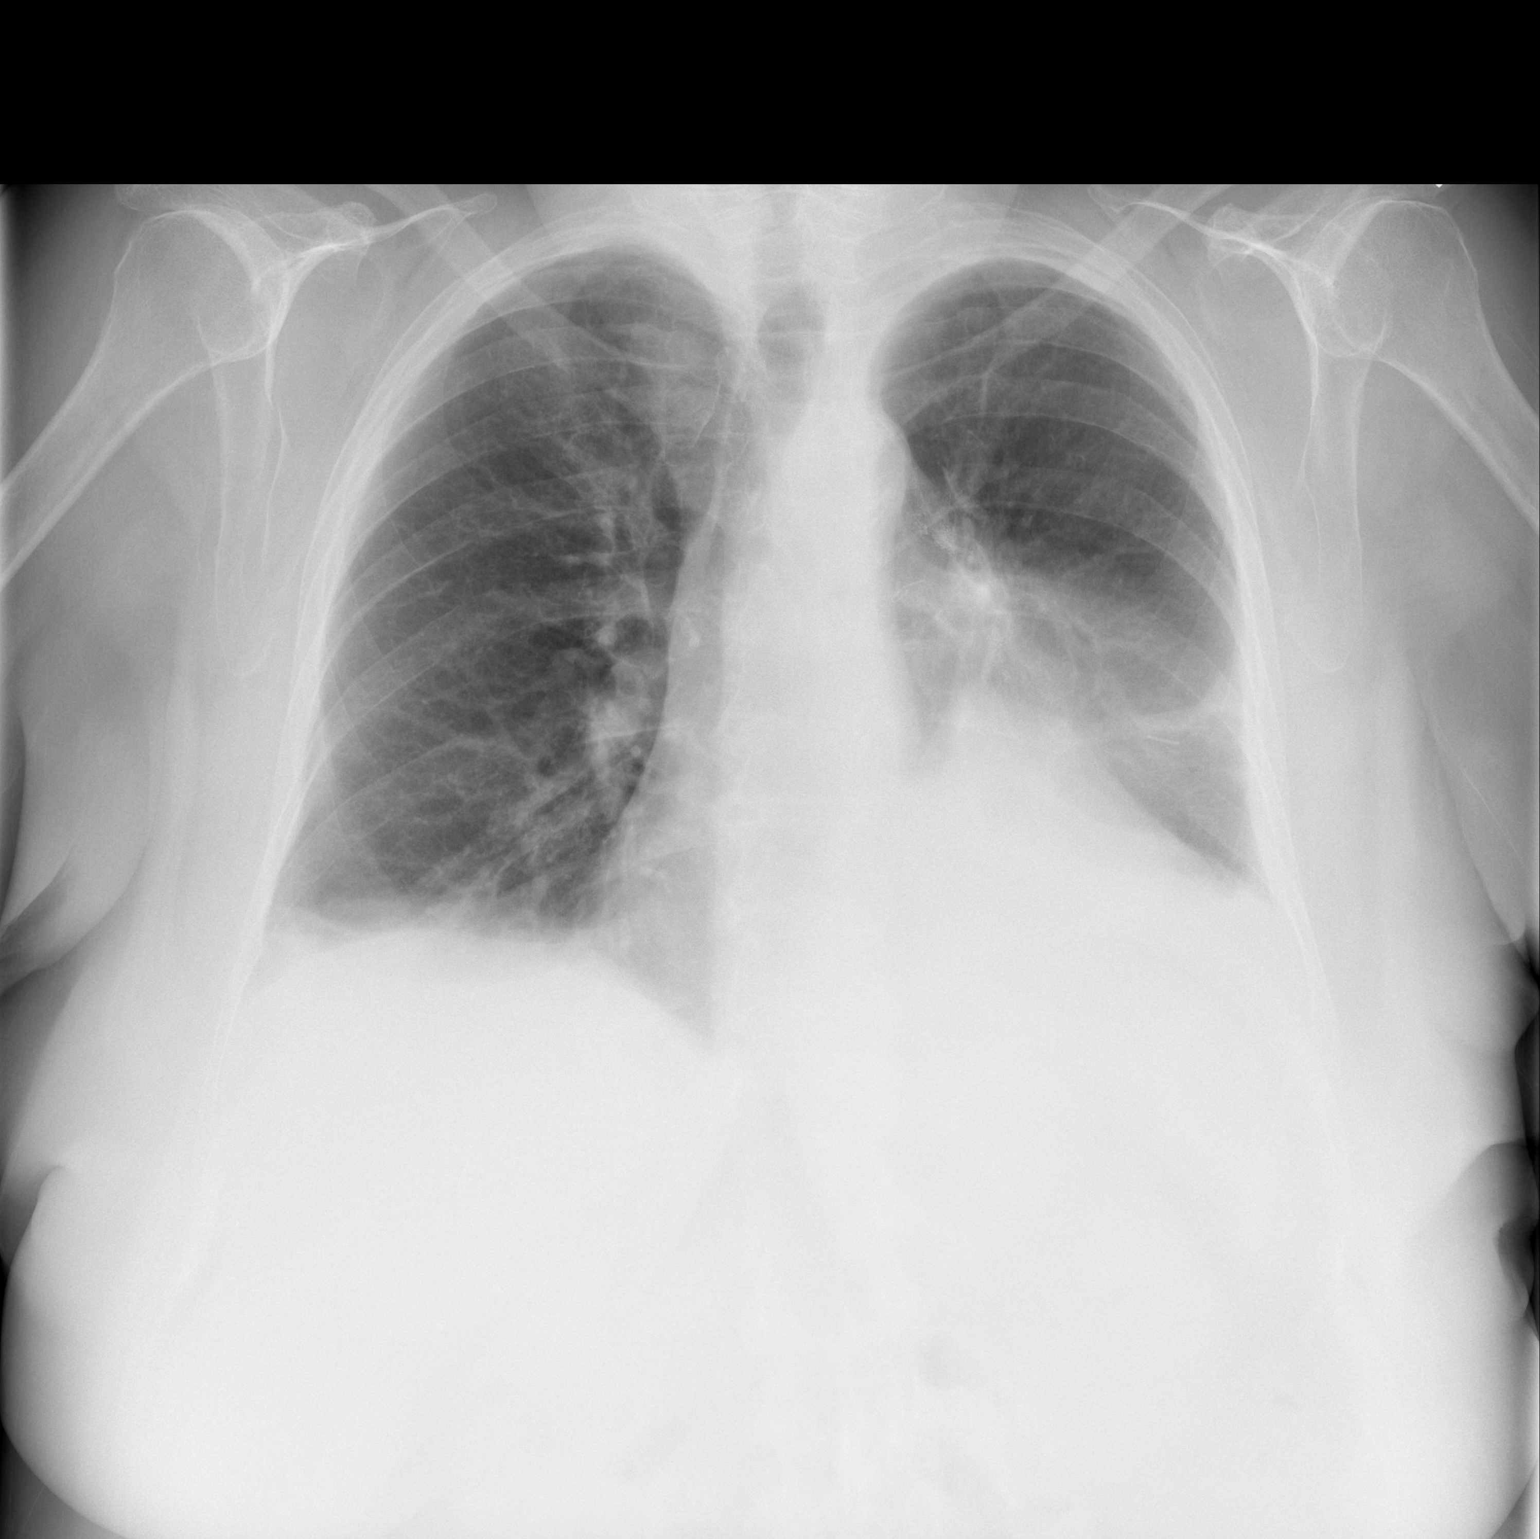

[w chest lat]
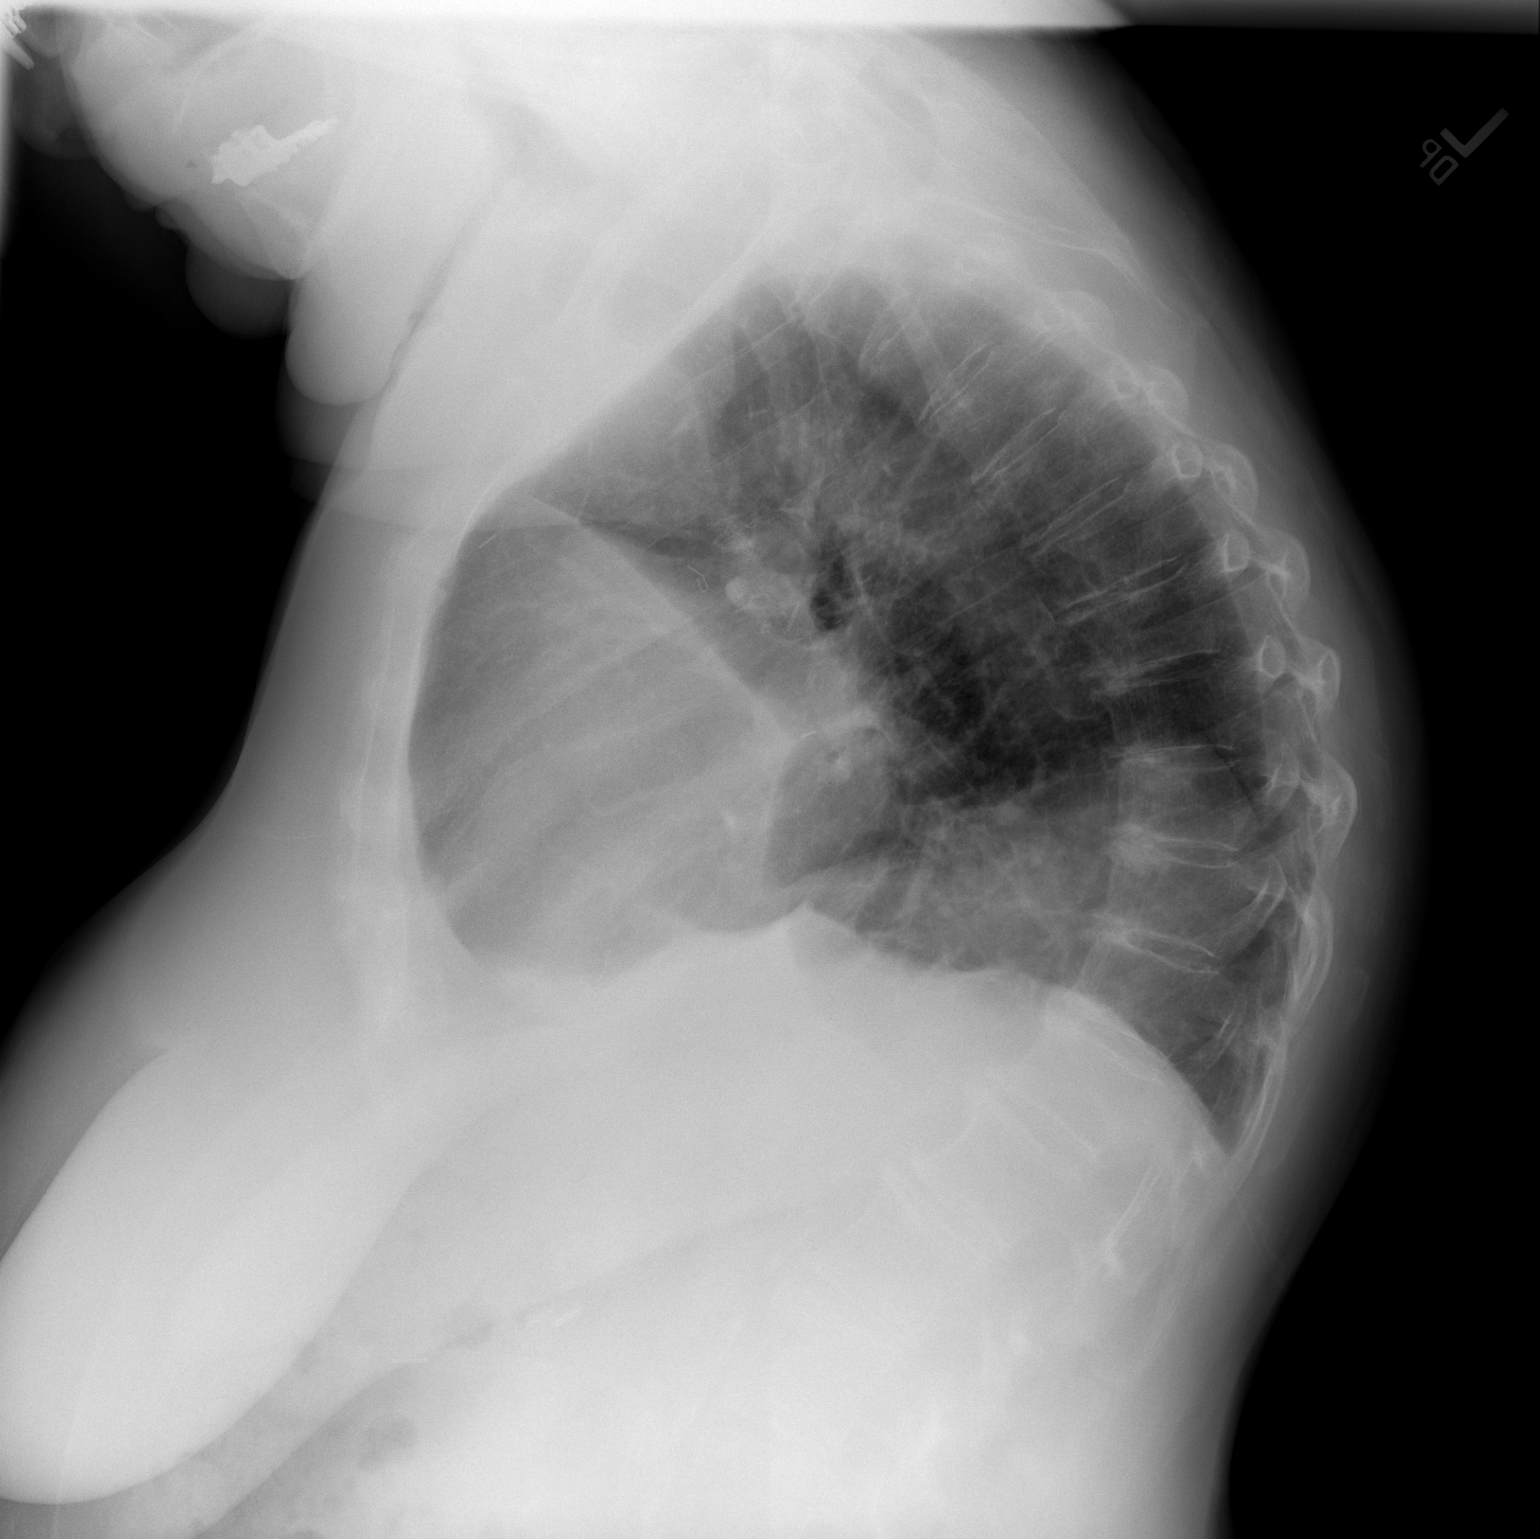

[2 of 2 positions shown; findings below may reference images not displayed]

FINDINGS: Bibasilar opacities (left greater than right), favored to reflect
predominantly subsegmental atelectasis. Small left pleural effusion.
No right pleural effusion. No evidence of pulmonary edema. No
pneumothorax. Heart size is mildly enlarged. Upper mediastinal
contours are within normal limits. Aortic atherosclerosis.
IMPRESSION: 1. Small left pleural effusion with areas of atelectasis throughout
the lung bases bilaterally (left greater than right).
2. Aortic atherosclerosis.

## 2019-05-19 DIAGNOSIS — M545 Low back pain: Secondary | ICD-10-CM | POA: Diagnosis not present

## 2019-05-19 DIAGNOSIS — J449 Chronic obstructive pulmonary disease, unspecified: Secondary | ICD-10-CM | POA: Diagnosis not present

## 2019-05-19 DIAGNOSIS — Z299 Encounter for prophylactic measures, unspecified: Secondary | ICD-10-CM | POA: Diagnosis not present

## 2019-05-19 DIAGNOSIS — I10 Essential (primary) hypertension: Secondary | ICD-10-CM | POA: Diagnosis not present

## 2019-05-19 DIAGNOSIS — R609 Edema, unspecified: Secondary | ICD-10-CM | POA: Diagnosis not present

## 2019-05-30 DIAGNOSIS — J441 Chronic obstructive pulmonary disease with (acute) exacerbation: Secondary | ICD-10-CM | POA: Diagnosis not present

## 2019-06-04 DIAGNOSIS — J449 Chronic obstructive pulmonary disease, unspecified: Secondary | ICD-10-CM | POA: Diagnosis not present

## 2019-06-04 DIAGNOSIS — M159 Polyosteoarthritis, unspecified: Secondary | ICD-10-CM | POA: Diagnosis not present

## 2019-06-04 DIAGNOSIS — I10 Essential (primary) hypertension: Secondary | ICD-10-CM | POA: Diagnosis not present

## 2019-06-15 DIAGNOSIS — Z6839 Body mass index (BMI) 39.0-39.9, adult: Secondary | ICD-10-CM | POA: Diagnosis not present

## 2019-06-15 DIAGNOSIS — Z299 Encounter for prophylactic measures, unspecified: Secondary | ICD-10-CM | POA: Diagnosis not present

## 2019-06-15 DIAGNOSIS — J449 Chronic obstructive pulmonary disease, unspecified: Secondary | ICD-10-CM | POA: Diagnosis not present

## 2019-06-15 DIAGNOSIS — C3490 Malignant neoplasm of unspecified part of unspecified bronchus or lung: Secondary | ICD-10-CM | POA: Diagnosis not present

## 2019-06-15 DIAGNOSIS — C50919 Malignant neoplasm of unspecified site of unspecified female breast: Secondary | ICD-10-CM | POA: Diagnosis not present

## 2019-06-15 DIAGNOSIS — F419 Anxiety disorder, unspecified: Secondary | ICD-10-CM | POA: Diagnosis not present

## 2019-06-30 DIAGNOSIS — J441 Chronic obstructive pulmonary disease with (acute) exacerbation: Secondary | ICD-10-CM | POA: Diagnosis not present

## 2019-07-31 DIAGNOSIS — J441 Chronic obstructive pulmonary disease with (acute) exacerbation: Secondary | ICD-10-CM | POA: Diagnosis not present

## 2019-08-21 DIAGNOSIS — M810 Age-related osteoporosis without current pathological fracture: Secondary | ICD-10-CM | POA: Diagnosis not present

## 2019-08-21 DIAGNOSIS — I10 Essential (primary) hypertension: Secondary | ICD-10-CM | POA: Diagnosis not present

## 2019-08-21 DIAGNOSIS — Z7189 Other specified counseling: Secondary | ICD-10-CM | POA: Diagnosis not present

## 2019-08-21 DIAGNOSIS — Z299 Encounter for prophylactic measures, unspecified: Secondary | ICD-10-CM | POA: Diagnosis not present

## 2019-08-21 DIAGNOSIS — Z1339 Encounter for screening examination for other mental health and behavioral disorders: Secondary | ICD-10-CM | POA: Diagnosis not present

## 2019-08-21 DIAGNOSIS — Z6838 Body mass index (BMI) 38.0-38.9, adult: Secondary | ICD-10-CM | POA: Diagnosis not present

## 2019-08-21 DIAGNOSIS — R5383 Other fatigue: Secondary | ICD-10-CM | POA: Diagnosis not present

## 2019-08-21 DIAGNOSIS — Z0000 Encounter for general adult medical examination without abnormal findings: Secondary | ICD-10-CM | POA: Diagnosis not present

## 2019-08-21 DIAGNOSIS — E7800 Pure hypercholesterolemia, unspecified: Secondary | ICD-10-CM | POA: Diagnosis not present

## 2019-08-21 DIAGNOSIS — Z1331 Encounter for screening for depression: Secondary | ICD-10-CM | POA: Diagnosis not present

## 2019-08-21 DIAGNOSIS — Z79899 Other long term (current) drug therapy: Secondary | ICD-10-CM | POA: Diagnosis not present

## 2019-08-21 DIAGNOSIS — Z1211 Encounter for screening for malignant neoplasm of colon: Secondary | ICD-10-CM | POA: Diagnosis not present

## 2019-08-25 ENCOUNTER — Other Ambulatory Visit: Payer: Self-pay

## 2019-08-25 ENCOUNTER — Inpatient Hospital Stay (HOSPITAL_COMMUNITY): Payer: Medicare HMO | Attending: Hematology

## 2019-08-25 ENCOUNTER — Ambulatory Visit (HOSPITAL_COMMUNITY)
Admission: RE | Admit: 2019-08-25 | Discharge: 2019-08-25 | Disposition: A | Discharge status: Home / Self Care | Payer: Medicare HMO | Source: Ambulatory Visit | Attending: Hematology | Admitting: Hematology

## 2019-08-25 DIAGNOSIS — C3412 Malignant neoplasm of upper lobe, left bronchus or lung: Secondary | ICD-10-CM | POA: Diagnosis not present

## 2019-08-25 DIAGNOSIS — C3490 Malignant neoplasm of unspecified part of unspecified bronchus or lung: Secondary | ICD-10-CM | POA: Insufficient documentation

## 2019-08-25 LAB — COMPREHENSIVE METABOLIC PANEL
ALT: 18 U/L (ref 0–44)
AST: 21 U/L (ref 15–41)
Albumin: 3.7 g/dL (ref 3.5–5.0)
Alkaline Phosphatase: 77 U/L (ref 38–126)
Anion gap: 10 (ref 5–15)
BUN: 15 mg/dL (ref 8–23)
CO2: 28 mmol/L (ref 22–32)
Calcium: 9.2 mg/dL (ref 8.9–10.3)
Chloride: 101 mmol/L (ref 98–111)
Creatinine, Ser: 0.62 mg/dL (ref 0.44–1.00)
GFR calc Af Amer: >60 mL/min (ref >60)
GFR calc non Af Amer: >60 mL/min (ref >60)
Glucose, Bld: 121 mg/dL — ABNORMAL HIGH (ref 70–99)
Potassium: 4.3 mmol/L (ref 3.5–5.1)
Sodium: 139 mmol/L (ref 135–145)
Total Bilirubin: 1 mg/dL (ref 0.3–1.2)
Total Protein: 7.6 g/dL (ref 6.5–8.1)

## 2019-08-25 LAB — CBC WITH DIFFERENTIAL/PLATELET
Abs Immature Granulocytes: 0.02 10*3/uL (ref 0.00–0.07)
Basophils Absolute: 0.1 10*3/uL (ref 0.0–0.1)
Basophils Relative: 1 %
Eosinophils Absolute: 0.2 10*3/uL (ref 0.0–0.5)
Eosinophils Relative: 2 %
HCT: 39.5 % (ref 36.0–46.0)
Hemoglobin: 11.7 g/dL — ABNORMAL LOW (ref 12.0–15.0)
Immature Granulocytes: 0 %
Lymphocytes Relative: 29 %
Lymphs Abs: 2 10*3/uL (ref 0.7–4.0)
MCH: 27.3 pg (ref 26.0–34.0)
MCHC: 29.6 g/dL — ABNORMAL LOW (ref 30.0–36.0)
MCV: 92.3 fL (ref 80.0–100.0)
Monocytes Absolute: 0.7 10*3/uL (ref 0.1–1.0)
Monocytes Relative: 10 %
Neutro Abs: 4.1 10*3/uL (ref 1.7–7.7)
Neutrophils Relative %: 58 %
Platelets: 276 10*3/uL (ref 150–400)
RBC: 4.28 MIL/uL (ref 3.87–5.11)
RDW: 15.8 % — ABNORMAL HIGH (ref 11.5–15.5)
WBC: 7.1 10*3/uL (ref 4.0–10.5)
nRBC: 0 % (ref 0.0–0.2)

## 2019-08-25 LAB — MAGNESIUM: Magnesium: 1.9 mg/dL (ref 1.7–2.4)

## 2019-08-25 MED ORDER — IOHEXOL 300 MG/ML  SOLN
75.0000 mL | Freq: Once | INTRAMUSCULAR | Status: AC | PRN
  Administered 2019-08-25: 75 mL via INTRAVENOUS

## 2019-08-27 ENCOUNTER — Inpatient Hospital Stay (HOSPITAL_COMMUNITY): Payer: Medicare HMO | Attending: Hematology | Admitting: Hematology

## 2019-08-27 ENCOUNTER — Other Ambulatory Visit: Payer: Self-pay

## 2019-08-27 VITALS — BP 158/64 | HR 74 | Temp 97.1°F | Resp 18 | Wt 205.0 lb

## 2019-08-27 DIAGNOSIS — Z801 Family history of malignant neoplasm of trachea, bronchus and lung: Secondary | ICD-10-CM | POA: Insufficient documentation

## 2019-08-27 DIAGNOSIS — Z800 Family history of malignant neoplasm of digestive organs: Secondary | ICD-10-CM | POA: Diagnosis not present

## 2019-08-27 DIAGNOSIS — Z87891 Personal history of nicotine dependence: Secondary | ICD-10-CM | POA: Insufficient documentation

## 2019-08-27 DIAGNOSIS — Z79899 Other long term (current) drug therapy: Secondary | ICD-10-CM | POA: Insufficient documentation

## 2019-08-27 DIAGNOSIS — K760 Fatty (change of) liver, not elsewhere classified: Secondary | ICD-10-CM | POA: Insufficient documentation

## 2019-08-27 DIAGNOSIS — C3490 Malignant neoplasm of unspecified part of unspecified bronchus or lung: Secondary | ICD-10-CM

## 2019-08-27 DIAGNOSIS — Z803 Family history of malignant neoplasm of breast: Secondary | ICD-10-CM | POA: Diagnosis not present

## 2019-08-27 DIAGNOSIS — Z806 Family history of leukemia: Secondary | ICD-10-CM | POA: Diagnosis not present

## 2019-08-27 DIAGNOSIS — Z853 Personal history of malignant neoplasm of breast: Secondary | ICD-10-CM | POA: Insufficient documentation

## 2019-08-27 DIAGNOSIS — Z8051 Family history of malignant neoplasm of kidney: Secondary | ICD-10-CM | POA: Insufficient documentation

## 2019-08-27 DIAGNOSIS — Z8249 Family history of ischemic heart disease and other diseases of the circulatory system: Secondary | ICD-10-CM | POA: Diagnosis not present

## 2019-08-27 DIAGNOSIS — Z8379 Family history of other diseases of the digestive system: Secondary | ICD-10-CM | POA: Insufficient documentation

## 2019-08-27 DIAGNOSIS — C3412 Malignant neoplasm of upper lobe, left bronchus or lung: Secondary | ICD-10-CM | POA: Insufficient documentation

## 2019-08-27 NOTE — Progress Notes (Signed)
[address], [place] [address]   CLINIC:  Medical Oncology/Hematology  PCP:  Glenda Chroman, MD [address] [place] [address] [phone]   REASON FOR VISIT:  Follow-up for neuroendocrinelung cancer   BRIEF ONCOLOGIC HISTORY:  Oncology History  DCIS (ductal carcinoma in situ) of breast  10/28/2013 Initial Diagnosis   DCIS (ductal carcinoma in situ) of breast   12/21/2018 Genetic Testing   Negative genetic testing on the multicancer panel.  The Multi-Gene Panel offered by [facility] includes sequencing and/or deletion duplication testing of the following 84 genes: AIP, ALK, APC, ATM, AXIN2,BAP1,  BARD1, BLM, BMPR1A, BRCA1, BRCA2, BRIP1, CASR, CDC73, CDH1, CDK4, CDKN1B, CDKN1C, CDKN2A (p14ARF), CDKN2A (p16INK4a), CEBPA, CHEK2, CTNNA1, DICER1, DIS3L2, EGFR (c.2369C>T, p.Thr790Met variant only), EPCAM (Deletion/duplication testing only), FH, FLCN, GATA2, GPC3, GREM1 (Promoter region deletion/duplication testing only), HOXB13 (c.251G>A, p.Gly84Glu), HRAS, KIT, MAX, MEN1, MET, MITF (c.952G>A, p.Glu318Lys variant only), MLH1, MSH2, MSH3, MSH6, MUTYH, NBN, NF1, NF2, NTHL1, PALB2, PDGFRA, PHOX2B, PMS2, POLD1, POLE, POT1, PRKAR1A, PTCH1, PTEN, RAD50, RAD51C, RAD51D, RB1, RECQL4, RET, RUNX1, SDHAF2, SDHA (sequence changes only), SDHB, SDHC, SDHD, SMAD4, SMARCA4, SMARCB1, SMARCE1, STK11, SUFU, TERC, TERT, TMEM127, TP53, TSC1, TSC2, VHL, WRN and WT1.  The report date is 12/21/2018.   Lung cancer ([place])  12/15/2018 Initial Diagnosis   Lung cancer ([place])   12/21/2018 Genetic Testing   Negative genetic testing on the multicancer panel.  The Multi-Gene Panel offered by [facility] includes sequencing and/or deletion duplication testing of the following 84 genes: AIP, ALK, APC, ATM, AXIN2,BAP1,  BARD1, BLM, BMPR1A, BRCA1, BRCA2, BRIP1, CASR, CDC73, CDH1, CDK4, CDKN1B, CDKN1C, CDKN2A (p14ARF), CDKN2A (p16INK4a), CEBPA, CHEK2, CTNNA1, DICER1, DIS3L2, EGFR (c.2369C>T, p.Thr790Met  variant only), EPCAM (Deletion/duplication testing only), FH, FLCN, GATA2, GPC3, GREM1 (Promoter region deletion/duplication testing only), HOXB13 (c.251G>A, p.Gly84Glu), HRAS, KIT, MAX, MEN1, MET, MITF (c.952G>A, p.Glu318Lys variant only), MLH1, MSH2, MSH3, MSH6, MUTYH, NBN, NF1, NF2, NTHL1, PALB2, PDGFRA, PHOX2B, PMS2, POLD1, POLE, POT1, PRKAR1A, PTCH1, PTEN, RAD50, RAD51C, RAD51D, RB1, RECQL4, RET, RUNX1, SDHAF2, SDHA (sequence changes only), SDHB, SDHC, SDHD, SMAD4, SMARCA4, SMARCB1, SMARCE1, STK11, SUFU, TERC, TERT, TMEM127, TP53, TSC1, TSC2, VHL, WRN and WT1.  The report date is 12/21/2018.   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  The Multi-Gene Panel offered by [facility] includes sequencing and/or deletion duplication testing of the following 84 genes: AIP, ALK, APC, ATM, AXIN2,BAP1,  BARD1, BLM, BMPR1A, BRCA1, BRCA2, BRIP1, CASR, CDC73, CDH1, CDK4, CDKN1B, CDKN1C, CDKN2A (p14ARF), CDKN2A (p16INK4a), CEBPA, CHEK2, CTNNA1, DICER1, DIS3L2, EGFR (c.2369C>T, p.Thr790Met variant only), EPCAM (Deletion/duplication testing only), FH, FLCN, GATA2, GPC3, GREM1 (Promoter region deletion/duplication testing only), HOXB13 (c.251G>A, p.Gly84Glu), HRAS, KIT, MAX, MEN1, MET, MITF (c.952G>A, p.Glu318Lys variant only), MLH1, MSH2, MSH3, MSH6, MUTYH, NBN, NF1, NF2, NTHL1, PALB2, PDGFRA, PHOX2B, PMS2, POLD1, POLE, POT1, PRKAR1A, PTCH1, PTEN, RAD50, RAD51C, RAD51D, RB1, RECQL4, RET, RUNX1, SDHAF2, SDHA (sequence changes only), SDHB, SDHC, SDHD, SMAD4, SMARCA4, SMARCB1, SMARCE1, STK11, SUFU, TERC, TERT, TMEM127, TP53, TSC1, TSC2, VHL, WRN and WT1.  The report date is 12/21/2018.   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[place])  01/12/2019 Initial Diagnosis   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[place])   01/20/2019 -  Chemotherapy   The patient had palonosetron (ALOXI) injection 0.25 mg, 0.25  mg, Intravenous,  Once, 4 of 4 cycles Administration: 0.25 mg (01/20/2019), 0.25 mg (  02/10/2019), 0.25 mg (03/02/2019), 0.25 mg (03/23/2019) pegfilgrastim-cbqv (UDENYCA) injection 6 mg, 6 mg, Subcutaneous, Once, 4 of 4 cycles Administration: 6 mg (01/23/2019), 6 mg (02/13/2019), 6 mg (03/05/2019), 6 mg (03/26/2019) CARBOplatin (PARAPLATIN) 500 mg in sodium chloride 0.9 % 250 mL chemo infusion, 500 mg (100 % of original dose 504.5 mg), Intravenous,  Once, 4 of 4 cycles Dose modification:   (original dose 504.5 mg, Cycle 1),   (original dose 504.5 mg, Cycle 2),   (original dose 504.5 mg, Cycle 3),   (original dose 504.5 mg, Cycle 4) Administration: 500 mg (01/20/2019), 500 mg (02/10/2019), 500 mg (03/02/2019), 500 mg (03/23/2019) etoposide (VEPESID) 200 mg in sodium chloride 0.9 % 500 mL chemo infusion, 100 mg/m2 = 200 mg, Intravenous,  Once, 4 of 4 cycles Administration: 200 mg (01/20/2019), 200 mg (01/21/2019), 200 mg (01/22/2019), 200 mg (02/10/2019), 200 mg (02/11/2019), 200 mg (02/12/2019), 200 mg (03/02/2019), 200 mg (03/03/2019), 200 mg (03/04/2019), 200 mg (03/23/2019), 200 mg (03/24/2019), 200 mg (03/25/2019)  for chemotherapy treatment.       CANCER STAGING: Cancer Staging DCIS (ductal carcinoma in situ) of breast Staging form: Breast, AJCC 7th Edition - Clinical stage from 12/01/2013: Stage 0 (Tis (DCIS), N0, cM0) - Signed by Heath Lark, MD on 12/01/2013 - Pathologic: Stage 0 (Tis (DCIS), N0, cM0) - Signed by Heath Lark, MD on 12/01/2013    INTERVAL HISTORY:  [name] 73 y.o. female seen for follow-up of lung cancer.  Denies any cough or hemoptysis.  Shortness of breath on exertion has been stable.  Appetite is 50%.  Energy levels are low.  Pain in the back is 5 out of 10 and stable.  Occasional headaches are also stable.  Denies any fevers or night sweats.  Chronic diarrhea is also stable.  Numbness in the hands has not changed.  REVIEW OF SYSTEMS:  Review of Systems  Constitutional: Negative for fatigue.    Respiratory: Negative for cough.   Cardiovascular: Positive for leg swelling.  Gastrointestinal: Positive for diarrhea.  Neurological: Positive for numbness.  All other systems reviewed and are negative.    PAST MEDICAL/SURGICAL HISTORY:  Past Medical History:  Diagnosis Date   Anxiety    Arthritis    Asthma    Breast cancer ([place])    breast, right DCIS   COPD (chronic obstructive pulmonary disease) (HCC)    COPD exacerbation ([place]) 01/03/2014   With hypoxia   Depression    Emphysema of lung ([place])    Family history of breast cancer    Family history of colon cancer    Family history of kidney cancer    Family history of lung cancer    Family history of pancreatic cancer    GERD (gastroesophageal reflux disease)    History of kidney stones    Hyperlipidemia    Hypertension    Obesity 01/05/2014   Osteoporosis    Osteoporosis, unspecified 12/01/2013   Pneumonia    x2   Tobacco abuse 12/01/2013   Past Surgical History:  Procedure Laterality Date   ABDOMINAL HYSTERECTOMY     BLADDER SURGERY     had tacked   BREAST LUMPECTOMY WITH NEEDLE LOCALIZATION AND AXILLARY SENTINEL LYMPH NODE BX Right 11/09/2013   Procedure: BREAST LUMPECTOMY WITH NEEDLE LOCALIZATION AND AXILLARY SENTINEL LYMPH NODE BIOPSY;  Surgeon: Harl Bowie, MD;  Location: [facility];  Service: General;  Laterality: Right;   BREAST SURGERY Right    lumpectomy   CHOLECYSTECTOMY     NODE DISSECTION Left  12/15/2018   Procedure: NODE DISSECTION LEFT LUNG;  Surgeon: Melrose Nakayama, MD;  Location: [facility];  Service: Thoracic;  Laterality: Left;   PORTACATH PLACEMENT Right 01/19/2019   Procedure: INSERTION PORT-A-CATH;  Surgeon: Aviva Signs, MD;  Location: [facility];  Service: General;  Laterality: Right;   VIDEO ASSISTED THORACOSCOPY (VATS)/ LOBECTOMY Left 12/15/2018   Procedure: VIDEO ASSISTED THORACOSCOPY (VATS)/LEFT UPPER LOBECTOMY;  Surgeon: Melrose Nakayama, MD;  [facility];  Service: Thoracic;  Laterality: Left;     SOCIAL HISTORY:  Social History   Socioeconomic History   Marital status: Married    Spouse name: Not on file   Number of children: 3   Years of education: Not on file   Highest education level: Not on file  Occupational History   Occupation: Database administrator    Comment: [employer]    Comment: [place]    CommentResearch scientist (life sciences) strain: Not hard at [employer] insecurity    Worry: Never true    Inability: Never true   Transportation needs    Medical: No    Non-medical: No  Tobacco Use   Smoking status: Former Smoker    Packs/day: 0.50    Years: 43.00    Pack years: 21.50    Types: Cigarettes    Quit date: 10/24/2018    Years since quitting: 0.9   Smokeless tobacco: Never Used  Substance and Sexual Activity   Alcohol use: No   Drug use: No   Sexual activity: Not Currently  Lifestyle   Physical activity    Days per week: 0 days    Minutes per session: 0 min   Stress: Rather much  Relationships   Social connections    Talks on phone: More than three times a week    Gets together: More than three times a week    Attends religious service: More than 4 times per year    Active member of club or organization: Yes    Attends meetings of clubs or organizations: More than 4 times per year    Relationship status: Married   Intimate partner violence    Fear of current or ex partner: No    Emotionally abused: No    Physically abused: No    Forced sexual activity: No  Other Topics Concern   Not on file  Social History Narrative   Not on file    FAMILY HISTORY:  Family History  Problem Relation Age of Onset   Lung cancer Mother 38       non smoker   Pancreatic cancer Father 81       d. 63   Leukemia Sister        dx in her 65s   Heart disease Sister    Colon cancer Sister        dx and died in her 80s   Heart disease Sister    Lung cancer Maternal  Aunt        non-smoker   Heart disease Sister    Breast cancer Sister        dx early 32s   Heart disease Brother    Cirrhosis Brother        d. 49   Aneurysm Brother        d. 46s   Fibromyalgia Daughter    Kidney cancer Nephew 71   Kidney cancer Nephew        dx  in 70s; great nephew   Leukemia Nephew    Liver disease Nephew    Other Niece        white matter on brain; dx late 94s    CURRENT MEDICATIONS:  Outpatient Encounter Medications as of 08/27/2019  Medication Sig   albuterol (PROVENTIL HFA;VENTOLIN HFA) 108 (90 BASE) MCG/ACT inhaler Inhale 2 puffs into the lungs 3 (three) times daily. Also use the inhaler every 2 hours if needed for worsening shortness of breath and wheezing. (Patient taking differently: Inhale 2 puffs into the lungs 3 (three) times daily as needed for wheezing or shortness of breath. )   Calcium 600-200 MG-UNIT per tablet Take 1 tablet by mouth daily.   clonazePAM (KLONOPIN) 0.5 MG tablet Take 0.5 mg by mouth at bedtime.    FLUoxetine (PROZAC) 40 MG capsule Take 40 mg by mouth daily.   hydrOXYzine (ATARAX/VISTARIL) 25 MG tablet Take 25 mg by mouth as needed.    lisinopril (PRINIVIL,ZESTRIL) 10 MG tablet Take 10 mg by mouth daily.   metoprolol (LOPRESSOR) 50 MG tablet Take 50-75 mg by mouth 2 (two) times daily. Takes 75 mg in the morning and 50 mg in the evening   nabumetone (RELAFEN) 500 MG tablet Take 500 mg by mouth every morning.   omeprazole (PRILOSEC) 40 MG capsule Take 40 mg by mouth daily.    traZODone (DESYREL) 50 MG tablet Take 50 mg by mouth at bedtime.   fluticasone (FLONASE) 50 MCG/ACT nasal spray Place 2 sprays into both nostrils daily.   fluticasone furoate-vilanterol (BREO ELLIPTA) 100-25 MCG/INH AEPB Inhale 1 puff into the lungs daily.   lidocaine-prilocaine (EMLA) cream Apply 1 application topically as needed. Apply pea sized amount to port site 1 hour prior to appointment and cover with plastic wrap (Patient not  taking: Reported on 08/27/2019)   oxyCODONE (OXY IR/ROXICODONE) 5 MG immediate release tablet Take 1 tablet (5 mg total) by mouth every 6 (six) hours as needed for severe pain. (Patient not taking: Reported on 08/27/2019)   prochlorperazine (COMPAZINE) 10 MG tablet Take 1 tablet (10 mg total) by mouth every 6 (six) hours as needed for nausea or vomiting. (Patient not taking: Reported on 04/22/2019)   [DISCONTINUED] triamcinolone cream (KENALOG) 0.1 %    Facility-Administered Encounter Medications as of 08/27/2019  Medication   sodium chloride flush (NS) 0.9 % injection 10 mL    ALLERGIES:  No Known Allergies   PHYSICAL EXAM:  ECOG Performance status: 1  Blood pressure is 160/51.  Pulse rate is 68.  Respirate is 18.  Temperature 98.1.  Saturations 96%. Physical Exam Vitals signs reviewed.  Constitutional:      Appearance: Normal appearance.  Cardiovascular:     Rate and Rhythm: Normal rate and regular rhythm.     Heart sounds: Normal heart sounds.  Pulmonary:     Effort: Pulmonary effort is normal.     Breath sounds: Normal breath sounds.  Abdominal:     General: There is no distension.     Palpations: Abdomen is soft. There is no mass.  Musculoskeletal:        General: No swelling.  Skin:    General: Skin is warm.  Neurological:     General: No focal deficit present.     Mental Status: She is alert and oriented to person, place, and time.  Psychiatric:        Mood and Affect: Mood normal.        Behavior: Behavior normal.  LABORATORY DATA:  I have reviewed the labs as listed.  CBC    Component Value Date/Time   WBC 7.1 08/25/2019 1008   RBC 4.28 08/25/2019 1008   HGB 11.7 (L) 08/25/2019 1008   HCT 39.5 08/25/2019 1008   PLT 276 08/25/2019 1008   MCV 92.3 08/25/2019 1008   MCH 27.3 08/25/2019 1008   MCHC 29.6 (L) 08/25/2019 1008   RDW 15.8 (H) 08/25/2019 1008   LYMPHSABS 2.0 08/25/2019 1008   MONOABS 0.7 08/25/2019 1008   EOSABS 0.2 08/25/2019 1008    BASOSABS 0.1 08/25/2019 1008   CMP Latest Ref Rng & Units 08/25/2019 04/22/2019 03/23/2019  Glucose 70 - 99 mg/dL 121(H) 111(H) 107(H)  BUN 8 - 23 mg/dL _0 Creatinine 0.44 - 1.00 mg/dL 0.62 0.59 0.54  Sodium 135 - 145 mmol/L 139 142 140  Potassium 3.5 - 5.1 mmol/L 4.3 4.4 3.9  Chloride 98 - 111 mmol/L 101 102 104  CO2 22 - 32 mmol/L _1 Calcium 8.9 - 10.3 mg/dL 9.2 9.5 8.9  Total Protein 6.5 - 8.1 g/dL 7.6 7.2 6.8  Total Bilirubin 0.3 - 1.2 mg/dL 1.0 0.7 0.3  Alkaline Phos 38 - 126 U/L 77 67 68  AST 15 - 41 U/L _2 ALT 0 - 44 U/L _3 DIAGNOSTIC IMAGING:  I have independently reviewed the scans and discussed with the patient.   I have reviewed Venita Lick LPN's note and agree with the documentation.  I personally performed a face-to-face visit, made revisions and my assessment and plan is as follows.    ASSESSMENT & PLAN:   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HCC) 1.  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  She was found to have an abnormal chest x-ray followed by CT scan which showed left upper lobe lung mass. - She smoked 1 to 2 packs of cigarettes per day for the last 50 years.  She quit smoking on 10/23/2018. - Denies any significant weight loss.  But has night sweats occasionally.  Denies any hemoptysis. - PET CT scan on 11/17/2018 showed left upper lobe lung mass, 4.9 cm, no evidence of adenopathy or metastasis.  MRI of the brain was negative for intracranial mets. - Left upper lobectomy and lymph node biopsy on 12/15/2018.  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   -Cancer type ID testing shows 96% probability of neuroendocrine carcinoma (small cell/large cell carcinoma). -4 cycles of adjuvant chemotherapy with carboplatin and etoposide from 01/20/2019 through  03/23/2019. -We reviewed results of the CT of the chest with contrast dated 08/25/2019 which showed post left upper lobectomy changes.  Tiny pulmonary nodules unchanged.  Trace pleural thickening and small amount of bibasilar pleural effusions unchanged.  Hepatic steatosis. -I have recommended CT scan in 4 months in follow-up.  2.  Family history: -Mother had lung cancer and was never smoker.  Father had pancreatic cancer. -1 sister has colon cancer in 1 sister with leukemia.  Another sister had breast cancer. -[facility] testing on 12/09/2018 was negative for hereditary malignancies.   Orders placed this encounter:  Orders Placed This Encounter  Procedures   CT Chest W Contrast   CBC with Differential/Platelet   Comprehensive metabolic panel   Vitamin D 25 hydroxy  Derek Jack, MD [place] [phone]

## 2019-08-27 NOTE — Patient Instructions (Signed)
[place] at [facility] Discharge Instructions  You were seen today by Dr. Delton Coombes. He went over your recent lab and scan results. He will see you back in 4 months for labs and follow up.   Thank you for choosing [place] at [facility] to provide your oncology and hematology care.  To afford each patient quality time with our provider, please arrive at least 15 minutes before your scheduled appointment time.   If you have a lab appointment with the [facility] please come in thru the  Main Entrance and check in at the main information desk  You need to re-schedule your appointment should you arrive 10 or more minutes late.  We strive to give you quality time with our providers, and arriving late affects you and other patients whose appointments are after yours.  Also, if you no show three or more times for appointments you may be dismissed from the clinic at the providers discretion.     Again, thank you for choosing [facility].  Our hope is that these requests will decrease the amount of time that you wait before being seen by our physicians.       _____________________________________________________________  Should you have questions after your visit to [facility], please contact our office at [phone] between the hours of 8:00 a.m. and 4:30 p.m.  Voicemails left after 4:00 p.m. will not be returned until the following business day.  For prescription refill requests, have your pharmacy contact our office and allow 72 hours.    Cancer Center Support Programs:   > Cancer Support Group  2nd Tuesday of the month 1pm-2pm, Journey Room

## 2019-09-09 DIAGNOSIS — N6489 Other specified disorders of breast: Secondary | ICD-10-CM | POA: Diagnosis not present

## 2019-09-09 DIAGNOSIS — Z853 Personal history of malignant neoplasm of breast: Secondary | ICD-10-CM | POA: Diagnosis not present

## 2019-09-09 DIAGNOSIS — R928 Other abnormal and inconclusive findings on diagnostic imaging of breast: Secondary | ICD-10-CM | POA: Diagnosis not present

## 2019-09-15 DIAGNOSIS — M545 Low back pain: Secondary | ICD-10-CM | POA: Diagnosis not present

## 2019-09-15 DIAGNOSIS — Z299 Encounter for prophylactic measures, unspecified: Secondary | ICD-10-CM | POA: Diagnosis not present

## 2019-09-15 DIAGNOSIS — Z6839 Body mass index (BMI) 39.0-39.9, adult: Secondary | ICD-10-CM | POA: Diagnosis not present

## 2019-09-15 DIAGNOSIS — C3490 Malignant neoplasm of unspecified part of unspecified bronchus or lung: Secondary | ICD-10-CM | POA: Diagnosis not present

## 2019-09-15 DIAGNOSIS — I10 Essential (primary) hypertension: Secondary | ICD-10-CM | POA: Diagnosis not present

## 2019-09-15 DIAGNOSIS — J449 Chronic obstructive pulmonary disease, unspecified: Secondary | ICD-10-CM | POA: Diagnosis not present

## 2019-09-15 DIAGNOSIS — Z79899 Other long term (current) drug therapy: Secondary | ICD-10-CM | POA: Diagnosis not present

## 2019-09-16 DIAGNOSIS — N6001 Solitary cyst of right breast: Secondary | ICD-10-CM | POA: Diagnosis not present

## 2019-09-16 DIAGNOSIS — N6031 Fibrosclerosis of right breast: Secondary | ICD-10-CM | POA: Diagnosis not present

## 2019-09-16 DIAGNOSIS — N6341 Unspecified lump in right breast, subareolar: Secondary | ICD-10-CM | POA: Diagnosis not present

## 2019-09-26 ENCOUNTER — Encounter (HOSPITAL_COMMUNITY): Payer: Self-pay | Admitting: Hematology

## 2019-09-26 NOTE — Assessment & Plan Note (Signed)
1.  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  She was found to have an abnormal chest x-ray followed by CT scan which showed left upper lobe lung mass. - She smoked 1 to 2 packs of cigarettes per day for the last 50 years.  She quit smoking on 10/23/2018. - Denies any significant weight loss.  But has night sweats occasionally.  Denies any hemoptysis. - PET CT scan on 11/17/2018 showed left upper lobe lung mass, 4.9 cm, no evidence of adenopathy or metastasis.  MRI of the brain was negative for intracranial mets. - Left upper lobectomy and lymph node biopsy on 12/15/2018.  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   -Cancer type ID testing shows 96% probability of neuroendocrine carcinoma (small cell/large cell carcinoma). -4 cycles of adjuvant chemotherapy with carboplatin and etoposide from 01/20/2019 through 03/23/2019. -We reviewed results of the CT of the chest with contrast dated 08/25/2019 which showed post left upper lobectomy changes.  Tiny pulmonary nodules unchanged.  Trace pleural thickening and small amount of bibasilar pleural effusions unchanged.  Hepatic steatosis. -I have recommended CT scan in 4 months in follow-up.  2.  Family history: -Mother had lung cancer and was never smoker.  Father had pancreatic cancer. -1 sister has colon cancer in 1 sister with leukemia.  Another sister had breast cancer. -[facility] testing on 12/09/2018 was negative for hereditary malignancies.

## 2019-10-06 DIAGNOSIS — I10 Essential (primary) hypertension: Secondary | ICD-10-CM | POA: Diagnosis not present

## 2019-10-06 DIAGNOSIS — J449 Chronic obstructive pulmonary disease, unspecified: Secondary | ICD-10-CM | POA: Diagnosis not present

## 2019-10-06 DIAGNOSIS — M159 Polyosteoarthritis, unspecified: Secondary | ICD-10-CM | POA: Diagnosis not present

## 2019-10-07 DIAGNOSIS — Z299 Encounter for prophylactic measures, unspecified: Secondary | ICD-10-CM | POA: Diagnosis not present

## 2019-10-07 DIAGNOSIS — J449 Chronic obstructive pulmonary disease, unspecified: Secondary | ICD-10-CM | POA: Diagnosis not present

## 2019-10-07 DIAGNOSIS — C3490 Malignant neoplasm of unspecified part of unspecified bronchus or lung: Secondary | ICD-10-CM | POA: Diagnosis not present

## 2019-10-07 DIAGNOSIS — Z87891 Personal history of nicotine dependence: Secondary | ICD-10-CM | POA: Diagnosis not present

## 2019-10-07 DIAGNOSIS — J329 Chronic sinusitis, unspecified: Secondary | ICD-10-CM | POA: Diagnosis not present

## 2019-11-05 DIAGNOSIS — J449 Chronic obstructive pulmonary disease, unspecified: Secondary | ICD-10-CM | POA: Diagnosis not present

## 2019-11-05 DIAGNOSIS — Z6839 Body mass index (BMI) 39.0-39.9, adult: Secondary | ICD-10-CM | POA: Diagnosis not present

## 2019-11-05 DIAGNOSIS — F419 Anxiety disorder, unspecified: Secondary | ICD-10-CM | POA: Diagnosis not present

## 2019-11-05 DIAGNOSIS — Z299 Encounter for prophylactic measures, unspecified: Secondary | ICD-10-CM | POA: Diagnosis not present

## 2019-11-05 DIAGNOSIS — M810 Age-related osteoporosis without current pathological fracture: Secondary | ICD-10-CM | POA: Diagnosis not present

## 2019-11-05 DIAGNOSIS — M545 Low back pain: Secondary | ICD-10-CM | POA: Diagnosis not present

## 2019-11-07 DIAGNOSIS — H5203 Hypermetropia, bilateral: Secondary | ICD-10-CM | POA: Diagnosis not present

## 2019-11-07 DIAGNOSIS — H52223 Regular astigmatism, bilateral: Secondary | ICD-10-CM | POA: Diagnosis not present

## 2019-11-07 DIAGNOSIS — H524 Presbyopia: Secondary | ICD-10-CM | POA: Diagnosis not present

## 2019-11-10 ENCOUNTER — Other Ambulatory Visit: Payer: Self-pay

## 2019-11-10 ENCOUNTER — Ambulatory Visit: Payer: Medicare HMO | Attending: Internal Medicine

## 2019-11-10 DIAGNOSIS — Z20822 Contact with and (suspected) exposure to covid-19: Secondary | ICD-10-CM

## 2019-11-10 DIAGNOSIS — Z20828 Contact with and (suspected) exposure to other viral communicable diseases: Secondary | ICD-10-CM | POA: Diagnosis not present

## 2019-11-11 LAB — NOVEL CORONAVIRUS, NAA: SARS-CoV-2, NAA: NOT DETECTED

## 2019-12-09 DIAGNOSIS — J449 Chronic obstructive pulmonary disease, unspecified: Secondary | ICD-10-CM | POA: Diagnosis not present

## 2019-12-09 DIAGNOSIS — I10 Essential (primary) hypertension: Secondary | ICD-10-CM | POA: Diagnosis not present

## 2019-12-09 DIAGNOSIS — Z6839 Body mass index (BMI) 39.0-39.9, adult: Secondary | ICD-10-CM | POA: Diagnosis not present

## 2019-12-09 DIAGNOSIS — C3490 Malignant neoplasm of unspecified part of unspecified bronchus or lung: Secondary | ICD-10-CM | POA: Diagnosis not present

## 2019-12-09 DIAGNOSIS — M25562 Pain in left knee: Secondary | ICD-10-CM | POA: Diagnosis not present

## 2019-12-09 DIAGNOSIS — Z299 Encounter for prophylactic measures, unspecified: Secondary | ICD-10-CM | POA: Diagnosis not present

## 2019-12-16 DIAGNOSIS — Z6839 Body mass index (BMI) 39.0-39.9, adult: Secondary | ICD-10-CM | POA: Diagnosis not present

## 2019-12-16 DIAGNOSIS — M2550 Pain in unspecified joint: Secondary | ICD-10-CM | POA: Diagnosis not present

## 2019-12-16 DIAGNOSIS — Z299 Encounter for prophylactic measures, unspecified: Secondary | ICD-10-CM | POA: Diagnosis not present

## 2019-12-16 DIAGNOSIS — Z87891 Personal history of nicotine dependence: Secondary | ICD-10-CM | POA: Diagnosis not present

## 2019-12-16 DIAGNOSIS — I10 Essential (primary) hypertension: Secondary | ICD-10-CM | POA: Diagnosis not present

## 2019-12-16 DIAGNOSIS — Z79899 Other long term (current) drug therapy: Secondary | ICD-10-CM | POA: Diagnosis not present

## 2019-12-16 DIAGNOSIS — F419 Anxiety disorder, unspecified: Secondary | ICD-10-CM | POA: Diagnosis not present

## 2019-12-16 DIAGNOSIS — C3490 Malignant neoplasm of unspecified part of unspecified bronchus or lung: Secondary | ICD-10-CM | POA: Diagnosis not present

## 2019-12-28 ENCOUNTER — Other Ambulatory Visit: Payer: Self-pay

## 2019-12-28 ENCOUNTER — Inpatient Hospital Stay (HOSPITAL_COMMUNITY): Payer: Medicare HMO | Attending: Hematology

## 2019-12-28 ENCOUNTER — Ambulatory Visit (HOSPITAL_COMMUNITY)
Admission: RE | Admit: 2019-12-28 | Discharge: 2019-12-28 | Disposition: A | Discharge status: Home / Self Care | Payer: Medicare HMO | Source: Ambulatory Visit | Attending: Hematology | Admitting: Hematology

## 2019-12-28 DIAGNOSIS — R0602 Shortness of breath: Secondary | ICD-10-CM | POA: Diagnosis not present

## 2019-12-28 DIAGNOSIS — Z806 Family history of leukemia: Secondary | ICD-10-CM | POA: Insufficient documentation

## 2019-12-28 DIAGNOSIS — Z79899 Other long term (current) drug therapy: Secondary | ICD-10-CM | POA: Diagnosis not present

## 2019-12-28 DIAGNOSIS — Z8249 Family history of ischemic heart disease and other diseases of the circulatory system: Secondary | ICD-10-CM | POA: Diagnosis not present

## 2019-12-28 DIAGNOSIS — G479 Sleep disorder, unspecified: Secondary | ICD-10-CM | POA: Diagnosis not present

## 2019-12-28 DIAGNOSIS — Z8051 Family history of malignant neoplasm of kidney: Secondary | ICD-10-CM | POA: Insufficient documentation

## 2019-12-28 DIAGNOSIS — R200 Anesthesia of skin: Secondary | ICD-10-CM | POA: Diagnosis not present

## 2019-12-28 DIAGNOSIS — Z801 Family history of malignant neoplasm of trachea, bronchus and lung: Secondary | ICD-10-CM | POA: Diagnosis not present

## 2019-12-28 DIAGNOSIS — D0510 Intraductal carcinoma in situ of unspecified breast: Secondary | ICD-10-CM | POA: Insufficient documentation

## 2019-12-28 DIAGNOSIS — C3490 Malignant neoplasm of unspecified part of unspecified bronchus or lung: Secondary | ICD-10-CM | POA: Insufficient documentation

## 2019-12-28 DIAGNOSIS — C3412 Malignant neoplasm of upper lobe, left bronchus or lung: Secondary | ICD-10-CM | POA: Insufficient documentation

## 2019-12-28 DIAGNOSIS — M549 Dorsalgia, unspecified: Secondary | ICD-10-CM | POA: Insufficient documentation

## 2019-12-28 DIAGNOSIS — Z803 Family history of malignant neoplasm of breast: Secondary | ICD-10-CM | POA: Insufficient documentation

## 2019-12-28 DIAGNOSIS — Z800 Family history of malignant neoplasm of digestive organs: Secondary | ICD-10-CM | POA: Insufficient documentation

## 2019-12-28 DIAGNOSIS — M79605 Pain in left leg: Secondary | ICD-10-CM | POA: Diagnosis not present

## 2019-12-28 DIAGNOSIS — Z87442 Personal history of urinary calculi: Secondary | ICD-10-CM | POA: Insufficient documentation

## 2019-12-28 DIAGNOSIS — Z87891 Personal history of nicotine dependence: Secondary | ICD-10-CM | POA: Diagnosis not present

## 2019-12-28 DIAGNOSIS — Z8379 Family history of other diseases of the digestive system: Secondary | ICD-10-CM | POA: Diagnosis not present

## 2019-12-28 LAB — COMPREHENSIVE METABOLIC PANEL
ALT: 21 U/L (ref 0–44)
AST: 23 U/L (ref 15–41)
Albumin: 3.7 g/dL (ref 3.5–5.0)
Alkaline Phosphatase: 79 U/L (ref 38–126)
Anion gap: 9 (ref 5–15)
BUN: 13 mg/dL (ref 8–23)
CO2: 30 mmol/L (ref 22–32)
Calcium: 9.3 mg/dL (ref 8.9–10.3)
Chloride: 101 mmol/L (ref 98–111)
Creatinine, Ser: 0.6 mg/dL (ref 0.44–1.00)
GFR calc Af Amer: >60 mL/min (ref >60)
GFR calc non Af Amer: >60 mL/min (ref >60)
Glucose, Bld: 120 mg/dL — ABNORMAL HIGH (ref 70–99)
Potassium: 4.2 mmol/L (ref 3.5–5.1)
Sodium: 140 mmol/L (ref 135–145)
Total Bilirubin: 0.7 mg/dL (ref 0.3–1.2)
Total Protein: 7.3 g/dL (ref 6.5–8.1)

## 2019-12-28 LAB — CBC WITH DIFFERENTIAL/PLATELET
Abs Immature Granulocytes: 0.03 10*3/uL (ref 0.00–0.07)
Basophils Absolute: 0.1 10*3/uL (ref 0.0–0.1)
Basophils Relative: 1 %
Eosinophils Absolute: 0.1 10*3/uL (ref 0.0–0.5)
Eosinophils Relative: 2 %
HCT: 41.2 % (ref 36.0–46.0)
Hemoglobin: 12.5 g/dL (ref 12.0–15.0)
Immature Granulocytes: 0 %
Lymphocytes Relative: 26 %
Lymphs Abs: 1.8 10*3/uL (ref 0.7–4.0)
MCH: 28 pg (ref 26.0–34.0)
MCHC: 30.3 g/dL (ref 30.0–36.0)
MCV: 92.4 fL (ref 80.0–100.0)
Monocytes Absolute: 0.6 10*3/uL (ref 0.1–1.0)
Monocytes Relative: 8 %
Neutro Abs: 4.5 10*3/uL (ref 1.7–7.7)
Neutrophils Relative %: 63 %
Platelets: 268 10*3/uL (ref 150–400)
RBC: 4.46 MIL/uL (ref 3.87–5.11)
RDW: 14.9 % (ref 11.5–15.5)
WBC: 7.2 10*3/uL (ref 4.0–10.5)
nRBC: 0 % (ref 0.0–0.2)

## 2019-12-28 MED ORDER — IOHEXOL 300 MG/ML  SOLN
75.0000 mL | Freq: Once | INTRAMUSCULAR | Status: AC | PRN
  Administered 2019-12-28: 75 mL via INTRAVENOUS

## 2019-12-29 LAB — VITAMIN D 25 HYDROXY (VIT D DEFICIENCY, FRACTURES): Vit D, 25-Hydroxy: 41.2 ng/mL (ref 30–100)

## 2019-12-31 ENCOUNTER — Other Ambulatory Visit: Payer: Self-pay

## 2019-12-31 ENCOUNTER — Encounter (HOSPITAL_COMMUNITY): Payer: Self-pay | Admitting: Hematology

## 2019-12-31 ENCOUNTER — Inpatient Hospital Stay (HOSPITAL_COMMUNITY): Payer: Medicare HMO | Admitting: Hematology

## 2019-12-31 DIAGNOSIS — R200 Anesthesia of skin: Secondary | ICD-10-CM | POA: Diagnosis not present

## 2019-12-31 DIAGNOSIS — M549 Dorsalgia, unspecified: Secondary | ICD-10-CM | POA: Diagnosis not present

## 2019-12-31 DIAGNOSIS — C3490 Malignant neoplasm of unspecified part of unspecified bronchus or lung: Secondary | ICD-10-CM | POA: Diagnosis not present

## 2019-12-31 DIAGNOSIS — C3412 Malignant neoplasm of upper lobe, left bronchus or lung: Secondary | ICD-10-CM | POA: Diagnosis not present

## 2019-12-31 DIAGNOSIS — R0602 Shortness of breath: Secondary | ICD-10-CM | POA: Diagnosis not present

## 2019-12-31 DIAGNOSIS — Z87891 Personal history of nicotine dependence: Secondary | ICD-10-CM | POA: Diagnosis not present

## 2019-12-31 DIAGNOSIS — M79605 Pain in left leg: Secondary | ICD-10-CM | POA: Diagnosis not present

## 2019-12-31 DIAGNOSIS — G479 Sleep disorder, unspecified: Secondary | ICD-10-CM | POA: Diagnosis not present

## 2019-12-31 DIAGNOSIS — D0510 Intraductal carcinoma in situ of unspecified breast: Secondary | ICD-10-CM | POA: Diagnosis not present

## 2019-12-31 DIAGNOSIS — Z79899 Other long term (current) drug therapy: Secondary | ICD-10-CM | POA: Diagnosis not present

## 2019-12-31 NOTE — Assessment & Plan Note (Signed)
1.  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  She was found to have an abnormal chest x-ray followed by CT scan which showed left upper lobe lung mass. - She smoked 1 to 2 packs of cigarettes per day for the last 50 years.  She quit smoking on 10/23/2018. - Denies any significant weight loss.  But has night sweats occasionally.  Denies any hemoptysis. - PET CT scan on 11/17/2018 showed left upper lobe lung mass, 4.9 cm, no evidence of adenopathy or metastasis.  MRI of the brain was negative for intracranial mets. - Left upper lobectomy and lymph node biopsy on 12/15/2018.  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   -Cancer type ID testing shows 96% probability of neuroendocrine carcinoma (small cell/large cell carcinoma). -4 cycles of adjuvant chemotherapy with carboplatin and etoposide from 01/20/2019 through 03/23/2019. -CT of the chest on 08/25/2019 showed left upper lobectomy changes with tiny pulmonary nodules unchanged. -We reviewed results of the CT of the chest dated 12/28/2019 which showed stable postsurgical changes with improved left basilar aeration.  No evidence of recurrence or metastatic disease. -We will see her back in 6 months for follow-up with repeat CT scan of the chest and labs.  2.  Family history: -Mother had lung cancer and was never smoker.  Father had pancreatic cancer. -1 sister has colon cancer in 1 sister with leukemia.  Another sister had breast cancer. -[facility] testing on 12/09/2018 was negative for hereditary malignancies.

## 2019-12-31 NOTE — Progress Notes (Signed)
[place], [place] [address]   CLINIC:  Medical Oncology/Hematology  PCP:  Glenda Chroman, MD [place] [address] [phone]   REASON FOR VISIT:  Follow-up for neuroendocrinelung cancer   BRIEF ONCOLOGIC HISTORY:  Oncology History  DCIS (ductal carcinoma in situ) of breast  10/28/2013 Initial Diagnosis   DCIS (ductal carcinoma in situ) of breast   12/21/2018 Genetic Testing   Negative genetic testing on the multicancer panel.  The Multi-Gene Panel offered by [facility] includes sequencing and/or deletion duplication testing of the following 84 genes: AIP, ALK, APC, ATM, AXIN2,BAP1,  BARD1, BLM, BMPR1A, BRCA1, BRCA2, BRIP1, CASR, CDC73, CDH1, CDK4, CDKN1B, CDKN1C, CDKN2A (p14ARF), CDKN2A (p16INK4a), CEBPA, CHEK2, CTNNA1, DICER1, DIS3L2, EGFR (c.2369C>T, p.Thr790Met variant only), EPCAM (Deletion/duplication testing only), FH, FLCN, GATA2, GPC3, GREM1 (Promoter region deletion/duplication testing only), HOXB13 (c.251G>A, p.Gly84Glu), HRAS, KIT, MAX, MEN1, MET, MITF (c.952G>A, p.Glu318Lys variant only), MLH1, MSH2, MSH3, MSH6, MUTYH, NBN, NF1, NF2, NTHL1, PALB2, PDGFRA, PHOX2B, PMS2, POLD1, POLE, POT1, PRKAR1A, PTCH1, PTEN, RAD50, RAD51C, RAD51D, RB1, RECQL4, RET, RUNX1, SDHAF2, SDHA (sequence changes only), SDHB, SDHC, SDHD, SMAD4, SMARCA4, SMARCB1, SMARCE1, STK11, SUFU, TERC, TERT, TMEM127, TP53, TSC1, TSC2, VHL, WRN and WT1.  The report date is 12/21/2018.   Lung cancer ([place])  12/15/2018 Initial Diagnosis   Lung cancer ([place])   12/21/2018 Genetic Testing   Negative genetic testing on the multicancer panel.  The Multi-Gene Panel offered by [facility] includes sequencing and/or deletion duplication testing of the following 84 genes: AIP, ALK, APC, ATM, AXIN2,BAP1,  BARD1, BLM, BMPR1A, BRCA1, BRCA2, BRIP1, CASR, CDC73, CDH1, CDK4, CDKN1B, CDKN1C, CDKN2A (p14ARF), CDKN2A (p16INK4a), CEBPA, CHEK2, CTNNA1, DICER1, DIS3L2, EGFR (c.2369C>T, p.Thr790Met  variant only), EPCAM (Deletion/duplication testing only), FH, FLCN, GATA2, GPC3, GREM1 (Promoter region deletion/duplication testing only), HOXB13 (c.251G>A, p.Gly84Glu), HRAS, KIT, MAX, MEN1, MET, MITF (c.952G>A, p.Glu318Lys variant only), MLH1, MSH2, MSH3, MSH6, MUTYH, NBN, NF1, NF2, NTHL1, PALB2, PDGFRA, PHOX2B, PMS2, POLD1, POLE, POT1, PRKAR1A, PTCH1, PTEN, RAD50, RAD51C, RAD51D, RB1, RECQL4, RET, RUNX1, SDHAF2, SDHA (sequence changes only), SDHB, SDHC, SDHD, SMAD4, SMARCA4, SMARCB1, SMARCE1, STK11, SUFU, TERC, TERT, TMEM127, TP53, TSC1, TSC2, VHL, WRN and WT1.  The report date is 12/21/2018.   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  The Multi-Gene Panel offered by [facility] includes sequencing and/or deletion duplication testing of the following 84 genes: AIP, ALK, APC, ATM, AXIN2,BAP1,  BARD1, BLM, BMPR1A, BRCA1, BRCA2, BRIP1, CASR, CDC73, CDH1, CDK4, CDKN1B, CDKN1C, CDKN2A (p14ARF), CDKN2A (p16INK4a), CEBPA, CHEK2, CTNNA1, DICER1, DIS3L2, EGFR (c.2369C>T, p.Thr790Met variant only), EPCAM (Deletion/duplication testing only), FH, FLCN, GATA2, GPC3, GREM1 (Promoter region deletion/duplication testing only), HOXB13 (c.251G>A, p.Gly84Glu), HRAS, KIT, MAX, MEN1, MET, MITF (c.952G>A, p.Glu318Lys variant only), MLH1, MSH2, MSH3, MSH6, MUTYH, NBN, NF1, NF2, NTHL1, PALB2, PDGFRA, PHOX2B, PMS2, POLD1, POLE, POT1, PRKAR1A, PTCH1, PTEN, RAD50, RAD51C, RAD51D, RB1, RECQL4, RET, RUNX1, SDHAF2, SDHA (sequence changes only), SDHB, SDHC, SDHD, SMAD4, SMARCA4, SMARCB1, SMARCE1, STK11, SUFU, TERC, TERT, TMEM127, TP53, TSC1, TSC2, VHL, WRN and WT1.  The report date is 12/21/2018.   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[place])  01/12/2019 Initial Diagnosis   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[place])   01/20/2019 -  Chemotherapy   The patient had palonosetron (ALOXI) injection 0.25 mg, 0.25  mg, Intravenous,  Once, 4 of 4 cycles Administration: 0.25 mg (01/20/2019), 0.25 mg (  02/10/2019), 0.25 mg (03/02/2019), 0.25 mg (03/23/2019) pegfilgrastim-cbqv (UDENYCA) injection 6 mg, 6 mg, Subcutaneous, Once, 4 of 4 cycles Administration: 6 mg (01/23/2019), 6 mg (02/13/2019), 6 mg (03/05/2019), 6 mg (03/26/2019) CARBOplatin (PARAPLATIN) 500 mg in sodium chloride 0.9 % 250 mL chemo infusion, 500 mg (100 % of original dose 504.5 mg), Intravenous,  Once, 4 of 4 cycles Dose modification:   (original dose 504.5 mg, Cycle 1),   (original dose 504.5 mg, Cycle 2),   (original dose 504.5 mg, Cycle 3),   (original dose 504.5 mg, Cycle 4) Administration: 500 mg (01/20/2019), 500 mg (02/10/2019), 500 mg (03/02/2019), 500 mg (03/23/2019) etoposide (VEPESID) 200 mg in sodium chloride 0.9 % 500 mL chemo infusion, 100 mg/m2 = 200 mg, Intravenous,  Once, 4 of 4 cycles Administration: 200 mg (01/20/2019), 200 mg (01/21/2019), 200 mg (01/22/2019), 200 mg (02/10/2019), 200 mg (02/11/2019), 200 mg (02/12/2019), 200 mg (03/02/2019), 200 mg (03/03/2019), 200 mg (03/04/2019), 200 mg (03/23/2019), 200 mg (03/24/2019), 200 mg (03/25/2019)  for chemotherapy treatment.       CANCER STAGING: Cancer Staging DCIS (ductal carcinoma in situ) of breast Staging form: Breast, AJCC 7th Edition - Clinical stage from 12/01/2013: Stage 0 (Tis (DCIS), N0, cM0) - Signed by Heath Lark, MD on 12/01/2013 - Pathologic: Stage 0 (Tis (DCIS), N0, cM0) - Signed by Heath Lark, MD on 12/01/2013    INTERVAL HISTORY:  Laura Abbott 74 y.o. female seen for follow-up of lung cancer.  She complains of feeling short of breath on exertion.  Appetite is slightly low at 75%.  Energy levels are 25%.  She reports pain in the back and left leg pain.  However she is seeing an orthopedic doctor soon.  Numbness in the hands has been stable.  REVIEW OF SYSTEMS:  Review of Systems  Respiratory: Positive for shortness of breath.   Neurological: Positive for numbness.    Psychiatric/Behavioral: Positive for sleep disturbance.  All other systems reviewed and are negative.    PAST MEDICAL/SURGICAL HISTORY:  Past Medical History:  Diagnosis Date  . Anxiety   . Arthritis   . Asthma   . Breast cancer ([place])    breast, right DCIS  . COPD (chronic obstructive pulmonary disease) ([place])   . COPD exacerbation ([place]) 01/03/2014   With hypoxia  . Depression   . Emphysema of lung ([place])   . Family history of breast cancer   . Family history of colon cancer   . Family history of kidney cancer   . Family history of lung cancer   . Family history of pancreatic cancer   . GERD (gastroesophageal reflux disease)   . History of kidney stones   . Hyperlipidemia   . Hypertension   . Obesity 01/05/2014  . Osteoporosis   . Osteoporosis, unspecified 12/01/2013  . Pneumonia    x2  . Tobacco abuse 12/01/2013   Past Surgical History:  Procedure Laterality Date  . ABDOMINAL HYSTERECTOMY    . BLADDER SURGERY     had tacked  . BREAST LUMPECTOMY WITH NEEDLE LOCALIZATION AND AXILLARY SENTINEL LYMPH NODE BX Right 11/09/2013   Procedure: BREAST LUMPECTOMY WITH NEEDLE LOCALIZATION AND AXILLARY SENTINEL LYMPH NODE BIOPSY;  Surgeon: Harl Bowie, MD;  Location: [facility];  Service: General;  Laterality: Right;  . BREAST SURGERY Right    lumpectomy  . CHOLECYSTECTOMY    . NODE DISSECTION Left 12/15/2018   Procedure: NODE DISSECTION LEFT LUNG;  Surgeon: Melrose Nakayama, MD;  Location: [facility];  Service: Thoracic;  Laterality: Left;  . PORTACATH PLACEMENT Right 01/19/2019   Procedure: INSERTION PORT-A-CATH;  Surgeon: Aviva Signs, MD;  Location: [facility];  Service: General;  Laterality: Right;  Marland Kitchen VIDEO ASSISTED THORACOSCOPY (VATS)/ LOBECTOMY Left 12/15/2018   Procedure: VIDEO ASSISTED THORACOSCOPY (VATS)/LEFT UPPER LOBECTOMY;  Surgeon: Melrose Nakayama, MD;  Location: [facility] OR;  Service: Thoracic;  Laterality: Left;     SOCIAL HISTORY:  Social History   Socioeconomic  History  . Marital status: Married    Spouse name: Not on file  . Number of children: 3  . Years of education: Not on file  . Highest education level: Not on file  Occupational History  . Occupation: Database administrator    Comment: [employer]    Comment: [place]    Comment: [employer]  Tobacco Use  . Smoking status: Former Smoker    Packs/day: 0.50    Years: 43.00    Pack years: 21.50    Types: Cigarettes    Quit date: 10/24/2018    Years since quitting: 1.1  . Smokeless tobacco: Never Used  Substance and Sexual Activity  . Alcohol use: No  . Drug use: No  . Sexual activity: Not Currently  Other Topics Concern  . Not on file  Social History Narrative  . Not on file   Social Determinants of Health   Financial Resource Strain:   . Difficulty of Paying Living Expenses: Not on file  Food Insecurity:   . Worried About Charity fundraiser in the Last Year: Not on file  . Ran Out of Food in the Last Year: Not on file  Transportation Needs:   . Lack of Transportation (Medical): Not on file  . Lack of Transportation (Non-Medical): Not on file  Physical Activity:   . Days of Exercise per Week: Not on file  . Minutes of Exercise per Session: Not on file  Stress:   . Feeling of Stress : Not on file  Social Connections:   . Frequency of Communication with Friends and Family: Not on file  . Frequency of Social Gatherings with Friends and Family: Not on file  . Attends Religious Services: Not on file  . Active Member of Clubs or Organizations: Not on file  . Attends Archivist Meetings: Not on file  . Marital Status: Not on file  Intimate Partner Violence:   . Fear of Current or Ex-Partner: Not on file  . Emotionally Abused: Not on file  . Physically Abused: Not on file  . Sexually Abused: Not on file    FAMILY HISTORY:  Family History  Problem Relation Age of Onset  . Lung cancer Mother 71       non smoker  . Pancreatic cancer Father 56       d. 19  .  Leukemia Sister        dx in her 2s  . Heart disease Sister   . Colon cancer Sister        dx and died in her 67s  . Heart disease Sister   . Lung cancer Maternal Aunt        non-smoker  . Heart disease Sister   . Breast cancer Sister        dx early 50s  . Heart disease Brother   . Cirrhosis Brother        d. 39  . Aneurysm Brother        d. 48s  . Fibromyalgia Daughter   . Kidney  cancer Nephew 71  . Kidney cancer Nephew        dx in 29s; great nephew  . Leukemia Nephew   . Liver disease Nephew   . Other Niece        white matter on brain; dx late 54s    CURRENT MEDICATIONS:  Outpatient Encounter Medications as of 12/31/2019  Medication Sig  . Calcium 600-200 MG-UNIT per tablet Take 1 tablet by mouth daily.  Marland Kitchen FLUoxetine (PROZAC) 40 MG capsule Take 40 mg by mouth daily.  Marland Kitchen lisinopril (PRINIVIL,ZESTRIL) 10 MG tablet Take 10 mg by mouth daily.  . metoprolol (LOPRESSOR) 50 MG tablet Take 50-75 mg by mouth 2 (two) times daily. Takes 75 mg in the morning and 50 mg in the evening  . nabumetone (RELAFEN) 500 MG tablet Take 500 mg by mouth every morning.  Marland Kitchen omeprazole (PRILOSEC) 40 MG capsule Take 40 mg by mouth daily.   Marland Kitchen oxyCODONE (OXY IR/ROXICODONE) 5 MG immediate release tablet Take 1 tablet (5 mg total) by mouth every 6 (six) hours as needed for severe pain.  . traZODone (DESYREL) 50 MG tablet Take 50 mg by mouth at bedtime.  . [DISCONTINUED] fluticasone furoate-vilanterol (BREO ELLIPTA) 100-25 MCG/INH AEPB Inhale 1 puff into the lungs daily.  Marland Kitchen albuterol (PROVENTIL HFA;VENTOLIN HFA) 108 (90 BASE) MCG/ACT inhaler Inhale 2 puffs into the lungs 3 (three) times daily. Also use the inhaler every 2 hours if needed for worsening shortness of breath and wheezing. (Patient not taking: Reported on 12/31/2019)  . clonazePAM (KLONOPIN) 0.5 MG tablet Take 0.5 mg by mouth at bedtime.   . fluticasone (FLONASE) 50 MCG/ACT nasal spray Place 2 sprays into both nostrils daily.  . prochlorperazine  (COMPAZINE) 10 MG tablet Take 1 tablet (10 mg total) by mouth every 6 (six) hours as needed for nausea or vomiting. (Patient not taking: Reported on 04/22/2019)  . [DISCONTINUED] hydrOXYzine (ATARAX/VISTARIL) 25 MG tablet Take 25 mg by mouth as needed.   . [DISCONTINUED] lidocaine-prilocaine (EMLA) cream Apply 1 application topically as needed. Apply pea sized amount to port site 1 hour prior to appointment and cover with plastic wrap (Patient not taking: Reported on 08/27/2019)   Facility-Administered Encounter Medications as of 12/31/2019  Medication  . sodium chloride flush (NS) 0.9 % injection 10 mL    ALLERGIES:  No Known Allergies   PHYSICAL EXAM:  ECOG Performance status: 1  Blood pressure is 160/51.  Pulse rate is 68.  Respirate is 18.  Temperature 98.1.  Saturations 96%. Physical Exam Vitals reviewed.  Constitutional:      Appearance: Normal appearance.  Cardiovascular:     Rate and Rhythm: Normal rate and regular rhythm.     Heart sounds: Normal heart sounds.  Pulmonary:     Effort: Pulmonary effort is normal.     Breath sounds: Normal breath sounds.  Abdominal:     General: There is no distension.     Palpations: Abdomen is soft. There is no mass.  Musculoskeletal:        General: No swelling.  Skin:    General: Skin is warm.  Neurological:     General: No focal deficit present.     Mental Status: She is alert and oriented to person, place, and time.  Psychiatric:        Mood and Affect: Mood normal.        Behavior: Behavior normal.      LABORATORY DATA:  I have reviewed the labs as listed.  CBC  Component Value Date/Time   WBC 7.2 12/28/2019 1008   RBC 4.46 12/28/2019 1008   HGB 12.5 12/28/2019 1008   HCT 41.2 12/28/2019 1008   PLT 268 12/28/2019 1008   MCV 92.4 12/28/2019 1008   MCH 28.0 12/28/2019 1008   MCHC 30.3 12/28/2019 1008   RDW 14.9 12/28/2019 1008   LYMPHSABS 1.8 12/28/2019 1008   MONOABS 0.6 12/28/2019 1008   EOSABS 0.1 12/28/2019  1008   BASOSABS 0.1 12/28/2019 1008   CMP Latest Ref Rng & Units 12/28/2019 08/25/2019 04/22/2019  Glucose 70 - 99 mg/dL 120(H) 121(H) 111(H)  BUN 8 - 23 mg/dL '13 15 14  '$ Creatinine 0.44 - 1.00 mg/dL 0.60 0.62 0.59  Sodium 135 - 145 mmol/L 140 139 142  Potassium 3.5 - 5.1 mmol/L 4.2 4.3 4.4  Chloride 98 - 111 mmol/L 101 101 102  CO2 22 - 32 mmol/L '30 28 28  '$ Calcium 8.9 - 10.3 mg/dL 9.3 9.2 9.5  Total Protein 6.5 - 8.1 g/dL 7.3 7.6 7.2  Total Bilirubin 0.3 - 1.2 mg/dL 0.7 1.0 0.7  Alkaline Phos 38 - 126 U/L 79 77 67  AST 15 - 41 U/L '23 21 20  '$ ALT 0 - 44 U/L '21 18 19       '$ DIAGNOSTIC IMAGING:  I have independently reviewed the scans and discussed with the patient.   I have reviewed Venita Lick LPN's note and agree with the documentation.  I personally performed a face-to-face visit, made revisions and my assessment and plan is as follows.    ASSESSMENT & PLAN:   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HCC) 1.  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  She was found to have an abnormal chest x-ray followed by CT scan which showed left upper lobe lung mass. - She smoked 1 to 2 packs of cigarettes per day for the last 50 years.  She quit smoking on 10/23/2018. - Denies any significant weight loss.  But has night sweats occasionally.  Denies any hemoptysis. - PET CT scan on 11/17/2018 showed left upper lobe lung mass, 4.9 cm, no evidence of adenopathy or metastasis.  MRI of the brain was negative for intracranial mets. - Left upper lobectomy and lymph node biopsy on 12/15/2018.  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   -Cancer type ID testing shows 96% probability of neuroendocrine carcinoma (small cell/large cell carcinoma). -4 cycles of adjuvant chemotherapy with carboplatin and etoposide from 01/20/2019 through  03/23/2019. -CT of the chest on 08/25/2019 showed left upper lobectomy changes with tiny pulmonary nodules unchanged. -We reviewed results of the CT of the chest dated 12/28/2019 which showed stable postsurgical changes with improved left basilar aeration.  No evidence of recurrence or metastatic disease. -We will see her back in 6 months for follow-up with repeat CT scan of the chest and labs.  2.  Family history: -Mother had lung cancer and was never smoker.  Father had pancreatic cancer. -1 sister has colon cancer in 1 sister with leukemia.  Another sister had breast cancer. -[facility] testing on 12/09/2018 was negative for hereditary malignancies.   Orders placed this encounter:  Orders Placed This Encounter  Procedures  . CT Chest W Contrast  . CBC with Differential/Platelet  . Comprehensive metabolic panel      Derek Jack, MD [place]  [facility] [phone]

## 2019-12-31 NOTE — Patient Instructions (Addendum)
[place] at [facility] Discharge Instructions  You were seen today by Dr. Delton Coombes. He went over your recent lab and scan results. He will repeat a CT scan in 6 months prior to your next visit. He will see you back in 6 months for labs and follow up.   Thank you for choosing [place] at [facility] to provide your oncology and hematology care.  To afford each patient quality time with our provider, please arrive at least 15 minutes before your scheduled appointment time.   If you have a lab appointment with the [facility] please come in thru the  Main Entrance and check in at the main information desk  You need to re-schedule your appointment should you arrive 10 or more minutes late.  We strive to give you quality time with our providers, and arriving late affects you and other patients whose appointments are after yours.  Also, if you no show three or more times for appointments you may be dismissed from the clinic at the providers discretion.     Again, thank you for choosing [facility].  Our hope is that these requests will decrease the amount of time that you wait before being seen by our physicians.       _____________________________________________________________  Should you have questions after your visit to [facility], please contact our office at [phone] between the hours of 8:00 a.m. and 4:30 p.m.  Voicemails left after 4:00 p.m. will not be returned until the following business day.  For prescription refill requests, have your pharmacy contact our office and allow 72 hours.    Cancer Center Support Programs:   > Cancer Support Group  2nd Tuesday of the month 1pm-2pm, Journey Room

## 2020-01-01 DIAGNOSIS — M1712 Unilateral primary osteoarthritis, left knee: Secondary | ICD-10-CM | POA: Diagnosis not present

## 2020-01-08 DIAGNOSIS — M1712 Unilateral primary osteoarthritis, left knee: Secondary | ICD-10-CM | POA: Diagnosis not present

## 2020-01-08 DIAGNOSIS — M545 Low back pain: Secondary | ICD-10-CM | POA: Diagnosis not present

## 2020-01-08 DIAGNOSIS — M25551 Pain in right hip: Secondary | ICD-10-CM | POA: Diagnosis not present

## 2020-01-12 ENCOUNTER — Other Ambulatory Visit (HOSPITAL_COMMUNITY): Payer: Self-pay | Admitting: Orthopedic Surgery

## 2020-01-12 DIAGNOSIS — M5416 Radiculopathy, lumbar region: Secondary | ICD-10-CM

## 2020-01-29 ENCOUNTER — Ambulatory Visit (HOSPITAL_COMMUNITY)
Admission: RE | Admit: 2020-01-29 | Discharge: 2020-01-29 | Disposition: A | Discharge status: Home / Self Care | Payer: Medicare HMO | Source: Ambulatory Visit | Attending: Orthopedic Surgery | Admitting: Orthopedic Surgery

## 2020-01-29 ENCOUNTER — Other Ambulatory Visit: Payer: Self-pay

## 2020-01-29 DIAGNOSIS — M5416 Radiculopathy, lumbar region: Secondary | ICD-10-CM

## 2020-01-29 DIAGNOSIS — M545 Low back pain: Secondary | ICD-10-CM | POA: Diagnosis not present

## 2020-02-05 DIAGNOSIS — M1712 Unilateral primary osteoarthritis, left knee: Secondary | ICD-10-CM | POA: Diagnosis not present

## 2020-02-10 DIAGNOSIS — I10 Essential (primary) hypertension: Secondary | ICD-10-CM | POA: Diagnosis not present

## 2020-02-10 DIAGNOSIS — J069 Acute upper respiratory infection, unspecified: Secondary | ICD-10-CM | POA: Diagnosis not present

## 2020-02-10 DIAGNOSIS — J449 Chronic obstructive pulmonary disease, unspecified: Secondary | ICD-10-CM | POA: Diagnosis not present

## 2020-02-10 DIAGNOSIS — C3490 Malignant neoplasm of unspecified part of unspecified bronchus or lung: Secondary | ICD-10-CM | POA: Diagnosis not present

## 2020-02-10 DIAGNOSIS — Z299 Encounter for prophylactic measures, unspecified: Secondary | ICD-10-CM | POA: Diagnosis not present

## 2020-02-10 DIAGNOSIS — Z87891 Personal history of nicotine dependence: Secondary | ICD-10-CM | POA: Diagnosis not present

## 2020-02-17 DIAGNOSIS — J449 Chronic obstructive pulmonary disease, unspecified: Secondary | ICD-10-CM | POA: Diagnosis not present

## 2020-02-17 DIAGNOSIS — M159 Polyosteoarthritis, unspecified: Secondary | ICD-10-CM | POA: Diagnosis not present

## 2020-02-17 DIAGNOSIS — I10 Essential (primary) hypertension: Secondary | ICD-10-CM | POA: Diagnosis not present

## 2020-02-18 DIAGNOSIS — M47816 Spondylosis without myelopathy or radiculopathy, lumbar region: Secondary | ICD-10-CM | POA: Diagnosis not present

## 2020-02-18 DIAGNOSIS — M545 Low back pain: Secondary | ICD-10-CM | POA: Diagnosis not present

## 2020-02-29 DIAGNOSIS — E559 Vitamin D deficiency, unspecified: Secondary | ICD-10-CM | POA: Diagnosis not present

## 2020-02-29 DIAGNOSIS — M810 Age-related osteoporosis without current pathological fracture: Secondary | ICD-10-CM | POA: Diagnosis not present

## 2020-02-29 DIAGNOSIS — R5383 Other fatigue: Secondary | ICD-10-CM | POA: Diagnosis not present

## 2020-03-01 DIAGNOSIS — I10 Essential (primary) hypertension: Secondary | ICD-10-CM | POA: Diagnosis not present

## 2020-03-01 DIAGNOSIS — J449 Chronic obstructive pulmonary disease, unspecified: Secondary | ICD-10-CM | POA: Diagnosis not present

## 2020-03-01 DIAGNOSIS — M159 Polyosteoarthritis, unspecified: Secondary | ICD-10-CM | POA: Diagnosis not present

## 2020-03-15 DIAGNOSIS — M47816 Spondylosis without myelopathy or radiculopathy, lumbar region: Secondary | ICD-10-CM | POA: Diagnosis not present

## 2020-03-15 DIAGNOSIS — M545 Low back pain: Secondary | ICD-10-CM | POA: Diagnosis not present

## 2020-03-17 DIAGNOSIS — C3490 Malignant neoplasm of unspecified part of unspecified bronchus or lung: Secondary | ICD-10-CM | POA: Diagnosis not present

## 2020-03-17 DIAGNOSIS — M1990 Unspecified osteoarthritis, unspecified site: Secondary | ICD-10-CM | POA: Diagnosis not present

## 2020-03-17 DIAGNOSIS — J449 Chronic obstructive pulmonary disease, unspecified: Secondary | ICD-10-CM | POA: Diagnosis not present

## 2020-03-17 DIAGNOSIS — Z299 Encounter for prophylactic measures, unspecified: Secondary | ICD-10-CM | POA: Diagnosis not present

## 2020-03-17 DIAGNOSIS — I10 Essential (primary) hypertension: Secondary | ICD-10-CM | POA: Diagnosis not present

## 2020-03-23 DIAGNOSIS — M47896 Other spondylosis, lumbar region: Secondary | ICD-10-CM | POA: Diagnosis not present

## 2020-03-30 DIAGNOSIS — M47896 Other spondylosis, lumbar region: Secondary | ICD-10-CM | POA: Diagnosis not present

## 2020-03-31 DIAGNOSIS — M47896 Other spondylosis, lumbar region: Secondary | ICD-10-CM | POA: Diagnosis not present

## 2020-04-04 DIAGNOSIS — M47896 Other spondylosis, lumbar region: Secondary | ICD-10-CM | POA: Diagnosis not present

## 2020-04-06 DIAGNOSIS — M47896 Other spondylosis, lumbar region: Secondary | ICD-10-CM | POA: Diagnosis not present

## 2020-04-11 DIAGNOSIS — M47896 Other spondylosis, lumbar region: Secondary | ICD-10-CM | POA: Diagnosis not present

## 2020-04-13 DIAGNOSIS — M47896 Other spondylosis, lumbar region: Secondary | ICD-10-CM | POA: Diagnosis not present

## 2020-04-20 DIAGNOSIS — M47896 Other spondylosis, lumbar region: Secondary | ICD-10-CM | POA: Diagnosis not present

## 2020-04-23 IMAGING — CT CT CHEST W/ CM
2 of 3 series · 15 of 36 positions shown, 18 images · IV contrast (Omnipaque or Isovue)
Comparison: Chest CT 08/25/2019 and 04/21/2019.

CLINICAL DATA: Follow-up stage II A left upper lobe neuroendocrine
carcinoma post left upper lobectomy and adjuvant chemotherapy.
Remote breast cancer.

EXAM:
CT CHEST WITH CONTRAST
TECHNIQUE: Multidetector CT imaging of the chest was performed during
intravenous contrast administration.
CONTRAST:  75mL OMNIPAQUE IOHEXOL 300 MG/ML  SOLN

[Series 2: routine chest with · axial · 0.60mm/px · z∈[+1230,+1466]mm · 12 of 140 slices shown, 15 images]
[im 11/140  mediastinal]
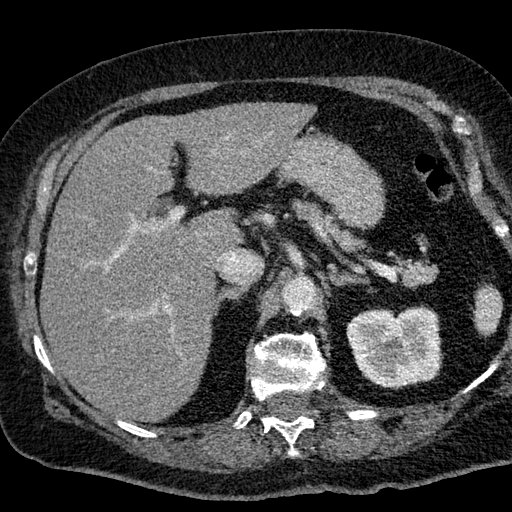
[im 11/140  lung]
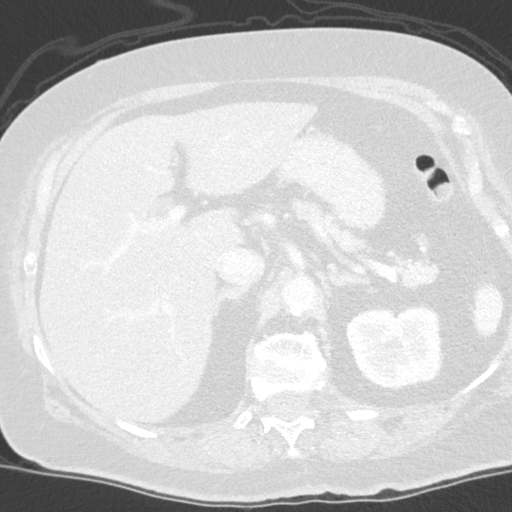
[im 21/140  lung]
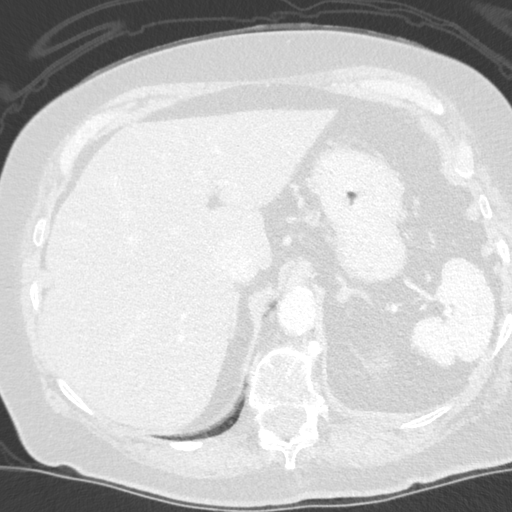
[im 31/140  lung]
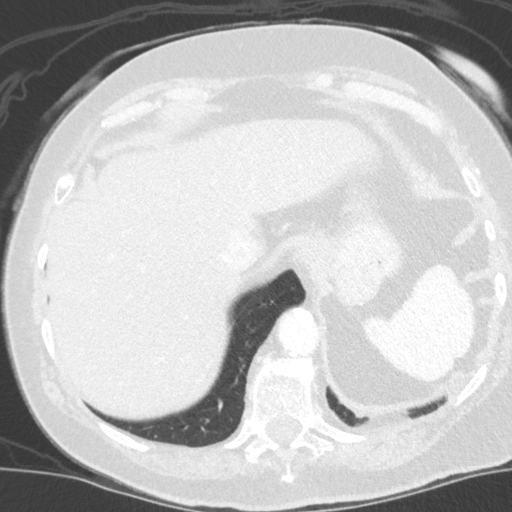
[im 42/140  lung]
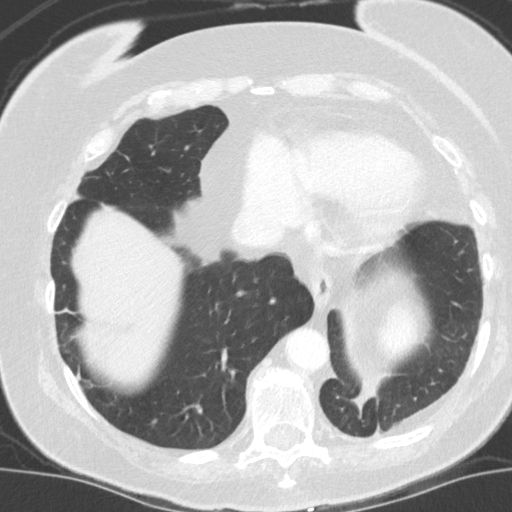
[im 52/140  mediastinal]
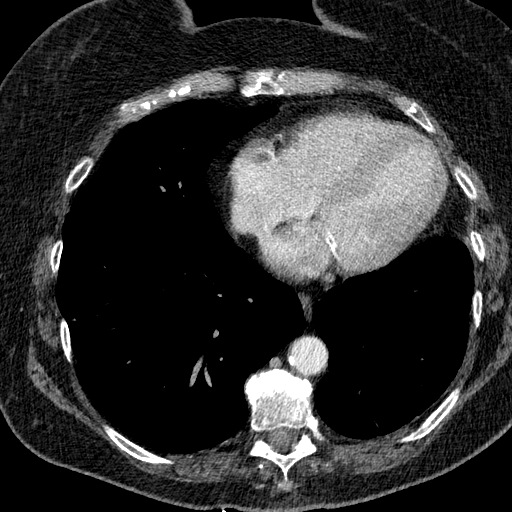
[im 52/140  lung]
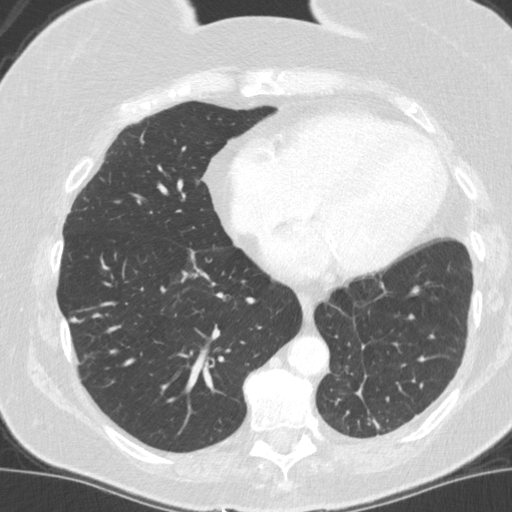
[im 62/140  lung]
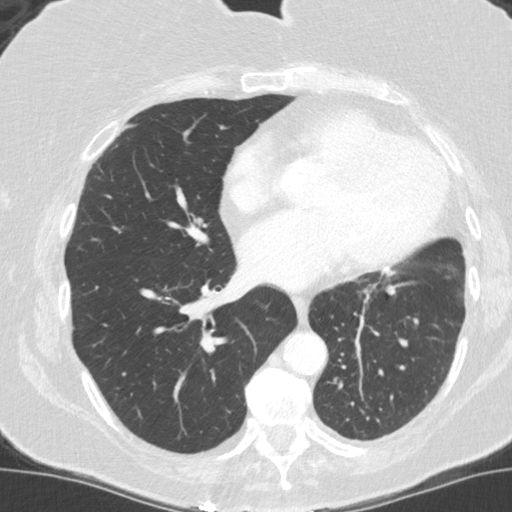
[im 78/140  lung]
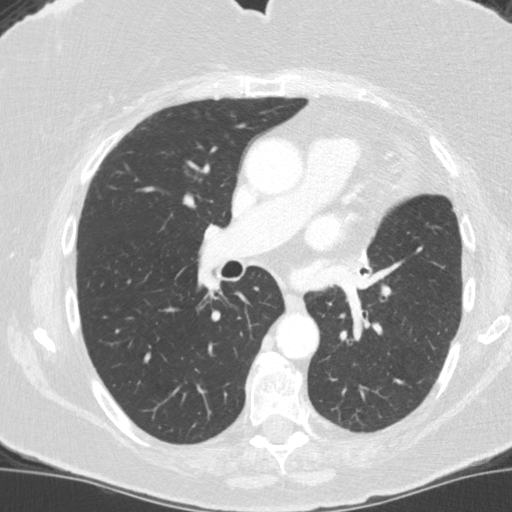
[im 88/140  lung]
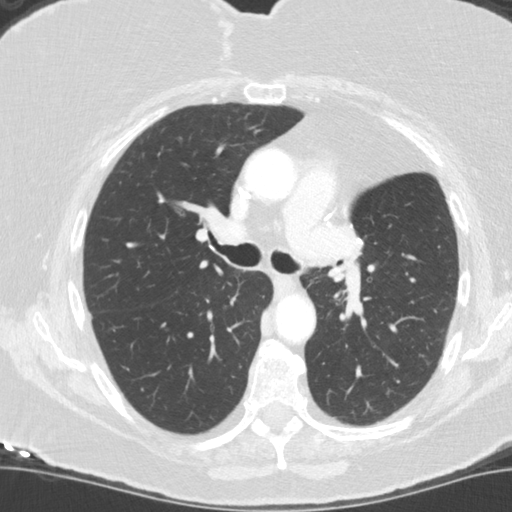
[im 98/140  mediastinal]
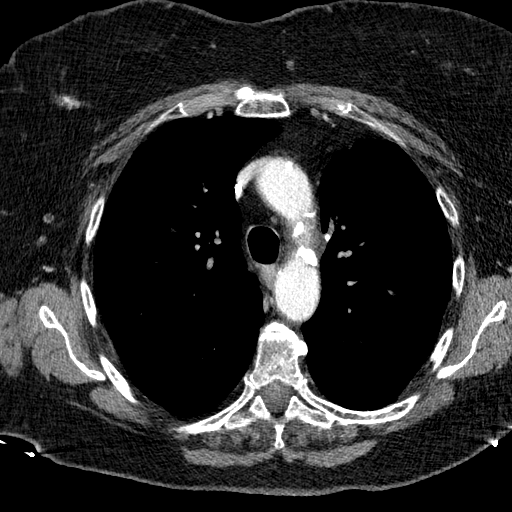
[im 98/140  lung]
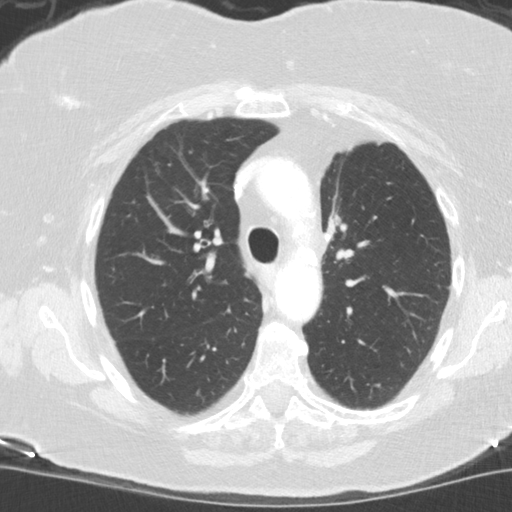
[im 109/140  lung]
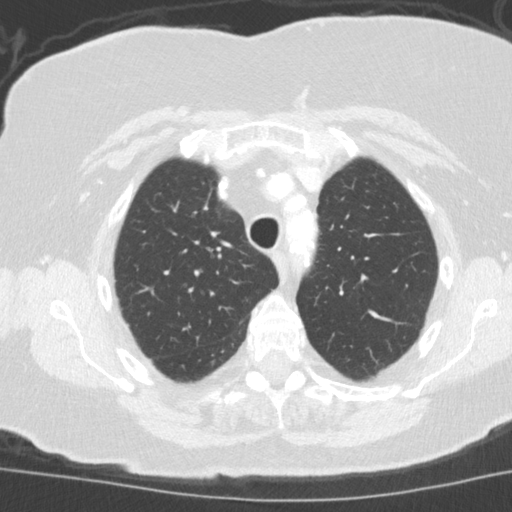
[im 119/140  lung]
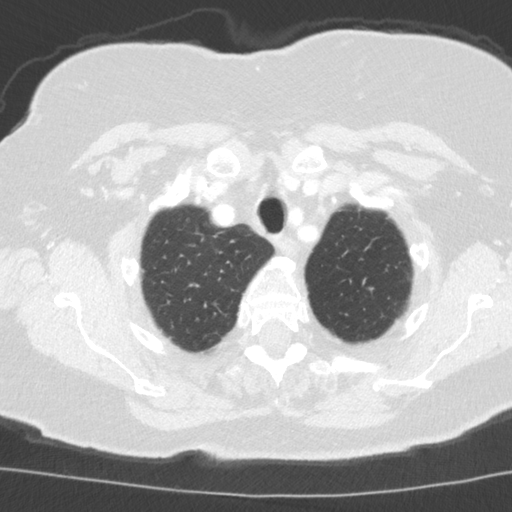
[im 129/140  lung]
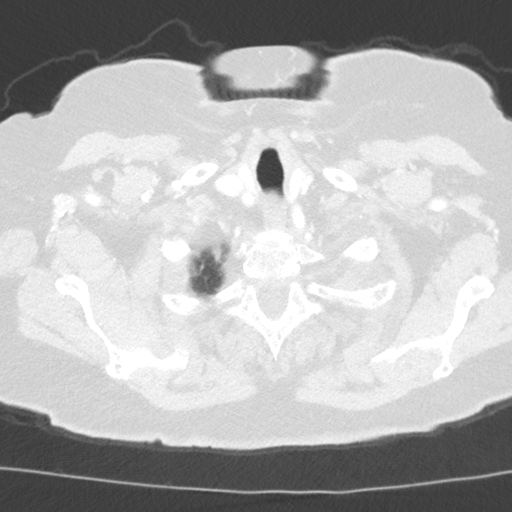

[Series 5: coronal · coronal · 0.57mm/px · 3 of 134 slices shown]
[im 27/134  lung]
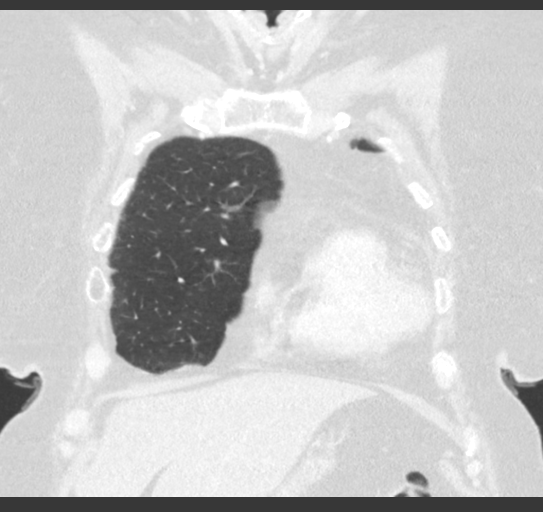
[im 54/134  lung]
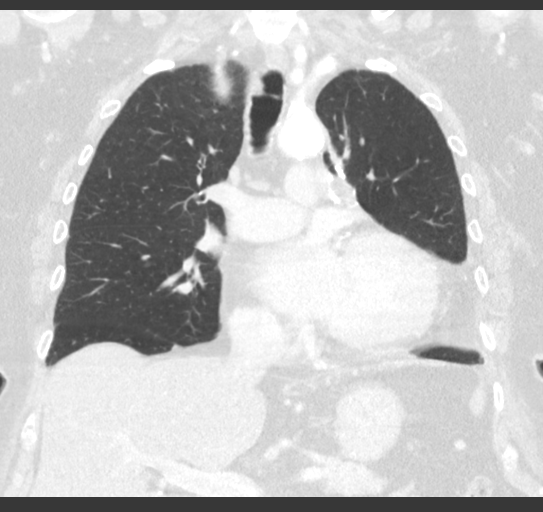
[im 80/134  lung]
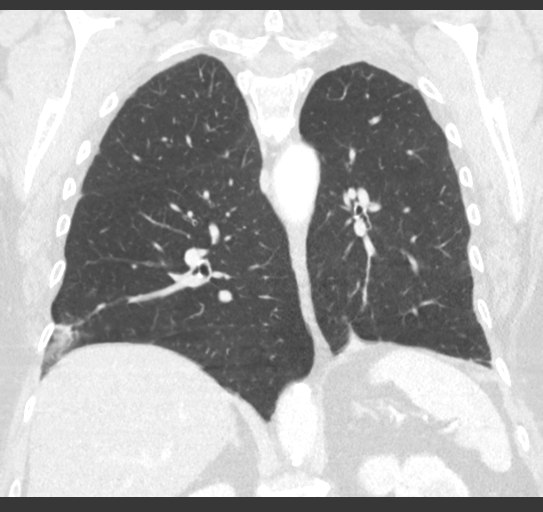

[15 of 36 positions shown; findings below may reference images not displayed]

FINDINGS: Cardiovascular: Atherosclerosis of the aorta, great vessels and
coronary arteries. Right subclavian Port-A-Cath extends to the mid
SVC. No acute vascular findings. The heart size is normal. There is
no pericardial effusion.

Mediastinum/Nodes: There are no enlarged mediastinal, hilar,
internal mammary or axillary lymph nodes. The thyroid gland, trachea
and esophagus demonstrate no significant findings.

Lungs/Pleura: No pleural effusion or pneumothorax. Status post left
upper lobe resection with improved aeration of the left lung base.
There is scattered linear scarring and nodularity in both lungs
which appear unchanged. No suspicious pulmonary nodule.

Upper abdomen: The visualized upper abdomen appears stable without
suspicious findings.

Musculoskeletal/Chest wall: There is no chest wall mass or
suspicious osseous finding. Stable postsurgical changes and
calcifications within the right breast.
IMPRESSION: 1. Stable postsurgical changes following left upper lobe resection.
Interval improved left basilar aeration.
2. No evidence of local recurrence or metastatic disease.
3. Coronary and Aortic Atherosclerosis ([id number]).

## 2020-04-26 DIAGNOSIS — M47896 Other spondylosis, lumbar region: Secondary | ICD-10-CM | POA: Diagnosis not present

## 2020-04-28 DIAGNOSIS — M47896 Other spondylosis, lumbar region: Secondary | ICD-10-CM | POA: Diagnosis not present

## 2020-05-02 DIAGNOSIS — M545 Low back pain: Secondary | ICD-10-CM | POA: Diagnosis not present

## 2020-05-02 DIAGNOSIS — M47816 Spondylosis without myelopathy or radiculopathy, lumbar region: Secondary | ICD-10-CM | POA: Diagnosis not present

## 2020-05-03 DIAGNOSIS — Z299 Encounter for prophylactic measures, unspecified: Secondary | ICD-10-CM | POA: Diagnosis not present

## 2020-05-03 DIAGNOSIS — M47896 Other spondylosis, lumbar region: Secondary | ICD-10-CM | POA: Diagnosis not present

## 2020-05-03 DIAGNOSIS — I10 Essential (primary) hypertension: Secondary | ICD-10-CM | POA: Diagnosis not present

## 2020-05-03 DIAGNOSIS — R609 Edema, unspecified: Secondary | ICD-10-CM | POA: Diagnosis not present

## 2020-05-03 DIAGNOSIS — J449 Chronic obstructive pulmonary disease, unspecified: Secondary | ICD-10-CM | POA: Diagnosis not present

## 2020-05-03 DIAGNOSIS — C3490 Malignant neoplasm of unspecified part of unspecified bronchus or lung: Secondary | ICD-10-CM | POA: Diagnosis not present

## 2020-05-05 DIAGNOSIS — M47896 Other spondylosis, lumbar region: Secondary | ICD-10-CM | POA: Diagnosis not present

## 2020-05-11 DIAGNOSIS — M47816 Spondylosis without myelopathy or radiculopathy, lumbar region: Secondary | ICD-10-CM | POA: Diagnosis not present

## 2020-05-11 DIAGNOSIS — M545 Low back pain: Secondary | ICD-10-CM | POA: Diagnosis not present

## 2020-05-25 IMAGING — MR MR LUMBAR SPINE W/O CM
4 of 5 series · 14 of 48 positions shown · non-contrast
Comparison: MRI report 02/20/2017.  Actual images not available.

CLINICAL DATA: Severe back and left knee pain. History of breast
cancer.

EXAM:
MRI LUMBAR SPINE WITHOUT CONTRAST
TECHNIQUE: Multiplanar, multisequence MR imaging of the lumbar spine was
performed. No intravenous contrast was administered.

[Series 3: T2 · sagittal · 4.0mm · 0.40mm/px · 5 of 17 slices shown (1 of 3)]
[im 1/17]
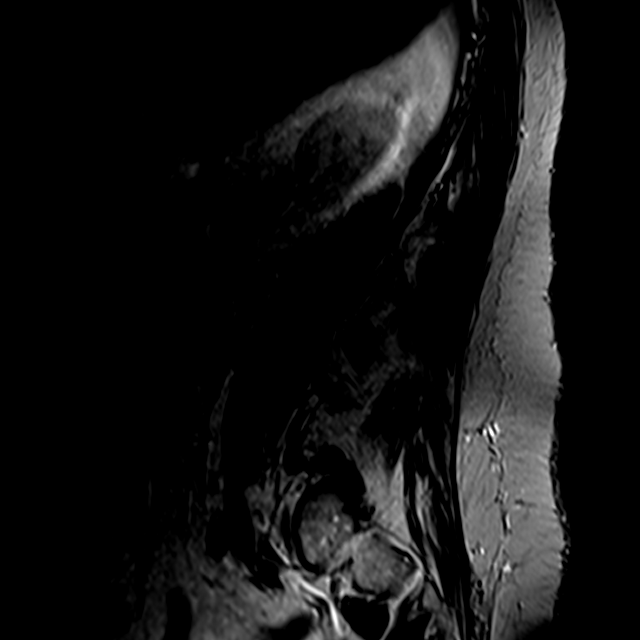
[im 4/17]
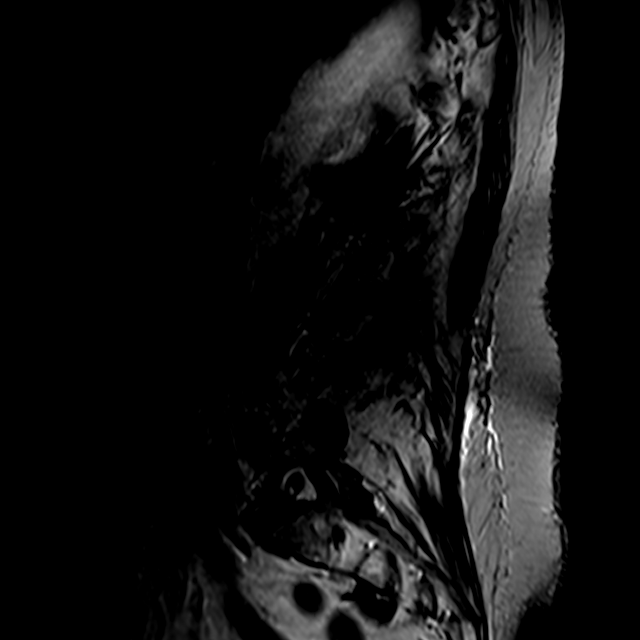
[im 7/17]
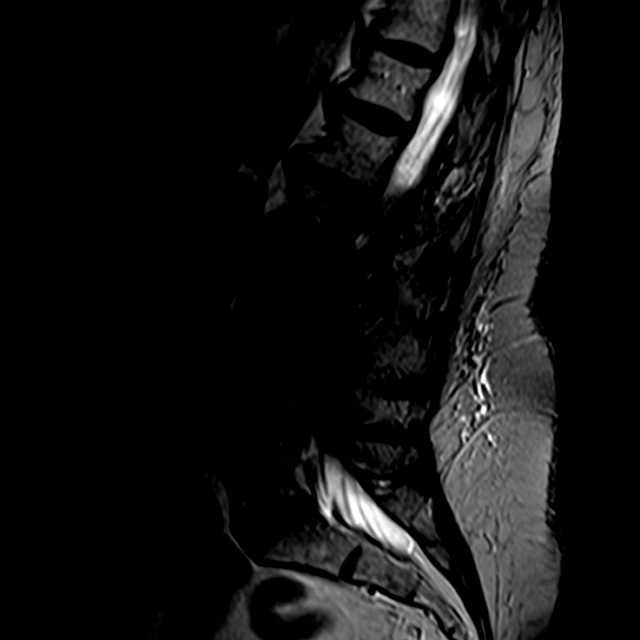
[im 10/17]
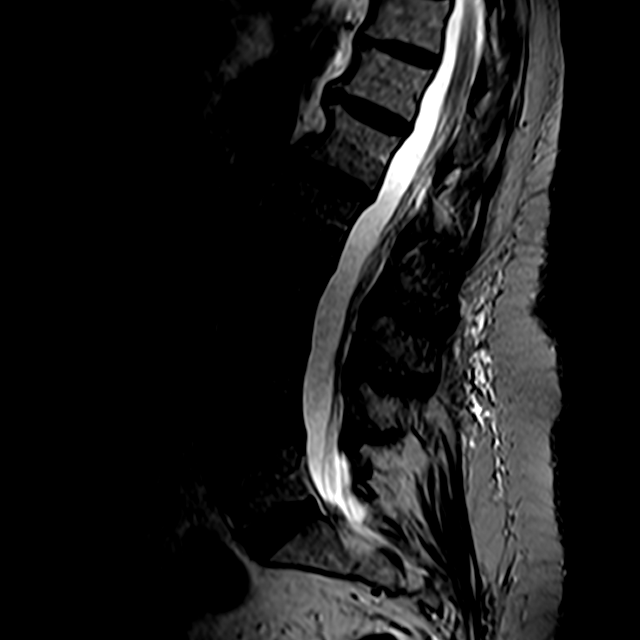
[im 17/17]
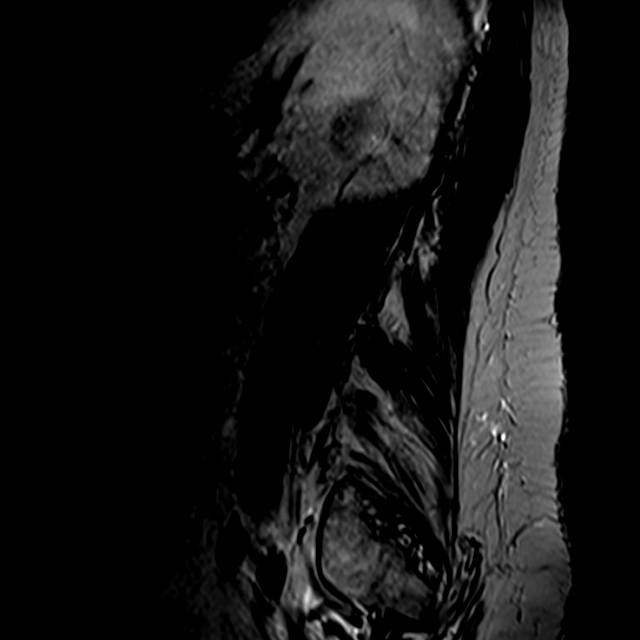

[Series 4: T1 · sagittal · 4.0mm · 0.40mm/px · 3 of 17 slices shown]
[im 1/17]
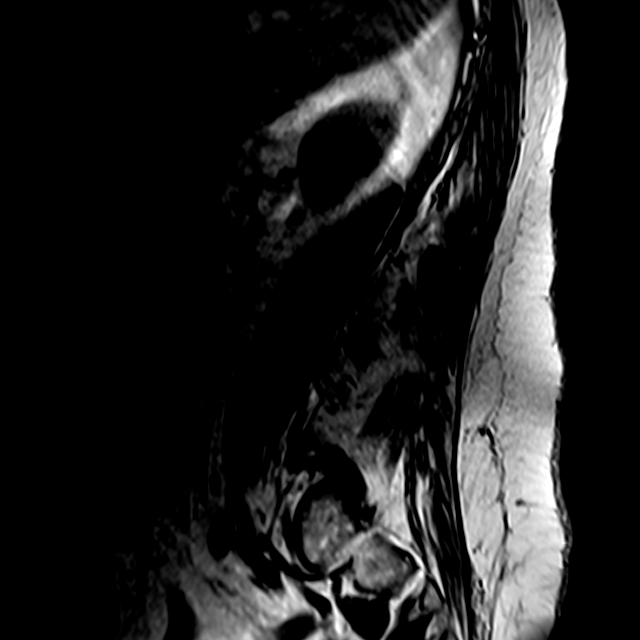
[im 9/17]
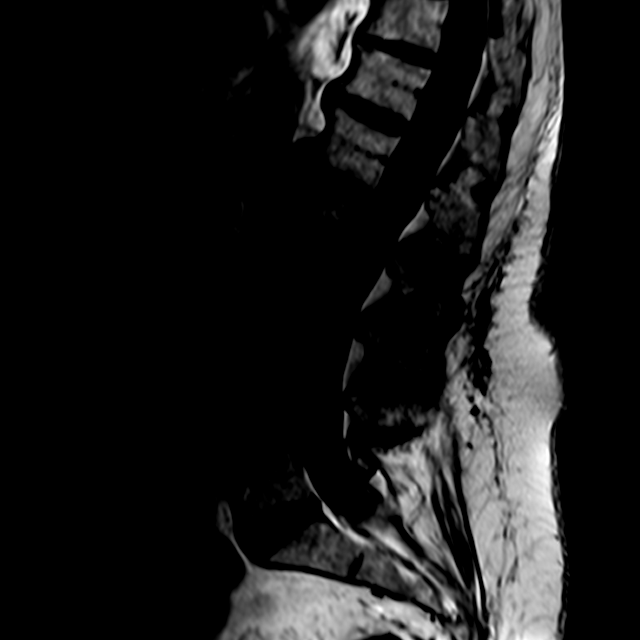
[im 17/17]
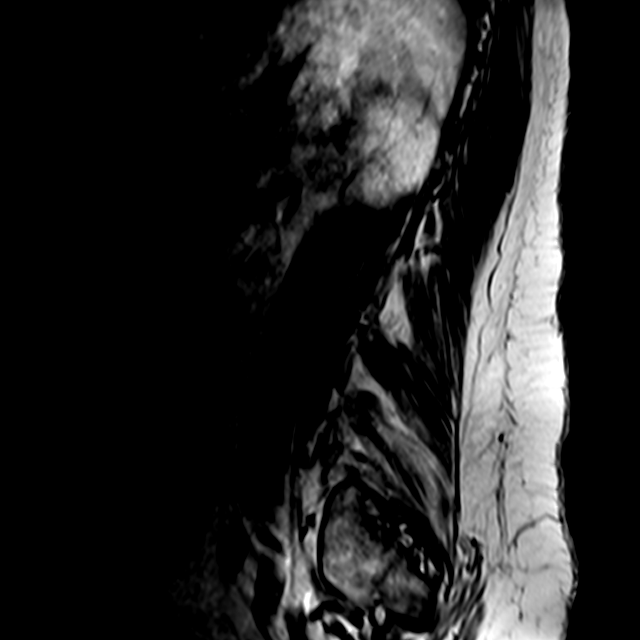

[Series 5: T2 · sagittal · 4.0mm · 0.40mm/px · 3 of 18 slices shown (2 of 3)]
[im 1/18]
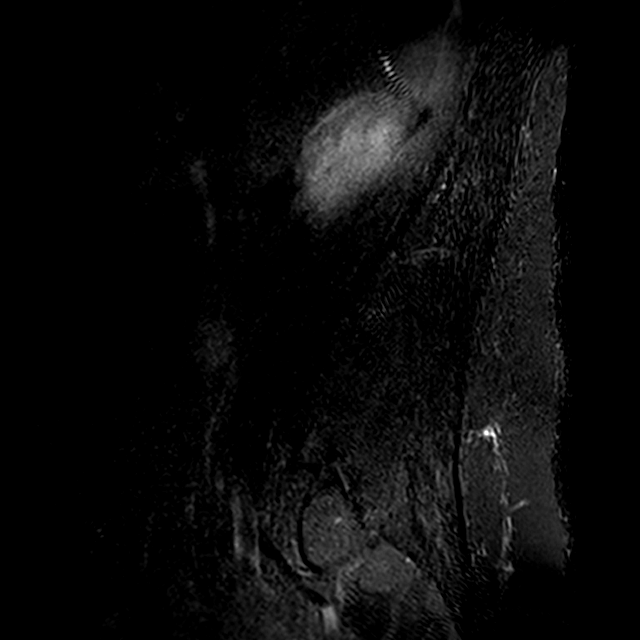
[im 9/18]
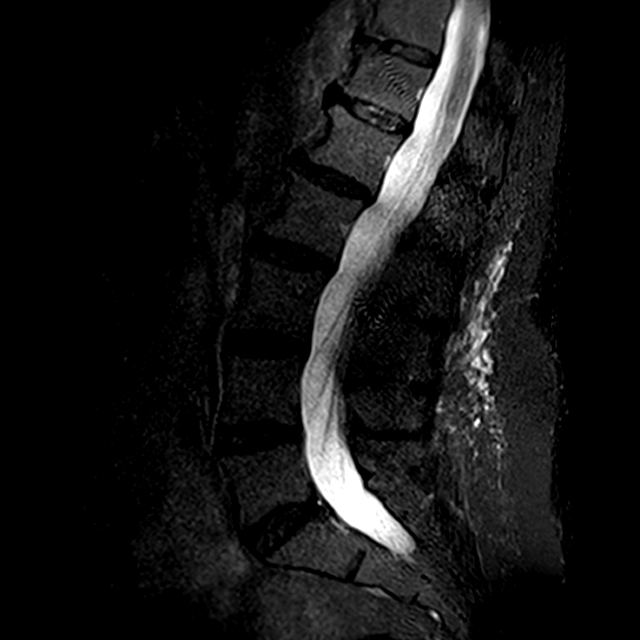
[im 18/18]
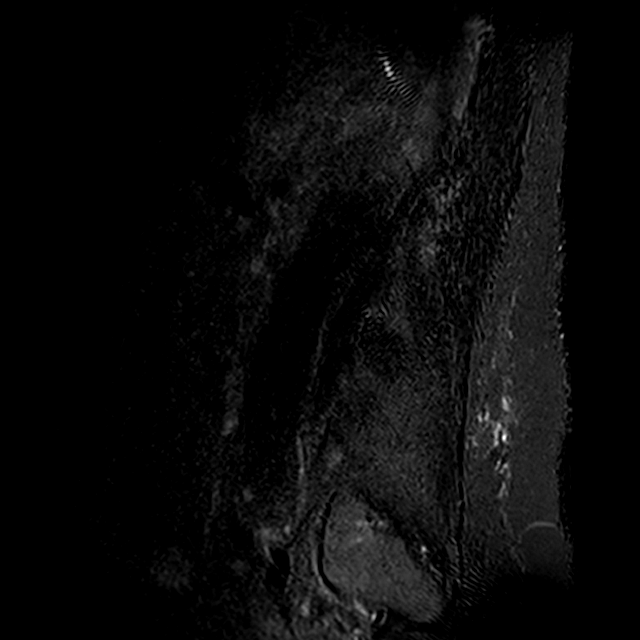

[Series 6: T2 · axial · 4.0mm · 0.42mm/px · z∈[-23,+180]mm · 3 of 56 slices shown (3 of 3)]
[im 8/56]
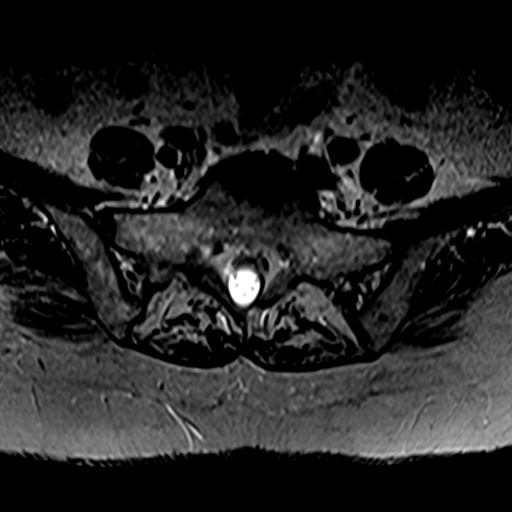
[im 30/56]
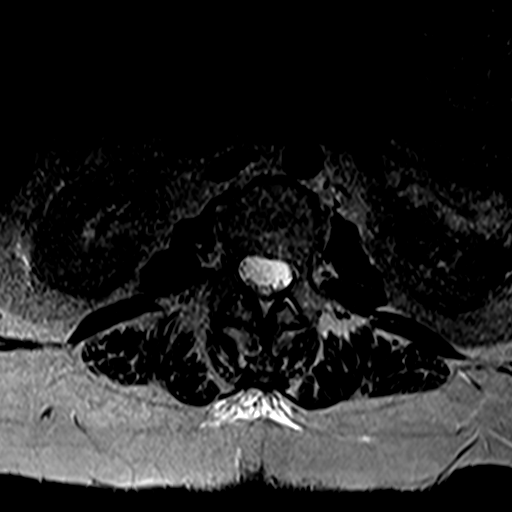
[im 48/56]
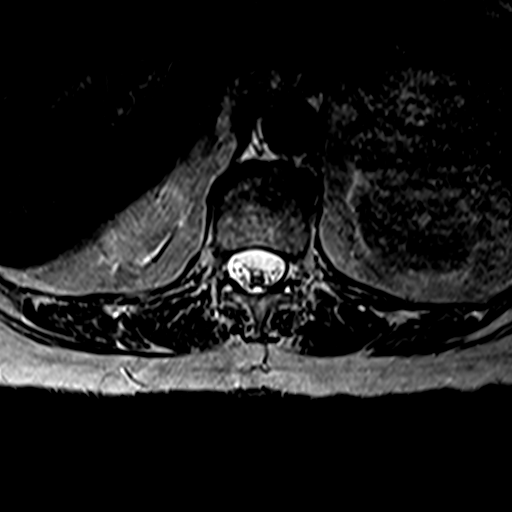

[14 of 48 positions shown; findings below may reference images not displayed]

FINDINGS: Segmentation:  5 lumbar type vertebral bodies.

Alignment:  Normal

Vertebrae: Normal. No evidence of metastatic disease or other focal
bone lesion.

Conus medullaris and cauda equina: Conus extends to the L1 level.
Conus and cauda equina appear normal.

Paraspinal and other soft tissues: Negative

Disc levels:

Minimal non-compressive disc bulges throughout the lumbar region. No
disc herniation. No canal or foraminal stenosis. Mild facet
osteoarthritis in the lower lumbar spine without encroachment upon
the neural spaces. Some potential that the facet arthritis at L5-S1
could relate to low back pain.
IMPRESSION: No evidence of metastatic disease.

No significant degenerative disc disease. No stenosis of the canal
or foramina.

Facet osteoarthritis at L5-S1, with mild edema on the right. Some
potential this could relate to low back pain.

## 2020-05-31 DIAGNOSIS — M545 Low back pain: Secondary | ICD-10-CM | POA: Diagnosis not present

## 2020-05-31 DIAGNOSIS — M47816 Spondylosis without myelopathy or radiculopathy, lumbar region: Secondary | ICD-10-CM | POA: Diagnosis not present

## 2020-06-02 DIAGNOSIS — J449 Chronic obstructive pulmonary disease, unspecified: Secondary | ICD-10-CM | POA: Diagnosis not present

## 2020-06-02 DIAGNOSIS — Z299 Encounter for prophylactic measures, unspecified: Secondary | ICD-10-CM | POA: Diagnosis not present

## 2020-06-02 DIAGNOSIS — F1320 Sedative, hypnotic or anxiolytic dependence, uncomplicated: Secondary | ICD-10-CM | POA: Diagnosis not present

## 2020-06-02 DIAGNOSIS — J441 Chronic obstructive pulmonary disease with (acute) exacerbation: Secondary | ICD-10-CM | POA: Diagnosis not present

## 2020-06-02 DIAGNOSIS — C3490 Malignant neoplasm of unspecified part of unspecified bronchus or lung: Secondary | ICD-10-CM | POA: Diagnosis not present

## 2020-06-09 DIAGNOSIS — Z299 Encounter for prophylactic measures, unspecified: Secondary | ICD-10-CM | POA: Diagnosis not present

## 2020-06-09 DIAGNOSIS — F1120 Opioid dependence, uncomplicated: Secondary | ICD-10-CM | POA: Diagnosis not present

## 2020-06-09 DIAGNOSIS — C50919 Malignant neoplasm of unspecified site of unspecified female breast: Secondary | ICD-10-CM | POA: Diagnosis not present

## 2020-06-09 DIAGNOSIS — Z6839 Body mass index (BMI) 39.0-39.9, adult: Secondary | ICD-10-CM | POA: Diagnosis not present

## 2020-06-09 DIAGNOSIS — C3490 Malignant neoplasm of unspecified part of unspecified bronchus or lung: Secondary | ICD-10-CM | POA: Diagnosis not present

## 2020-06-09 DIAGNOSIS — I10 Essential (primary) hypertension: Secondary | ICD-10-CM | POA: Diagnosis not present

## 2020-06-09 DIAGNOSIS — J449 Chronic obstructive pulmonary disease, unspecified: Secondary | ICD-10-CM | POA: Diagnosis not present

## 2020-06-24 DIAGNOSIS — I10 Essential (primary) hypertension: Secondary | ICD-10-CM | POA: Diagnosis not present

## 2020-06-24 DIAGNOSIS — M159 Polyosteoarthritis, unspecified: Secondary | ICD-10-CM | POA: Diagnosis not present

## 2020-06-24 DIAGNOSIS — J449 Chronic obstructive pulmonary disease, unspecified: Secondary | ICD-10-CM | POA: Diagnosis not present

## 2020-07-01 ENCOUNTER — Inpatient Hospital Stay (HOSPITAL_COMMUNITY): Payer: Medicare HMO | Attending: Hematology

## 2020-07-01 ENCOUNTER — Other Ambulatory Visit: Payer: Self-pay

## 2020-07-01 ENCOUNTER — Ambulatory Visit (HOSPITAL_COMMUNITY)
Admission: RE | Admit: 2020-07-01 | Discharge: 2020-07-01 | Disposition: A | Discharge status: Home / Self Care | Payer: Medicare HMO | Source: Ambulatory Visit | Attending: Hematology | Admitting: Hematology

## 2020-07-01 DIAGNOSIS — Z9049 Acquired absence of other specified parts of digestive tract: Secondary | ICD-10-CM | POA: Insufficient documentation

## 2020-07-01 DIAGNOSIS — Z8051 Family history of malignant neoplasm of kidney: Secondary | ICD-10-CM | POA: Diagnosis not present

## 2020-07-01 DIAGNOSIS — Z8249 Family history of ischemic heart disease and other diseases of the circulatory system: Secondary | ICD-10-CM | POA: Insufficient documentation

## 2020-07-01 DIAGNOSIS — I700 Atherosclerosis of aorta: Secondary | ICD-10-CM | POA: Diagnosis not present

## 2020-07-01 DIAGNOSIS — R5383 Other fatigue: Secondary | ICD-10-CM | POA: Diagnosis not present

## 2020-07-01 DIAGNOSIS — Z853 Personal history of malignant neoplasm of breast: Secondary | ICD-10-CM | POA: Insufficient documentation

## 2020-07-01 DIAGNOSIS — R0602 Shortness of breath: Secondary | ICD-10-CM | POA: Insufficient documentation

## 2020-07-01 DIAGNOSIS — Z902 Acquired absence of lung [part of]: Secondary | ICD-10-CM | POA: Insufficient documentation

## 2020-07-01 DIAGNOSIS — Z87891 Personal history of nicotine dependence: Secondary | ICD-10-CM | POA: Diagnosis not present

## 2020-07-01 DIAGNOSIS — C3490 Malignant neoplasm of unspecified part of unspecified bronchus or lung: Secondary | ICD-10-CM | POA: Diagnosis not present

## 2020-07-01 DIAGNOSIS — Z806 Family history of leukemia: Secondary | ICD-10-CM | POA: Insufficient documentation

## 2020-07-01 DIAGNOSIS — Z801 Family history of malignant neoplasm of trachea, bronchus and lung: Secondary | ICD-10-CM | POA: Diagnosis not present

## 2020-07-01 DIAGNOSIS — J984 Other disorders of lung: Secondary | ICD-10-CM | POA: Diagnosis not present

## 2020-07-01 DIAGNOSIS — Z800 Family history of malignant neoplasm of digestive organs: Secondary | ICD-10-CM | POA: Insufficient documentation

## 2020-07-01 DIAGNOSIS — Z8379 Family history of other diseases of the digestive system: Secondary | ICD-10-CM | POA: Diagnosis not present

## 2020-07-01 DIAGNOSIS — Z79899 Other long term (current) drug therapy: Secondary | ICD-10-CM | POA: Insufficient documentation

## 2020-07-01 DIAGNOSIS — R519 Headache, unspecified: Secondary | ICD-10-CM | POA: Insufficient documentation

## 2020-07-01 DIAGNOSIS — R200 Anesthesia of skin: Secondary | ICD-10-CM | POA: Insufficient documentation

## 2020-07-01 DIAGNOSIS — C7A8 Other malignant neuroendocrine tumors: Secondary | ICD-10-CM | POA: Insufficient documentation

## 2020-07-01 DIAGNOSIS — Z8269 Family history of other diseases of the musculoskeletal system and connective tissue: Secondary | ICD-10-CM | POA: Diagnosis not present

## 2020-07-01 DIAGNOSIS — M1990 Unspecified osteoarthritis, unspecified site: Secondary | ICD-10-CM | POA: Diagnosis not present

## 2020-07-01 DIAGNOSIS — Z803 Family history of malignant neoplasm of breast: Secondary | ICD-10-CM | POA: Insufficient documentation

## 2020-07-01 DIAGNOSIS — C3412 Malignant neoplasm of upper lobe, left bronchus or lung: Secondary | ICD-10-CM | POA: Diagnosis not present

## 2020-07-01 DIAGNOSIS — I2510 Atherosclerotic heart disease of native coronary artery without angina pectoris: Secondary | ICD-10-CM | POA: Diagnosis not present

## 2020-07-01 LAB — CBC WITH DIFFERENTIAL/PLATELET
Abs Immature Granulocytes: 0.04 10*3/uL (ref 0.00–0.07)
Basophils Absolute: 0 10*3/uL (ref 0.0–0.1)
Basophils Relative: 1 %
Eosinophils Absolute: 0.1 10*3/uL (ref 0.0–0.5)
Eosinophils Relative: 1 %
HCT: 43.2 % (ref 36.0–46.0)
Hemoglobin: 13 g/dL (ref 12.0–15.0)
Immature Granulocytes: 1 %
Lymphocytes Relative: 27 %
Lymphs Abs: 2.1 10*3/uL (ref 0.7–4.0)
MCH: 28.8 pg (ref 26.0–34.0)
MCHC: 30.1 g/dL (ref 30.0–36.0)
MCV: 95.6 fL (ref 80.0–100.0)
Monocytes Absolute: 0.5 10*3/uL (ref 0.1–1.0)
Monocytes Relative: 7 %
Neutro Abs: 5 10*3/uL (ref 1.7–7.7)
Neutrophils Relative %: 63 %
Platelets: 278 10*3/uL (ref 150–400)
RBC: 4.52 MIL/uL (ref 3.87–5.11)
RDW: 15.1 % (ref 11.5–15.5)
WBC: 7.9 10*3/uL (ref 4.0–10.5)
nRBC: 0 % (ref 0.0–0.2)

## 2020-07-01 LAB — COMPREHENSIVE METABOLIC PANEL
ALT: 20 U/L (ref 0–44)
AST: 24 U/L (ref 15–41)
Albumin: 3.8 g/dL (ref 3.5–5.0)
Alkaline Phosphatase: 80 U/L (ref 38–126)
Anion gap: 10 (ref 5–15)
BUN: 12 mg/dL (ref 8–23)
CO2: 29 mmol/L (ref 22–32)
Calcium: 9.1 mg/dL (ref 8.9–10.3)
Chloride: 101 mmol/L (ref 98–111)
Creatinine, Ser: 0.51 mg/dL (ref 0.44–1.00)
GFR calc Af Amer: >60 mL/min (ref >60)
GFR calc non Af Amer: >60 mL/min (ref >60)
Glucose, Bld: 115 mg/dL — ABNORMAL HIGH (ref 70–99)
Potassium: 4.2 mmol/L (ref 3.5–5.1)
Sodium: 140 mmol/L (ref 135–145)
Total Bilirubin: 0.7 mg/dL (ref 0.3–1.2)
Total Protein: 7.3 g/dL (ref 6.5–8.1)

## 2020-07-01 MED ORDER — IOHEXOL 300 MG/ML  SOLN
75.0000 mL | Freq: Once | INTRAMUSCULAR | Status: AC | PRN
  Administered 2020-07-01: 75 mL via INTRAVENOUS

## 2020-07-07 ENCOUNTER — Inpatient Hospital Stay (HOSPITAL_COMMUNITY): Payer: Medicare HMO | Admitting: Hematology

## 2020-07-07 ENCOUNTER — Other Ambulatory Visit: Payer: Self-pay

## 2020-07-07 VITALS — BP 156/63 | HR 66 | Temp 97.5°F | Resp 20 | Wt 207.1 lb

## 2020-07-07 DIAGNOSIS — C7A8 Other malignant neuroendocrine tumors: Secondary | ICD-10-CM | POA: Diagnosis not present

## 2020-07-07 DIAGNOSIS — Z95828 Presence of other vascular implants and grafts: Secondary | ICD-10-CM | POA: Diagnosis not present

## 2020-07-07 DIAGNOSIS — I700 Atherosclerosis of aorta: Secondary | ICD-10-CM | POA: Diagnosis not present

## 2020-07-07 DIAGNOSIS — C3490 Malignant neoplasm of unspecified part of unspecified bronchus or lung: Secondary | ICD-10-CM

## 2020-07-07 DIAGNOSIS — M1990 Unspecified osteoarthritis, unspecified site: Secondary | ICD-10-CM | POA: Diagnosis not present

## 2020-07-07 DIAGNOSIS — R0602 Shortness of breath: Secondary | ICD-10-CM | POA: Diagnosis not present

## 2020-07-07 DIAGNOSIS — Z79899 Other long term (current) drug therapy: Secondary | ICD-10-CM | POA: Diagnosis not present

## 2020-07-07 DIAGNOSIS — R519 Headache, unspecified: Secondary | ICD-10-CM | POA: Diagnosis not present

## 2020-07-07 DIAGNOSIS — Z853 Personal history of malignant neoplasm of breast: Secondary | ICD-10-CM | POA: Diagnosis not present

## 2020-07-07 DIAGNOSIS — R200 Anesthesia of skin: Secondary | ICD-10-CM | POA: Diagnosis not present

## 2020-07-07 DIAGNOSIS — R5383 Other fatigue: Secondary | ICD-10-CM | POA: Diagnosis not present

## 2020-07-07 MED ORDER — SODIUM CHLORIDE 0.9% FLUSH
10.0000 mL | Freq: Once | INTRAVENOUS | Status: AC
  Administered 2020-07-07: 10 mL via INTRAVENOUS

## 2020-07-07 MED ORDER — HEPARIN SOD (PORK) LOCK FLUSH 100 UNIT/ML IV SOLN: 500.0000 [IU] | Freq: Once | INTRAVENOUS | Status: DC

## 2020-07-07 MED ORDER — HEPARIN SOD (PORK) LOCK FLUSH 100 UNIT/ML IV SOLN
500.0000 [IU] | Freq: Once | INTRAVENOUS | Status: AC
  Administered 2020-07-07: 500 [IU] via INTRAVENOUS

## 2020-07-07 MED ORDER — SODIUM CHLORIDE 0.9% FLUSH: 10.0000 mL | INTRAVENOUS | Status: DC | PRN

## 2020-07-07 NOTE — Progress Notes (Signed)
[place], Laura Abbott   CLINIC:  Medical Oncology/Hematology  PCP:  Laura Chroman, MD [address] / [place] [address] [phone]   REASON FOR VISIT:  Follow-up for left neuroendocrine lung cancer  PRIOR THERAPY:  1. Left upper lobectomy on 12/15/2018. 2. Carboplatin and etoposide x 4 cycles from 01/20/2019 to 03/23/2019.  NGS Results: Not done  CURRENT THERAPY: Observation  BRIEF ONCOLOGIC HISTORY:  Oncology History  DCIS (ductal carcinoma in situ) of breast  10/28/2013 Initial Diagnosis   DCIS (ductal carcinoma in situ) of breast   12/21/2018 Genetic Testing   Negative genetic testing on the multicancer panel.  The Multi-Gene Panel offered by [facility] includes sequencing and/or deletion duplication testing of the following 84 genes: AIP, ALK, APC, ATM, AXIN2,BAP1,  BARD1, BLM, BMPR1A, BRCA1, BRCA2, BRIP1, CASR, CDC73, CDH1, CDK4, CDKN1B, CDKN1C, CDKN2A (p14ARF), CDKN2A (p16INK4a), CEBPA, CHEK2, CTNNA1, DICER1, DIS3L2, EGFR (c.2369C>T, p.Thr790Met variant only), EPCAM (Deletion/duplication testing only), FH, FLCN, GATA2, GPC3, GREM1 (Promoter region deletion/duplication testing only), HOXB13 (c.251G>A, p.Gly84Glu), HRAS, KIT, MAX, MEN1, MET, MITF (c.952G>A, p.Glu318Lys variant only), MLH1, MSH2, MSH3, MSH6, MUTYH, NBN, NF1, NF2, NTHL1, PALB2, PDGFRA, PHOX2B, PMS2, POLD1, POLE, POT1, PRKAR1A, PTCH1, PTEN, RAD50, RAD51C, RAD51D, RB1, RECQL4, RET, RUNX1, SDHAF2, SDHA (sequence changes only), SDHB, SDHC, SDHD, SMAD4, SMARCA4, SMARCB1, SMARCE1, STK11, SUFU, TERC, TERT, TMEM127, TP53, TSC1, TSC2, VHL, WRN and WT1.  The report date is 12/21/2018.   Lung cancer ([place])  12/15/2018 Initial Diagnosis   Lung cancer ([place])   12/21/2018 Genetic Testing   Negative genetic testing on the multicancer panel.  The Multi-Gene Panel offered by [facility] includes sequencing and/or deletion duplication testing of the following 84 genes: AIP, ALK, APC, ATM,  AXIN2,BAP1,  BARD1, BLM, BMPR1A, BRCA1, BRCA2, BRIP1, CASR, CDC73, CDH1, CDK4, CDKN1B, CDKN1C, CDKN2A (p14ARF), CDKN2A (p16INK4a), CEBPA, CHEK2, CTNNA1, DICER1, DIS3L2, EGFR (c.2369C>T, p.Thr790Met variant only), EPCAM (Deletion/duplication testing only), FH, FLCN, GATA2, GPC3, GREM1 (Promoter region deletion/duplication testing only), HOXB13 (c.251G>A, p.Gly84Glu), HRAS, KIT, MAX, MEN1, MET, MITF (c.952G>A, p.Glu318Lys variant only), MLH1, MSH2, MSH3, MSH6, MUTYH, NBN, NF1, NF2, NTHL1, PALB2, PDGFRA, PHOX2B, PMS2, POLD1, POLE, POT1, PRKAR1A, PTCH1, PTEN, RAD50, RAD51C, RAD51D, RB1, RECQL4, RET, RUNX1, SDHAF2, SDHA (sequence changes only), SDHB, SDHC, SDHD, SMAD4, SMARCA4, SMARCB1, SMARCE1, STK11, SUFU, TERC, TERT, TMEM127, TP53, TSC1, TSC2, VHL, WRN and WT1.  The report date is 12/21/2018.   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  The Multi-Gene Panel offered by [facility] includes sequencing and/or deletion duplication testing of the following 84 genes: AIP, ALK, APC, ATM, AXIN2,BAP1,  BARD1, BLM, BMPR1A, BRCA1, BRCA2, BRIP1, CASR, CDC73, CDH1, CDK4, CDKN1B, CDKN1C, CDKN2A (p14ARF), CDKN2A (p16INK4a), CEBPA, CHEK2, CTNNA1, DICER1, DIS3L2, EGFR (c.2369C>T, p.Thr790Met variant only), EPCAM (Deletion/duplication testing only), FH, FLCN, GATA2, GPC3, GREM1 (Promoter region deletion/duplication testing only), HOXB13 (c.251G>A, p.Gly84Glu), HRAS, KIT, MAX, MEN1, MET, MITF (c.952G>A, p.Glu318Lys variant only), MLH1, MSH2, MSH3, MSH6, MUTYH, NBN, NF1, NF2, NTHL1, PALB2, PDGFRA, PHOX2B, PMS2, POLD1, POLE, POT1, PRKAR1A, PTCH1, PTEN, RAD50, RAD51C, RAD51D, RB1, RECQL4, RET, RUNX1, SDHAF2, SDHA (sequence changes only), SDHB, SDHC, SDHD, SMAD4, SMARCA4, SMARCB1, SMARCE1, STK11, SUFU, TERC, TERT, TMEM127, TP53, TSC1, TSC2, VHL, WRN and WT1.  The report date is  12/21/2018.   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[place])  01/12/2019 Initial Diagnosis   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  [place])   01/20/2019 -  Chemotherapy   The patient had palonosetron (ALOXI) injection 0.25 mg, 0.25 mg, Intravenous,  Once, 4 of 4 cycles Administration: 0.25 mg (01/20/2019), 0.25 mg (02/10/2019), 0.25 mg (03/02/2019), 0.25 mg (03/23/2019) pegfilgrastim-cbqv (UDENYCA) injection 6 mg, 6 mg, Subcutaneous, Once, 4 of 4 cycles Administration: 6 mg (01/23/2019), 6 mg (02/13/2019), 6 mg (03/05/2019), 6 mg (03/26/2019) CARBOplatin (PARAPLATIN) 500 mg in sodium chloride 0.9 % 250 mL chemo infusion, 500 mg (100 % of original dose 504.5 mg), Intravenous,  Once, 4 of 4 cycles Dose modification:   (original dose 504.5 mg, Cycle 1),   (original dose 504.5 mg, Cycle 2),   (original dose 504.5 mg, Cycle 3),   (original dose 504.5 mg, Cycle 4) Administration: 500 mg (01/20/2019), 500 mg (02/10/2019), 500 mg (03/02/2019), 500 mg (03/23/2019) etoposide (VEPESID) 200 mg in sodium chloride 0.9 % 500 mL chemo infusion, 100 mg/m2 = 200 mg, Intravenous,  Once, 4 of 4 cycles Administration: 200 mg (01/20/2019), 200 mg (01/21/2019), 200 mg (01/22/2019), 200 mg (02/10/2019), 200 mg (02/11/2019), 200 mg (02/12/2019), 200 mg (03/02/2019), 200 mg (03/03/2019), 200 mg (03/04/2019), 200 mg (03/23/2019), 200 mg (03/24/2019), 200 mg (03/25/2019)  for chemotherapy treatment.      CANCER STAGING: Cancer Staging DCIS (ductal carcinoma in situ) of breast Staging form: Breast, AJCC 7th Edition - Clinical stage from 12/01/2013: Stage 0 (Tis (DCIS), N0, cM0) - Signed by Laura Lark, MD on 12/01/2013 - Pathologic: Stage 0 (Tis (DCIS), N0, cM0) - Signed by Laura Lark, MD on 12/01/2013   INTERVAL HISTORY:  Laura Abbott, a 74 y.o. female, returns for routine follow-up of her left neuroendocrine lung cancer. Laura Abbott was last seen on 12/31/2019.   Today she reports having some intermittent shooting pain in her left breast since her surgery. Her SOB is stable.   REVIEW  OF SYSTEMS:  Review of Systems  Constitutional: Positive for appetite change (moderately decreased) and fatigue (severe).  Respiratory: Positive for shortness of breath (d/t COPD).   Neurological: Positive for headaches and numbness (hands).  All other systems reviewed and are negative.   PAST MEDICAL/SURGICAL HISTORY:  Past Medical History:  Diagnosis Date  . Anxiety   . Arthritis   . Asthma   . Breast cancer ([place])    breast, right DCIS  . COPD (chronic obstructive pulmonary disease) ([place])   . COPD exacerbation ([place]) 01/03/2014   With hypoxia  . Depression   . Emphysema of lung ([place])   . Family history of breast cancer   . Family history of colon cancer   . Family history of kidney cancer   . Family history of lung cancer   . Family history of pancreatic cancer   . GERD (gastroesophageal reflux disease)   . History of kidney stones   . Hyperlipidemia   . Hypertension   . Obesity 01/05/2014  . Osteoporosis   . Osteoporosis, unspecified 12/01/2013  . Pneumonia    x2  . Tobacco abuse 12/01/2013   Past Surgical History:  Procedure Laterality Date  . ABDOMINAL HYSTERECTOMY    . BLADDER SURGERY     had tacked  . BREAST LUMPECTOMY WITH NEEDLE LOCALIZATION AND AXILLARY SENTINEL LYMPH NODE BX Right 11/09/2013   Procedure: BREAST LUMPECTOMY WITH NEEDLE LOCALIZATION AND AXILLARY SENTINEL LYMPH NODE BIOPSY;  Surgeon: Harl Bowie, MD;  Location: [facility];  Service: General;  Laterality: Right;  . BREAST SURGERY Right    lumpectomy  . CHOLECYSTECTOMY    . NODE  DISSECTION Left 12/15/2018   Procedure: NODE DISSECTION LEFT LUNG;  Surgeon: Melrose Nakayama, MD;  Location: [facility];  Service: Thoracic;  Laterality: Left;  . PORTACATH PLACEMENT Right 01/19/2019   Procedure: INSERTION PORT-A-CATH;  Surgeon: Aviva Signs, MD;  Location: [facility];  Service: General;  Laterality: Right;  Marland Kitchen VIDEO ASSISTED THORACOSCOPY (VATS)/ LOBECTOMY Left 12/15/2018   Procedure: VIDEO ASSISTED THORACOSCOPY  (VATS)/LEFT UPPER LOBECTOMY;  Surgeon: Melrose Nakayama, MD;  [facility];  Service: Thoracic;  Laterality: Left;    SOCIAL HISTORY:  Social History   Socioeconomic History  . Marital status: Married    Spouse name: Not on file  . Number of children: 3  . Years of education: Not on file  . Highest education level: Not on file  Occupational History  . Occupation: Database administrator    Comment: [employer]    Comment: [place]    Comment: [employer]  Tobacco Use  . Smoking status: Former Smoker    Packs/day: 0.50    Years: 43.00    Pack years: 21.50    Types: Cigarettes    Quit date: 10/24/2018    Years since quitting: 1.7  . Smokeless tobacco: Never Used  Substance and Sexual Activity  . Alcohol use: No  . Drug use: No  . Sexual activity: Not Currently  Other Topics Concern  . Not on file  Social History Narrative  . Not on file   Social Determinants of Health   Financial Resource Strain:   . Difficulty of Paying Living Expenses:   Food Insecurity:   . Worried About Charity fundraiser in the Last Year:   . Arboriculturist in the Last Year:   Transportation Needs:   . Film/video editor (Medical):   Marland Kitchen Lack of Transportation (Non-Medical):   Physical Activity:   . Days of Exercise per Week:   . Minutes of Exercise per Session:   Stress:   . Feeling of Stress :   Social Connections:   . Frequency of Communication with Friends and Family:   . Frequency of Social Gatherings with Friends and Family:   . Attends Religious Services:   . Active Member of Clubs or Organizations:   . Attends Archivist Meetings:   Marland Kitchen Marital Status:   Intimate Partner Violence:   . Fear of Current or Ex-Partner:   . Emotionally Abused:   Marland Kitchen Physically Abused:   . Sexually Abused:     FAMILY HISTORY:  Family History  Problem Relation Age of Onset  . Lung cancer Mother 31       non smoker  . Pancreatic cancer Father 54       d. 51  . Leukemia Sister          dx in her 65s  . Heart disease Sister   . Colon cancer Sister        dx and died in her 47s  . Heart disease Sister   . Lung cancer Maternal Aunt        non-smoker  . Heart disease Sister   . Breast cancer Sister        dx early 68s  . Heart disease Brother   . Cirrhosis Brother        d. 30  . Aneurysm Brother        d. 14s  . Fibromyalgia Daughter   . Kidney cancer Nephew 71  . Kidney cancer Nephew  dx in 91s; great nephew  . Leukemia Nephew   . Liver disease Nephew   . Other Niece        white matter on brain; dx late 58s    CURRENT MEDICATIONS:  Current Outpatient Medications  Medication Sig Dispense Refill  . albuterol (PROVENTIL HFA;VENTOLIN HFA) 108 (90 BASE) MCG/ACT inhaler Inhale 2 puffs into the lungs 3 (three) times daily. Also use the inhaler every 2 hours if needed for worsening shortness of breath and wheezing. (Patient not taking: Reported on 12/31/2019) 1 Inhaler 3  . Calcium 600-200 MG-UNIT per tablet Take 1 tablet by mouth daily.    . clonazePAM (KLONOPIN) 0.5 MG tablet Take 0.5 mg by mouth at bedtime.     Marland Kitchen FLUoxetine (PROZAC) 40 MG capsule Take 40 mg by mouth daily.    . fluticasone (FLONASE) 50 MCG/ACT nasal spray Place 2 sprays into both nostrils daily.    Marland Kitchen lisinopril (PRINIVIL,ZESTRIL) 10 MG tablet Take 10 mg by mouth daily.    . metoprolol (LOPRESSOR) 50 MG tablet Take 50-75 mg by mouth 2 (two) times daily. Takes 75 mg in the morning and 50 mg in the evening    . nabumetone (RELAFEN) 500 MG tablet Take 500 mg by mouth every morning.    Marland Kitchen omeprazole (PRILOSEC) 40 MG capsule Take 40 mg by mouth daily.     Marland Kitchen oxyCODONE (OXY IR/ROXICODONE) 5 MG immediate release tablet Take 1 tablet (5 mg total) by mouth every 6 (six) hours as needed for severe pain. 20 tablet 0  . prochlorperazine (COMPAZINE) 10 MG tablet Take 1 tablet (10 mg total) by mouth every 6 (six) hours as needed for nausea or vomiting. (Patient not taking: Reported on 04/22/2019) 30 tablet  2  . traZODone (DESYREL) 50 MG tablet Take 50 mg by mouth at bedtime.     No current facility-administered medications for this visit.   Facility-Administered Medications Ordered in Other Visits  Medication Dose Route Frequency Provider Last Rate Last Admin  . sodium chloride flush (NS) 0.9 % injection 10 mL  10 mL Intracatheter PRN Derek Jack, MD   10 mL at 02/10/19 1513    ALLERGIES:  No Known Allergies  PHYSICAL EXAM:  Performance status (ECOG): 1 - Symptomatic but completely ambulatory  Vitals:   07/07/20 1142  BP: (!) 156/63  Pulse: 66  Resp: 20  Temp: (!) 97.5 F (36.4 C)  SpO2: 93%   Wt Readings from Last 3 Encounters:  07/07/20 207 lb 1.6 oz (93.9 kg)  12/31/19 208 lb 14.4 oz (94.8 kg)  08/27/19 205 lb (93 kg)   Physical Exam Vitals reviewed.  Constitutional:      Appearance: Normal appearance. She is obese.  Cardiovascular:     Rate and Rhythm: Normal rate and regular rhythm.     Pulses: Normal pulses.     Heart sounds: Normal heart sounds.  Pulmonary:     Effort: Pulmonary effort is normal.     Breath sounds: Normal breath sounds.  Chest:     Breasts:        Left: No tenderness.     Comments: Port-a-Cath on R chest Neurological:     General: No focal deficit present.     Mental Status: She is alert and oriented to person, place, and time.  Psychiatric:        Mood and Affect: Mood normal.        Behavior: Behavior normal.      LABORATORY DATA:  I  have reviewed the labs as listed.  CBC Latest Ref Rng & Units 07/01/2020 12/28/2019 08/25/2019  WBC 4.0 - 10.5 K/uL 7.9 7.2 7.1  Hemoglobin 12.0 - 15.0 g/dL 13.0 12.5 11.7(L)  Hematocrit 36 - 46 % 43.2 41.2 39.5  Platelets 150 - 400 K/uL 278 268 276   CMP Latest Ref Rng & Units 07/01/2020 12/28/2019 08/25/2019  Glucose 70 - 99 mg/dL 115(H) 120(H) 121(H)  BUN 8 - 23 mg/dL '12 13 15  ' Creatinine 0.44 - 1.00 mg/dL 0.51 0.60 0.62  Sodium 135 - 145 mmol/L 140 140 139  Potassium 3.5 - 5.1 mmol/L 4.2 4.2  4.3  Chloride 98 - 111 mmol/L 101 101 101  CO2 22 - 32 mmol/L '29 30 28  ' Calcium 8.9 - 10.3 mg/dL 9.1 9.3 9.2  Total Protein 6.5 - 8.1 g/dL 7.3 7.3 7.6  Total Bilirubin 0.3 - 1.2 mg/dL 0.7 0.7 1.0  Alkaline Phos 38 - 126 U/L 80 79 77  AST 15 - 41 U/L '24 23 21  ' ALT 0 - 44 U/L '20 21 18    ' DIAGNOSTIC IMAGING:  I have independently reviewed the scans and discussed with the patient. CT Chest W Contrast  Result Date: 07/01/2020 CLINICAL DATA:  Stage II A left upper lobe neuroendocrine carcinoma post surgery and chemotherapy. EXAM: CT CHEST WITH CONTRAST TECHNIQUE: Multidetector CT imaging of the chest was performed during intravenous contrast administration. CONTRAST:  92m OMNIPAQUE IOHEXOL 300 MG/ML  SOLN COMPARISON:  CT 12/28/2019 and 08/25/2019. FINDINGS: Cardiovascular: Atherosclerosis of the aorta, great vessels and coronary arteries. Right subclavian Port-A-Cath tip extends to the mid SVC. No acute vascular findings. The heart size is normal. There is no pericardial effusion. Mediastinum/Nodes: There are no enlarged mediastinal, hilar or axillary lymph nodes. The thyroid gland, trachea and esophagus demonstrate no significant findings. Lungs/Pleura: No pleural effusion or pneumothorax. There are stable postsurgical changes from previous left upper lobe resection. Scattered linear scarring and nodularity in both lungs is unchanged. There are no new or enlarging pulmonary nodules. Upper abdomen: The visualized upper abdomen appears stable without significant findings. Musculoskeletal/Chest wall: There is no chest wall mass or suspicious osseous finding. Stable degenerative changes in the spine. Stable calcifications centrally in the right breast. IMPRESSION: 1. Stable postsurgical changes from previous left upper lobe resection. No evidence of local recurrence or metastatic disease. 2. Stable scattered linear scarring and nodularity in both lungs. 3. Coronary and aortic Atherosclerosis (ICD10-I70.0).  Electronically Signed   By: WRichardean SaleM.D.   On: 07/01/2020 15:06     ASSESSMENT:  1.  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  She was found to have an abnormal chest x-ray followed by CT scan which showed left upper lobe lung mass. - She smoked 1 to 2 packs of cigarettes per day for the last 50 years.  She quit smoking on 10/23/2018. - Denies any significant weight loss.  But has night sweats occasionally.  Denies any hemoptysis. - PET CT scan on 11/17/2018 showed left upper lobe lung mass, 4.9 cm, no evidence of adenopathy or metastasis.  MRI of the brain was negative for intracranial mets. - Left upper lobectomy and lymph node biopsy on 12/15/2018.  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   -Cancer type ID testing shows 96% probability of neuroendocrine carcinoma (small cell/large cell  carcinoma). -4 cycles of adjuvant chemotherapy with carboplatin and etoposide from 01/20/2019 through 03/23/2019. -CT of the chest on 08/25/2019 showed left upper lobectomy changes with tiny pulmonary nodules unchanged. -We reviewed results of the CT of the chest dated 12/28/2019 which showed stable postsurgical changes with improved left basilar aeration.  No evidence of recurrence or metastatic disease.   2.  Family history: -Mother had lung cancer and was never smoker.  Father had pancreatic cancer. -1 sister has colon cancer in 1 sister with leukemia.  Another sister had breast cancer. -[facility] testing on 12/09/2018 was negative for hereditary malignancies.   PLAN:  1.  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 -Stable scattered scarring and nodularity in both lungs.  I have reviewed her LFTs and CBC which are within normal limits.  Calcium is also normal. -I have recommended follow-up in 6 months with repeat CT scan.    Orders placed this encounter:  No orders of the defined types were placed in this encounter.    Derek Jack, MD [place] [phone]   I, Milinda Antis, am acting as a scribe for Dr. Sanda Linger.  I, Derek Jack MD, have reviewed the above documentation for accuracy and completeness, and I agree with the above.

## 2020-07-07 NOTE — Patient Instructions (Signed)
[place] at [facility] Discharge Instructions  You were seen today by Dr. Delton Coombes. He went over your recent results. You will be scheduled for a CT scan of your chest. Dr. Delton Coombes will see you back in 6 months for labs and follow up.   Thank you for choosing [place] at [facility] to provide your oncology and hematology care.  To afford each patient quality time with our provider, please arrive at least 15 minutes before your scheduled appointment time.   If you have a lab appointment with the [facility] please come in thru the Main Entrance and check in at the main information desk  You need to re-schedule your appointment should you arrive 10 or more minutes late.  We strive to give you quality time with our providers, and arriving late affects you and other patients whose appointments are after yours.  Also, if you no show three or more times for appointments you may be dismissed from the clinic at the providers discretion.     Again, thank you for choosing [facility].  Our hope is that these requests will decrease the amount of time that you wait before being seen by our physicians.       _____________________________________________________________  Should you have questions after your visit to [facility], please contact our office at [phone] between the hours of 8:00 a.m. and 4:30 p.m.  Voicemails left after 4:00 p.m. will not be returned until the following business day.  For prescription refill requests, have your pharmacy contact our office and allow 72 hours.    Cancer Center Support Programs:   > Cancer Support Group  2nd Tuesday of the month 1pm-2pm, Journey Room

## 2020-08-26 DIAGNOSIS — Z299 Encounter for prophylactic measures, unspecified: Secondary | ICD-10-CM | POA: Diagnosis not present

## 2020-08-26 DIAGNOSIS — J449 Chronic obstructive pulmonary disease, unspecified: Secondary | ICD-10-CM | POA: Diagnosis not present

## 2020-08-26 DIAGNOSIS — G8929 Other chronic pain: Secondary | ICD-10-CM | POA: Diagnosis not present

## 2020-08-26 DIAGNOSIS — Z6841 Body Mass Index (BMI) 40.0 and over, adult: Secondary | ICD-10-CM | POA: Diagnosis not present

## 2020-08-26 DIAGNOSIS — R609 Edema, unspecified: Secondary | ICD-10-CM | POA: Diagnosis not present

## 2020-08-26 DIAGNOSIS — I10 Essential (primary) hypertension: Secondary | ICD-10-CM | POA: Diagnosis not present

## 2020-08-26 DIAGNOSIS — M549 Dorsalgia, unspecified: Secondary | ICD-10-CM | POA: Diagnosis not present

## 2020-09-06 DIAGNOSIS — Z0000 Encounter for general adult medical examination without abnormal findings: Secondary | ICD-10-CM | POA: Diagnosis not present

## 2020-09-06 DIAGNOSIS — Z1211 Encounter for screening for malignant neoplasm of colon: Secondary | ICD-10-CM | POA: Diagnosis not present

## 2020-09-06 DIAGNOSIS — I10 Essential (primary) hypertension: Secondary | ICD-10-CM | POA: Diagnosis not present

## 2020-09-06 DIAGNOSIS — Z79899 Other long term (current) drug therapy: Secondary | ICD-10-CM | POA: Diagnosis not present

## 2020-09-06 DIAGNOSIS — Z1331 Encounter for screening for depression: Secondary | ICD-10-CM | POA: Diagnosis not present

## 2020-09-06 DIAGNOSIS — Z299 Encounter for prophylactic measures, unspecified: Secondary | ICD-10-CM | POA: Diagnosis not present

## 2020-09-06 DIAGNOSIS — E7800 Pure hypercholesterolemia, unspecified: Secondary | ICD-10-CM | POA: Diagnosis not present

## 2020-09-06 DIAGNOSIS — Z7189 Other specified counseling: Secondary | ICD-10-CM | POA: Diagnosis not present

## 2020-09-06 DIAGNOSIS — Z1339 Encounter for screening examination for other mental health and behavioral disorders: Secondary | ICD-10-CM | POA: Diagnosis not present

## 2020-09-06 DIAGNOSIS — R5383 Other fatigue: Secondary | ICD-10-CM | POA: Diagnosis not present

## 2020-09-06 DIAGNOSIS — Z6839 Body mass index (BMI) 39.0-39.9, adult: Secondary | ICD-10-CM | POA: Diagnosis not present

## 2020-09-19 DIAGNOSIS — M545 Low back pain, unspecified: Secondary | ICD-10-CM | POA: Diagnosis not present

## 2020-09-19 DIAGNOSIS — M47816 Spondylosis without myelopathy or radiculopathy, lumbar region: Secondary | ICD-10-CM | POA: Diagnosis not present

## 2020-09-20 DIAGNOSIS — N959 Unspecified menopausal and perimenopausal disorder: Secondary | ICD-10-CM | POA: Diagnosis not present

## 2020-09-20 DIAGNOSIS — E2839 Other primary ovarian failure: Secondary | ICD-10-CM | POA: Diagnosis not present

## 2020-09-20 DIAGNOSIS — M859 Disorder of bone density and structure, unspecified: Secondary | ICD-10-CM | POA: Diagnosis not present

## 2020-09-27 DIAGNOSIS — M549 Dorsalgia, unspecified: Secondary | ICD-10-CM | POA: Diagnosis not present

## 2020-09-27 DIAGNOSIS — J440 Chronic obstructive pulmonary disease with acute lower respiratory infection: Secondary | ICD-10-CM | POA: Diagnosis not present

## 2020-09-27 DIAGNOSIS — F32A Depression, unspecified: Secondary | ICD-10-CM | POA: Diagnosis not present

## 2020-09-27 DIAGNOSIS — G8929 Other chronic pain: Secondary | ICD-10-CM | POA: Diagnosis not present

## 2020-09-27 DIAGNOSIS — Z6836 Body mass index (BMI) 36.0-36.9, adult: Secondary | ICD-10-CM | POA: Diagnosis not present

## 2020-09-27 DIAGNOSIS — Z20828 Contact with and (suspected) exposure to other viral communicable diseases: Secondary | ICD-10-CM | POA: Diagnosis not present

## 2020-09-27 DIAGNOSIS — Z85118 Personal history of other malignant neoplasm of bronchus and lung: Secondary | ICD-10-CM | POA: Diagnosis not present

## 2020-09-27 DIAGNOSIS — G5620 Lesion of ulnar nerve, unspecified upper limb: Secondary | ICD-10-CM | POA: Diagnosis not present

## 2020-10-07 ENCOUNTER — Inpatient Hospital Stay (HOSPITAL_COMMUNITY): Payer: Medicare HMO | Attending: Hematology

## 2020-10-27 DIAGNOSIS — F32A Depression, unspecified: Secondary | ICD-10-CM | POA: Diagnosis not present

## 2020-10-27 DIAGNOSIS — J440 Chronic obstructive pulmonary disease with acute lower respiratory infection: Secondary | ICD-10-CM | POA: Diagnosis not present

## 2020-10-27 DIAGNOSIS — J209 Acute bronchitis, unspecified: Secondary | ICD-10-CM | POA: Diagnosis not present

## 2020-10-27 DIAGNOSIS — Z85118 Personal history of other malignant neoplasm of bronchus and lung: Secondary | ICD-10-CM | POA: Diagnosis not present

## 2020-10-27 DIAGNOSIS — G5620 Lesion of ulnar nerve, unspecified upper limb: Secondary | ICD-10-CM | POA: Diagnosis not present

## 2020-10-27 DIAGNOSIS — Z0001 Encounter for general adult medical examination with abnormal findings: Secondary | ICD-10-CM | POA: Diagnosis not present

## 2020-10-27 DIAGNOSIS — G8929 Other chronic pain: Secondary | ICD-10-CM | POA: Diagnosis not present

## 2020-10-27 DIAGNOSIS — Z23 Encounter for immunization: Secondary | ICD-10-CM | POA: Diagnosis not present

## 2020-10-27 DIAGNOSIS — J449 Chronic obstructive pulmonary disease, unspecified: Secondary | ICD-10-CM | POA: Diagnosis not present

## 2020-10-31 ENCOUNTER — Encounter: Payer: Self-pay | Admitting: *Deleted

## 2020-11-01 ENCOUNTER — Other Ambulatory Visit: Payer: Self-pay

## 2020-11-01 ENCOUNTER — Encounter: Payer: Self-pay | Admitting: Diagnostic Neuroimaging

## 2020-11-01 ENCOUNTER — Ambulatory Visit: Payer: Medicare HMO | Admitting: Diagnostic Neuroimaging

## 2020-11-01 VITALS — BP 154/65 | HR 67 | Ht 63.0 in | Wt 209.0 lb

## 2020-11-01 DIAGNOSIS — M542 Cervicalgia: Secondary | ICD-10-CM | POA: Diagnosis not present

## 2020-11-01 DIAGNOSIS — R200 Anesthesia of skin: Secondary | ICD-10-CM | POA: Diagnosis not present

## 2020-11-01 NOTE — Patient Instructions (Signed)
-   check EMG/NCS and MRI cervical spine

## 2020-11-01 NOTE — Progress Notes (Signed)
[facility]  PATIENT: Laura Abbott DOB: January 22, 1946  REFERRING CLINICIAN: Ryderwood Nation, MD HISTORY FROM: patient  REASON FOR VISIT: new consult    HISTORICAL  CHIEF COMPLAINT:  Chief Complaint  Patient presents with  . L Ulnar Nerve Pain    rm 6 New Pt grand dgtr- Hannah "numbness in left 2 fingers; pain in neck"    HISTORY OF PRESENT ILLNESS:   74 year old female here for evaluation of left hand numbness.  Patient reports numbness in left hand, digits 4 and 5 for about 1 year.  Also has some neck pain for the last 3 months.  Has some right hand numbness that is also been going on for several years.  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  Patient had left upper lobectomy and lymph node biopsy in 2020.  Patient was treated with chemotherapy and follow-up scans showed stable postsurgical changes.  Patient not sure if left hand numbness started around that time or after.   REVIEW OF SYSTEMS: Full 14 system review of systems performed and negative with exception of: As per HPI.  ALLERGIES: No Known Allergies  HOME MEDICATIONS: Outpatient Medications Prior to Visit  Medication Sig Dispense Refill  . Calcium 600-200 MG-UNIT per tablet Take 1 tablet by mouth daily.    . clonazePAM (KLONOPIN) 0.5 MG tablet Take 0.5 mg by mouth at bedtime.     Marland Kitchen FLUoxetine (PROZAC) 40 MG capsule Take 40 mg by mouth daily.    . furosemide (LASIX) 20 MG tablet as needed.    . metoprolol (LOPRESSOR) 50 MG tablet Take 50-75 mg by mouth 2 (two) times daily. Takes 75 mg in the morning and 50 mg in the evening    . nabumetone (RELAFEN) 500 MG tablet Take 500 mg by mouth every morning.    Marland Kitchen omeprazole (PRILOSEC) 40 MG capsule Take 40 mg by mouth daily.     Marland Kitchen SPIRIVA HANDIHALER 18 MCG inhalation capsule     . tiZANidine (ZANAFLEX) 4 MG tablet     . traZODone (DESYREL) 50  MG tablet Take 50 mg by mouth at bedtime.    Marland Kitchen albuterol (PROVENTIL HFA;VENTOLIN HFA) 108 (90 BASE) MCG/ACT inhaler Inhale 2 puffs into the lungs 3 (three) times daily. Also use the inhaler every 2 hours if needed for worsening shortness of breath and wheezing. (Patient not taking: Reported on 07/07/2020) 1 Inhaler 3  . fluticasone (FLONASE) 50 MCG/ACT nasal spray Place 2 sprays into both nostrils daily. (Patient not taking: Reported on 11/01/2020)    . lisinopril (PRINIVIL,ZESTRIL) 10 MG tablet Take 10 mg by mouth daily. (Patient not taking: Reported on 11/01/2020)    . oxyCODONE (OXY IR/ROXICODONE) 5 MG immediate release tablet Take 1 tablet (5 mg total) by mouth every 6 (six) hours as needed for severe pain. (Patient not taking: Reported on 07/07/2020) 20 tablet 0  . prochlorperazine (COMPAZINE) 10 MG tablet Take 1 tablet (10 mg total) by mouth every 6 (six) hours as needed for nausea or vomiting. (Patient not taking: Reported on 07/07/2020) 30 tablet 2   Facility-Administered Medications Prior to Visit  Medication Dose Route Frequency Provider Last Rate Last Admin  . sodium chloride flush (NS) 0.9 % injection 10 mL  10 mL Intracatheter PRN Derek Jack, MD   10 mL at 02/10/19 1513    PAST MEDICAL HISTORY: Past Medical History:  Diagnosis Date  .  Anxiety   . Arthritis   . Asthma   . 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 COPD (chronic obstructive pulmonary disease) ([place])   . COPD exacerbation ([place]) 01/03/2014   With hypoxia  . Depression   . Emphysema of lung ([place])   . Family history of breast cancer   . Family history of colon cancer   . Family history of kidney cancer   . Family history of lung cancer   . Family history of pancreatic cancer   . GERD (gastroesophageal reflux disease)   . History of kidney stones   . Hyperlipidemia   . Hypertension   . Insomnia   . Lung cancer ([place])   . Obesity 01/05/2014  . Osteoporosis   . Osteoporosis, unspecified 12/01/2013  .  Pneumonia    x2  . Tobacco abuse 12/01/2013  . Ulnar nerve impingement, left     PAST SURGICAL HISTORY: Past Surgical History:  Procedure Laterality Date  . ABDOMINAL HYSTERECTOMY    . BLADDER SURGERY     had tacked  . BREAST LUMPECTOMY WITH NEEDLE LOCALIZATION AND AXILLARY SENTINEL LYMPH NODE BX Right 11/09/2013   Procedure: BREAST LUMPECTOMY WITH NEEDLE LOCALIZATION AND AXILLARY SENTINEL LYMPH NODE BIOPSY;  Surgeon: Harl Bowie, MD;  Location: [facility];  Service: General;  Laterality: Right;  . BREAST SURGERY Right    lumpectomy  . CHOLECYSTECTOMY    . NODE DISSECTION Left 12/15/2018   Procedure: NODE DISSECTION LEFT LUNG;  Surgeon: Melrose Nakayama, MD;  Location: [facility];  Service: Thoracic;  Laterality: Left;  . PORTACATH PLACEMENT Right 01/19/2019   Procedure: INSERTION PORT-A-CATH;  Surgeon: Aviva Signs, MD;  Location: [facility];  Service: General;  Laterality: Right;  Marland Kitchen VIDEO ASSISTED THORACOSCOPY (VATS)/ LOBECTOMY Left 12/15/2018   Procedure: VIDEO ASSISTED THORACOSCOPY (VATS)/LEFT UPPER LOBECTOMY;  Surgeon: Melrose Nakayama, MD;  [facility];  Service: Thoracic;  Laterality: Left;    FAMILY HISTORY: Family History  Problem Relation Age of Onset  . Lung cancer Mother 53       non smoker  . Rheum arthritis Mother   . Pancreatic cancer Father 52       d. 78  . Leukemia Sister        dx in her 12s  . Heart disease Sister   . Colon cancer Sister        dx and died in her 10s  . Heart disease Sister   . Lung cancer Maternal Aunt        non-smoker  . Heart disease Sister   . Breast cancer Sister        dx early 74s  . Heart disease Brother   . Cirrhosis Brother        d. 79  . Aneurysm Brother        d. 39s  . Fibromyalgia Daughter   . Kidney cancer Nephew 71  . Kidney cancer Nephew        dx in 35s; great nephew  . Leukemia Nephew   . Liver disease Nephew   . Other Niece        white matter on brain; dx late 60s    SOCIAL HISTORY: Social  History   Socioeconomic History  . Marital status: Married    Spouse name: Not on file  . Number of children: 3  . Years of education: Not on file  . Highest education level: Not on file  Occupational History  .  Occupation: Database administrator    Comment: Engineering geologist, retired    Comment: Forensic psychologist    Comment: [employer]  Tobacco Use  . Smoking status: Former Smoker    Packs/day: 0.50    Years: 43.00    Pack years: 21.50    Types: Cigarettes    Quit date: 10/24/2018    Years since quitting: 2.0  . Smokeless tobacco: Never Used  Substance and Sexual Activity  . Alcohol use: Never  . Drug use: No  . Sexual activity: Not Currently  Other Topics Concern  . Not on file  Social History Narrative   Lives with husband   Caffeine-- coffee 2 c daily   Social Determinants of Health   Financial Resource Strain:   . Difficulty of Paying Living Expenses: Not on file  Food Insecurity:   . Worried About Charity fundraiser in the Last Year: Not on file  . Ran Out of Food in the Last Year: Not on file  Transportation Needs:   . Lack of Transportation (Medical): Not on file  . Lack of Transportation (Non-Medical): Not on file  Physical Activity:   . Days of Exercise per Week: Not on file  . Minutes of Exercise per Session: Not on file  Stress:   . Feeling of Stress : Not on file  Social Connections:   . Frequency of Communication with Friends and Family: Not on file  . Frequency of Social Gatherings with Friends and Family: Not on file  . Attends Religious Services: Not on file  . Active Member of Clubs or Organizations: Not on file  . Attends Archivist Meetings: Not on file  . Marital Status: Not on file  Intimate Partner Violence:   . Fear of Current or Ex-Partner: Not on file  . Emotionally Abused: Not on file  . Physically Abused: Not on file  . Sexually Abused: Not on file     PHYSICAL EXAM  GENERAL EXAM/CONSTITUTIONAL: Vitals:  Vitals:   11/01/20 1436   BP: (!) 154/65  Pulse: 67  Weight: 209 lb (94.8 kg)  Height: 5\' 3"  (1.6 m)     Body mass index is 37.02 kg/m. Wt Readings from Last 3 Encounters:  11/01/20 209 lb (94.8 kg)  07/07/20 207 lb 1.6 oz (93.9 kg)  12/31/19 208 lb 14.4 oz (94.8 kg)     Patient is in no distress; well developed, nourished and groomed; neck is supple  CARDIOVASCULAR:  Examination of carotid arteries is normal; no carotid bruits  Regular rate and rhythm, no murmurs  Examination of peripheral vascular system by observation and palpation is normal  EYES:  Ophthalmoscopic exam of optic discs and posterior segments is normal; no papilledema or hemorrhages  No exam data present  MUSCULOSKELETAL:  Gait, strength, tone, movements noted in Neurologic exam below  NEUROLOGIC: MENTAL STATUS:  No flowsheet data found.  awake, alert, oriented to person, place and time  recent and remote memory intact  normal attention and concentration  language fluent, comprehension intact, naming intact  fund of knowledge appropriate  CRANIAL NERVE:   2nd - no papilledema on fundoscopic exam  2nd, 3rd, 4th, 6th - pupils equal and reactive to light, visual fields full to confrontation, extraocular muscles intact, no nystagmus  5th - facial sensation symmetric  7th - facial strength symmetric  8th - hearing intact  9th - palate elevates symmetrically, uvula midline  11th - shoulder shrug symmetric  12th - tongue protrusion midline  MOTOR:  normal bulk and tone, full strength in the BUE, BLE; EXCCEPT MILD ATROPHY OF LEFT HAND INTRINSIC MUSLCES  SENSORY:   normal and symmetric to light touch, pinprick, temperature, vibration; EXCEPT DECR IN RIGHT HAND THROUGHTOU; SLIGHTLY DECR IN LEFT HAND DIGITS 4, 5  COORDINATION:   finger-nose-finger, fine finger movements normal  REFLEXES:   deep tendon reflexes present and symmetric; SLIGHTLY BRISK IN LUE  GAIT/STATION:   narrow based gait; SLOW  AND CAUTIOUS      DIAGNOSTIC DATA (LABS, IMAGING, TESTING) - I reviewed patient records, labs, notes, testing and imaging myself where available.  Lab Results  Component Value Date   WBC 7.9 07/01/2020   HGB 13.0 07/01/2020   HCT 43.2 07/01/2020   MCV 95.6 07/01/2020   PLT 278 07/01/2020      Component Value Date/Time   NA 140 07/01/2020 0937   K 4.2 07/01/2020 0937   CL 101 07/01/2020 0937   CO2 29 07/01/2020 0937   GLUCOSE 115 (H) 07/01/2020 0937   BUN 12 07/01/2020 0937   CREATININE 0.51 07/01/2020 0937   CALCIUM 9.1 07/01/2020 0937   PROT 7.3 07/01/2020 0937   ALBUMIN 3.8 07/01/2020 0937   AST 24 07/01/2020 0937   ALT 20 07/01/2020 0937   ALKPHOS 80 07/01/2020 0937   BILITOT 0.7 07/01/2020 0937   GFRNONAA >60 07/01/2020 0937   GFRAA >60 07/01/2020 0937   No results found for: CHOL, HDL, LDLCALC, LDLDIRECT, TRIG, CHOLHDL Lab Results  Component Value Date   HGBA1C 5.7 (H) 11/26/2016   No results found for: [id number] Lab Results  Component Value Date   TSH 0.467 01/04/2014        ASSESSMENT AND PLAN  74 y.o. 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  We will proceed with further work-up to rule out cervical radiculopathy, brachial plexopathy or peripheral neuropathy.  Dx:  1. Neck pain   2. Left arm numbness      PLAN:  - check EMG/NCS and MRI cervical spine   Orders Placed This Encounter  Procedures  . MR CERVICAL SPINE W WO CONTRAST  . NCV with EMG(electromyography)   Return for for NCV/EMG.    Penni Bombard, MD 94/05/6545, 5:03 PM Certified in Neurology, Neurophysiology and Neuroimaging  [facility] [address], [place],  [address] [phone]

## 2020-11-02 ENCOUNTER — Telehealth: Payer: Self-pay | Admitting: Diagnostic Neuroimaging

## 2020-11-02 NOTE — Telephone Encounter (Signed)
[employer] pending  

## 2020-11-02 NOTE — Telephone Encounter (Signed)
Mcarthur Rossetti Josem Kaufmann: [id number] (exp. 11/02/20 to 12/02/20) order sent to GI. They will reach out to the patient to schedule.

## 2020-11-03 DIAGNOSIS — F32A Depression, unspecified: Secondary | ICD-10-CM | POA: Diagnosis not present

## 2020-11-03 DIAGNOSIS — Z1331 Encounter for screening for depression: Secondary | ICD-10-CM | POA: Diagnosis not present

## 2020-11-03 DIAGNOSIS — J440 Chronic obstructive pulmonary disease with acute lower respiratory infection: Secondary | ICD-10-CM | POA: Diagnosis not present

## 2020-11-03 DIAGNOSIS — Z1389 Encounter for screening for other disorder: Secondary | ICD-10-CM | POA: Diagnosis not present

## 2020-11-03 DIAGNOSIS — G5620 Lesion of ulnar nerve, unspecified upper limb: Secondary | ICD-10-CM | POA: Diagnosis not present

## 2020-11-03 DIAGNOSIS — Z85118 Personal history of other malignant neoplasm of bronchus and lung: Secondary | ICD-10-CM | POA: Diagnosis not present

## 2020-11-03 DIAGNOSIS — M549 Dorsalgia, unspecified: Secondary | ICD-10-CM | POA: Diagnosis not present

## 2020-11-03 DIAGNOSIS — J209 Acute bronchitis, unspecified: Secondary | ICD-10-CM | POA: Diagnosis not present

## 2020-11-04 ENCOUNTER — Other Ambulatory Visit (HOSPITAL_COMMUNITY): Payer: Self-pay | Admitting: Internal Medicine

## 2020-11-07 ENCOUNTER — Other Ambulatory Visit (HOSPITAL_COMMUNITY): Payer: Self-pay | Admitting: Internal Medicine

## 2020-11-09 ENCOUNTER — Other Ambulatory Visit: Payer: Self-pay

## 2020-11-09 ENCOUNTER — Ambulatory Visit (HOSPITAL_COMMUNITY): Payer: Medicare HMO | Attending: Internal Medicine | Admitting: Physical Therapy

## 2020-11-09 ENCOUNTER — Encounter (HOSPITAL_COMMUNITY): Payer: Self-pay | Admitting: Physical Therapy

## 2020-11-09 DIAGNOSIS — R293 Abnormal posture: Secondary | ICD-10-CM | POA: Insufficient documentation

## 2020-11-09 DIAGNOSIS — M5412 Radiculopathy, cervical region: Secondary | ICD-10-CM | POA: Insufficient documentation

## 2020-11-09 NOTE — Therapy (Signed)
[place], [place], [address] Phone: [phone]   Fax:  [phone]  Physical Therapy Evaluation  Patient Details  Name: Laura Abbott MRN: [id number] Date of Birth: 03/26/1946 Referring Provider (PT): Stana Bunting   Encounter Date: 11/09/2020   PT End of Session - 11/09/20 1413    Visit Number 1    Number of Visits 12    Date for PT Re-Evaluation 12/21/20    Authorization Type [employer]    Authorization Time Period put in for authorization    Progress Note Due on Visit 10    PT Start Time 1320    PT Stop Time 1400    PT Time Calculation (min) 40 min    Activity Tolerance Patient tolerated treatment well           Past Medical History:  Diagnosis Date  . Anxiety   . Arthritis   . Asthma   . 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 COPD (chronic obstructive pulmonary disease) ([place])   . COPD exacerbation ([place]) 01/03/2014   With hypoxia  . Depression   . Emphysema of lung ([place])   . Family history of breast cancer   . Family history of colon cancer   . Family history of kidney cancer   . Family history of lung cancer   . Family history of pancreatic cancer   . GERD (gastroesophageal reflux disease)   . History of kidney stones   . Hyperlipidemia   . Hypertension   . Insomnia   . Lung cancer ([place])   . Obesity 01/05/2014  . Osteoporosis   . Osteoporosis, unspecified 12/01/2013  . Pneumonia    x2  . Tobacco abuse 12/01/2013  . Ulnar nerve impingement, left     Past Surgical History:  Procedure Laterality Date  . ABDOMINAL HYSTERECTOMY    . BLADDER SURGERY     had tacked  . BREAST LUMPECTOMY WITH NEEDLE LOCALIZATION AND AXILLARY SENTINEL LYMPH NODE BX Right 11/09/2013   Procedure: BREAST LUMPECTOMY WITH NEEDLE LOCALIZATION AND AXILLARY SENTINEL LYMPH NODE BIOPSY;  Surgeon: Harl Bowie, MD;  Location: [facility];  Service: General;  Laterality: Right;  . BREAST SURGERY Right    lumpectomy  .  CHOLECYSTECTOMY    . NODE DISSECTION Left 12/15/2018   Procedure: NODE DISSECTION LEFT LUNG;  Surgeon: Melrose Nakayama, MD;  Location: [facility];  Service: Thoracic;  Laterality: Left;  . PORTACATH PLACEMENT Right 01/19/2019   Procedure: INSERTION PORT-A-CATH;  Surgeon: Aviva Signs, MD;  [facility];  Service: General;  Laterality: Right;  Marland Kitchen VIDEO ASSISTED THORACOSCOPY (VATS)/ LOBECTOMY Left 12/15/2018   Procedure: VIDEO ASSISTED THORACOSCOPY (VATS)/LEFT UPPER LOBECTOMY;  Surgeon: Melrose Nakayama, MD;  Location: [facility];  Service: Thoracic;  Laterality: Left;    There were no vitals filed for this visit.    Subjective Assessment - 11/09/20 1323    Subjective Laura Abbott states that she has been having pain for several months in her neck, the pain is on the left.  She has been having tingling in her Lt hand for years.  The pain is more pronounced when she turns her head to the left.    Pertinent History OA, headaches everyday,    Patient Stated Goals decreased headaches  and pain , improved headaches and improved motion    Currently in Pain? Yes    Pain Score 5     Pain Location Neck  Pain Orientation Left    Pain Descriptors / Indicators Sore    Pain Type Chronic pain    Pain Radiating Towards hand    Pain Onset More than a month ago    Pain Frequency Constant    Aggravating Factors  turning her head    Pain Relieving Factors heat    Effect of Pain on Daily Activities just carries on              [facility] PT Assessment - 11/09/20 0001      Assessment   Medical Diagnosis Cervical pain    Referring Provider (PT) Stana Bunting    Next MD Visit 01/02/2021    Prior Therapy none      Precautions   Precautions None      Restrictions   Weight Bearing Restrictions No      Balance Screen   Has the patient fallen in the past 6 months No      Prior Function   Level of Independence Independent with community mobility with device    Leisure read,games on computer       Cognition   Overall Cognitive Status Within Functional Limits for tasks assessed      Observation/Other Assessments   Focus on Therapeutic Outcomes (FOTO)  46; 54% affected      Posture/Postural Control   Posture/Postural Control Postural limitations    Postural Limitations Forward head;Increased thoracic kyphosis      ROM / Strength   AROM / PROM / Strength AROM;Strength      AROM   AROM Assessment Site Cervical    Cervical Flexion 42   reps increase pain   Cervical Extension 45   reps increase pain   Cervical - Right Side Bend 30   reps increase pain   Cervical - Left Side Bend 25   reps increase pain   Cervical - Right Rotation 58    Cervical - Left Rotation 60      Strength   Strength Assessment Site Cervical    Cervical Extension 3-/5    Cervical - Right Side Bend 3-/5    Cervical - Left Side Bend 3-/5                      Objective measurements completed on examination: See above findings.       [facility] Adult PT Treatment/Exercise - 11/09/20 0001      Exercises   Exercises Neck      Neck Exercises: Seated   Neck Retraction 10 reps    Neck Retraction Limitations and cervical retraction x 10    Postural Training x 5                  PT Education - 11/09/20 1411    Education Details HEP    Person(s) Educated Patient    Methods Explanation;Demonstration;Handout    Comprehension Verbalized understanding;Returned demonstration            PT Short Term Goals - 11/09/20 1423      PT SHORT TERM GOAL #1   Title PT to be I in her HEP to decrease her cervical pain down to no greater than a 5/10 in the past week    Time 3    Period Weeks    Status New    Target Date 11/30/20      PT SHORT TERM GOAL #2   Title PT cervical strength to be at least 4-/5 to allow headaches  to be no more than 3 x a week    Time 3    Period Weeks    Status New      PT SHORT TERM GOAL #3   Title PT to be able to verbalize the importance of proper posture in  her spinal health.    Time 3    Period Weeks    Status New             PT Long Term Goals - 11/09/20 1425      PT LONG TERM GOAL #1   Title PT to be I in advance HEP in order to decrease her cervical pain to no greater than a 2/10    Time 6    Period Weeks    Status New    Target Date 12/21/20      PT LONG TERM GOAL #2   Title Pt cervical spine strength to be at least 4+/5 to allow no headaches in the past month    Time 6    Period Weeks    Status New      PT LONG TERM GOAL #3   Title Pt cervical rotation to have increased by 5 degrees both directions in order to allow safer driving    Time 6    Period Weeks    Status New                  Plan - 11/09/20 1414    Clinical Impression Statement Laura Abbott is a 74 yo female who has been having numbness in her Lt hand for years; in the past month she developed Lt cervical pain which is aggrevated by any movement.  She has currently been referred to skilled PT for her cervical pain.  Evaluation demonstrates decreased ROM, decreased strength, postural changes, increased pain and increased mm spasms.  Laura Abbott will benefit from skilled PT to address these issues and assist in decreasing her pain.    Personal Factors and Comorbidities Comorbidity 3+;Fitness;Social Background;Time since onset of injury/illness/exacerbation    Comorbidities OA, osteroporosis, hx Cancer,    Examination-Activity Limitations Bed Mobility;Carry;Lift;Other    Examination-Participation Restrictions Cleaning;Driving;Laundry;Shop    Stability/Clinical Decision Making Evolving/Moderate complexity    Clinical Decision Making Moderate    Rehab Potential Fair    PT Frequency 2x / week    PT Duration 6 weeks    PT Treatment/Interventions Manual techniques;Patient/family education;Therapeutic activities;Therapeutic exercise;Passive range of motion    PT Next Visit Plan begin manual; decompression exercises 1-5; cervical rotation begin supine; progress to  isometric exercises and t band decompression exercises as well as cervical stabilization    PT Home Exercise Plan posture; cervical and scapular retraction exercises.    Consulted and Agree with Plan of Care Patient           Patient will benefit from skilled therapeutic intervention in order to improve the following deficits and impairments:  Decreased activity tolerance,Decreased range of motion,Decreased strength,Increased fascial restricitons,Increased muscle spasms,Postural dysfunction,Pain,Impaired UE functional use  Visit Diagnosis: Radiculopathy, cervical region - Plan: PT plan of care cert/re-cert  Abnormal posture - Plan: PT plan of care cert/re-cert     Problem List Patient Active Problem List   Diagnosis Date Noted  . 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 Genetic testing 12/24/2018  . 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 Lung cancer ([place]) 12/15/2018  . Family history of breast cancer   . Family history of pancreatic cancer   .  Family history of lung cancer   . Family history of kidney cancer   . Family history of colon cancer   . Lung mass 11/24/2018  . Hypokalemia 11/25/2016  . Preventive measure 10/16/2014  . Obesity 01/05/2014  . Bronchitis 01/03/2014  . COPD exacerbation ([place]) 01/03/2014  . Acute respiratory failure with hypoxia ([place]) 01/03/2014  . Hyperglycemia 01/03/2014  . Hypertension 01/03/2014  . Osteoporosis 12/01/2013  . Tobacco abuse 12/01/2013  . 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 Absence of bladder continence 12/31/2011  . FOM (frequency of micturition) 12/31/2011  . H/O urinary tract infection 12/31/2011  . Blood in the urine 12/31/2011    Rayetta Humphrey, PT CLT [phone] 11/09/2020, 2:31 PM  [facility] [address] [place], [place], [address] Phone: [phone]   Fax:  [phone]  Name: Laura Abbott MRN: [id number] Date of Birth:  05-08-1946

## 2020-11-10 ENCOUNTER — Ambulatory Visit: Payer: Medicare HMO | Admitting: Urology

## 2020-11-10 ENCOUNTER — Other Ambulatory Visit (HOSPITAL_COMMUNITY): Payer: Self-pay | Admitting: Internal Medicine

## 2020-11-10 DIAGNOSIS — M810 Age-related osteoporosis without current pathological fracture: Secondary | ICD-10-CM

## 2020-11-13 ENCOUNTER — Other Ambulatory Visit: Payer: Self-pay

## 2020-11-13 ENCOUNTER — Ambulatory Visit
Admission: RE | Admit: 2020-11-13 | Discharge: 2020-11-13 | Disposition: A | Discharge status: Home / Self Care | Payer: Medicare HMO | Source: Ambulatory Visit | Attending: Diagnostic Neuroimaging | Admitting: Diagnostic Neuroimaging

## 2020-11-13 DIAGNOSIS — M542 Cervicalgia: Secondary | ICD-10-CM

## 2020-11-13 DIAGNOSIS — R200 Anesthesia of skin: Secondary | ICD-10-CM

## 2020-11-13 MED ORDER — GADOBENATE DIMEGLUMINE 529 MG/ML IV SOLN
20.0000 mL | Freq: Once | INTRAVENOUS | Status: AC | PRN
  Administered 2020-11-13: 20 mL via INTRAVENOUS

## 2020-11-15 ENCOUNTER — Other Ambulatory Visit: Payer: Self-pay

## 2020-11-15 ENCOUNTER — Encounter (HOSPITAL_COMMUNITY): Payer: Self-pay | Admitting: Physical Therapy

## 2020-11-15 ENCOUNTER — Telehealth: Payer: Self-pay | Admitting: *Deleted

## 2020-11-15 ENCOUNTER — Ambulatory Visit (HOSPITAL_COMMUNITY): Payer: Medicare HMO | Admitting: Physical Therapy

## 2020-11-15 DIAGNOSIS — M5412 Radiculopathy, cervical region: Secondary | ICD-10-CM

## 2020-11-15 DIAGNOSIS — R293 Abnormal posture: Secondary | ICD-10-CM | POA: Diagnosis not present

## 2020-11-15 NOTE — Telephone Encounter (Signed)
Called and spoke with pt about results per Dr. Leta Baptist note. Pt verbalized understanding.

## 2020-11-15 NOTE — Telephone Encounter (Signed)
-----   Message from Penni Bombard, MD sent at 11/15/2020  2:14 PM EST ----- Mild degenerative changes; but overall ok. Recommend conservative mgmt of symptoms. Continue current plan. -VRP

## 2020-11-15 NOTE — Therapy (Signed)
[facility] [address] [place], [place], [address] Phone: [phone]   Fax:  [phone]  Physical Therapy Treatment  Patient Details  Name: Laura Abbott MRN: [id number] Date of Birth: October 12, 1946 Referring Provider (PT): Stana Bunting   Encounter Date: 11/15/2020   PT End of Session - 11/15/20 1129    Visit Number 2    Number of Visits 12    Date for PT Re-Evaluation 12/21/20    Authorization Type [employer]    Authorization Time Period 11/09/20-12/21/20    Progress Note Due on Visit 10    PT Start Time 1120    PT Stop Time 3810    PT Time Calculation (min) 38 min    Activity Tolerance Patient tolerated treatment well    Behavior During Therapy [facility] for tasks assessed/performed           Past Medical History:  Diagnosis Date   Anxiety    Arthritis    Asthma    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, right DCIS   COPD (chronic obstructive pulmonary disease) ([place])    COPD exacerbation ([place]) 01/03/2014   With hypoxia   Depression    Emphysema of lung ([place])    Family history of breast cancer    Family history of colon cancer    Family history of kidney cancer    Family history of lung cancer    Family history of pancreatic cancer    GERD (gastroesophageal reflux disease)    History of kidney stones    Hyperlipidemia    Hypertension    Insomnia    Lung cancer ([place])    Obesity 01/05/2014   Osteoporosis    Osteoporosis, unspecified 12/01/2013   Pneumonia    x2   Tobacco abuse 12/01/2013   Ulnar nerve impingement, left     Past Surgical History:  Procedure Laterality Date   ABDOMINAL HYSTERECTOMY     BLADDER SURGERY     had tacked   BREAST LUMPECTOMY WITH NEEDLE LOCALIZATION AND AXILLARY SENTINEL LYMPH NODE BX Right 11/09/2013   Procedure: BREAST LUMPECTOMY WITH NEEDLE LOCALIZATION AND AXILLARY SENTINEL LYMPH NODE BIOPSY;  Surgeon: Harl Bowie, MD;  Location: [place];  Service: General;   Laterality: Right;   BREAST SURGERY Right    lumpectomy   CHOLECYSTECTOMY     NODE DISSECTION Left 12/15/2018   Procedure: NODE DISSECTION LEFT LUNG;  Surgeon: Melrose Nakayama, MD;  Location: [place];  Service: Thoracic;  Laterality: Left;   PORTACATH PLACEMENT Right 01/19/2019   Procedure: INSERTION PORT-A-CATH;  Surgeon: Aviva Signs, MD;  Location: [facility];  Service: General;  Laterality: Right;   VIDEO ASSISTED THORACOSCOPY (VATS)/ LOBECTOMY Left 12/15/2018   Procedure: VIDEO ASSISTED THORACOSCOPY (VATS)/LEFT UPPER LOBECTOMY;  Surgeon: Melrose Nakayama, MD;  Location: [place];  Service: Thoracic;  Laterality: Left;    There were no vitals filed for this visit.   Subjective Assessment - 11/15/20 1129    Subjective Patient says her neck is hurting. Patient says she has been compliant with HEP. She can feel these working when she does them.    Pertinent History OA, headaches everyday,    Patient Stated Goals decreased headaches  and pain , improved headaches and improved motion    Currently in Pain? Yes    Pain Score 8     Pain Location Neck    Pain Orientation Left    Pain Descriptors / Indicators Sore    Pain Type  Chronic pain    Pain Onset More than a month ago    Pain Frequency Constant                             [facility] Adult PT Treatment/Exercise - 11/15/20 0001      Neck Exercises: Standing   Other Standing Exercises band rows and extension x 15 each RTB      Neck Exercises: Seated   Cervical Isometrics Right lateral flexion;Left lateral flexion;5 secs;5 reps   using ball for LT side due to shoulder pain   Neck Retraction 10 reps    Shoulder Rolls Backwards;20 reps    Other Seated Exercise scapular retraction 10 x 5"    Other Seated Exercise cervical excursion 3D x 10 each                    PT Short Term Goals - 11/09/20 1423      PT SHORT TERM GOAL #1   Title PT to be I in her HEP to decrease her cervical pain down to no  greater than a 5/10 in the past week    Time 3    Period Weeks    Status New    Target Date 11/30/20      PT SHORT TERM GOAL #2   Title PT cervical strength to be at least 4-/5 to allow headaches to be no more than 3 x a week    Time 3    Period Weeks    Status New      PT SHORT TERM GOAL #3   Title PT to be able to verbalize the importance of proper posture in her spinal health.    Time 3    Period Weeks    Status New             PT Long Term Goals - 11/09/20 1425      PT LONG TERM GOAL #1   Title PT to be I in advance HEP in order to decrease her cervical pain to no greater than a 2/10    Time 6    Period Weeks    Status New    Target Date 12/21/20      PT LONG TERM GOAL #2   Title Pt cervical spine strength to be at least 4+/5 to allow no headaches in the past month    Time 6    Period Weeks    Status New      PT LONG TERM GOAL #3   Title Pt cervical rotation to have increased by 5 degrees both directions in order to allow safer driving    Time 6    Period Weeks    Status New                 Plan - 11/15/20 1156    Clinical Impression Statement Patient tolerated session well overall today but does note some discomfort in neck during exercise. Patient required modification to cervical isometrics as she is not able to comfortably hold LT hand to head. Patient also unable to perform supine decompression exercise with band as she says she does not tolerate this position because of pain. Added band strengthening in standing position. Reviewed therapy goals and educated patient on proper form and function of all added exercises today. Issued patient updated HEP handout. Patient will continue to benefit from skilled therapy services to progress postural strength and  cervical mobility to reduce pain and improve LOF with ADLs.    Personal Factors and Comorbidities Comorbidity 3+;Fitness;Social Background;Time since onset of injury/illness/exacerbation     Comorbidities OA, osteroporosis, hx Cancer,    Examination-Activity Limitations Bed Mobility;Carry;Lift;Other    Examination-Participation Restrictions Cleaning;Driving;Laundry;Shop    Stability/Clinical Decision Making Evolving/Moderate complexity    Rehab Potential Fair    PT Frequency 2x / week    PT Duration 6 weeks    PT Treatment/Interventions Manual techniques;Patient/family education;Therapeutic activities;Therapeutic exercise;Passive range of motion    PT Next Visit Plan Manual to address pain and restriction. Progress isometric exercises and t band decompression exercises as well as cervical stabilization as tolerated    PT Home Exercise Plan posture; cervical and scapular retraction exercises. 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   Genetic testing 12/24/2018   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   Lung cancer ([place]) 12/15/2018   Family history of breast cancer    Family history of pancreatic cancer    Family history of lung cancer    Family history of kidney cancer    Family history of colon cancer    Lung mass 11/24/2018   Hypokalemia 11/25/2016   Preventive measure 10/16/2014   Obesity 01/05/2014   Bronchitis 01/03/2014   COPD exacerbation ([place]) 01/03/2014   Acute respiratory failure with hypoxia ([place]) 01/03/2014   Hyperglycemia 01/03/2014   Hypertension 01/03/2014   Osteoporosis  12/01/2013   Tobacco abuse 12/01/2013   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

## 2020-11-15 NOTE — Patient Instructions (Signed)
Access Code: [email or web address] URL: [email or web address] Date: 11/15/2020 Prepared by: Josue Hector  Exercises Shoulder Rolls in Sitting - 3 x daily - 7 x weekly - 2 sets - 10 reps Isometric Cervical Sidebending at wall with Ball - 1-2 x daily - 7 x weekly - 1 sets - 10 reps - 5 second hold Standing Row with Anchored Resistance - 1-2 x daily - 7 x weekly - 2 sets - 10 reps Shoulder Extension with Resistance - 1-2 x daily - 7 x weekly - 2 sets - 10 reps

## 2020-11-17 ENCOUNTER — Telehealth (HOSPITAL_COMMUNITY): Payer: Self-pay | Admitting: Physical Therapy

## 2020-11-17 ENCOUNTER — Ambulatory Visit (HOSPITAL_COMMUNITY): Payer: Medicare HMO

## 2020-11-17 ENCOUNTER — Telehealth (HOSPITAL_COMMUNITY): Payer: Self-pay

## 2020-11-17 ENCOUNTER — Other Ambulatory Visit (HOSPITAL_COMMUNITY): Payer: Self-pay | Admitting: Internal Medicine

## 2020-11-17 NOTE — Telephone Encounter (Signed)
pt called to cx all of her appts due to she cannot afford therapy at this time. PT WANTS TO BE DISCHARGED

## 2020-11-21 ENCOUNTER — Ambulatory Visit (HOSPITAL_COMMUNITY): Payer: Medicare HMO | Admitting: Physical Therapy

## 2020-11-22 ENCOUNTER — Ambulatory Visit (INDEPENDENT_AMBULATORY_CARE_PROVIDER_SITE_OTHER): Payer: Medicare HMO | Admitting: Obstetrics & Gynecology

## 2020-11-22 ENCOUNTER — Other Ambulatory Visit (HOSPITAL_COMMUNITY): Payer: Self-pay | Admitting: Internal Medicine

## 2020-11-22 ENCOUNTER — Other Ambulatory Visit: Payer: Self-pay

## 2020-11-22 ENCOUNTER — Encounter: Payer: Self-pay | Admitting: Obstetrics & Gynecology

## 2020-11-22 VITALS — Ht 63.0 in | Wt 206.5 lb

## 2020-11-22 DIAGNOSIS — R300 Dysuria: Secondary | ICD-10-CM

## 2020-11-22 DIAGNOSIS — Z1231 Encounter for screening mammogram for malignant neoplasm of breast: Secondary | ICD-10-CM

## 2020-11-22 DIAGNOSIS — N3281 Overactive bladder: Secondary | ICD-10-CM

## 2020-11-22 LAB — POCT URINALYSIS DIPSTICK
Glucose, UA: NEGATIVE
Ketones, UA: NEGATIVE
Nitrite, UA: NEGATIVE
Protein, UA: NEGATIVE

## 2020-11-22 MED ORDER — MIRABEGRON ER 25 MG PO TB24: ORAL_TABLET | ORAL | 3 refills | Status: DC

## 2020-11-22 NOTE — Progress Notes (Signed)
Chief Complaint  Patient presents with  . having trouble holding urine      74 y.o. [id number] No LMP recorded. Patient has had a hysterectomy. The current method of family planning is status post hysterectomy.  Outpatient Encounter Medications as of 11/22/2020  Medication Sig Note  . albuterol (PROVENTIL HFA;VENTOLIN HFA) 108 (90 BASE) MCG/ACT inhaler Inhale 2 puffs into the lungs 3 (three) times daily. Also use the inhaler every 2 hours if needed for worsening shortness of breath and wheezing.   . Calcium 600-200 MG-UNIT per tablet Take 1 tablet by mouth daily.   . clonazePAM (KLONOPIN) 0.5 MG tablet Take 0.5 mg by mouth at bedtime.   Marland Kitchen FLUoxetine (PROZAC) 40 MG capsule Take 40 mg by mouth daily.   . fluticasone (FLONASE) 50 MCG/ACT nasal spray Place 2 sprays into both nostrils daily.   . furosemide (LASIX) 20 MG tablet as needed.   Marland Kitchen lisinopril (PRINIVIL,ZESTRIL) 10 MG tablet Take 10 mg by mouth daily.   . metoprolol (LOPRESSOR) 50 MG tablet Take 50-75 mg by mouth 2 (two) times daily. Takes 75 mg in the morning and 50 mg in the evening   . mirabegron ER (MYRBETRIQ) 25 MG TB24 tablet Daily at bedtime   . omeprazole (PRILOSEC) 40 MG capsule Take 40 mg by mouth daily.    Marland Kitchen oxyCODONE (OXY IR/ROXICODONE) 5 MG immediate release tablet Take 1 tablet (5 mg total) by mouth every 6 (six) hours as needed for severe pain.   Marland Kitchen SPIRIVA HANDIHALER 18 MCG inhalation capsule    . tiZANidine (ZANAFLEX) 4 MG tablet    . traZODone (DESYREL) 50 MG tablet Take 50 mg by mouth at bedtime.   . [DISCONTINUED] nabumetone (RELAFEN) 500 MG tablet Take 500 mg by mouth every morning. 11/01/2020: 11/01/20 taking 2 x daily  . [DISCONTINUED] prochlorperazine (COMPAZINE) 10 MG tablet Take 1 tablet (10 mg total) by mouth every 6 (six) hours as needed for nausea or vomiting. (Patient not taking: Reported on 07/07/2020)    Facility-Administered Encounter Medications as of 11/22/2020  Medication  . sodium chloride  flush (NS) 0.9 % injection 10 mL    Subjective Pt has had uncontrolled episodic unprovoked large volume urine loss for over 1 year Not associated with laughing coughing or sneezing Gets up 3-4 times nightly Past Medical History:  Diagnosis Date  . Anxiety   . Arthritis   . Asthma   . 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 COPD (chronic obstructive pulmonary disease) ([place])   . COPD exacerbation ([place]) 01/03/2014   With hypoxia  . Depression   . Emphysema of lung ([place])   . Family history of breast cancer   . Family history of colon cancer   . Family history of kidney cancer   . Family history of lung cancer   . Family history of pancreatic cancer   . GERD (gastroesophageal reflux disease)   . History of kidney stones   . Hyperlipidemia   . Hypertension   . Insomnia   . Lung cancer ([place])   . Obesity 01/05/2014  . Osteoporosis   . Osteoporosis, unspecified 12/01/2013  . Pneumonia    x2  . Tobacco abuse 12/01/2013  . Ulnar nerve impingement, left     Past Surgical History:  Procedure Laterality Date  . ABDOMINAL HYSTERECTOMY    . BLADDER SURGERY     had tacked  . BREAST LUMPECTOMY WITH NEEDLE LOCALIZATION AND AXILLARY SENTINEL LYMPH NODE  BX Right 11/09/2013   Procedure: BREAST LUMPECTOMY WITH NEEDLE LOCALIZATION AND AXILLARY SENTINEL LYMPH NODE BIOPSY;  Surgeon: Harl Bowie, MD;  Location: [facility];  Service: General;  Laterality: Right;  . BREAST SURGERY Right    lumpectomy  . CHOLECYSTECTOMY    . NODE DISSECTION Left 12/15/2018   Procedure: NODE DISSECTION LEFT LUNG;  Surgeon: Melrose Nakayama, MD;  Location: [facility];  Service: Thoracic;  Laterality: Left;  . PORTACATH PLACEMENT Right 01/19/2019   Procedure: INSERTION PORT-A-CATH;  Surgeon: Aviva Signs, MD;  Location[facility];  Service: General;  Laterality: Right;  Marland Kitchen VIDEO ASSISTED THORACOSCOPY (VATS)/ LOBECTOMY Left 12/15/2018   Procedure: VIDEO ASSISTED THORACOSCOPY (VATS)/LEFT UPPER LOBECTOMY;  Surgeon:  Melrose Nakayama, MD;  [facility];  Service: Thoracic;  Laterality: Left;    OB History    Gravida  4   Para  3   Term  3   Preterm      AB  1   Living  3     SAB      IAB      Ectopic  1   Multiple      Live Births  3           No Known Allergies  Social History   Socioeconomic History  . Marital status: Married    Spouse name: Not on file  . Number of children: 3  . Years of education: Not on file  . Highest education level: Not on file  Occupational History  . Occupation: Database administrator    Comment: Engineering geologist, retired    Comment: Forensic psychologist    Comment: [employer]  Tobacco Use  . Smoking status: Former Smoker    Packs/day: 0.50    Years: 43.00    Pack years: 21.50    Types: Cigarettes    Quit date: 10/24/2018    Years since quitting: 2.0  . Smokeless tobacco: Never Used  Vaping Use  . Vaping Use: Never used  Substance and Sexual Activity  . Alcohol use: Never  . Drug use: No  . Sexual activity: Not Currently    Birth control/protection: Surgical    Comment: hyst  Other Topics Concern  . Not on file  Social History Narrative   Lives with husband   Caffeine-- coffee 2 c daily   Social Determinants of Health   Financial Resource Strain: Not on file  Food Insecurity: No Food Insecurity  . Worried About Charity fundraiser in the Last Year: Never true  . Ran Out of Food in the Last Year: Never true  Transportation Needs: Not on file  Physical Activity: Not on file  Stress: Not on file  Social Connections: Not on file    Family History  Problem Relation Age of Onset  . Lung cancer Mother 58       non smoker  . Rheum arthritis Mother   . Pancreatic cancer Father 69       d. 61  . Leukemia Sister        dx in her 43s  . Heart disease Sister   . Colon cancer Sister        dx and died in her 67s  . Heart disease Sister   . Lung cancer Maternal Aunt        non-smoker  . Heart disease Sister   . Breast cancer  Sister        dx early 40s  . Heart disease  Brother   . Cirrhosis Brother        d. 52  . Aneurysm Brother        d. 95s  . Fibromyalgia Daughter   . Kidney cancer Nephew 71  . Kidney cancer Nephew        dx in 53s; great nephew  . Leukemia Nephew   . Liver disease Nephew   . Other Niece        white matter on brain; dx late 84s  . Leukemia Nephew     Medications:       Current Outpatient Medications:  .  albuterol (PROVENTIL HFA;VENTOLIN HFA) 108 (90 BASE) MCG/ACT inhaler, Inhale 2 puffs into the lungs 3 (three) times daily. Also use the inhaler every 2 hours if needed for worsening shortness of breath and wheezing., Disp: 1 Inhaler, Rfl: 3 .  Calcium 600-200 MG-UNIT per tablet, Take 1 tablet by mouth daily., Disp: , Rfl:  .  clonazePAM (KLONOPIN) 0.5 MG tablet, Take 0.5 mg by mouth at bedtime., Disp: , Rfl:  .  FLUoxetine (PROZAC) 40 MG capsule, Take 40 mg by mouth daily., Disp: , Rfl:  .  fluticasone (FLONASE) 50 MCG/ACT nasal spray, Place 2 sprays into both nostrils daily., Disp: , Rfl:  .  furosemide (LASIX) 20 MG tablet, as needed., Disp: , Rfl:  .  lisinopril (PRINIVIL,ZESTRIL) 10 MG tablet, Take 10 mg by mouth daily., Disp: , Rfl:  .  metoprolol (LOPRESSOR) 50 MG tablet, Take 50-75 mg by mouth 2 (two) times daily. Takes 75 mg in the morning and 50 mg in the evening, Disp: , Rfl:  .  mirabegron ER (MYRBETRIQ) 25 MG TB24 tablet, Daily at bedtime, Disp: 90 tablet, Rfl: 3 .  omeprazole (PRILOSEC) 40 MG capsule, Take 40 mg by mouth daily. , Disp: , Rfl:  .  oxyCODONE (OXY IR/ROXICODONE) 5 MG immediate release tablet, Take 1 tablet (5 mg total) by mouth every 6 (six) hours as needed for severe pain., Disp: 20 tablet, Rfl: 0 .  SPIRIVA HANDIHALER 18 MCG inhalation capsule, , Disp: , Rfl:  .  tiZANidine (ZANAFLEX) 4 MG tablet, , Disp: , Rfl:  .  traZODone (DESYREL) 50 MG tablet, Take 50 mg by mouth at bedtime., Disp: , Rfl:  No current facility-administered medications for this  visit.  Facility-Administered Medications Ordered in Other Visits:  .  sodium chloride flush (NS) 0.9 % injection 10 mL, 10 mL, Intracatheter, PRN, Derek Jack, MD, 10 mL at 02/10/19 1513  Objective Height 5\' 3"  (1.6 m), weight 206 lb 8 oz (93.7 kg).  General WDWN female NAD Vulva:  normal appearing vulva with no masses, tenderness or lesions Minimal bladder relaxation anatomical POP is not contributing to her urinary loss Vagina:  normal mucosa, no discharge, atrophic Cervix:  absent Uterus:  absent Adnexa: no masses palpated   Pertinent ROS .lehrosm   Labs or studies     Impression Diagnoses this Encounter::   ICD-10-CM   1. OAB (overactive bladder)  N32.81    Begin myrbetriq 25 qhs  2. Burning with urination  R30.0 POCT Urinalysis Dipstick    Established relevant diagnosis(es):   Plan/Recommendations: Meds ordered this encounter  Medications  . mirabegron ER (MYRBETRIQ) 25 MG TB24 tablet    Sig: Daily at bedtime    Dispense:  90 tablet    Refill:  3    Labs or Scans Ordered: Orders Placed This Encounter  Procedures  . POCT Urinalysis Dipstick    Management:: Begin  myrbetriq 25 mg qhs  Follow up Return in about 2 months (around 01/23/2021) for Follow up, with Dr Elonda Husky.        All questions were answered.

## 2020-11-23 ENCOUNTER — Ambulatory Visit (HOSPITAL_COMMUNITY): Payer: Medicare HMO | Admitting: Physical Therapy

## 2020-11-28 ENCOUNTER — Ambulatory Visit (HOSPITAL_COMMUNITY): Payer: Medicare HMO | Admitting: Physical Therapy

## 2020-11-30 ENCOUNTER — Encounter (HOSPITAL_COMMUNITY): Payer: Medicare HMO | Admitting: Physical Therapy

## 2020-12-01 ENCOUNTER — Encounter (INDEPENDENT_AMBULATORY_CARE_PROVIDER_SITE_OTHER): Payer: Medicare HMO | Admitting: Diagnostic Neuroimaging

## 2020-12-01 ENCOUNTER — Ambulatory Visit (INDEPENDENT_AMBULATORY_CARE_PROVIDER_SITE_OTHER): Payer: Medicare HMO | Admitting: Diagnostic Neuroimaging

## 2020-12-01 DIAGNOSIS — M542 Cervicalgia: Secondary | ICD-10-CM

## 2020-12-01 DIAGNOSIS — R200 Anesthesia of skin: Secondary | ICD-10-CM

## 2020-12-01 DIAGNOSIS — Z0289 Encounter for other administrative examinations: Secondary | ICD-10-CM

## 2020-12-02 NOTE — Procedures (Signed)
[facility]  NCS (NERVE CONDUCTION STUDY) WITH EMG (ELECTROMYOGRAPHY) REPORT   STUDY DATE: 12/01/20 PATIENT NAME: Laura Abbott DOB: 12/19/1945 MRN: [id number]  ORDERING CLINICIAN: Andrey Spearman, MD   TECHNOLOGIST: Sherre Scarlet ELECTROMYOGRAPHER: Earlean Polka. Tanisa Lagace, MD  CLINICAL INFORMATION: 75 year old female with left hand numbness.  FINDINGS: NERVE CONDUCTION STUDY:  Left median motor response has normal distal latency, slightly decreased amplitude, normal conduction velocity.  Left ulnar motor response (with recording over the ADM) has normal distal latency, normal amplitude, slow conduction velocity with stimulation above the elbow (30 m/s).  Left motor response (with recording over the FDI) has normal distal latency, normal amplitude, slow conduction velocity with stimulation above the elbow (38 m/s).  Right ulnar motor response is normal.  Left median and bilateral ulnar sensory responses are normal.  Left ulnar F wave latency is prolonged.  Right ulnar F-wave latency is normal.    NEEDLE ELECTROMYOGRAPHY:  Needle examination of left upper extremity and left cervical paraspinal muscles normal.   IMPRESSION:   Abnormal study demonstrating: -Left ulnar motor response has slow conduction velocity across the elbow, normal distal latency and normal needle EMG of left upper extremity.  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    INTERPRETING PHYSICIAN:  Penni Bombard, MD Certified in Neurology, Neurophysiology and Neuroimaging  [facility] [address], [place], [place] [address] [phone]   [facility]    Nerve / Sites Muscle Latency Ref. Amplitude Ref. Rel Amp Segments Distance Velocity Ref. Area    ms ms mV mV %  cm m/s m/s mVms  L Median - APB     Wrist APB 3.6 ?4.4 3.2 ?4.0 100 Wrist - APB 7   14.8     Upper arm APB 7.3  3.0   93.8 Upper arm - Wrist 21 58 ?49 14.6                            L Ulnar - ADM     Wrist ADM 3.0 ?3.3 4.1 ?6.0 100 Wrist - ADM 7   7.7     B.Elbow ADM 6.8  6.9  169 B.Elbow - Wrist 19 49 ?49 22.0     A.Elbow ADM 10.2  6.4  93 A.Elbow - B.Elbow 10 30 ?49 24.4         A.Elbow - Wrist      R Ulnar - ADM     Wrist ADM 2.6 ?3.3 10.1 ?6.0 100 Wrist - ADM 7   25.6     B.Elbow ADM 5.8  9.9  98.7 B.Elbow - Wrist 20 62 ?49 26.9     A.Elbow ADM 7.5  9.0  90.4 A.Elbow - B.Elbow 10 61 ?49 24.0         A.Elbow - Wrist      L Ulnar - FDI     Wrist FDI 4.1 ?4.5 10.6 ?7.0 100 Wrist - FDI 8   20.9     B.Elbow FDI 8.1  8.1  76.9 B.Elbow - Wrist 20 50 ?49 17.0     A.Elbow FDI 10.8  6.5  80.1 A.Elbow - B.Elbow 10 38 ?49 19.4  [place]    Nerve / Sites Rec. Site Peak Lat Ref.  Amp Ref. Segments Distance    ms ms V V  cm  L Median - Orthodromic (Dig II, Mid palm)     Dig II Wrist 2.7 ?3.4 11 ?10 Dig II - Wrist 13  L Ulnar - Orthodromic, (Dig V, Mid palm)     Dig V Wrist 2.8 ?3.1 6 ?5 Dig V - Wrist 11  R Ulnar - Orthodromic, (Dig V, Mid palm)     Dig V Wrist 2.6 ?3.1 8 ?5 Dig V - Wrist 37           F  Wave    Nerve F Lat Ref.   ms ms  L Ulnar - ADM 35.1 ?32.0  R Ulnar - ADM 26.3 ?32.0         EMG Summary Table    Spontaneous MUAP Recruitment  Muscle IA Fib PSW Fasc Other Amp Dur. Poly Pattern  L. Deltoid Normal None None None _______ Normal Normal Normal Normal  L. Biceps brachii Normal None None None _______ Normal Normal Normal Normal  L. Triceps brachii Normal None None None _______ Normal Normal Normal Normal  L. Flexor carpi ulnaris Normal None None None _______ Normal Normal Normal Normal  L. Flexor carpi radialis Normal None None None _______ Normal Normal Normal Normal  L. First dorsal interosseous Normal None None None _______ Normal Normal Normal Normal  L. Cervical paraspinals Normal None None None _______ Normal Normal Normal Normal

## 2020-12-06 ENCOUNTER — Encounter (HOSPITAL_COMMUNITY): Payer: Medicare HMO | Admitting: Physical Therapy

## 2020-12-08 ENCOUNTER — Encounter (HOSPITAL_COMMUNITY): Payer: Medicare HMO | Admitting: Physical Therapy

## 2020-12-13 ENCOUNTER — Encounter (HOSPITAL_COMMUNITY): Payer: Medicare HMO | Admitting: Physical Therapy

## 2020-12-15 ENCOUNTER — Encounter (HOSPITAL_COMMUNITY): Payer: Medicare HMO | Admitting: Physical Therapy

## 2020-12-15 DIAGNOSIS — Z85118 Personal history of other malignant neoplasm of bronchus and lung: Secondary | ICD-10-CM | POA: Diagnosis not present

## 2020-12-15 DIAGNOSIS — Z6837 Body mass index (BMI) 37.0-37.9, adult: Secondary | ICD-10-CM | POA: Diagnosis not present

## 2020-12-15 DIAGNOSIS — G8929 Other chronic pain: Secondary | ICD-10-CM | POA: Diagnosis not present

## 2020-12-15 DIAGNOSIS — Z6836 Body mass index (BMI) 36.0-36.9, adult: Secondary | ICD-10-CM | POA: Diagnosis not present

## 2020-12-15 DIAGNOSIS — E782 Mixed hyperlipidemia: Secondary | ICD-10-CM | POA: Diagnosis not present

## 2020-12-15 DIAGNOSIS — G5620 Lesion of ulnar nerve, unspecified upper limb: Secondary | ICD-10-CM | POA: Diagnosis not present

## 2020-12-15 DIAGNOSIS — E559 Vitamin D deficiency, unspecified: Secondary | ICD-10-CM | POA: Diagnosis not present

## 2020-12-15 DIAGNOSIS — F32A Depression, unspecified: Secondary | ICD-10-CM | POA: Diagnosis not present

## 2020-12-15 DIAGNOSIS — J209 Acute bronchitis, unspecified: Secondary | ICD-10-CM | POA: Diagnosis not present

## 2020-12-15 DIAGNOSIS — J440 Chronic obstructive pulmonary disease with acute lower respiratory infection: Secondary | ICD-10-CM | POA: Diagnosis not present

## 2020-12-15 DIAGNOSIS — M549 Dorsalgia, unspecified: Secondary | ICD-10-CM | POA: Diagnosis not present

## 2020-12-19 ENCOUNTER — Encounter (HOSPITAL_COMMUNITY): Payer: Medicare HMO | Admitting: Physical Therapy

## 2020-12-21 ENCOUNTER — Encounter (HOSPITAL_COMMUNITY): Payer: Medicare HMO | Admitting: Physical Therapy

## 2020-12-22 DIAGNOSIS — Z85118 Personal history of other malignant neoplasm of bronchus and lung: Secondary | ICD-10-CM | POA: Diagnosis not present

## 2020-12-22 DIAGNOSIS — Z6837 Body mass index (BMI) 37.0-37.9, adult: Secondary | ICD-10-CM | POA: Diagnosis not present

## 2020-12-22 DIAGNOSIS — J440 Chronic obstructive pulmonary disease with acute lower respiratory infection: Secondary | ICD-10-CM | POA: Diagnosis not present

## 2020-12-22 DIAGNOSIS — J209 Acute bronchitis, unspecified: Secondary | ICD-10-CM | POA: Diagnosis not present

## 2020-12-22 DIAGNOSIS — M549 Dorsalgia, unspecified: Secondary | ICD-10-CM | POA: Diagnosis not present

## 2020-12-22 DIAGNOSIS — F32A Depression, unspecified: Secondary | ICD-10-CM | POA: Diagnosis not present

## 2020-12-22 DIAGNOSIS — G8929 Other chronic pain: Secondary | ICD-10-CM | POA: Diagnosis not present

## 2020-12-22 DIAGNOSIS — G5620 Lesion of ulnar nerve, unspecified upper limb: Secondary | ICD-10-CM | POA: Diagnosis not present

## 2020-12-24 DIAGNOSIS — Z85118 Personal history of other malignant neoplasm of bronchus and lung: Secondary | ICD-10-CM | POA: Diagnosis not present

## 2020-12-24 DIAGNOSIS — F32A Depression, unspecified: Secondary | ICD-10-CM | POA: Diagnosis not present

## 2020-12-24 DIAGNOSIS — J440 Chronic obstructive pulmonary disease with acute lower respiratory infection: Secondary | ICD-10-CM | POA: Diagnosis not present

## 2020-12-27 ENCOUNTER — Ambulatory Visit: Payer: Medicare HMO | Admitting: Cardiology

## 2020-12-27 NOTE — Progress Notes (Deleted)
Clinical Summary Laura Abbott is a 75 y.o.female seen today as a new consult, referred by Dr Jimmye Norman for the following medical problems.  1. Chest pain  ?echo done ?stress done?  2. Lung CA?  3. Hyperlipidemia   4. COPD   Past Medical History:  Diagnosis Date  . Anxiety   . Arthritis   . Asthma   . 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 COPD (chronic obstructive pulmonary disease) ([place])   . COPD exacerbation ([place]) 01/03/2014   With hypoxia  . Depression   . Emphysema of lung ([place])   . Family history of breast cancer   . Family history of colon cancer   . Family history of kidney cancer   . Family history of lung cancer   . Family history of pancreatic cancer   . GERD (gastroesophageal reflux disease)   . History of kidney stones   . Hyperlipidemia   . Hypertension   . Insomnia   . Lung cancer ([place])   . Obesity 01/05/2014  . Osteoporosis   . Osteoporosis, unspecified 12/01/2013  . Pneumonia    x2  . Tobacco abuse 12/01/2013  . Ulnar nerve impingement, left      No Known Allergies   Current Outpatient Medications  Medication Sig Dispense Refill  . albuterol (PROVENTIL HFA;VENTOLIN HFA) 108 (90 BASE) MCG/ACT inhaler Inhale 2 puffs into the lungs 3 (three) times daily. Also use the inhaler every 2 hours if needed for worsening shortness of breath and wheezing. 1 Inhaler 3  . Calcium 600-200 MG-UNIT per tablet Take 1 tablet by mouth daily.    . clonazePAM (KLONOPIN) 0.5 MG tablet Take 0.5 mg by mouth at bedtime.    Marland Kitchen FLUoxetine (PROZAC) 40 MG capsule Take 40 mg by mouth daily.    . fluticasone (FLONASE) 50 MCG/ACT nasal spray Place 2 sprays into both nostrils daily.    . furosemide (LASIX) 20 MG tablet as needed.    Marland Kitchen lisinopril (PRINIVIL,ZESTRIL) 10 MG tablet Take 10 mg by mouth daily.    . metoprolol (LOPRESSOR) 50 MG tablet Take 50-75 mg by mouth 2 (two) times daily. Takes 75 mg in the morning and 50 mg in the evening    . mirabegron ER (MYRBETRIQ) 25  MG TB24 tablet Daily at bedtime 90 tablet 3  . omeprazole (PRILOSEC) 40 MG capsule Take 40 mg by mouth daily.     Marland Kitchen oxyCODONE (OXY IR/ROXICODONE) 5 MG immediate release tablet Take 1 tablet (5 mg total) by mouth every 6 (six) hours as needed for severe pain. 20 tablet 0  . SPIRIVA HANDIHALER 18 MCG inhalation capsule     . tiZANidine (ZANAFLEX) 4 MG tablet     . traZODone (DESYREL) 50 MG tablet Take 50 mg by mouth at bedtime.     No current facility-administered medications for this visit.   Facility-Administered Medications Ordered in Other Visits  Medication Dose Route Frequency Provider Last Rate Last Admin  . sodium chloride flush (NS) 0.9 % injection 10 mL  10 mL Intracatheter PRN Derek Jack, MD   10 mL at 02/10/19 1513     Past Surgical History:  Procedure Laterality Date  . ABDOMINAL HYSTERECTOMY    . BLADDER SURGERY     had tacked  . BREAST LUMPECTOMY WITH NEEDLE LOCALIZATION AND AXILLARY SENTINEL LYMPH NODE BX Right 11/09/2013   Procedure: BREAST LUMPECTOMY WITH NEEDLE LOCALIZATION AND AXILLARY SENTINEL LYMPH NODE BIOPSY;  Surgeon: Harl Bowie,  MD;  Location: [facility];  Service: General;  Laterality: Right;  . BREAST SURGERY Right    lumpectomy  . CHOLECYSTECTOMY    . NODE DISSECTION Left 12/15/2018   Procedure: NODE DISSECTION LEFT LUNG;  Surgeon: Melrose Nakayama, MD;  Location: [facility];  Service: Thoracic;  Laterality: Left;  . PORTACATH PLACEMENT Right 01/19/2019   Procedure: INSERTION PORT-A-CATH;  Surgeon: Aviva Signs, MD;  Location: [facility];  Service: General;  Laterality: Right;  Marland Kitchen VIDEO ASSISTED THORACOSCOPY (VATS)/ LOBECTOMY Left 12/15/2018   Procedure: VIDEO ASSISTED THORACOSCOPY (VATS)/LEFT UPPER LOBECTOMY;  Surgeon: Melrose Nakayama, MD;  Location: [facility];  Service: Thoracic;  Laterality: Left;     No Known Allergies    Family History  Problem Relation Age of Onset  . Lung cancer Mother 38       non smoker  . Rheum arthritis Mother    . Pancreatic cancer Father 7       d. 38  . Leukemia Sister        dx in her 57s  . Heart disease Sister   . Colon cancer Sister        dx and died in her 34s  . Heart disease Sister   . Lung cancer Maternal Aunt        non-smoker  . Heart disease Sister   . Breast cancer Sister        dx early 41s  . Heart disease Brother   . Cirrhosis Brother        d. 43  . Aneurysm Brother        d. 64s  . Fibromyalgia Daughter   . Kidney cancer Nephew 71  . Kidney cancer Nephew        dx in 44s; great nephew  . Leukemia Nephew   . Liver disease Nephew   . Other Niece        white matter on brain; dx late 85s  . Leukemia Nephew      Social History Laura Abbott reports that she quit smoking about 2 years ago. Her smoking use included cigarettes. She has a 21.50 pack-year smoking history. She has never used smokeless tobacco. Laura Abbott reports no history of alcohol use.   Review of Systems CONSTITUTIONAL: No weight loss, fever, chills, weakness or fatigue.  HEENT: Eyes: No visual loss, blurred vision, double vision or yellow sclerae.No hearing loss, sneezing, congestion, runny nose or sore throat.  SKIN: No rash or itching.  CARDIOVASCULAR:  RESPIRATORY: No shortness of breath, cough or sputum.  GASTROINTESTINAL: No anorexia, nausea, vomiting or diarrhea. No abdominal pain or blood.  GENITOURINARY: No burning on urination, no polyuria NEUROLOGICAL: No headache, dizziness, syncope, paralysis, ataxia, numbness or tingling in the extremities. No change in bowel or bladder control.  MUSCULOSKELETAL: No muscle, back pain, joint pain or stiffness.  LYMPHATICS: No enlarged nodes. No history of splenectomy.  PSYCHIATRIC: No history of depression or anxiety.  ENDOCRINOLOGIC: No reports of sweating, cold or heat intolerance. No polyuria or polydipsia.  Marland Kitchen   Physical Examination There were no vitals filed for this visit. There were no vitals filed for this visit.  Gen: resting comfortably,  no acute distress HEENT: no scleral icterus, pupils equal round and reactive, no palptable cervical adenopathy,  CV Resp: Clear to auscultation bilaterally GI: abdomen is soft, non-tender, non-distended, normal bowel sounds, no hepatosplenomegaly MSK: extremities are warm, no edema.  Skin: warm, no rash Neuro:  no focal deficits Psych: appropriate affect  Diagnostic Studies     Assessment and Plan        Arnoldo Lenis, M.D., F.A.C.C.

## 2020-12-29 DIAGNOSIS — J440 Chronic obstructive pulmonary disease with acute lower respiratory infection: Secondary | ICD-10-CM | POA: Diagnosis not present

## 2020-12-29 DIAGNOSIS — G8929 Other chronic pain: Secondary | ICD-10-CM | POA: Diagnosis not present

## 2020-12-29 DIAGNOSIS — F32A Depression, unspecified: Secondary | ICD-10-CM | POA: Diagnosis not present

## 2020-12-29 DIAGNOSIS — Z85118 Personal history of other malignant neoplasm of bronchus and lung: Secondary | ICD-10-CM | POA: Diagnosis not present

## 2020-12-29 DIAGNOSIS — M810 Age-related osteoporosis without current pathological fracture: Secondary | ICD-10-CM | POA: Diagnosis not present

## 2020-12-29 DIAGNOSIS — Z6837 Body mass index (BMI) 37.0-37.9, adult: Secondary | ICD-10-CM | POA: Diagnosis not present

## 2020-12-29 DIAGNOSIS — J209 Acute bronchitis, unspecified: Secondary | ICD-10-CM | POA: Diagnosis not present

## 2020-12-29 DIAGNOSIS — M549 Dorsalgia, unspecified: Secondary | ICD-10-CM | POA: Diagnosis not present

## 2020-12-29 DIAGNOSIS — G5620 Lesion of ulnar nerve, unspecified upper limb: Secondary | ICD-10-CM | POA: Diagnosis not present

## 2021-01-02 ENCOUNTER — Ambulatory Visit (HOSPITAL_COMMUNITY)
Admission: RE | Admit: 2021-01-02 | Discharge: 2021-01-02 | Disposition: A | Discharge status: Home / Self Care | Payer: Medicare HMO | Source: Ambulatory Visit | Attending: Internal Medicine | Admitting: Internal Medicine

## 2021-01-02 ENCOUNTER — Other Ambulatory Visit: Payer: Self-pay

## 2021-01-02 ENCOUNTER — Encounter (HOSPITAL_COMMUNITY): Payer: Self-pay

## 2021-01-02 DIAGNOSIS — Z1231 Encounter for screening mammogram for malignant neoplasm of breast: Secondary | ICD-10-CM | POA: Insufficient documentation

## 2021-01-02 HISTORY — DX: Personal history of irradiation: Z92.3

## 2021-01-06 ENCOUNTER — Inpatient Hospital Stay (HOSPITAL_COMMUNITY): Payer: Medicare HMO | Attending: Hematology

## 2021-01-06 ENCOUNTER — Other Ambulatory Visit: Payer: Self-pay

## 2021-01-06 ENCOUNTER — Ambulatory Visit (HOSPITAL_COMMUNITY)
Admission: RE | Admit: 2021-01-06 | Discharge: 2021-01-06 | Disposition: A | Discharge status: Home / Self Care | Payer: Medicare HMO | Source: Ambulatory Visit | Attending: Hematology | Admitting: Hematology

## 2021-01-06 DIAGNOSIS — R197 Diarrhea, unspecified: Secondary | ICD-10-CM | POA: Insufficient documentation

## 2021-01-06 DIAGNOSIS — Z803 Family history of malignant neoplasm of breast: Secondary | ICD-10-CM | POA: Diagnosis not present

## 2021-01-06 DIAGNOSIS — Z86012 Personal history of benign carcinoid tumor: Secondary | ICD-10-CM | POA: Diagnosis not present

## 2021-01-06 DIAGNOSIS — R5383 Other fatigue: Secondary | ICD-10-CM | POA: Insufficient documentation

## 2021-01-06 DIAGNOSIS — C7A8 Other malignant neuroendocrine tumors: Secondary | ICD-10-CM | POA: Diagnosis not present

## 2021-01-06 DIAGNOSIS — Z8249 Family history of ischemic heart disease and other diseases of the circulatory system: Secondary | ICD-10-CM | POA: Insufficient documentation

## 2021-01-06 DIAGNOSIS — I2510 Atherosclerotic heart disease of native coronary artery without angina pectoris: Secondary | ICD-10-CM | POA: Diagnosis not present

## 2021-01-06 DIAGNOSIS — E669 Obesity, unspecified: Secondary | ICD-10-CM | POA: Diagnosis not present

## 2021-01-06 DIAGNOSIS — C3490 Malignant neoplasm of unspecified part of unspecified bronchus or lung: Secondary | ICD-10-CM

## 2021-01-06 DIAGNOSIS — F419 Anxiety disorder, unspecified: Secondary | ICD-10-CM | POA: Insufficient documentation

## 2021-01-06 DIAGNOSIS — Z806 Family history of leukemia: Secondary | ICD-10-CM | POA: Insufficient documentation

## 2021-01-06 DIAGNOSIS — Z87891 Personal history of nicotine dependence: Secondary | ICD-10-CM | POA: Insufficient documentation

## 2021-01-06 DIAGNOSIS — R079 Chest pain, unspecified: Secondary | ICD-10-CM | POA: Diagnosis not present

## 2021-01-06 DIAGNOSIS — Z8269 Family history of other diseases of the musculoskeletal system and connective tissue: Secondary | ICD-10-CM | POA: Insufficient documentation

## 2021-01-06 DIAGNOSIS — Z8261 Family history of arthritis: Secondary | ICD-10-CM | POA: Diagnosis not present

## 2021-01-06 DIAGNOSIS — M549 Dorsalgia, unspecified: Secondary | ICD-10-CM | POA: Insufficient documentation

## 2021-01-06 DIAGNOSIS — Z800 Family history of malignant neoplasm of digestive organs: Secondary | ICD-10-CM | POA: Insufficient documentation

## 2021-01-06 DIAGNOSIS — R519 Headache, unspecified: Secondary | ICD-10-CM | POA: Diagnosis not present

## 2021-01-06 DIAGNOSIS — I700 Atherosclerosis of aorta: Secondary | ICD-10-CM | POA: Insufficient documentation

## 2021-01-06 DIAGNOSIS — Z801 Family history of malignant neoplasm of trachea, bronchus and lung: Secondary | ICD-10-CM | POA: Insufficient documentation

## 2021-01-06 DIAGNOSIS — J439 Emphysema, unspecified: Secondary | ICD-10-CM | POA: Diagnosis not present

## 2021-01-06 DIAGNOSIS — Z8379 Family history of other diseases of the digestive system: Secondary | ICD-10-CM | POA: Insufficient documentation

## 2021-01-06 DIAGNOSIS — Z79899 Other long term (current) drug therapy: Secondary | ICD-10-CM | POA: Insufficient documentation

## 2021-01-06 DIAGNOSIS — Z8051 Family history of malignant neoplasm of kidney: Secondary | ICD-10-CM | POA: Insufficient documentation

## 2021-01-06 DIAGNOSIS — I10 Essential (primary) hypertension: Secondary | ICD-10-CM | POA: Insufficient documentation

## 2021-01-06 DIAGNOSIS — J984 Other disorders of lung: Secondary | ICD-10-CM | POA: Diagnosis not present

## 2021-01-06 LAB — CBC WITH DIFFERENTIAL/PLATELET
Abs Immature Granulocytes: 0.02 10*3/uL (ref 0.00–0.07)
Basophils Absolute: 0.1 10*3/uL (ref 0.0–0.1)
Basophils Relative: 1 %
Eosinophils Absolute: 0.2 10*3/uL (ref 0.0–0.5)
Eosinophils Relative: 2 %
HCT: 42.9 % (ref 36.0–46.0)
Hemoglobin: 12.9 g/dL (ref 12.0–15.0)
Immature Granulocytes: 0 %
Lymphocytes Relative: 27 %
Lymphs Abs: 2 10*3/uL (ref 0.7–4.0)
MCH: 28.4 pg (ref 26.0–34.0)
MCHC: 30.1 g/dL (ref 30.0–36.0)
MCV: 94.5 fL (ref 80.0–100.0)
Monocytes Absolute: 0.5 10*3/uL (ref 0.1–1.0)
Monocytes Relative: 7 %
Neutro Abs: 4.7 10*3/uL (ref 1.7–7.7)
Neutrophils Relative %: 63 %
Platelets: 277 10*3/uL (ref 150–400)
RBC: 4.54 MIL/uL (ref 3.87–5.11)
RDW: 15.2 % (ref 11.5–15.5)
WBC: 7.5 10*3/uL (ref 4.0–10.5)
nRBC: 0 % (ref 0.0–0.2)

## 2021-01-06 LAB — COMPREHENSIVE METABOLIC PANEL
ALT: 19 U/L (ref 0–44)
AST: 23 U/L (ref 15–41)
Albumin: 3.6 g/dL (ref 3.5–5.0)
Alkaline Phosphatase: 91 U/L (ref 38–126)
Anion gap: 7 (ref 5–15)
BUN: 13 mg/dL (ref 8–23)
CO2: 27 mmol/L (ref 22–32)
Calcium: 8.5 mg/dL — ABNORMAL LOW (ref 8.9–10.3)
Chloride: 104 mmol/L (ref 98–111)
Creatinine, Ser: 0.52 mg/dL (ref 0.44–1.00)
GFR, Estimated: >60 mL/min (ref >60)
Glucose, Bld: 118 mg/dL — ABNORMAL HIGH (ref 70–99)
Potassium: 4.2 mmol/L (ref 3.5–5.1)
Sodium: 138 mmol/L (ref 135–145)
Total Bilirubin: 0.6 mg/dL (ref 0.3–1.2)
Total Protein: 7.5 g/dL (ref 6.5–8.1)

## 2021-01-06 MED ORDER — IOHEXOL 300 MG/ML  SOLN
75.0000 mL | Freq: Once | INTRAMUSCULAR | Status: AC | PRN
  Administered 2021-01-06: 75 mL via INTRAVENOUS

## 2021-01-12 ENCOUNTER — Inpatient Hospital Stay (HOSPITAL_BASED_OUTPATIENT_CLINIC_OR_DEPARTMENT_OTHER): Payer: Medicare HMO | Admitting: Hematology

## 2021-01-12 ENCOUNTER — Other Ambulatory Visit: Payer: Self-pay

## 2021-01-12 VITALS — BP 160/55 | HR 63 | Temp 96.9°F | Resp 21 | Wt 208.8 lb

## 2021-01-12 DIAGNOSIS — R519 Headache, unspecified: Secondary | ICD-10-CM | POA: Diagnosis not present

## 2021-01-12 DIAGNOSIS — E669 Obesity, unspecified: Secondary | ICD-10-CM | POA: Diagnosis not present

## 2021-01-12 DIAGNOSIS — R197 Diarrhea, unspecified: Secondary | ICD-10-CM | POA: Diagnosis not present

## 2021-01-12 DIAGNOSIS — C3490 Malignant neoplasm of unspecified part of unspecified bronchus or lung: Secondary | ICD-10-CM

## 2021-01-12 DIAGNOSIS — I10 Essential (primary) hypertension: Secondary | ICD-10-CM | POA: Diagnosis not present

## 2021-01-12 DIAGNOSIS — R5383 Other fatigue: Secondary | ICD-10-CM | POA: Diagnosis not present

## 2021-01-12 DIAGNOSIS — F419 Anxiety disorder, unspecified: Secondary | ICD-10-CM | POA: Diagnosis not present

## 2021-01-12 DIAGNOSIS — M549 Dorsalgia, unspecified: Secondary | ICD-10-CM | POA: Diagnosis not present

## 2021-01-12 DIAGNOSIS — C7A8 Other malignant neuroendocrine tumors: Secondary | ICD-10-CM | POA: Diagnosis not present

## 2021-01-12 DIAGNOSIS — R079 Chest pain, unspecified: Secondary | ICD-10-CM | POA: Diagnosis not present

## 2021-01-12 MED ORDER — LEVOFLOXACIN 500 MG PO TABS: 500.0000 mg | ORAL_TABLET | Freq: Every day | ORAL | 0 refills | Status: DC

## 2021-01-12 NOTE — Progress Notes (Signed)
Laura Abbott, [place] [address]   CLINIC:  Medical Oncology/Hematology  PCP:  Hunter Nation, MD [address] / [place] [address] [phone]   REASON FOR VISIT:  Follow-up for left neuroendocrine lung cancer  PRIOR THERAPY:  1. Left upper lobectomy on 12/15/2018. 2. Carboplatin and etoposide x 4 cycles from 01/20/2019 to 03/23/2019.  NGS Results: Not done  CURRENT THERAPY: Surveillance  BRIEF ONCOLOGIC HISTORY:  Oncology History  DCIS (ductal carcinoma in situ) of breast  10/28/2013 Initial Diagnosis   DCIS (ductal carcinoma in situ) of breast   12/21/2018 Genetic Testing   Negative genetic testing on the multicancer panel.  The Multi-Gene Panel offered by [facility] includes sequencing and/or deletion duplication testing of the following 84 genes: AIP, ALK, APC, ATM, AXIN2,BAP1,  BARD1, BLM, BMPR1A, BRCA1, BRCA2, BRIP1, CASR, CDC73, CDH1, CDK4, CDKN1B, CDKN1C, CDKN2A (p14ARF), CDKN2A (p16INK4a), CEBPA, CHEK2, CTNNA1, DICER1, DIS3L2, EGFR (c.2369C>T, p.Thr790Met variant only), EPCAM (Deletion/duplication testing only), FH, FLCN, GATA2, GPC3, GREM1 (Promoter region deletion/duplication testing only), HOXB13 (c.251G>A, p.Gly84Glu), HRAS, KIT, MAX, MEN1, MET, MITF (c.952G>A, p.Glu318Lys variant only), MLH1, MSH2, MSH3, MSH6, MUTYH, NBN, NF1, NF2, NTHL1, PALB2, PDGFRA, PHOX2B, PMS2, POLD1, POLE, POT1, PRKAR1A, PTCH1, PTEN, RAD50, RAD51C, RAD51D, RB1, RECQL4, RET, RUNX1, SDHAF2, SDHA (sequence changes only), SDHB, SDHC, SDHD, SMAD4, SMARCA4, SMARCB1, SMARCE1, STK11, SUFU, TERC, TERT, TMEM127, TP53, TSC1, TSC2, VHL, WRN and WT1.  The report date is 12/21/2018.   Lung cancer ([place])  12/15/2018 Initial Diagnosis   Lung cancer ([place])   12/21/2018 Genetic Testing   Negative genetic testing on the multicancer panel.  The Multi-Gene Panel offered by [facility] includes sequencing and/or deletion duplication testing of the following 84 genes: AIP, ALK, APC,  ATM, AXIN2,BAP1,  BARD1, BLM, BMPR1A, BRCA1, BRCA2, BRIP1, CASR, CDC73, CDH1, CDK4, CDKN1B, CDKN1C, CDKN2A (p14ARF), CDKN2A (p16INK4a), CEBPA, CHEK2, CTNNA1, DICER1, DIS3L2, EGFR (c.2369C>T, p.Thr790Met variant only), EPCAM (Deletion/duplication testing only), FH, FLCN, GATA2, GPC3, GREM1 (Promoter region deletion/duplication testing only), HOXB13 (c.251G>A, p.Gly84Glu), HRAS, KIT, MAX, MEN1, MET, MITF (c.952G>A, p.Glu318Lys variant only), MLH1, MSH2, MSH3, MSH6, MUTYH, NBN, NF1, NF2, NTHL1, PALB2, PDGFRA, PHOX2B, PMS2, POLD1, POLE, POT1, PRKAR1A, PTCH1, PTEN, RAD50, RAD51C, RAD51D, RB1, RECQL4, RET, RUNX1, SDHAF2, SDHA (sequence changes only), SDHB, SDHC, SDHD, SMAD4, SMARCA4, SMARCB1, SMARCE1, STK11, SUFU, TERC, TERT, TMEM127, TP53, TSC1, TSC2, VHL, WRN and WT1.  The report date is 12/21/2018.   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  The Multi-Gene Panel offered by [facility] includes sequencing and/or deletion duplication testing of the following 84 genes: AIP, ALK, APC, ATM, AXIN2,BAP1,  BARD1, BLM, BMPR1A, BRCA1, BRCA2, BRIP1, CASR, CDC73, CDH1, CDK4, CDKN1B, CDKN1C, CDKN2A (p14ARF), CDKN2A (p16INK4a), CEBPA, CHEK2, CTNNA1, DICER1, DIS3L2, EGFR (c.2369C>T, p.Thr790Met variant only), EPCAM (Deletion/duplication testing only), FH, FLCN, GATA2, GPC3, GREM1 (Promoter region deletion/duplication testing only), HOXB13 (c.251G>A, p.Gly84Glu), HRAS, KIT, MAX, MEN1, MET, MITF (c.952G>A, p.Glu318Lys variant only), MLH1, MSH2, MSH3, MSH6, MUTYH, NBN, NF1, NF2, NTHL1, PALB2, PDGFRA, PHOX2B, PMS2, POLD1, POLE, POT1, PRKAR1A, PTCH1, PTEN, RAD50, RAD51C, RAD51D, RB1, RECQL4, RET, RUNX1, SDHAF2, SDHA (sequence changes only), SDHB, SDHC, SDHD, SMAD4, SMARCA4, SMARCB1, SMARCE1, STK11, SUFU, TERC, TERT, TMEM127, TP53, TSC1, TSC2, VHL, WRN and WT1.  The report date  is 12/21/2018.   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[facility])  01/12/2019 Initial Diagnosis   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  [facility])   01/20/2019 -  Chemotherapy   The patient had palonosetron (ALOXI) injection 0.25 mg, 0.25 mg, Intravenous,  Once, 4 of 4 cycles Administration: 0.25 mg (01/20/2019), 0.25 mg (02/10/2019), 0.25 mg (03/02/2019), 0.25 mg (03/23/2019) pegfilgrastim-cbqv (UDENYCA) injection 6 mg, 6 mg, Subcutaneous, Once, 4 of 4 cycles Administration: 6 mg (01/23/2019), 6 mg (02/13/2019), 6 mg (03/05/2019), 6 mg (03/26/2019) CARBOplatin (PARAPLATIN) 500 mg in sodium chloride 0.9 % 250 mL chemo infusion, 500 mg (100 % of original dose 504.5 mg), Intravenous,  Once, 4 of 4 cycles Dose modification:   (original dose 504.5 mg, Cycle 1),   (original dose 504.5 mg, Cycle 2),   (original dose 504.5 mg, Cycle 3),   (original dose 504.5 mg, Cycle 4) Administration: 500 mg (01/20/2019), 500 mg (02/10/2019), 500 mg (03/02/2019), 500 mg (03/23/2019) etoposide (VEPESID) 200 mg in sodium chloride 0.9 % 500 mL chemo infusion, 100 mg/m2 = 200 mg, Intravenous,  Once, 4 of 4 cycles Administration: 200 mg (01/20/2019), 200 mg (01/21/2019), 200 mg (01/22/2019), 200 mg (02/10/2019), 200 mg (02/11/2019), 200 mg (02/12/2019), 200 mg (03/02/2019), 200 mg (03/03/2019), 200 mg (03/04/2019), 200 mg (03/23/2019), 200 mg (03/24/2019), 200 mg (03/25/2019)  for chemotherapy treatment.      CANCER STAGING: Cancer Staging DCIS (ductal carcinoma in situ) of breast Staging form: Breast, AJCC 7th Edition - Clinical stage from 12/01/2013: Stage 0 (Tis (DCIS), N0, cM0) - Signed by Heath Lark, MD on 12/01/2013 - Pathologic: Stage 0 (Tis (DCIS), N0, cM0) - Signed by Heath Lark, MD on 12/01/2013   INTERVAL HISTORY:  Laura Abbott, a 75 y.o. female, returns for routine follow-up of her left neuroendocrine lung cancer. Laura Abbott was last seen on 07/07/2020.   Today she is accompanied by her daughter and she reports feeling fair. She continues having diarrhea at baseline. She  continues having anxiety and intermittent headaches on the top front half of her head. She has been having a productive cough with white sputum which developed several weeks ago. She continues having back pain. She denies having any recent infections. She has mid-sternal CP which occurs spontaneously and will see the cardiologist next week.   REVIEW OF SYSTEMS:  Review of Systems  Constitutional: Positive for appetite change (25%) and fatigue (25%).  Respiratory: Positive for cough (white sputum).   Cardiovascular: Positive for chest pain (spontaneously in mid-sternum).  Gastrointestinal: Positive for diarrhea (at baseline).  Musculoskeletal: Positive for back pain (7/10 back pain).  Neurological: Positive for headaches (top front-half of head).  Psychiatric/Behavioral: The patient is nervous/anxious.   All other systems reviewed and are negative.   PAST MEDICAL/SURGICAL HISTORY:  Past Medical History:  Diagnosis Date  . Anxiety   . Arthritis   . Asthma   . Breast cancer ([facility])    breast, right DCIS  . COPD (chronic obstructive pulmonary disease) ([facility])   . COPD exacerbation ([facility]) 01/03/2014   With hypoxia  . Depression   . Emphysema of lung ([facility])   . Family history of breast cancer   . Family history of colon cancer   . Family history of kidney cancer   . Family history of lung cancer   . Family history of pancreatic cancer   . GERD (gastroesophageal reflux disease)   . History of kidney stones   . Hyperlipidemia   . Hypertension   . Insomnia   . Lung cancer ([facility])   . Obesity 01/05/2014  . Osteoporosis   . Osteoporosis, unspecified 12/01/2013  . Personal history of  radiation therapy    rt breast  . Pneumonia    x2  . Tobacco abuse 12/01/2013  . Ulnar nerve impingement, left    Past Surgical History:  Procedure Laterality Date  . ABDOMINAL HYSTERECTOMY    . BLADDER SURGERY     had tacked  . BREAST LUMPECTOMY Right   . BREAST LUMPECTOMY WITH NEEDLE LOCALIZATION AND  AXILLARY SENTINEL LYMPH NODE BX Right 11/09/2013   Procedure: BREAST LUMPECTOMY WITH NEEDLE LOCALIZATION AND AXILLARY SENTINEL LYMPH NODE BIOPSY;  Surgeon: Harl Bowie, MD;  Location: [facility];  Service: General;  Laterality: Right;  . BREAST SURGERY Right    lumpectomy  . CHOLECYSTECTOMY    . NODE DISSECTION Left 12/15/2018   Procedure: NODE DISSECTION LEFT LUNG;  Surgeon: Melrose Nakayama, MD;  Location: [facility];  Service: Thoracic;  Laterality: Left;  . PORTACATH PLACEMENT Right 01/19/2019   Procedure: INSERTION PORT-A-CATH;  Surgeon: Aviva Signs, MD;  Location: [facility];  Service: General;  Laterality: Right;  Marland Kitchen VIDEO ASSISTED THORACOSCOPY (VATS)/ LOBECTOMY Left 12/15/2018   Procedure: VIDEO ASSISTED THORACOSCOPY (VATS)/LEFT UPPER LOBECTOMY;  Surgeon: Melrose Nakayama, MD;  Location:  [facility];  Service: Thoracic;  Laterality: Left;    SOCIAL HISTORY:  Social History   Socioeconomic History  . Marital status: Married    Spouse name: Not on file  . Number of children: 3  . Years of education: Not on file  . Highest education level: Not on file  Occupational History  . Occupation: Database administrator    Comment: Engineering geologist, retired    Comment: Forensic psychologist    Comment: [employer]  Tobacco Use  . Smoking status: Former Smoker    Packs/day: 0.50    Years: 43.00    Pack years: 21.50    Types: Cigarettes    Quit date: 10/24/2018    Years since quitting: 2.2  . Smokeless tobacco: Never Used  Vaping Use  . Vaping Use: Never used  Substance and Sexual Activity  . Alcohol use: Never  . Drug use: No  . Sexual activity: Not Currently    Birth control/protection: Surgical    Comment: hyst  Other Topics Concern  . Not on file  Social History Narrative   Lives with husband   Caffeine-- coffee 2 c daily   Social Determinants of Health   Financial Resource Strain: Not on file  Food Insecurity: No Food Insecurity  . Worried About Charity fundraiser in the Last Year: Never  true  . Ran Out of Food in the Last Year: Never true  Transportation Needs: Not on file  Physical Activity: Not on file  Stress: Not on file  Social Connections: Not on file  Intimate Partner Violence: Not At Risk  . Fear of Current or Ex-Partner: No  . Emotionally Abused: No  . Physically Abused: No  . Sexually Abused: No    FAMILY HISTORY:  Family History  Problem Relation Age of Onset  . Lung cancer Mother 9       non smoker  . Rheum arthritis Mother   . Pancreatic cancer Father 39       d. 18  . Leukemia Sister        dx in her 32s  . Heart disease Sister   . Colon cancer Sister        dx and died in her 3s  . Heart disease Sister   . Lung cancer Maternal Aunt  non-smoker  . Heart disease Sister   . Breast cancer Sister        dx early 98s  . Heart disease Brother   . Cirrhosis Brother        d. 45  . Aneurysm Brother        d. 34s  . Fibromyalgia Daughter   . Kidney cancer Nephew 71  . Kidney cancer Nephew        dx in 36s; great nephew  . Leukemia Nephew   . Liver disease Nephew   . Other Niece        white matter on brain; dx late 38s  . Leukemia Nephew     CURRENT MEDICATIONS:  Current Outpatient Medications  Medication Sig Dispense Refill  . albuterol (PROVENTIL HFA;VENTOLIN HFA) 108 (90 BASE) MCG/ACT inhaler Inhale 2 puffs into the lungs 3 (three) times daily. Also use the inhaler every 2 hours if needed for worsening shortness of breath and wheezing. 1 Inhaler 3  . BOOSTRIX 5-2.5-18.5 LF-MCG/0.5 injection     . Calcium 600-200 MG-UNIT per tablet Take 1 tablet by mouth daily.    . clonazePAM (KLONOPIN) 0.5 MG tablet Take 0.5 mg by mouth at bedtime.    Marland Kitchen FLUoxetine (PROZAC) 40 MG capsule Take 40 mg by mouth daily.    . fluticasone (FLONASE) 50 MCG/ACT nasal spray Place 2 sprays into both nostrils daily.    . furosemide (LASIX) 20 MG tablet as needed.    Marland Kitchen levofloxacin (LEVAQUIN) 500 MG tablet Take 1 tablet (500 mg total) by mouth daily. 10  tablet 0  . lisinopril (PRINIVIL,ZESTRIL) 10 MG tablet Take 10 mg by mouth daily.    . metoprolol (LOPRESSOR) 50 MG tablet Take 50-75 mg by mouth 2 (two) times daily. Takes 75 mg in the morning and 50 mg in the evening    . mirabegron ER (MYRBETRIQ) 25 MG TB24 tablet Daily at bedtime 90 tablet 3  . omeprazole (PRILOSEC) 40 MG capsule Take 40 mg by mouth daily.     Marland Kitchen oxyCODONE (OXY IR/ROXICODONE) 5 MG immediate release tablet Take 1 tablet (5 mg total) by mouth every 6 (six) hours as needed for severe pain. 20 tablet 0  . SPIRIVA HANDIHALER 18 MCG inhalation capsule     . tiZANidine (ZANAFLEX) 4 MG tablet     . traZODone (DESYREL) 50 MG tablet Take 50 mg by mouth at bedtime.     No current facility-administered medications for this visit.   Facility-Administered Medications Ordered in Other Visits  Medication Dose Route Frequency Provider Last Rate Last Admin  . sodium chloride flush (NS) 0.9 % injection 10 mL  10 mL Intracatheter PRN Derek Jack, MD   10 mL at 02/10/19 1513    ALLERGIES:  No Known Allergies  PHYSICAL EXAM:  Performance status (ECOG): 1 - Symptomatic but completely ambulatory  Vitals:   01/12/21 1422  BP: (!) 160/55  Pulse: 63  Resp: (!) 21  Temp: (!) 96.9 F (36.1 C)  SpO2: 95%   Wt Readings from Last 3 Encounters:  01/12/21 208 lb 12.8 oz (94.7 kg)  11/22/20 206 lb 8 oz (93.7 kg)  11/01/20 209 lb (94.8 kg)   Physical Exam Vitals reviewed.  Constitutional:      Appearance: Normal appearance. She is obese.  Cardiovascular:     Rate and Rhythm: Normal rate and regular rhythm.     Pulses: Normal pulses.     Heart sounds: Normal heart sounds.  Pulmonary:  Effort: Pulmonary effort is normal.     Breath sounds: Normal breath sounds.  Neurological:     General: No focal deficit present.     Mental Status: She is alert and oriented to person, place, and time.  Psychiatric:        Mood and Affect: Mood normal.        Behavior: Behavior normal.       LABORATORY DATA:  I have reviewed the labs as listed.  CBC Latest Ref Rng & Units 01/06/2021 07/01/2020 12/28/2019  WBC 4.0 - 10.5 K/uL 7.5 7.9 7.2  Hemoglobin 12.0 - 15.0 g/dL 12.9 13.0 12.5  Hematocrit 36.0 - 46.0 % 42.9 43.2 41.2  Platelets 150 - 400 K/uL 277 278 268   CMP Latest Ref Rng & Units 01/06/2021 07/01/2020 12/28/2019  Glucose 70 - 99 mg/dL 118(H) 115(H) 120(H)  BUN 8 - 23 mg/dL '13 12 13  ' Creatinine 0.44 - 1.00 mg/dL 0.52 0.51 0.60  Sodium 135 - 145 mmol/L 138 140 140  Potassium 3.5 - 5.1 mmol/L 4.2 4.2 4.2  Chloride 98 - 111 mmol/L 104 101 101  CO2 22 - 32 mmol/L '27 29 30  ' Calcium 8.9 - 10.3 mg/dL 8.5(L) 9.1 9.3  Total Protein 6.5 - 8.1 g/dL 7.5 7.3 7.3  Total Bilirubin 0.3 - 1.2 mg/dL 0.6 0.7 0.7  Alkaline Phos 38 - 126 U/L 91 80 79  AST 15 - 41 U/L '23 24 23  ' ALT 0 - 44 U/L '19 20 21    ' DIAGNOSTIC IMAGING:  I have independently reviewed the scans and discussed with the patient. CT Chest W Contrast  Result Date: 01/06/2021 CLINICAL DATA:  History of stage II A left upper lobe neuroendocrine carcinoma. History of left upper lobe lobectomy. EXAM: CT CHEST WITH CONTRAST TECHNIQUE: Multidetector CT imaging of the chest was performed during intravenous contrast administration. CONTRAST:  59m OMNIPAQUE IOHEXOL 300 MG/ML  SOLN COMPARISON:  07/01/2020 FINDINGS: Cardiovascular: The heart is normal in size. No pericardial effusion. The aorta is normal in caliber. No dissection. Stable moderate atherosclerotic calcifications. Stable coronary artery calcifications. Mediastinum/Nodes: Small scattered mediastinal and hilar lymph nodes are stable. No mass or adenopathy. The esophagus is grossly normal. Lungs/Pleura: Stable surgical changes from a left upper lobe lobectomy. No findings suspicious for recurrent tumor. There is a new right middle lobe density measuring 22 mm. This is irregular and somewhat elongated. I think it is most likely an area of inflammation or infection. Recommend  follow-up noncontrast chest CT in 3 months to reassess. Stable bibasilar scarring changes.  No other pulmonary lesions. Upper Abdomen: No significant upper abdominal findings. No worrisome hepatic or adrenal gland lesions. Stable vascular calcifications. Musculoskeletal: No breast masses, supraclavicular or axillary adenopathy. The thyroid gland is grossly normal. The bony thorax is intact. No worrisome bone lesions. IMPRESSION: 1. Stable surgical changes from a left upper lobe lobectomy. No findings suspicious for recurrent tumor. 2. New 22 mm right middle lobe density, likely an area of inflammation or infection. Recommend follow-up noncontrast chest CT in 3 months. 3. No mediastinal or hilar mass or adenopathy. 4. Stable atherosclerotic calcifications involving the aorta and coronary arteries. 5. Emphysema and aortic atherosclerosis. Aortic Atherosclerosis (ICD10-I70.0) and Emphysema (ICD10-J43.9). Electronically Signed   By: PMarijo SanesM.D.   On: 01/06/2021 20:30   MM 3D SCREEN BREAST BILATERAL  Result Date: 01/04/2021 CLINICAL DATA:  Screening. EXAM: DIGITAL SCREENING BILATERAL MAMMOGRAM WITH TOMOSYNTHESIS AND CAD TECHNIQUE: Bilateral screening digital craniocaudal and mediolateral oblique mammograms were  obtained. Bilateral screening digital breast tomosynthesis was performed. The images were evaluated with computer-aided detection. COMPARISON:  Previous exam(s). ACR Breast Density Category b: There are scattered areas of fibroglandular density. FINDINGS: There are no findings suspicious for malignancy. IMPRESSION: No mammographic evidence of malignancy. A result letter of this screening mammogram will be mailed directly to the patient. RECOMMENDATION: Screening mammogram in one year. (Code:SM-B-01Y) BI-RADS CATEGORY  1: Negative. Electronically Signed   By: Audie Pinto M.D.   On: 01/04/2021 14:26     ASSESSMENT:  1. 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 She was found to have an abnormal chest x-ray followed by CT scan which showed left upper lobe lung mass. -She smoked 1 to 2 packs of cigarettes per day for the last 50 years. She quit smoking on 10/23/2018. -Denies any significant weight loss. But has night sweats occasionally. Denies any hemoptysis. -PET CT scan on 11/17/2018 showed left upper lobe lung mass, 4.9 cm, no evidence of adenopathy or metastasis. MRI of the brain was negative for intracranial mets. -Left upper lobectomy and lymph node biopsy on 12/15/2018. 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  -Cancer type ID testing shows 96% probability of neuroendocrine carcinoma (small cell/large cell carcinoma). -4 cycles of adjuvant chemotherapy with carboplatin and etoposide from 01/20/2019 through 03/23/2019. -CT of the chest on 08/25/2019 showed left upper lobectomy changes with tiny pulmonary nodules unchanged. -CT chest with contrast on 01/06/2021 with stable surgical changes in the left upper lobectomy.  New 22 mm right middle lobe density, likely an area of infection/inflammation.  No adenopathy.  2. Family history: -Mother had lung cancer and was never smoker. Father had pancreatic cancer. -1 sister has colon cancer in 1 sister with leukemia. Another sister had breast cancer. -[facility] testing on 12/09/2018 was negative for hereditary malignancies.   PLAN:  1. 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 -Reviewed CT chest with contrast from 01/06/2021 which showed new 2.2 cm right middle lobe density, likely area of inflammation/infection. -Recommend 10 days of Levaquin 500 mg daily. -Recommend CT chest without contrast in 3 months and follow-up.   Orders placed this  encounter:  No orders of the defined types were placed in this encounter.    Derek Jack, MD [place] [phone]   I, Milinda Antis, am acting as a scribe for Dr. Sanda Linger.  I, Derek Jack MD, have reviewed the above documentation for accuracy and completeness, and I agree with the above.

## 2021-01-12 NOTE — Patient Instructions (Signed)
[facility] at [address] [facility] Discharge Instructions  You were seen today by Dr. Delton Coombes. He went over your recent results and scans. You will be prescribed Levaquin to take for 10 days to see if it can clean up your lungs. You will be scheduled to have a CT scan of your chest before your next visit. Dr. Delton Coombes will see you back in 3 months for labs and follow up.   Thank you for choosing [facility] at [facility] to provide your oncology and hematology care.  To afford each patient quality time with our provider, please arrive at least 15 minutes before your scheduled appointment time.   If you have a lab appointment with the [facility] please come in thru the Main Entrance and check in at the main information desk  You need to re-schedule your appointment should you arrive 10 or more minutes late.  We strive to give you quality time with our providers, and arriving late affects you and other patients whose appointments are after yours.  Also, if you no show three or more times for appointments you may be dismissed from the clinic at the providers discretion.     Again, thank you for choosing [facility].  Our hope is that these requests will decrease the amount of time that you wait before being seen by our physicians.       _____________________________________________________________  Should you have questions after your visit to [facility], please contact our office at [phone] between the hours of 8:00 a.m. and 4:30 p.m.  Voicemails left after 4:00 p.m. will not be returned until the following business day.  For prescription refill requests, have your pharmacy contact our office and allow 72 hours.    Cancer Center Support Programs:   > Cancer Support Group  2nd Tuesday of the month 1pm-2pm, Journey Room

## 2021-01-16 DIAGNOSIS — I083 Combined rheumatic disorders of mitral, aortic and tricuspid valves: Secondary | ICD-10-CM | POA: Diagnosis not present

## 2021-01-16 DIAGNOSIS — I272 Pulmonary hypertension, unspecified: Secondary | ICD-10-CM | POA: Diagnosis not present

## 2021-01-17 DIAGNOSIS — J449 Chronic obstructive pulmonary disease, unspecified: Secondary | ICD-10-CM | POA: Diagnosis not present

## 2021-01-17 DIAGNOSIS — R079 Chest pain, unspecified: Secondary | ICD-10-CM | POA: Diagnosis not present

## 2021-01-17 DIAGNOSIS — I5189 Other ill-defined heart diseases: Secondary | ICD-10-CM | POA: Diagnosis not present

## 2021-01-17 DIAGNOSIS — R931 Abnormal findings on diagnostic imaging of heart and coronary circulation: Secondary | ICD-10-CM | POA: Diagnosis not present

## 2021-01-17 DIAGNOSIS — R030 Elevated blood-pressure reading, without diagnosis of hypertension: Secondary | ICD-10-CM | POA: Diagnosis not present

## 2021-01-19 ENCOUNTER — Ambulatory Visit: Payer: Medicare HMO | Admitting: Cardiology

## 2021-01-19 ENCOUNTER — Encounter: Payer: Self-pay | Admitting: Cardiology

## 2021-01-19 VITALS — BP 156/80 | HR 64 | Ht 63.0 in | Wt 207.2 lb

## 2021-01-19 DIAGNOSIS — R0789 Other chest pain: Secondary | ICD-10-CM

## 2021-01-19 DIAGNOSIS — I10 Essential (primary) hypertension: Secondary | ICD-10-CM | POA: Diagnosis not present

## 2021-01-19 MED ORDER — LISINOPRIL 20 MG PO TABS: 20.0000 mg | ORAL_TABLET | Freq: Every day | ORAL | 1 refills | Status: DC

## 2021-01-19 NOTE — Patient Instructions (Signed)
Your physician recommends that you schedule a follow-up appointment in: AS NEEDED WITH DR Benton has recommended you make the following change in your medication:   INCREASE LISINOPRIL 20 MG DAILY   Thank you for choosing [facility]!!

## 2021-01-19 NOTE — Progress Notes (Signed)
Clinical Summary Laura Abbott is a 75 y.o.female seen today as a new patient for the following medical problems.   1. Chest pain - ongoing chest pain - sharp pain left sided, 7/10 in severity. Can occur at rest or with exertion. Not positional. Last a few minutes. No other associated symptoms.No relation with food.   12/2020 echo LVEF 60-65%, grade I dd, mild MR, mild AI, mod pulm HTN, normal RV 12/2020 nuclear stress test: possible small apical infarct, no current ischemia  2. Lung cancer/Stage IIa - history of LUL lobectomy - Carboplatin and etoposide x 4 cycles from 01/20/2019 to 03/23/2019    3. COPD - compliant with inhalers  4. HTN - home bp's 160s/70s Past Medical History:  Diagnosis Date  . Anxiety   . Arthritis   . Asthma   . 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 COPD (chronic obstructive pulmonary disease) ([place])   . COPD exacerbation ([place]) 01/03/2014   With hypoxia  . Depression   . Emphysema of lung ([place])   . Family history of breast cancer   . Family history of colon cancer   . Family history of kidney cancer   . Family history of lung cancer   . Family history of pancreatic cancer   . GERD (gastroesophageal reflux disease)   . History of kidney stones   . Hyperlipidemia   . Hypertension   . Insomnia   . Lung cancer ([place])   . Obesity 01/05/2014  . Osteoporosis   . Osteoporosis, unspecified 12/01/2013  . Personal history of radiation therapy    rt breast  . Pneumonia    x2  . Tobacco abuse 12/01/2013  . Ulnar nerve impingement, left      No Known Allergies   Current Outpatient Medications  Medication Sig Dispense Refill  . albuterol (PROVENTIL HFA;VENTOLIN HFA) 108 (90 BASE) MCG/ACT inhaler Inhale 2 puffs into the lungs 3 (three) times daily. Also use the inhaler every 2 hours if needed for worsening shortness of breath and wheezing. 1 Inhaler 3  . BOOSTRIX 5-2.5-18.5 LF-MCG/0.5 injection     . Calcium 600-200 MG-UNIT per tablet Take 1  tablet by mouth daily.    . clonazePAM (KLONOPIN) 0.5 MG tablet Take 0.5 mg by mouth at bedtime.    Marland Kitchen FLUoxetine (PROZAC) 40 MG capsule Take 40 mg by mouth daily.    . fluticasone (FLONASE) 50 MCG/ACT nasal spray Place 2 sprays into both nostrils daily.    . furosemide (LASIX) 20 MG tablet Take 20 mg by mouth as needed.    Marland Kitchen levofloxacin (LEVAQUIN) 500 MG tablet Take 1 tablet (500 mg total) by mouth daily. 10 tablet 0  . lisinopril (PRINIVIL,ZESTRIL) 10 MG tablet Take 10 mg by mouth daily.    . metoprolol (LOPRESSOR) 50 MG tablet Take 50-75 mg by mouth 2 (two) times daily. Takes 75 mg in the morning and 50 mg in the evening    . mirabegron ER (MYRBETRIQ) 25 MG TB24 tablet Daily at bedtime 90 tablet 3  . omeprazole (PRILOSEC) 40 MG capsule Take 40 mg by mouth daily.     Marland Kitchen SPIRIVA HANDIHALER 18 MCG inhalation capsule Place 1 capsule into inhaler and inhale daily at 6 (six) AM.    . traZODone (DESYREL) 50 MG tablet Take 50 mg by mouth at bedtime.     No current facility-administered medications for this visit.   Facility-Administered Medications Ordered in Other Visits  Medication Dose Route  Frequency Provider Last Rate Last Admin  . sodium chloride flush (NS) 0.9 % injection 10 mL  10 mL Intracatheter PRN Laura Jack, MD   10 mL at 02/10/19 1513     Past Surgical History:  Procedure Laterality Date  . ABDOMINAL HYSTERECTOMY    . BLADDER SURGERY     had tacked  . BREAST LUMPECTOMY Right   . BREAST LUMPECTOMY WITH NEEDLE LOCALIZATION AND AXILLARY SENTINEL LYMPH NODE BX Right 11/09/2013   Procedure: BREAST LUMPECTOMY WITH NEEDLE LOCALIZATION AND AXILLARY SENTINEL LYMPH NODE BIOPSY;  Surgeon: Laura Bowie, MD;  Location: [facility];  Service: General;  Laterality: Right;  . BREAST SURGERY Right    lumpectomy  . CHOLECYSTECTOMY    . NODE DISSECTION Left 12/15/2018   Procedure: NODE DISSECTION LEFT LUNG;  Surgeon: Laura Nakayama, MD;  Location: [facility];  Service: Thoracic;   Laterality: Left;  . PORTACATH PLACEMENT Right 01/19/2019   Procedure: INSERTION PORT-A-CATH;  Surgeon: Laura Signs, MD;  Location: [facility];  Service: General;  Laterality: Right;  Marland Kitchen VIDEO ASSISTED THORACOSCOPY (VATS)/ LOBECTOMY Left 12/15/2018   Procedure: VIDEO ASSISTED THORACOSCOPY (VATS)/LEFT UPPER LOBECTOMY;  Surgeon: Laura Nakayama, MD;  Location: [facility];  Service: Thoracic;  Laterality: Left;     No Known Allergies    Family History  Problem Relation Age of Onset  . Lung cancer Mother 44       non smoker  . Rheum arthritis Mother   . Pancreatic cancer Father 67       d. 31  . Leukemia Sister        dx in her 36s  . Heart disease Sister   . Colon cancer Sister        dx and died in her 59s  . Heart disease Sister   . Lung cancer Maternal Aunt        non-smoker  . Heart disease Sister   . Breast cancer Sister        dx early 45s  . Heart disease Brother   . Cirrhosis Brother        d. 11  . Aneurysm Brother        d. 60s  . Fibromyalgia Daughter   . Kidney cancer Nephew 71  . Kidney cancer Nephew        dx in 34s; great nephew  . Leukemia Nephew   . Liver disease Nephew   . Other Niece        white matter on brain; dx late 24s  . Leukemia Nephew      Social History Laura Abbott reports that she quit smoking about 2 years ago. Her smoking use included cigarettes. She has a 21.50 pack-year smoking history. She has never used smokeless tobacco. Laura Abbott reports no history of alcohol use.   Review of Systems CONSTITUTIONAL: No weight loss, fever, chills, weakness or fatigue.  HEENT: Eyes: No visual loss, blurred vision, double vision or yellow sclerae.No hearing loss, sneezing, congestion, runny nose or sore throat.  SKIN: No rash or itching.  CARDIOVASCULAR: per hpi RESPIRATORY: No shortness of breath, cough or sputum.  GASTROINTESTINAL: No anorexia, nausea, vomiting or diarrhea. No abdominal pain or blood.  GENITOURINARY: No burning on urination, no  polyuria NEUROLOGICAL: No headache, dizziness, syncope, paralysis, ataxia, numbness or tingling in the extremities. No change in bowel or bladder control.  MUSCULOSKELETAL: No muscle, back pain, joint pain or stiffness.  LYMPHATICS: No enlarged nodes. No history of splenectomy.  PSYCHIATRIC:  No history of depression or anxiety.  ENDOCRINOLOGIC: No reports of sweating, cold or heat intolerance. No polyuria or polydipsia.  Marland Kitchen   Physical Examination Vitals:   01/19/21 1115  BP: (!) 156/80  Pulse: 64  SpO2: 99%   Filed Weights   01/19/21 1115  Weight: 207 lb 3.2 oz (94 kg)    Gen: resting comfortably, no acute distress HEENT: no scleral icterus, pupils equal round and reactive, no palptable cervical adenopathy,  CV: RRR, no m/r/g, no jvd Resp: Clear to auscultation bilaterally GI: abdomen is soft, non-tender, non-distended, normal bowel sounds, no hepatosplenomegaly MSK: extremities are warm, no edema.  Skin: warm, no rash Neuro:  no focal deficits Psych: appropriate affect     Assessment and Plan  1. Chest pain - recent echo and stress tests were benign - no plans for additional cardiac testing at this time. If change or progression of symptoms would reasssess  2. HTN - above goal, increase lisinopril to 20mg  daily   F/u as needed   Laura Abbott, M.D

## 2021-01-23 ENCOUNTER — Encounter: Payer: Self-pay | Admitting: Obstetrics & Gynecology

## 2021-01-23 ENCOUNTER — Ambulatory Visit: Payer: Medicare HMO | Admitting: Obstetrics & Gynecology

## 2021-01-23 ENCOUNTER — Other Ambulatory Visit: Payer: Self-pay

## 2021-01-23 VITALS — BP 145/80 | HR 69

## 2021-01-23 DIAGNOSIS — J209 Acute bronchitis, unspecified: Secondary | ICD-10-CM | POA: Diagnosis not present

## 2021-01-23 DIAGNOSIS — J440 Chronic obstructive pulmonary disease with acute lower respiratory infection: Secondary | ICD-10-CM | POA: Diagnosis not present

## 2021-01-23 DIAGNOSIS — M549 Dorsalgia, unspecified: Secondary | ICD-10-CM | POA: Diagnosis not present

## 2021-01-23 DIAGNOSIS — G8929 Other chronic pain: Secondary | ICD-10-CM | POA: Diagnosis not present

## 2021-01-23 DIAGNOSIS — N3281 Overactive bladder: Secondary | ICD-10-CM | POA: Diagnosis not present

## 2021-01-23 NOTE — Progress Notes (Signed)
Follow up appointment for results of myrbetriq 25 mg qhs  Chief Complaint  Patient presents with  . Follow-up    OAB    Blood pressure (!) 145/80, pulse 69.    Pt has had "significant" improvement in her incontinence issues with rarely getting up at night and very few, small volume accidents No adverse side effects, no dry mouth  MEDS ordered this encounter: No orders of the defined types were placed in this encounter.   Orders for this encounter: No orders of the defined types were placed in this encounter.   Impression: 1. OAB (overactive bladder), dramatically improved Continue myrbetriq 25 mg qhs   Plan: As above  Follow Up: Return in about 6 months (around 07/23/2021) for Follow up, with Dr Elonda Husky.       Face to face time:  10 minutes  Greater than 50% of the visit time was spent in counseling and coordination of care with the patient.  The summary and outline of the counseling and care coordination is summarized in the note above.   All questions were answered.  Past Medical History:  Diagnosis Date  . Anxiety   . Arthritis   . Asthma   . 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 COPD (chronic obstructive pulmonary disease) ([place])   . COPD exacerbation ([place]) 01/03/2014   With hypoxia  . Depression   . Emphysema of lung ([place])   . Family history of breast cancer   . Family history of colon cancer   . Family history of kidney cancer   . Family history of lung cancer   . Family history of pancreatic cancer   . GERD (gastroesophageal reflux disease)   . History of kidney stones   . Hyperlipidemia   . Hypertension   . Insomnia   . Lung cancer ([place])   . Obesity 01/05/2014  . Osteoporosis   . Osteoporosis, unspecified 12/01/2013  . Personal history of radiation therapy    rt breast  . Pneumonia    x2  . Tobacco abuse 12/01/2013  . Ulnar nerve impingement, left     Past Surgical History:  Procedure Laterality Date  . ABDOMINAL HYSTERECTOMY    .  BLADDER SURGERY     had tacked  . BREAST LUMPECTOMY Right   . BREAST LUMPECTOMY WITH NEEDLE LOCALIZATION AND AXILLARY SENTINEL LYMPH NODE BX Right 11/09/2013   Procedure: BREAST LUMPECTOMY WITH NEEDLE LOCALIZATION AND AXILLARY SENTINEL LYMPH NODE BIOPSY;  Surgeon: Harl Bowie, MD;  Location: [facility];  Service: General;  Laterality: Right;  . BREAST SURGERY Right    lumpectomy  . CHOLECYSTECTOMY    . NODE DISSECTION Left 12/15/2018   Procedure: NODE DISSECTION LEFT LUNG;  Surgeon: Melrose Nakayama, MD;  Location: [facility];  Service: Thoracic;  Laterality: Left;  . PORTACATH PLACEMENT Right 01/19/2019   Procedure: INSERTION PORT-A-CATH;  Surgeon: Aviva Signs, MD;  Location[facility];  Service: General;  Laterality: Right;  Marland Kitchen VIDEO ASSISTED THORACOSCOPY (VATS)/ LOBECTOMY Left 12/15/2018   Procedure: VIDEO ASSISTED THORACOSCOPY (VATS)/LEFT UPPER LOBECTOMY;  Surgeon: Melrose Nakayama, MD;  Location: [facility];  Service: Thoracic;  Laterality: Left;    OB History    Gravida  4   Para  3   Term  3   Preterm      AB  1   Living  3     SAB      IAB      Ectopic  1  Multiple      Live Births  3           No Known Allergies  Social History   Socioeconomic History  . Marital status: Married    Spouse name: Not on file  . Number of children: 3  . Years of education: Not on file  . Highest education level: Not on file  Occupational History  . Occupation: Database administrator    Comment: Engineering geologist, retired    Comment: Forensic psychologist    Comment: [employer]  Tobacco Use  . Smoking status: Former Smoker    Packs/day: 0.50    Years: 43.00    Pack years: 21.50    Types: Cigarettes    Quit date: 10/24/2018    Years since quitting: 2.2  . Smokeless tobacco: Never Used  Vaping Use  . Vaping Use: Never used  Substance and Sexual Activity  . Alcohol use: Never  . Drug use: No  . Sexual activity: Not Currently    Birth control/protection: Surgical    Comment: hyst   Other Topics Concern  . Not on file  Social History Narrative   Lives with husband   Caffeine-- coffee 2 c daily   Social Determinants of Health   Financial Resource Strain: Not on file  Food Insecurity: No Food Insecurity  . Worried About Charity fundraiser in the Last Year: Never true  . Ran Out of Food in the Last Year: Never true  Transportation Needs: Not on file  Physical Activity: Not on file  Stress: Not on file  Social Connections: Not on file    Family History  Problem Relation Age of Onset  . Lung cancer Mother 27       non smoker  . Rheum arthritis Mother   . Pancreatic cancer Father 36       d. 55  . Leukemia Sister        dx in her 28s  . Heart disease Sister   . Colon cancer Sister        dx and died in her 66s  . Heart disease Sister   . Lung cancer Maternal Aunt        non-smoker  . Heart disease Sister   . Breast cancer Sister        dx early 66s  . Heart disease Brother   . Cirrhosis Brother        d. 42  . Aneurysm Brother        d. 55s  . Fibromyalgia Daughter   . Kidney cancer Nephew 71  . Kidney cancer Nephew        dx in 74s; great nephew  . Leukemia Nephew   . Liver disease Nephew   . Other Niece        white matter on brain; dx late 40s  . Leukemia Nephew

## 2021-01-31 DIAGNOSIS — M549 Dorsalgia, unspecified: Secondary | ICD-10-CM | POA: Diagnosis not present

## 2021-01-31 DIAGNOSIS — G8929 Other chronic pain: Secondary | ICD-10-CM | POA: Diagnosis not present

## 2021-01-31 DIAGNOSIS — J209 Acute bronchitis, unspecified: Secondary | ICD-10-CM | POA: Diagnosis not present

## 2021-01-31 DIAGNOSIS — Z85118 Personal history of other malignant neoplasm of bronchus and lung: Secondary | ICD-10-CM | POA: Diagnosis not present

## 2021-01-31 DIAGNOSIS — F339 Major depressive disorder, recurrent, unspecified: Secondary | ICD-10-CM | POA: Diagnosis not present

## 2021-01-31 DIAGNOSIS — J440 Chronic obstructive pulmonary disease with acute lower respiratory infection: Secondary | ICD-10-CM | POA: Diagnosis not present

## 2021-01-31 DIAGNOSIS — G5620 Lesion of ulnar nerve, unspecified upper limb: Secondary | ICD-10-CM | POA: Diagnosis not present

## 2021-01-31 DIAGNOSIS — Z6837 Body mass index (BMI) 37.0-37.9, adult: Secondary | ICD-10-CM | POA: Diagnosis not present

## 2021-02-13 DIAGNOSIS — M47816 Spondylosis without myelopathy or radiculopathy, lumbar region: Secondary | ICD-10-CM | POA: Diagnosis not present

## 2021-02-20 DIAGNOSIS — H5200 Hypermetropia, unspecified eye: Secondary | ICD-10-CM | POA: Diagnosis not present

## 2021-02-22 DIAGNOSIS — J449 Chronic obstructive pulmonary disease, unspecified: Secondary | ICD-10-CM | POA: Diagnosis not present

## 2021-02-22 DIAGNOSIS — J440 Chronic obstructive pulmonary disease with acute lower respiratory infection: Secondary | ICD-10-CM | POA: Diagnosis not present

## 2021-02-22 DIAGNOSIS — M47816 Spondylosis without myelopathy or radiculopathy, lumbar region: Secondary | ICD-10-CM | POA: Diagnosis not present

## 2021-03-13 DIAGNOSIS — M47816 Spondylosis without myelopathy or radiculopathy, lumbar region: Secondary | ICD-10-CM | POA: Diagnosis not present

## 2021-03-15 DIAGNOSIS — R7303 Prediabetes: Secondary | ICD-10-CM | POA: Diagnosis not present

## 2021-03-15 DIAGNOSIS — J209 Acute bronchitis, unspecified: Secondary | ICD-10-CM | POA: Diagnosis not present

## 2021-03-15 DIAGNOSIS — G8929 Other chronic pain: Secondary | ICD-10-CM | POA: Diagnosis not present

## 2021-03-15 DIAGNOSIS — Z85118 Personal history of other malignant neoplasm of bronchus and lung: Secondary | ICD-10-CM | POA: Diagnosis not present

## 2021-03-15 DIAGNOSIS — M549 Dorsalgia, unspecified: Secondary | ICD-10-CM | POA: Diagnosis not present

## 2021-03-15 DIAGNOSIS — F339 Major depressive disorder, recurrent, unspecified: Secondary | ICD-10-CM | POA: Diagnosis not present

## 2021-03-15 DIAGNOSIS — J440 Chronic obstructive pulmonary disease with acute lower respiratory infection: Secondary | ICD-10-CM | POA: Diagnosis not present

## 2021-03-15 DIAGNOSIS — G5620 Lesion of ulnar nerve, unspecified upper limb: Secondary | ICD-10-CM | POA: Diagnosis not present

## 2021-03-23 DIAGNOSIS — M47816 Spondylosis without myelopathy or radiculopathy, lumbar region: Secondary | ICD-10-CM | POA: Diagnosis not present

## 2021-03-29 DIAGNOSIS — J069 Acute upper respiratory infection, unspecified: Secondary | ICD-10-CM | POA: Diagnosis not present

## 2021-03-29 DIAGNOSIS — Z20828 Contact with and (suspected) exposure to other viral communicable diseases: Secondary | ICD-10-CM | POA: Diagnosis not present

## 2021-03-29 DIAGNOSIS — J45901 Unspecified asthma with (acute) exacerbation: Secondary | ICD-10-CM | POA: Diagnosis not present

## 2021-04-11 ENCOUNTER — Other Ambulatory Visit: Payer: Self-pay

## 2021-04-11 ENCOUNTER — Ambulatory Visit (HOSPITAL_COMMUNITY)
Admission: RE | Admit: 2021-04-11 | Discharge: 2021-04-11 | Disposition: A | Discharge status: Home / Self Care | Payer: Medicare HMO | Source: Ambulatory Visit | Attending: Hematology | Admitting: Hematology

## 2021-04-11 ENCOUNTER — Inpatient Hospital Stay (HOSPITAL_COMMUNITY): Payer: Medicare HMO | Attending: Hematology

## 2021-04-11 DIAGNOSIS — I700 Atherosclerosis of aorta: Secondary | ICD-10-CM | POA: Insufficient documentation

## 2021-04-11 DIAGNOSIS — R059 Cough, unspecified: Secondary | ICD-10-CM | POA: Insufficient documentation

## 2021-04-11 DIAGNOSIS — R200 Anesthesia of skin: Secondary | ICD-10-CM | POA: Insufficient documentation

## 2021-04-11 DIAGNOSIS — R5383 Other fatigue: Secondary | ICD-10-CM | POA: Diagnosis not present

## 2021-04-11 DIAGNOSIS — Z452 Encounter for adjustment and management of vascular access device: Secondary | ICD-10-CM | POA: Insufficient documentation

## 2021-04-11 DIAGNOSIS — R519 Headache, unspecified: Secondary | ICD-10-CM | POA: Insufficient documentation

## 2021-04-11 DIAGNOSIS — C3490 Malignant neoplasm of unspecified part of unspecified bronchus or lung: Secondary | ICD-10-CM

## 2021-04-11 DIAGNOSIS — I10 Essential (primary) hypertension: Secondary | ICD-10-CM | POA: Diagnosis not present

## 2021-04-11 DIAGNOSIS — Z923 Personal history of irradiation: Secondary | ICD-10-CM | POA: Insufficient documentation

## 2021-04-11 DIAGNOSIS — Z79899 Other long term (current) drug therapy: Secondary | ICD-10-CM | POA: Insufficient documentation

## 2021-04-11 DIAGNOSIS — Z87891 Personal history of nicotine dependence: Secondary | ICD-10-CM | POA: Diagnosis not present

## 2021-04-11 DIAGNOSIS — Z800 Family history of malignant neoplasm of digestive organs: Secondary | ICD-10-CM | POA: Insufficient documentation

## 2021-04-11 DIAGNOSIS — C7A8 Other malignant neuroendocrine tumors: Secondary | ICD-10-CM | POA: Insufficient documentation

## 2021-04-11 DIAGNOSIS — J9811 Atelectasis: Secondary | ICD-10-CM | POA: Diagnosis not present

## 2021-04-11 DIAGNOSIS — Z8051 Family history of malignant neoplasm of kidney: Secondary | ICD-10-CM | POA: Diagnosis not present

## 2021-04-11 DIAGNOSIS — R61 Generalized hyperhidrosis: Secondary | ICD-10-CM | POA: Insufficient documentation

## 2021-04-11 DIAGNOSIS — J984 Other disorders of lung: Secondary | ICD-10-CM | POA: Insufficient documentation

## 2021-04-11 DIAGNOSIS — I2510 Atherosclerotic heart disease of native coronary artery without angina pectoris: Secondary | ICD-10-CM | POA: Diagnosis not present

## 2021-04-11 DIAGNOSIS — Z8379 Family history of other diseases of the digestive system: Secondary | ICD-10-CM | POA: Insufficient documentation

## 2021-04-11 DIAGNOSIS — Z8249 Family history of ischemic heart disease and other diseases of the circulatory system: Secondary | ICD-10-CM | POA: Diagnosis not present

## 2021-04-11 DIAGNOSIS — Z801 Family history of malignant neoplasm of trachea, bronchus and lung: Secondary | ICD-10-CM | POA: Diagnosis not present

## 2021-04-11 DIAGNOSIS — Z8261 Family history of arthritis: Secondary | ICD-10-CM | POA: Diagnosis not present

## 2021-04-11 DIAGNOSIS — Z803 Family history of malignant neoplasm of breast: Secondary | ICD-10-CM | POA: Insufficient documentation

## 2021-04-11 DIAGNOSIS — M549 Dorsalgia, unspecified: Secondary | ICD-10-CM | POA: Diagnosis not present

## 2021-04-11 DIAGNOSIS — Z9049 Acquired absence of other specified parts of digestive tract: Secondary | ICD-10-CM | POA: Insufficient documentation

## 2021-04-11 DIAGNOSIS — Z806 Family history of leukemia: Secondary | ICD-10-CM | POA: Insufficient documentation

## 2021-04-11 LAB — CBC WITH DIFFERENTIAL/PLATELET
Abs Immature Granulocytes: 0.06 10*3/uL (ref 0.00–0.07)
Basophils Absolute: 0.1 10*3/uL (ref 0.0–0.1)
Basophils Relative: 1 %
Eosinophils Absolute: 0.1 10*3/uL (ref 0.0–0.5)
Eosinophils Relative: 1 %
HCT: 42.3 % (ref 36.0–46.0)
Hemoglobin: 12.7 g/dL (ref 12.0–15.0)
Immature Granulocytes: 1 %
Lymphocytes Relative: 22 %
Lymphs Abs: 2.3 10*3/uL (ref 0.7–4.0)
MCH: 28.7 pg (ref 26.0–34.0)
MCHC: 30 g/dL (ref 30.0–36.0)
MCV: 95.5 fL (ref 80.0–100.0)
Monocytes Absolute: 0.7 10*3/uL (ref 0.1–1.0)
Monocytes Relative: 7 %
Neutro Abs: 7.2 10*3/uL (ref 1.7–7.7)
Neutrophils Relative %: 68 %
Platelets: 290 10*3/uL (ref 150–400)
RBC: 4.43 MIL/uL (ref 3.87–5.11)
RDW: 15.8 % — ABNORMAL HIGH (ref 11.5–15.5)
WBC: 10.4 10*3/uL (ref 4.0–10.5)
nRBC: 0 % (ref 0.0–0.2)

## 2021-04-11 LAB — COMPREHENSIVE METABOLIC PANEL
ALT: 19 U/L (ref 0–44)
AST: 20 U/L (ref 15–41)
Albumin: 3.6 g/dL (ref 3.5–5.0)
Alkaline Phosphatase: 61 U/L (ref 38–126)
Anion gap: 5 (ref 5–15)
BUN: 13 mg/dL (ref 8–23)
CO2: 30 mmol/L (ref 22–32)
Calcium: 8.9 mg/dL (ref 8.9–10.3)
Chloride: 102 mmol/L (ref 98–111)
Creatinine, Ser: 0.59 mg/dL (ref 0.44–1.00)
GFR, Estimated: >60 mL/min (ref >60)
Glucose, Bld: 107 mg/dL — ABNORMAL HIGH (ref 70–99)
Potassium: 4.4 mmol/L (ref 3.5–5.1)
Sodium: 137 mmol/L (ref 135–145)
Total Bilirubin: 0.9 mg/dL (ref 0.3–1.2)
Total Protein: 7.2 g/dL (ref 6.5–8.1)

## 2021-04-14 DIAGNOSIS — M47816 Spondylosis without myelopathy or radiculopathy, lumbar region: Secondary | ICD-10-CM | POA: Diagnosis not present

## 2021-04-17 NOTE — Progress Notes (Signed)
Laura Abbott, [place] [address]   CLINIC:  Medical Oncology/Hematology  PCP:  [place] Nation, MD [address] / [place] [address] [phone]   REASON FOR VISIT:  Follow-up for left neuroendocrine lung cancer  PRIOR THERAPY:  1. Left upper lobectomy on 12/15/2018. 2. Carboplatin and etoposide x 4 cycles from 01/20/2019 to 03/23/2019.  NGS Results: not done  CURRENT THERAPY: surveillance  BRIEF ONCOLOGIC HISTORY:  Oncology History  DCIS (ductal carcinoma in situ) of breast  10/28/2013 Initial Diagnosis   DCIS (ductal carcinoma in situ) of breast   12/21/2018 Genetic Testing   Negative genetic testing on the multicancer panel.  The Multi-Gene Panel offered by [facility] includes sequencing and/or deletion duplication testing of the following 84 genes: AIP, ALK, APC, ATM, AXIN2,BAP1,  BARD1, BLM, BMPR1A, BRCA1, BRCA2, BRIP1, CASR, CDC73, CDH1, CDK4, CDKN1B, CDKN1C, CDKN2A (p14ARF), CDKN2A (p16INK4a), CEBPA, CHEK2, CTNNA1, DICER1, DIS3L2, EGFR (c.2369C>T, p.Thr790Met variant only), EPCAM (Deletion/duplication testing only), FH, FLCN, GATA2, GPC3, GREM1 (Promoter region deletion/duplication testing only), HOXB13 (c.251G>A, p.Gly84Glu), HRAS, KIT, MAX, MEN1, MET, MITF (c.952G>A, p.Glu318Lys variant only), MLH1, MSH2, MSH3, MSH6, MUTYH, NBN, NF1, NF2, NTHL1, PALB2, PDGFRA, PHOX2B, PMS2, POLD1, POLE, POT1, PRKAR1A, PTCH1, PTEN, RAD50, RAD51C, RAD51D, RB1, RECQL4, RET, RUNX1, SDHAF2, SDHA (sequence changes only), SDHB, SDHC, SDHD, SMAD4, SMARCA4, SMARCB1, SMARCE1, STK11, SUFU, TERC, TERT, TMEM127, TP53, TSC1, TSC2, VHL, WRN and WT1.  The report date is 12/21/2018.   Lung cancer ([place])  12/15/2018 Initial Diagnosis   Lung cancer ([place])   12/21/2018 Genetic Testing   Negative genetic testing on the multicancer panel.  The Multi-Gene Panel offered by [facility] includes sequencing and/or deletion duplication testing of the following 84 genes: AIP, ALK, APC,  ATM, AXIN2,BAP1,  BARD1, BLM, BMPR1A, BRCA1, BRCA2, BRIP1, CASR, CDC73, CDH1, CDK4, CDKN1B, CDKN1C, CDKN2A (p14ARF), CDKN2A (p16INK4a), CEBPA, CHEK2, CTNNA1, DICER1, DIS3L2, EGFR (c.2369C>T, p.Thr790Met variant only), EPCAM (Deletion/duplication testing only), FH, FLCN, GATA2, GPC3, GREM1 (Promoter region deletion/duplication testing only), HOXB13 (c.251G>A, p.Gly84Glu), HRAS, KIT, MAX, MEN1, MET, MITF (c.952G>A, p.Glu318Lys variant only), MLH1, MSH2, MSH3, MSH6, MUTYH, NBN, NF1, NF2, NTHL1, PALB2, PDGFRA, PHOX2B, PMS2, POLD1, POLE, POT1, PRKAR1A, PTCH1, PTEN, RAD50, RAD51C, RAD51D, RB1, RECQL4, RET, RUNX1, SDHAF2, SDHA (sequence changes only), SDHB, SDHC, SDHD, SMAD4, SMARCA4, SMARCB1, SMARCE1, STK11, SUFU, TERC, TERT, TMEM127, TP53, TSC1, TSC2, VHL, WRN and WT1.  The report date is 12/21/2018.   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  The Multi-Gene Panel offered by [facility] includes sequencing and/or deletion duplication testing of the following 84 genes: AIP, ALK, APC, ATM, AXIN2,BAP1,  BARD1, BLM, BMPR1A, BRCA1, BRCA2, BRIP1, CASR, CDC73, CDH1, CDK4, CDKN1B, CDKN1C, CDKN2A (p14ARF), CDKN2A (p16INK4a), CEBPA, CHEK2, CTNNA1, DICER1, DIS3L2, EGFR (c.2369C>T, p.Thr790Met variant only), EPCAM (Deletion/duplication testing only), FH, FLCN, GATA2, GPC3, GREM1 (Promoter region deletion/duplication testing only), HOXB13 (c.251G>A, p.Gly84Glu), HRAS, KIT, MAX, MEN1, MET, MITF (c.952G>A, p.Glu318Lys variant only), MLH1, MSH2, MSH3, MSH6, MUTYH, NBN, NF1, NF2, NTHL1, PALB2, PDGFRA, PHOX2B, PMS2, POLD1, POLE, POT1, PRKAR1A, PTCH1, PTEN, RAD50, RAD51C, RAD51D, RB1, RECQL4, RET, RUNX1, SDHAF2, SDHA (sequence changes only), SDHB, SDHC, SDHD, SMAD4, SMARCA4, SMARCB1, SMARCE1, STK11, SUFU, TERC, TERT, TMEM127, TP53, TSC1, TSC2, VHL, WRN and WT1.  The report date  is 12/21/2018.   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[place])  01/12/2019 Initial Diagnosis   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  [place])   01/20/2019 -  Chemotherapy   The patient had palonosetron (ALOXI) injection 0.25 mg, 0.25 mg, Intravenous,  Once, 4 of 4 cycles Administration: 0.25 mg (01/20/2019), 0.25 mg (02/10/2019), 0.25 mg (03/02/2019), 0.25 mg (03/23/2019) pegfilgrastim-cbqv (UDENYCA) injection 6 mg, 6 mg, Subcutaneous, Once, 4 of 4 cycles Administration: 6 mg (01/23/2019), 6 mg (02/13/2019), 6 mg (03/05/2019), 6 mg (03/26/2019) CARBOplatin (PARAPLATIN) 500 mg in sodium chloride 0.9 % 250 mL chemo infusion, 500 mg (100 % of original dose 504.5 mg), Intravenous,  Once, 4 of 4 cycles Dose modification:   (original dose 504.5 mg, Cycle 1),   (original dose 504.5 mg, Cycle 2),   (original dose 504.5 mg, Cycle 3),   (original dose 504.5 mg, Cycle 4) Administration: 500 mg (01/20/2019), 500 mg (02/10/2019), 500 mg (03/02/2019), 500 mg (03/23/2019) etoposide (VEPESID) 200 mg in sodium chloride 0.9 % 500 mL chemo infusion, 100 mg/m2 = 200 mg, Intravenous,  Once, 4 of 4 cycles Administration: 200 mg (01/20/2019), 200 mg (01/21/2019), 200 mg (01/22/2019), 200 mg (02/10/2019), 200 mg (02/11/2019), 200 mg (02/12/2019), 200 mg (03/02/2019), 200 mg (03/03/2019), 200 mg (03/04/2019), 200 mg (03/23/2019), 200 mg (03/24/2019), 200 mg (03/25/2019)  for chemotherapy treatment.      CANCER STAGING: Cancer Staging DCIS (ductal carcinoma in situ) of breast Staging form: Breast, AJCC 7th Edition - Clinical stage from 12/01/2013: Stage 0 (Tis (DCIS), N0, cM0) - Signed by Heath Lark, MD on 12/01/2013 - Pathologic: Stage 0 (Tis (DCIS), N0, cM0) - Signed by Heath Lark, MD on 12/01/2013   INTERVAL HISTORY:  Laura Abbott, a 75 y.o. female, returns for routine follow-up of her  left neuroendocrine lung cancer. Laura Abbott was last seen on 01/12/2021.   Today Laura Abbott reports feeling okay. Since her last visit Laura Abbott has had bronchitis. Laura Abbott has a cough which is productive. Laura Abbott  reports decreased energy and activity levels due to her back pain. Laura Abbott reports occasional swelling of her legs.  REVIEW OF SYSTEMS:  Review of Systems  Constitutional: Positive for appetite change (50%) and fatigue (25%).  Cardiovascular: Positive for chest pain.  Musculoskeletal: Positive for back pain (5/10).  Neurological: Positive for headaches and numbness (hands).  All other systems reviewed and are negative.   PAST MEDICAL/SURGICAL HISTORY:  Past Medical History:  Diagnosis Date  . Anxiety   . Arthritis   . Asthma   . Breast cancer ([place])    breast, right DCIS  . COPD (chronic obstructive pulmonary disease) ([place])   . COPD exacerbation ([place]) 01/03/2014   With hypoxia  . Depression   . Emphysema of lung ([place])   . Family history of breast cancer   . Family history of colon cancer   . Family history of kidney cancer   . Family history of lung cancer   . Family history of pancreatic cancer   . GERD (gastroesophageal reflux disease)   . History of kidney stones   . Hyperlipidemia   . Hypertension   . Insomnia   . Lung cancer ([place])   . Obesity 01/05/2014  . Osteoporosis   . Osteoporosis, unspecified 12/01/2013  . Personal history of radiation therapy    rt breast  . Pneumonia    x2  . Tobacco abuse 12/01/2013  . Ulnar nerve impingement, left    Past Surgical History:  Procedure Laterality Date  . ABDOMINAL HYSTERECTOMY    . BLADDER SURGERY     had tacked  . BREAST LUMPECTOMY Right   . BREAST LUMPECTOMY WITH  NEEDLE LOCALIZATION AND AXILLARY SENTINEL LYMPH NODE BX Right 11/09/2013   Procedure: BREAST LUMPECTOMY WITH NEEDLE LOCALIZATION AND AXILLARY SENTINEL LYMPH NODE BIOPSY;  Surgeon: Harl Bowie, MD;  Location: [facility];  Service: General;  Laterality: Right;  . BREAST SURGERY Right    lumpectomy  . CHOLECYSTECTOMY    . NODE DISSECTION Left 12/15/2018   Procedure: NODE DISSECTION LEFT LUNG;  Surgeon: Melrose Nakayama, MD;  Location: [facility];  Service: Thoracic;   Laterality: Left;  . PORTACATH PLACEMENT Right 01/19/2019   Procedure: INSERTION PORT-A-CATH;  Surgeon: Aviva Signs, MD;  Location: [facility];  Service: General;  Laterality: Right;  Marland Kitchen VIDEO ASSISTED THORACOSCOPY (VATS)/ LOBECTOMY Left 12/15/2018   Procedure: VIDEO ASSISTED THORACOSCOPY (VATS)/LEFT UPPER LOBECTOMY;  Surgeon: Melrose Nakayama, MD;  Location: [facility];  Service: Thoracic;  Laterality: Left;    SOCIAL HISTORY:  Social History   Socioeconomic History  . Marital status: Married    Spouse name: Not on file  . Number of children: 3  . Years of education: Not on file  . Highest education level: Not on file  Occupational History  . Occupation: Database administrator    Comment: Engineering geologist, retired    Comment: Forensic psychologist    Comment: [employer]  Tobacco Use  . Smoking status: Former Smoker    Packs/day: 0.50    Years: 43.00    Pack years: 21.50    Types: Cigarettes    Quit date: 10/24/2018    Years since quitting: 2.4  . Smokeless tobacco: Never Used  Vaping Use  . Vaping Use: Never used  Substance and Sexual Activity  . Alcohol use: Never  . Drug use: No  . Sexual activity: Not Currently    Birth control/protection: Surgical    Comment: hyst  Other Topics Concern  . Not on file  Social History Narrative   Lives with husband   Caffeine-- coffee 2 c daily   Social Determinants of Health   Financial Resource Strain: Not on file  Food Insecurity: No Food Insecurity  . Worried About Charity fundraiser in the Last Year: Never true  . Ran Out of Food in the Last Year: Never true  Transportation Needs: Not on file  Physical Activity: Not on file  Stress: Not on file  Social Connections: Not on file  Intimate Partner Violence: Not At Risk  . Fear of Current or Ex-Partner: No  . Emotionally Abused: No  . Physically Abused: No  . Sexually Abused: No    FAMILY HISTORY:  Family History  Problem Relation Age of Onset  . Lung cancer Mother 55       non smoker   . Rheum arthritis Mother   . Pancreatic cancer Father 31       d. 58  . Leukemia Sister        dx in her 68s  . Heart disease Sister   . Colon cancer Sister        dx and died in her 54s  . Heart disease Sister   . Lung cancer Maternal Aunt        non-smoker  . Heart disease Sister   . Breast cancer Sister        dx early 31s  . Heart disease Brother   . Cirrhosis Brother        d. 64  . Aneurysm Brother        d. 74s  . Fibromyalgia Daughter   . Kidney  cancer Nephew 71  . Kidney cancer Nephew        dx in 12s; great nephew  . Leukemia Nephew   . Liver disease Nephew   . Other Niece        white matter on brain; dx late 14s  . Leukemia Nephew     CURRENT MEDICATIONS:  Current Outpatient Medications  Medication Sig Dispense Refill  . albuterol (PROVENTIL HFA;VENTOLIN HFA) 108 (90 BASE) MCG/ACT inhaler Inhale 2 puffs into the lungs 3 (three) times daily. Also use the inhaler every 2 hours if needed for worsening shortness of breath and wheezing. 1 Inhaler 3  . BOOSTRIX 5-2.5-18.5 LF-MCG/0.5 injection     . Calcium 600-200 MG-UNIT per tablet Take 1 tablet by mouth daily.    . clonazePAM (KLONOPIN) 0.5 MG tablet Take 0.5 mg by mouth at bedtime.    Marland Kitchen FLUoxetine (PROZAC) 40 MG capsule Take 40 mg by mouth daily.    . fluticasone (FLONASE) 50 MCG/ACT nasal spray Place 2 sprays into both nostrils daily.    . furosemide (LASIX) 20 MG tablet Take 20 mg by mouth as needed.    Marland Kitchen levofloxacin (LEVAQUIN) 500 MG tablet Take 1 tablet (500 mg total) by mouth daily. 10 tablet 0  . lisinopril (ZESTRIL) 20 MG tablet Take 1 tablet (20 mg total) by mouth daily. 90 tablet 1  . metoprolol (LOPRESSOR) 50 MG tablet Take 50-75 mg by mouth 2 (two) times daily. Takes 75 mg in the morning and 50 mg in the evening    . mirabegron ER (MYRBETRIQ) 25 MG TB24 tablet Daily at bedtime 90 tablet 3  . omeprazole (PRILOSEC) 40 MG capsule Take 40 mg by mouth daily.     Marland Kitchen SPIRIVA HANDIHALER 18 MCG inhalation  capsule Place 1 capsule into inhaler and inhale daily at 6 (six) AM.    . traZODone (DESYREL) 50 MG tablet Take 50 mg by mouth at bedtime.     No current facility-administered medications for this visit.   Facility-Administered Medications Ordered in Other Visits  Medication Dose Route Frequency Provider Last Rate Last Admin  . sodium chloride flush (NS) 0.9 % injection 10 mL  10 mL Intracatheter PRN Derek Jack, MD   10 mL at 02/10/19 1513    ALLERGIES:  No Known Allergies  PHYSICAL EXAM:  Performance status (ECOG): 1 - Symptomatic but completely ambulatory  There were no vitals filed for this visit. Wt Readings from Last 3 Encounters:  01/19/21 207 lb 3.2 oz (94 kg)  01/12/21 208 lb 12.8 oz (94.7 kg)  11/22/20 206 lb 8 oz (93.7 kg)   Physical Exam Vitals reviewed.  Constitutional:      Appearance: Normal appearance.  Cardiovascular:     Rate and Rhythm: Normal rate and regular rhythm.     Pulses: Normal pulses.     Heart sounds: Normal heart sounds.  Pulmonary:     Effort: Pulmonary effort is normal.     Breath sounds: Normal breath sounds.  Musculoskeletal:     Right lower leg: No edema.     Left lower leg: No edema.  Neurological:     General: No focal deficit present.     Mental Status: Laura Abbott is alert and oriented to person, place, and time.  Psychiatric:        Mood and Affect: Mood normal.        Behavior: Behavior normal.      LABORATORY DATA:  I have reviewed the labs as listed.  CBC Latest Ref Rng & Units 04/11/2021 01/06/2021 07/01/2020  WBC 4.0 - 10.5 K/uL 10.4 7.5 7.9  Hemoglobin 12.0 - 15.0 g/dL 12.7 12.9 13.0  Hematocrit 36.0 - 46.0 % 42.3 42.9 43.2  Platelets 150 - 400 K/uL 290 277 278   CMP Latest Ref Rng & Units 04/11/2021 01/06/2021 07/01/2020  Glucose 70 - 99 mg/dL 107(H) 118(H) 115(H)  BUN 8 - 23 mg/dL _0 Creatinine 0.44 - 1.00 mg/dL 0.59 0.52 0.51  Sodium 135 - 145 mmol/L 137 138 140  Potassium 3.5 - 5.1 mmol/L 4.4 4.2 4.2   Chloride 98 - 111 mmol/L 102 104 101  CO2 22 - 32 mmol/L _1 Calcium 8.9 - 10.3 mg/dL 8.9 8.5(L) 9.1  Total Protein 6.5 - 8.1 g/dL 7.2 7.5 7.3  Total Bilirubin 0.3 - 1.2 mg/dL 0.9 0.6 0.7  Alkaline Phos 38 - 126 U/L 61 91 80  AST 15 - 41 U/L _2 ALT 0 - 44 U/L _3 DIAGNOSTIC IMAGING:  I have independently reviewed the scans and discussed with the patient. CT Chest Wo Contrast  Result Date: 04/12/2021 CLINICAL DATA:  Small cell lung cancer, monitor for treatment response. EXAM: CT CHEST WITHOUT CONTRAST TECHNIQUE: Multidetector CT imaging of the chest was performed following the standard protocol without IV contrast. COMPARISON:  January 06, 2021. FINDINGS: Cardiovascular: Calcified atheromatous plaque in the thoracic aorta. No aneurysmal dilation. Heart size is normal without pericardial effusion. Calcified coronary artery disease, three-vessel coronary artery disease. RIGHT-sided Port-A-Cath terminates in the proximal superior vena cava. Mediastinum/Nodes: Esophagus grossly normal. No axillary lymphadenopathy. No mediastinal or hilar lymphadenopathy. LEFT hilar distortion following changes of LEFT upper lobectomy. Lungs/Pleura: No effusion. No consolidation. Airways are patent. Postoperative changes of LEFT upper lobectomy as before. Bandlike scarring in the LEFT chest similar to the prior study. Basilar atelectasis on the RIGHT. Airways are patent. Upper Abdomen: Post cholecystectomy. Liver, spleen and pancreas unremarkable with respect imaged portions. Adrenal glands are normal. No acute gastrointestinal process on limited assessment of the upper abdomen. Musculoskeletal: Spinal degenerative changes. No acute or destructive bone finding. IMPRESSION: 1. Postoperative changes of LEFT upper lobectomy. No evidence of recurrent or metastatic disease. 2. Density in the RIGHT chest has resolved in the interval likely reflecting infectious or inflammatory changes. 3. Calcified  coronary artery disease, three-vessel coronary artery disease. 4. Aortic atherosclerosis. Aortic Atherosclerosis (ICD10-I70.0). Electronically Signed   By: Zetta Bills M.D.   On: 04/12/2021 17:18     ASSESSMENT:  1. 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 Laura Abbott was found to have an abnormal chest x-ray followed by CT scan which showed left upper lobe lung mass. -Laura Abbott smoked 1 to 2 packs of cigarettes per day for the last 50 years. Laura Abbott quit smoking on 10/23/2018. -Denies any significant weight loss. But has night sweats occasionally. Denies any hemoptysis. -PET CT scan on 11/17/2018 showed left upper lobe lung mass, 4.9 cm, no evidence of adenopathy or metastasis. MRI of the brain was negative for intracranial mets. -Left upper lobectomy and lymph node biopsy on 12/15/2018. 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  -Cancer type ID testing shows 96% probability of neuroendocrine carcinoma (small cell/large cell carcinoma). -4 cycles of adjuvant chemotherapy with carboplatin and etoposide from 01/20/2019 through 03/23/2019. -CT  of the chest on 08/25/2019 showed left upper lobectomy changes with tiny pulmonary nodules unchanged. -CT chest with contrast on 01/06/2021 with stable surgical changes in the left upper lobectomy.  New 22 mm right middle lobe density, likely an area of infection/inflammation.  No adenopathy.  2. Family history: -Mother had lung cancer and was never smoker. Father had pancreatic cancer. -1 sister has colon cancer in 1 sister with leukemia. Another sister had breast cancer. -[facility] testing on 12/09/2018 was negative for hereditary malignancies.   PLAN:  1. 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 -Laura Abbott  reports improvement in cough and no expectoration at this time. - Reviewed CT chest from 04/11/2021 which showed postoperative changes of left upper lobectomy.  Previously seen density in the right chest has resolved completely.  Other benign findings were discussed. - Reviewed her labs which showed normal CBC and LFTs. - RTC 6 months with repeat labs and CT scan of the chest.   Orders placed this encounter:  No orders of the defined types were placed in this encounter.    Derek Jack, MD [facility] [phone]   I, Thana Ates, am acting as a scribe for Dr. Derek Jack.  I, Derek Jack MD, have reviewed the above documentation for accuracy and completeness, and I agree with the above.

## 2021-04-18 ENCOUNTER — Inpatient Hospital Stay (HOSPITAL_COMMUNITY): Payer: Medicare HMO

## 2021-04-18 ENCOUNTER — Other Ambulatory Visit: Payer: Self-pay

## 2021-04-18 ENCOUNTER — Inpatient Hospital Stay (HOSPITAL_COMMUNITY): Payer: Medicare HMO | Admitting: Hematology

## 2021-04-18 VITALS — BP 164/60 | HR 59 | Temp 97.2°F | Resp 20 | Wt 205.6 lb

## 2021-04-18 DIAGNOSIS — R059 Cough, unspecified: Secondary | ICD-10-CM | POA: Diagnosis not present

## 2021-04-18 DIAGNOSIS — C3490 Malignant neoplasm of unspecified part of unspecified bronchus or lung: Secondary | ICD-10-CM

## 2021-04-18 DIAGNOSIS — I2510 Atherosclerotic heart disease of native coronary artery without angina pectoris: Secondary | ICD-10-CM | POA: Diagnosis not present

## 2021-04-18 DIAGNOSIS — Z95828 Presence of other vascular implants and grafts: Secondary | ICD-10-CM

## 2021-04-18 DIAGNOSIS — C7A8 Other malignant neuroendocrine tumors: Secondary | ICD-10-CM | POA: Diagnosis not present

## 2021-04-18 DIAGNOSIS — R200 Anesthesia of skin: Secondary | ICD-10-CM | POA: Diagnosis not present

## 2021-04-18 DIAGNOSIS — R519 Headache, unspecified: Secondary | ICD-10-CM | POA: Diagnosis not present

## 2021-04-18 DIAGNOSIS — R5383 Other fatigue: Secondary | ICD-10-CM | POA: Diagnosis not present

## 2021-04-18 DIAGNOSIS — M549 Dorsalgia, unspecified: Secondary | ICD-10-CM | POA: Diagnosis not present

## 2021-04-18 DIAGNOSIS — I10 Essential (primary) hypertension: Secondary | ICD-10-CM | POA: Diagnosis not present

## 2021-04-18 DIAGNOSIS — Z452 Encounter for adjustment and management of vascular access device: Secondary | ICD-10-CM | POA: Diagnosis not present

## 2021-04-18 MED ORDER — SODIUM CHLORIDE 0.9% FLUSH
10.0000 mL | Freq: Once | INTRAVENOUS | Status: AC
  Administered 2021-04-18: 10 mL via INTRAVENOUS

## 2021-04-18 MED ORDER — HEPARIN SOD (PORK) LOCK FLUSH 100 UNIT/ML IV SOLN
500.0000 [IU] | Freq: Once | INTRAVENOUS | Status: AC
  Administered 2021-04-18: 500 [IU] via INTRAVENOUS

## 2021-04-18 NOTE — Progress Notes (Signed)
Patient tolerated port flush well without incidence. Port flushed with NS 64ml/heparin 101ml blood return noted. Port de-accessed and band-aid applied. Pt discharged ambulatory via cane  in satisfactory condition. Pt stable before and after discharge.

## 2021-04-18 NOTE — Patient Instructions (Signed)
[facility] at [facility] Discharge Instructions  You were seen today by Dr. Katragadda. He went over your recent results. Dr. Katragadda will see you back in 6 months for labs and follow up.   Thank you for choosing [facility] at [facility] to provide your oncology and hematology care.  To afford each patient quality time with our provider, please arrive at least 15 minutes before your scheduled appointment time.   If you have a lab appointment with the Cancer Center please come in thru the Main Entrance and check in at the main information desk  You need to re-schedule your appointment should you arrive 10 or more minutes late.  We strive to give you quality time with our providers, and arriving late affects you and other patients whose appointments are after yours.  Also, if you no show three or more times for appointments you may be dismissed from the clinic at the providers discretion.     Again, thank you for choosing [facility].  Our hope is that these requests will decrease the amount of time that you wait before being seen by our physicians.       _____________________________________________________________  Should you have questions after your visit to [facility], please contact our office at [phone] between the hours of 8:00 a.m. and 4:30 p.m.  Voicemails left after 4:00 p.m. will not be returned until the following business day.  For prescription refill requests, have your pharmacy contact our office and allow 72 hours.    Cancer Center Support Programs:   > Cancer Support Group  2nd Tuesday of the month 1pm-2pm, Journey Room    

## 2021-05-04 DIAGNOSIS — M47816 Spondylosis without myelopathy or radiculopathy, lumbar region: Secondary | ICD-10-CM | POA: Diagnosis not present

## 2021-05-22 DIAGNOSIS — M47816 Spondylosis without myelopathy or radiculopathy, lumbar region: Secondary | ICD-10-CM | POA: Diagnosis not present

## 2021-05-25 DIAGNOSIS — F339 Major depressive disorder, recurrent, unspecified: Secondary | ICD-10-CM | POA: Diagnosis not present

## 2021-05-25 DIAGNOSIS — M549 Dorsalgia, unspecified: Secondary | ICD-10-CM | POA: Diagnosis not present

## 2021-05-25 DIAGNOSIS — J440 Chronic obstructive pulmonary disease with acute lower respiratory infection: Secondary | ICD-10-CM | POA: Diagnosis not present

## 2021-06-14 DIAGNOSIS — Z6836 Body mass index (BMI) 36.0-36.9, adult: Secondary | ICD-10-CM | POA: Diagnosis not present

## 2021-06-14 DIAGNOSIS — M549 Dorsalgia, unspecified: Secondary | ICD-10-CM | POA: Diagnosis not present

## 2021-06-14 DIAGNOSIS — G8929 Other chronic pain: Secondary | ICD-10-CM | POA: Diagnosis not present

## 2021-06-14 DIAGNOSIS — Z85118 Personal history of other malignant neoplasm of bronchus and lung: Secondary | ICD-10-CM | POA: Diagnosis not present

## 2021-06-14 DIAGNOSIS — J440 Chronic obstructive pulmonary disease with acute lower respiratory infection: Secondary | ICD-10-CM | POA: Diagnosis not present

## 2021-06-14 DIAGNOSIS — F339 Major depressive disorder, recurrent, unspecified: Secondary | ICD-10-CM | POA: Diagnosis not present

## 2021-06-14 DIAGNOSIS — J209 Acute bronchitis, unspecified: Secondary | ICD-10-CM | POA: Diagnosis not present

## 2021-06-14 DIAGNOSIS — E7801 Familial hypercholesterolemia: Secondary | ICD-10-CM | POA: Diagnosis not present

## 2021-06-14 DIAGNOSIS — J302 Other seasonal allergic rhinitis: Secondary | ICD-10-CM | POA: Diagnosis not present

## 2021-06-14 DIAGNOSIS — G5620 Lesion of ulnar nerve, unspecified upper limb: Secondary | ICD-10-CM | POA: Diagnosis not present

## 2021-07-03 DIAGNOSIS — R202 Paresthesia of skin: Secondary | ICD-10-CM | POA: Diagnosis not present

## 2021-07-03 DIAGNOSIS — M47816 Spondylosis without myelopathy or radiculopathy, lumbar region: Secondary | ICD-10-CM | POA: Diagnosis not present

## 2021-07-19 ENCOUNTER — Inpatient Hospital Stay (HOSPITAL_COMMUNITY): Payer: Medicare HMO | Attending: Hematology

## 2021-07-19 ENCOUNTER — Encounter (HOSPITAL_COMMUNITY): Payer: Self-pay

## 2021-07-19 VITALS — BP 156/50 | HR 55 | Temp 97.1°F | Resp 18

## 2021-07-19 DIAGNOSIS — C7A8 Other malignant neuroendocrine tumors: Secondary | ICD-10-CM | POA: Diagnosis not present

## 2021-07-19 DIAGNOSIS — Z452 Encounter for adjustment and management of vascular access device: Secondary | ICD-10-CM | POA: Insufficient documentation

## 2021-07-19 DIAGNOSIS — Z95828 Presence of other vascular implants and grafts: Secondary | ICD-10-CM

## 2021-07-19 MED ORDER — HEPARIN SOD (PORK) LOCK FLUSH 100 UNIT/ML IV SOLN
500.0000 [IU] | Freq: Once | INTRAVENOUS | Status: AC
  Administered 2021-07-19: 500 [IU] via INTRAVENOUS

## 2021-07-19 MED ORDER — SODIUM CHLORIDE 0.9% FLUSH
10.0000 mL | Freq: Once | INTRAVENOUS | Status: AC
  Administered 2021-07-19: 10 mL via INTRAVENOUS

## 2021-07-19 NOTE — Patient Instructions (Signed)
[facility]  Discharge Instructions: Thank you for choosing [facility] to provide your oncology and hematology care.  If you have a lab appointment with the [facility], please come in thru the Main Entrance and check in at the main information desk.  Wear comfortable clothing and clothing appropriate for easy access to any Portacath or PICC line.   We strive to give you quality time with your provider. You may need to reschedule your appointment if you arrive late (15 or more minutes).  Arriving late affects you and other patients whose appointments are after yours.  Also, if you miss three or more appointments without notifying the office, you may be dismissed from the clinic at the provider's discretion.      For prescription refill requests, have your pharmacy contact our office and allow 72 hours for refills to be completed.    Today your port was flushed.   To help prevent nausea and vomiting after your treatment, we encourage you to take your nausea medication as directed.  BELOW ARE SYMPTOMS THAT SHOULD BE REPORTED IMMEDIATELY: *FEVER GREATER THAN 100.4 F (38 C) OR HIGHER *CHILLS OR SWEATING *NAUSEA AND VOMITING THAT IS NOT CONTROLLED WITH YOUR NAUSEA MEDICATION *UNUSUAL SHORTNESS OF BREATH *UNUSUAL BRUISING OR BLEEDING *URINARY PROBLEMS (pain or burning when urinating, or frequent urination) *BOWEL PROBLEMS (unusual diarrhea, constipation, pain near the anus) TENDERNESS IN MOUTH AND THROAT WITH OR WITHOUT PRESENCE OF ULCERS (sore throat, sores in mouth, or a toothache) UNUSUAL RASH, SWELLING OR PAIN  UNUSUAL VAGINAL DISCHARGE OR ITCHING   Items with * indicate a potential emergency and should be followed up as soon as possible or go to the Emergency Department if any problems should occur.  Please show the CHEMOTHERAPY ALERT CARD or IMMUNOTHERAPY ALERT CARD at check-in to the Emergency Department and triage nurse.  Should you have questions after  your visit or need to cancel or reschedule your appointment, please contact [facility] [phone]  and follow the prompts.  Office hours are 8:00 a.m. to 4:30 p.m. Monday - Friday. Please note that voicemails left after 4:00 p.m. may not be returned until the following business day.  We are closed weekends and major holidays. You have access to a nurse at all times for urgent questions. Please call the main number to the clinic [phone] and follow the prompts.  For any non-urgent questions, you may also contact your provider using MyChart. We now offer e-Visits for anyone 25 and older to request care online for non-urgent symptoms. For details visit [email or web address].   Also download the MyChart app! Go to the app store, search "MyChart", open the app, select [facility], and log in with your MyChart username and password.  Due to Covid, a mask is required upon entering the hospital/clinic. If you do not have a mask, one will be given to you upon arrival. For doctor visits, patients may have 1 support person aged 23 or older with them. For treatment visits, patients cannot have anyone with them due to current Covid guidelines and our immunocompromised population.

## 2021-07-19 NOTE — Progress Notes (Signed)
Port flushed with good blood return noted. No bruising or swelling at site. Bandaid applied and patient discharged in satisfactory condition. VVS stable with no signs or symptoms of distressed noted. 

## 2021-07-26 DIAGNOSIS — M549 Dorsalgia, unspecified: Secondary | ICD-10-CM | POA: Diagnosis not present

## 2021-07-26 DIAGNOSIS — J440 Chronic obstructive pulmonary disease with acute lower respiratory infection: Secondary | ICD-10-CM | POA: Diagnosis not present

## 2021-07-26 DIAGNOSIS — F339 Major depressive disorder, recurrent, unspecified: Secondary | ICD-10-CM | POA: Diagnosis not present

## 2021-08-15 DIAGNOSIS — M47816 Spondylosis without myelopathy or radiculopathy, lumbar region: Secondary | ICD-10-CM | POA: Diagnosis not present

## 2021-08-18 DIAGNOSIS — M549 Dorsalgia, unspecified: Secondary | ICD-10-CM | POA: Diagnosis not present

## 2021-08-18 DIAGNOSIS — F339 Major depressive disorder, recurrent, unspecified: Secondary | ICD-10-CM | POA: Diagnosis not present

## 2021-08-18 DIAGNOSIS — Z85118 Personal history of other malignant neoplasm of bronchus and lung: Secondary | ICD-10-CM | POA: Diagnosis not present

## 2021-08-18 DIAGNOSIS — Z23 Encounter for immunization: Secondary | ICD-10-CM | POA: Diagnosis not present

## 2021-08-18 DIAGNOSIS — G5620 Lesion of ulnar nerve, unspecified upper limb: Secondary | ICD-10-CM | POA: Diagnosis not present

## 2021-08-18 DIAGNOSIS — E7801 Familial hypercholesterolemia: Secondary | ICD-10-CM | POA: Diagnosis not present

## 2021-08-18 DIAGNOSIS — G8929 Other chronic pain: Secondary | ICD-10-CM | POA: Diagnosis not present

## 2021-08-18 DIAGNOSIS — J440 Chronic obstructive pulmonary disease with acute lower respiratory infection: Secondary | ICD-10-CM | POA: Diagnosis not present

## 2021-08-23 DIAGNOSIS — M810 Age-related osteoporosis without current pathological fracture: Secondary | ICD-10-CM | POA: Diagnosis not present

## 2021-09-15 DIAGNOSIS — R300 Dysuria: Secondary | ICD-10-CM | POA: Diagnosis not present

## 2021-09-15 DIAGNOSIS — N390 Urinary tract infection, site not specified: Secondary | ICD-10-CM | POA: Diagnosis not present

## 2021-09-15 DIAGNOSIS — Z6837 Body mass index (BMI) 37.0-37.9, adult: Secondary | ICD-10-CM | POA: Diagnosis not present

## 2021-09-25 DIAGNOSIS — M549 Dorsalgia, unspecified: Secondary | ICD-10-CM | POA: Diagnosis not present

## 2021-09-25 DIAGNOSIS — F339 Major depressive disorder, recurrent, unspecified: Secondary | ICD-10-CM | POA: Diagnosis not present

## 2021-09-25 DIAGNOSIS — J440 Chronic obstructive pulmonary disease with acute lower respiratory infection: Secondary | ICD-10-CM | POA: Diagnosis not present

## 2021-10-16 DIAGNOSIS — Z23 Encounter for immunization: Secondary | ICD-10-CM | POA: Diagnosis not present

## 2021-10-17 ENCOUNTER — Other Ambulatory Visit: Payer: Self-pay

## 2021-10-17 ENCOUNTER — Encounter (HOSPITAL_COMMUNITY): Payer: Self-pay | Admitting: Physical Therapy

## 2021-10-17 ENCOUNTER — Inpatient Hospital Stay (HOSPITAL_COMMUNITY): Payer: Medicare HMO | Attending: Hematology

## 2021-10-17 ENCOUNTER — Ambulatory Visit (HOSPITAL_COMMUNITY)
Admission: RE | Admit: 2021-10-17 | Discharge: 2021-10-17 | Disposition: A | Discharge status: Home / Self Care | Payer: Medicare HMO | Source: Ambulatory Visit | Attending: Hematology | Admitting: Hematology

## 2021-10-17 DIAGNOSIS — G479 Sleep disorder, unspecified: Secondary | ICD-10-CM | POA: Diagnosis not present

## 2021-10-17 DIAGNOSIS — Z923 Personal history of irradiation: Secondary | ICD-10-CM | POA: Insufficient documentation

## 2021-10-17 DIAGNOSIS — Z801 Family history of malignant neoplasm of trachea, bronchus and lung: Secondary | ICD-10-CM | POA: Insufficient documentation

## 2021-10-17 DIAGNOSIS — F32A Depression, unspecified: Secondary | ICD-10-CM | POA: Diagnosis not present

## 2021-10-17 DIAGNOSIS — Z902 Acquired absence of lung [part of]: Secondary | ICD-10-CM | POA: Insufficient documentation

## 2021-10-17 DIAGNOSIS — I700 Atherosclerosis of aorta: Secondary | ICD-10-CM | POA: Insufficient documentation

## 2021-10-17 DIAGNOSIS — D0510 Intraductal carcinoma in situ of unspecified breast: Secondary | ICD-10-CM | POA: Insufficient documentation

## 2021-10-17 DIAGNOSIS — Z87442 Personal history of urinary calculi: Secondary | ICD-10-CM | POA: Insufficient documentation

## 2021-10-17 DIAGNOSIS — Z8051 Family history of malignant neoplasm of kidney: Secondary | ICD-10-CM | POA: Insufficient documentation

## 2021-10-17 DIAGNOSIS — R519 Headache, unspecified: Secondary | ICD-10-CM | POA: Diagnosis not present

## 2021-10-17 DIAGNOSIS — Z8379 Family history of other diseases of the digestive system: Secondary | ICD-10-CM | POA: Insufficient documentation

## 2021-10-17 DIAGNOSIS — Z452 Encounter for adjustment and management of vascular access device: Secondary | ICD-10-CM | POA: Diagnosis not present

## 2021-10-17 DIAGNOSIS — Z87891 Personal history of nicotine dependence: Secondary | ICD-10-CM | POA: Diagnosis not present

## 2021-10-17 DIAGNOSIS — M549 Dorsalgia, unspecified: Secondary | ICD-10-CM | POA: Insufficient documentation

## 2021-10-17 DIAGNOSIS — M47814 Spondylosis without myelopathy or radiculopathy, thoracic region: Secondary | ICD-10-CM | POA: Insufficient documentation

## 2021-10-17 DIAGNOSIS — R42 Dizziness and giddiness: Secondary | ICD-10-CM | POA: Diagnosis not present

## 2021-10-17 DIAGNOSIS — R5383 Other fatigue: Secondary | ICD-10-CM | POA: Insufficient documentation

## 2021-10-17 DIAGNOSIS — I10 Essential (primary) hypertension: Secondary | ICD-10-CM | POA: Insufficient documentation

## 2021-10-17 DIAGNOSIS — Z806 Family history of leukemia: Secondary | ICD-10-CM | POA: Diagnosis not present

## 2021-10-17 DIAGNOSIS — R61 Generalized hyperhidrosis: Secondary | ICD-10-CM | POA: Diagnosis not present

## 2021-10-17 DIAGNOSIS — C3490 Malignant neoplasm of unspecified part of unspecified bronchus or lung: Secondary | ICD-10-CM | POA: Insufficient documentation

## 2021-10-17 DIAGNOSIS — E669 Obesity, unspecified: Secondary | ICD-10-CM | POA: Diagnosis not present

## 2021-10-17 DIAGNOSIS — Z9049 Acquired absence of other specified parts of digestive tract: Secondary | ICD-10-CM | POA: Insufficient documentation

## 2021-10-17 DIAGNOSIS — M2550 Pain in unspecified joint: Secondary | ICD-10-CM | POA: Diagnosis not present

## 2021-10-17 DIAGNOSIS — Z79899 Other long term (current) drug therapy: Secondary | ICD-10-CM | POA: Insufficient documentation

## 2021-10-17 DIAGNOSIS — Z8269 Family history of other diseases of the musculoskeletal system and connective tissue: Secondary | ICD-10-CM | POA: Insufficient documentation

## 2021-10-17 DIAGNOSIS — Z803 Family history of malignant neoplasm of breast: Secondary | ICD-10-CM | POA: Diagnosis not present

## 2021-10-17 DIAGNOSIS — Z8249 Family history of ischemic heart disease and other diseases of the circulatory system: Secondary | ICD-10-CM | POA: Insufficient documentation

## 2021-10-17 DIAGNOSIS — Z8261 Family history of arthritis: Secondary | ICD-10-CM | POA: Insufficient documentation

## 2021-10-17 DIAGNOSIS — J439 Emphysema, unspecified: Secondary | ICD-10-CM | POA: Diagnosis not present

## 2021-10-17 LAB — CBC WITH DIFFERENTIAL/PLATELET
Abs Immature Granulocytes: 0.03 10*3/uL (ref 0.00–0.07)
Basophils Absolute: 0.1 10*3/uL (ref 0.0–0.1)
Basophils Relative: 1 %
Eosinophils Absolute: 0.1 10*3/uL (ref 0.0–0.5)
Eosinophils Relative: 1 %
HCT: 42.3 % (ref 36.0–46.0)
Hemoglobin: 13.6 g/dL (ref 12.0–15.0)
Immature Granulocytes: 0 %
Lymphocytes Relative: 18 %
Lymphs Abs: 1.6 10*3/uL (ref 0.7–4.0)
MCH: 30.5 pg (ref 26.0–34.0)
MCHC: 32.2 g/dL (ref 30.0–36.0)
MCV: 94.8 fL (ref 80.0–100.0)
Monocytes Absolute: 0.8 10*3/uL (ref 0.1–1.0)
Monocytes Relative: 9 %
Neutro Abs: 6.4 10*3/uL (ref 1.7–7.7)
Neutrophils Relative %: 71 %
Platelets: 284 10*3/uL (ref 150–400)
RBC: 4.46 MIL/uL (ref 3.87–5.11)
RDW: 14.4 % (ref 11.5–15.5)
WBC: 8.9 10*3/uL (ref 4.0–10.5)
nRBC: 0 % (ref 0.0–0.2)

## 2021-10-17 LAB — COMPREHENSIVE METABOLIC PANEL
ALT: 17 U/L (ref 0–44)
AST: 24 U/L (ref 15–41)
Albumin: 3.8 g/dL (ref 3.5–5.0)
Alkaline Phosphatase: 65 U/L (ref 38–126)
Anion gap: 6 (ref 5–15)
BUN: 11 mg/dL (ref 8–23)
CO2: 33 mmol/L — ABNORMAL HIGH (ref 22–32)
Calcium: 10 mg/dL (ref 8.9–10.3)
Chloride: 99 mmol/L (ref 98–111)
Creatinine, Ser: 0.64 mg/dL (ref 0.44–1.00)
GFR, Estimated: >60 mL/min (ref >60)
Glucose, Bld: 133 mg/dL — ABNORMAL HIGH (ref 70–99)
Potassium: 4 mmol/L (ref 3.5–5.1)
Sodium: 138 mmol/L (ref 135–145)
Total Bilirubin: 1 mg/dL (ref 0.3–1.2)
Total Protein: 7.5 g/dL (ref 6.5–8.1)

## 2021-10-17 NOTE — Therapy (Signed)
[facility], [place], [address] Phone: [phone]   Fax:  [phone]  Patient Details  Name: Laura Abbott MRN: [id number] Date of Birth: 01/04/1946 Referring Provider:  No ref. provider found  Encounter Date: 10/17/2021  PHYSICAL THERAPY DISCHARGE SUMMARY  Visits from Start of Care: 2  Current functional level related to goals / functional outcomes: NA   Remaining deficits: Na   Education / Equipment: Patient DC per request   Patient agrees to discharge. Patient goals were not met. Patient is being discharged due to financial reasons.  3:07 PM, 10/17/21 Josue Hector PT DPT  Physical Therapist with [facility]  [phone] [place], [place], [address] Phone: [phone]   Fax:  [phone]

## 2021-10-23 NOTE — Progress Notes (Signed)
Laura Abbott, [place] [address]   CLINIC:  Medical Oncology/Hematology  PCP:  [place], MD [address] / Laura Abbott [place] [address] [phone]   REASON FOR VISIT:  Follow-up for left neuroendocrine lung cancer  PRIOR THERAPY:  1. Left upper lobectomy on 12/15/2018. 2. Carboplatin and etoposide x 4 cycles from 01/20/2019 to 03/23/2019.  NGS Results:  not done  CURRENT THERAPY: surveillance  BRIEF ONCOLOGIC HISTORY:  Oncology History  DCIS (ductal carcinoma in situ) of breast  10/28/2013 Initial Diagnosis   DCIS (ductal carcinoma in situ) of breast   12/21/2018 Genetic Testing   Negative genetic testing on the multicancer panel.  The Multi-Gene Panel offered by [facility] includes sequencing and/or deletion duplication testing of the following 84 genes: AIP, ALK, APC, ATM, AXIN2,BAP1,  BARD1, BLM, BMPR1A, BRCA1, BRCA2, BRIP1, CASR, CDC73, CDH1, CDK4, CDKN1B, CDKN1C, CDKN2A (p14ARF), CDKN2A (p16INK4a), CEBPA, CHEK2, CTNNA1, DICER1, DIS3L2, EGFR (c.2369C>T, p.Thr790Met variant only), EPCAM (Deletion/duplication testing only), FH, FLCN, GATA2, GPC3, GREM1 (Promoter region deletion/duplication testing only), HOXB13 (c.251G>A, p.Gly84Glu), HRAS, KIT, MAX, MEN1, MET, MITF (c.952G>A, p.Glu318Lys variant only), MLH1, MSH2, MSH3, MSH6, MUTYH, NBN, NF1, NF2, NTHL1, PALB2, PDGFRA, PHOX2B, PMS2, POLD1, POLE, POT1, PRKAR1A, PTCH1, PTEN, RAD50, RAD51C, RAD51D, RB1, RECQL4, RET, RUNX1, SDHAF2, SDHA (sequence changes only), SDHB, SDHC, SDHD, SMAD4, SMARCA4, SMARCB1, SMARCE1, STK11, SUFU, TERC, TERT, TMEM127, TP53, TSC1, TSC2, VHL, WRN and WT1.  The report date is 12/21/2018.   Lung cancer ([place])  12/15/2018 Initial Diagnosis   Lung cancer ([facility])   12/21/2018 Genetic Testing   Negative genetic testing on the multicancer panel.  The Multi-Gene Panel offered by [facility] includes sequencing and/or deletion duplication testing of the following 84 genes: AIP, ALK, APC,  ATM, AXIN2,BAP1,  BARD1, BLM, BMPR1A, BRCA1, BRCA2, BRIP1, CASR, CDC73, CDH1, CDK4, CDKN1B, CDKN1C, CDKN2A (p14ARF), CDKN2A (p16INK4a), CEBPA, CHEK2, CTNNA1, DICER1, DIS3L2, EGFR (c.2369C>T, p.Thr790Met variant only), EPCAM (Deletion/duplication testing only), FH, FLCN, GATA2, GPC3, GREM1 (Promoter region deletion/duplication testing only), HOXB13 (c.251G>A, p.Gly84Glu), HRAS, KIT, MAX, MEN1, MET, MITF (c.952G>A, p.Glu318Lys variant only), MLH1, MSH2, MSH3, MSH6, MUTYH, NBN, NF1, NF2, NTHL1, PALB2, PDGFRA, PHOX2B, PMS2, POLD1, POLE, POT1, PRKAR1A, PTCH1, PTEN, RAD50, RAD51C, RAD51D, RB1, RECQL4, RET, RUNX1, SDHAF2, SDHA (sequence changes only), SDHB, SDHC, SDHD, SMAD4, SMARCA4, SMARCB1, SMARCE1, STK11, SUFU, TERC, TERT, TMEM127, TP53, TSC1, TSC2, VHL, WRN and WT1.  The report date is 12/21/2018.   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)   12/21/2018 Genetic Testing   Negative genetic testing on the multicancer panel.  The Multi-Gene Panel offered by [facility] includes sequencing and/or deletion duplication testing of the following 84 genes: AIP, ALK, APC, ATM, AXIN2,BAP1,  BARD1, BLM, BMPR1A, BRCA1, BRCA2, BRIP1, CASR, CDC73, CDH1, CDK4, CDKN1B, CDKN1C, CDKN2A (p14ARF), CDKN2A (p16INK4a), CEBPA, CHEK2, CTNNA1, DICER1, DIS3L2, EGFR (c.2369C>T, p.Thr790Met variant only), EPCAM (Deletion/duplication testing only), FH, FLCN, GATA2, GPC3, GREM1 (Promoter region deletion/duplication testing only), HOXB13 (c.251G>A, p.Gly84Glu), HRAS, KIT, MAX, MEN1, MET, MITF (c.952G>A, p.Glu318Lys variant only), MLH1, MSH2, MSH3, MSH6, MUTYH, NBN, NF1, NF2, NTHL1, PALB2, PDGFRA, PHOX2B, PMS2, POLD1, POLE, POT1, PRKAR1A, PTCH1, PTEN, RAD50, RAD51C, RAD51D, RB1, RECQL4, RET, RUNX1, SDHAF2, SDHA (sequence changes only), SDHB, SDHC, SDHD, SMAD4, SMARCA4, SMARCB1, SMARCE1, STK11, SUFU, TERC, TERT, TMEM127, TP53, TSC1, TSC2, VHL, WRN and WT1.  The report date  is 12/21/2018.   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[facility])  01/12/2019 Initial Diagnosis   Small cell lung  cancer ([facility])   01/20/2019 -  Chemotherapy   The patient had palonosetron (ALOXI) injection 0.25 mg, 0.25 mg, Intravenous,  Once, 4 of 4 cycles Administration: 0.25 mg (01/20/2019), 0.25 mg (02/10/2019), 0.25 mg (03/02/2019), 0.25 mg (03/23/2019) pegfilgrastim-cbqv (UDENYCA) injection 6 mg, 6 mg, Subcutaneous, Once, 4 of 4 cycles Administration: 6 mg (01/23/2019), 6 mg (02/13/2019), 6 mg (03/05/2019), 6 mg (03/26/2019) CARBOplatin (PARAPLATIN) 500 mg in sodium chloride 0.9 % 250 mL chemo infusion, 500 mg (100 % of original dose 504.5 mg), Intravenous,  Once, 4 of 4 cycles Dose modification:   (original dose 504.5 mg, Cycle 1),   (original dose 504.5 mg, Cycle 2),   (original dose 504.5 mg, Cycle 3),   (original dose 504.5 mg, Cycle 4) Administration: 500 mg (01/20/2019), 500 mg (02/10/2019), 500 mg (03/02/2019), 500 mg (03/23/2019) etoposide (VEPESID) 200 mg in sodium chloride 0.9 % 500 mL chemo infusion, 100 mg/m2 = 200 mg, Intravenous,  Once, 4 of 4 cycles Administration: 200 mg (01/20/2019), 200 mg (01/21/2019), 200 mg (01/22/2019), 200 mg (02/10/2019), 200 mg (02/11/2019), 200 mg (02/12/2019), 200 mg (03/02/2019), 200 mg (03/03/2019), 200 mg (03/04/2019), 200 mg (03/23/2019), 200 mg (03/24/2019), 200 mg (03/25/2019)   for chemotherapy treatment.       CANCER STAGING: Cancer Staging  DCIS (ductal carcinoma in situ) of breast Staging form: Breast, AJCC 7th Edition - Clinical stage from 12/01/2013: Stage 0 (Tis (DCIS), N0, cM0) - Signed by Laura Lark, MD on 12/01/2013 - Pathologic: Stage 0 (Tis (DCIS), N0, cM0) - Signed by Laura Lark, MD on 12/01/2013   INTERVAL HISTORY:  Laura Abbott, a 75 y.o. female, returns for routine follow-up of her  left neuroendocrine lung cancer. Laura Abbott was last seen on 04/18/2021.   Today she reports feeling good. She reports fatigue.   REVIEW OF SYSTEMS:  Review of Systems  Constitutional:   Positive for appetite change (35%) and fatigue (40%).  HENT:   Positive for trouble swallowing.   Genitourinary:  Positive for frequency.   Musculoskeletal:  Positive for arthralgias (6/10 knees) and back pain (6/10).  Neurological:  Positive for dizziness, headaches and numbness.  Psychiatric/Behavioral:  Positive for depression and sleep disturbance.   All other systems reviewed and are negative.  PAST MEDICAL/SURGICAL HISTORY:  Past Medical History:  Diagnosis Date   Anxiety    Arthritis    Asthma    Breast cancer ([facility])    breast, right DCIS   COPD (chronic obstructive pulmonary disease) ([facility])    COPD exacerbation ([facility]) 01/03/2014   With hypoxia   Depression    Emphysema of lung ([facility])    Family history of breast cancer    Family history of colon cancer    Family history of kidney cancer    Family history of lung cancer    Family history of pancreatic cancer    GERD (gastroesophageal reflux disease)    History of kidney stones    Hyperlipidemia    Hypertension    Insomnia    Lung cancer ([facility])    Obesity 01/05/2014   Osteoporosis    Osteoporosis, unspecified 12/01/2013   Personal history of radiation therapy    rt breast   Pneumonia    x2   Tobacco abuse 12/01/2013   Ulnar nerve impingement, left    Past Surgical History:  Procedure Laterality Date   ABDOMINAL HYSTERECTOMY     BLADDER SURGERY     had tacked   BREAST LUMPECTOMY Right    BREAST LUMPECTOMY WITH  NEEDLE LOCALIZATION AND AXILLARY SENTINEL LYMPH NODE BX Right 11/09/2013   Procedure: BREAST LUMPECTOMY WITH NEEDLE LOCALIZATION AND AXILLARY SENTINEL LYMPH NODE BIOPSY;  Surgeon: Harl Bowie, MD;  Location: [facility];  Service: General;  Laterality: Right;   BREAST SURGERY Right    lumpectomy   CHOLECYSTECTOMY     NODE DISSECTION Left 12/15/2018   Procedure: NODE DISSECTION LEFT LUNG;  Surgeon: Melrose Nakayama, MD;  Location: [facility];  Service: Thoracic;  Laterality: Left;   PORTACATH PLACEMENT Right  01/19/2019   Procedure: INSERTION PORT-A-CATH;  Surgeon: Aviva Signs, MD;  Location: [facility];  Service: General;  Laterality: Right;   VIDEO ASSISTED THORACOSCOPY (VATS)/ LOBECTOMY Left 12/15/2018   Procedure: VIDEO ASSISTED THORACOSCOPY (VATS)/LEFT UPPER LOBECTOMY;  Surgeon: Melrose Nakayama, MD;  Location: [facility];  Service: Thoracic;  Laterality: Left;    SOCIAL HISTORY:  Social History   Socioeconomic History   Marital status: Married    Spouse name: Not on file   Number of children: 3   Years of education: Not on file   Highest education level: Not on file  Occupational History   Occupation: Database administrator    Comment: Engineering geologist, retired    Comment: [place]: [employer]  Tobacco Use   Smoking status: Former    Packs/day: 0.50    Years: 43.00    Pack years: 21.50    Types: Cigarettes    Quit date: 10/24/2018    Years since quitting: 3.0   Smokeless tobacco: Never  Vaping Use   Vaping Use: Never used  Substance and Sexual Activity   Alcohol use: Never   Drug use: No   Sexual activity: Not Currently    Birth control/protection: Surgical    Comment: hyst  Other Topics Concern   Not on file  Social History Narrative   Lives with husband   Caffeine-- coffee 2 c daily   Social Determinants of Health   Financial Resource Strain: Not on file  Food Insecurity: No Food Insecurity   Worried About Charity fundraiser in the Last Year: Never true   Arboriculturist in the Last Year: Never true  Transportation Needs: Not on file  Physical Activity: Not on file  Stress: Not on file  Social Connections: Not on file  Intimate Partner Violence: Not At Risk   Fear of Current or Ex-Partner: No   Emotionally Abused: No   Physically Abused: No   Sexually Abused: No    FAMILY HISTORY:  Family History  Problem Relation Age of Onset   Lung cancer Mother 41       non smoker   Rheum arthritis Mother    Pancreatic cancer Father 60       d. 50   Leukemia  Sister        dx in her 57s   Heart disease Sister    Colon cancer Sister        dx and died in her 61s   Heart disease Sister    Lung cancer Maternal Aunt        non-smoker   Heart disease Sister    Breast cancer Sister        dx early 52s   Heart disease Brother    Cirrhosis Brother        d. 33   Aneurysm Brother        d. 25s   Fibromyalgia Daughter    Kidney cancer Nephew  34   Kidney cancer Nephew        dx in 72s; great nephew   Leukemia Nephew    Liver disease Nephew    Other Niece        white matter on brain; dx late 14s   Leukemia Nephew     CURRENT MEDICATIONS:  Current Outpatient Medications  Medication Sig Dispense Refill   albuterol (PROVENTIL HFA;VENTOLIN HFA) 108 (90 BASE) MCG/ACT inhaler Inhale 2 puffs into the lungs 3 (three) times daily. Also use the inhaler every 2 hours if needed for worsening shortness of breath and wheezing. 1 Inhaler 3   BOOSTRIX 5-2.5-18.5 LF-MCG/0.5 injection      Calcium 600-200 MG-UNIT per tablet Take 1 tablet by mouth daily.     clonazePAM (KLONOPIN) 0.5 MG tablet Take 0.5 mg by mouth at bedtime.     FLUoxetine (PROZAC) 40 MG capsule Take 40 mg by mouth daily.     fluticasone (FLONASE) 50 MCG/ACT nasal spray Place 2 sprays into both nostrils daily.     furosemide (LASIX) 20 MG tablet Take 20 mg by mouth as needed.     KENALOG 40 MG/ML injection Inject 2 mLs into the muscle once. (Patient not taking: Reported on 07/19/2021)     ketorolac (TORADOL) 60 MG/2ML SOLN injection Inject 60 mg into the muscle once. (Patient not taking: Reported on 07/19/2021)     lisinopril (ZESTRIL) 10 MG tablet  (Patient not taking: Reported on 07/19/2021)     lisinopril (ZESTRIL) 20 MG tablet Take 1 tablet (20 mg total) by mouth daily. 90 tablet 1   meloxicam (MOBIC) 7.5 MG tablet  (Patient not taking: Reported on 07/19/2021)     metoprolol (LOPRESSOR) 50 MG tablet Take 50-75 mg by mouth 2 (two) times daily. Takes 75 mg in the morning and 50 mg in the  evening     mirabegron ER (MYRBETRIQ) 25 MG TB24 tablet Daily at bedtime 90 tablet 3   omeprazole (PRILOSEC) 40 MG capsule Take 40 mg by mouth daily.      SPIRIVA HANDIHALER 18 MCG inhalation capsule Place 1 capsule into inhaler and inhale daily at 6 (six) AM.     tiZANidine (ZANAFLEX) 4 MG tablet Take 2 mg by mouth 3 (three) times daily as needed.     traZODone (DESYREL) 50 MG tablet Take 50 mg by mouth at bedtime.     No current facility-administered medications for this visit.   Facility-Administered Medications Ordered in Other Visits  Medication Dose Route Frequency Provider Last Rate Last Admin   sodium chloride flush (NS) 0.9 % injection 10 mL  10 mL Intracatheter PRN Derek Jack, MD   10 mL at 02/10/19 1513    ALLERGIES:  No Known Allergies  PHYSICAL EXAM:  Performance status (ECOG): 1 - Symptomatic but completely ambulatory  There were no vitals filed for this visit. Wt Readings from Last 3 Encounters:  04/18/21 205 lb 9.6 oz (93.3 kg)  01/19/21 207 lb 3.2 oz (94 kg)  01/12/21 208 lb 12.8 oz (94.7 kg)   Physical Exam Vitals reviewed.  Constitutional:      Appearance: Normal appearance. She is obese.  Cardiovascular:     Rate and Rhythm: Normal rate and regular rhythm.     Pulses: Normal pulses.     Heart sounds: Normal heart sounds.  Pulmonary:     Effort: Pulmonary effort is normal.     Breath sounds: Normal breath sounds.  Neurological:     General: No focal  deficit present.     Mental Status: She is alert and oriented to person, place, and time.  Psychiatric:        Mood and Affect: Mood normal.        Behavior: Behavior normal.     LABORATORY DATA:  I have reviewed the labs as listed.  CBC Latest Ref Rng & Units 10/17/2021 04/11/2021 01/06/2021  WBC 4.0 - 10.5 K/uL 8.9 10.4 7.5  Hemoglobin 12.0 - 15.0 g/dL 13.6 12.7 12.9  Hematocrit 36.0 - 46.0 % 42.3 42.3 42.9  Platelets 150 - 400 K/uL 284 290 277   CMP Latest Ref Rng & Units 10/17/2021  04/11/2021 01/06/2021  Glucose 70 - 99 mg/dL 133(H) 107(H) 118(H)  BUN 8 - 23 mg/dL _0 Creatinine 0.44 - 1.00 mg/dL 0.64 0.59 0.52  Sodium 135 - 145 mmol/L 138 137 138  Potassium 3.5 - 5.1 mmol/L 4.0 4.4 4.2  Chloride 98 - 111 mmol/L 99 102 104  CO2 22 - 32 mmol/L 33(H) 30 27  Calcium 8.9 - 10.3 mg/dL 10.0 8.9 8.5(L)  Total Protein 6.5 - 8.1 g/dL 7.5 7.2 7.5  Total Bilirubin 0.3 - 1.2 mg/dL 1.0 0.9 0.6  Alkaline Phos 38 - 126 U/L 65 61 91  AST 15 - 41 U/L _1 ALT 0 - 44 U/L _2 DIAGNOSTIC IMAGING:  I have independently reviewed the scans and discussed with the patient. CT CHEST WO CONTRAST  Result Date: 10/18/2021 CLINICAL DATA:  Small cell lung cancer. Assess treatment response. Previous left upper lobectomy and chemotherapy. Remote history of right breast cancer. EXAM: CT CHEST WITHOUT CONTRAST TECHNIQUE: Multidetector CT imaging of the chest was performed following the standard protocol without IV contrast. COMPARISON:  CT chest 04/11/2021 and 01/06/2021. FINDINGS: Cardiovascular: Moderate atherosclerosis of the aorta, great vessels and coronary arteries. Right subclavian Port-A-Cath extends to the mid SVC. There are possible calcifications of the aortic valve. The heart size is normal. There is no pericardial effusion. Mediastinum/Nodes: There are no enlarged mediastinal, hilar or axillary lymph nodes.Hilar assessment is limited by the lack of intravenous contrast, although the hilar contours appear unchanged. The thyroid gland, trachea and esophagus demonstrate no significant findings. Lungs/Pleura: Status post left upper lobe resection. There is no pleural effusion or pneumothorax. Stable mild emphysematous changes and scattered subpleural reticulation. No suspicious pulmonary nodules. Upper abdomen: The visualized upper abdomen appears stable without suspicious findings. Previous cholecystectomy. No adrenal mass. Musculoskeletal/Chest wall: There is no chest wall mass  or suspicious osseous finding. Grossly stable calcifications within the right breast. Mild degenerative changes throughout the thoracic spine. IMPRESSION: 1. Stable postoperative chest status post left upper lobe resection. No evidence of local recurrence or metastatic disease. 2. Coronary and Aortic Atherosclerosis (ICD10-I70.0). Electronically Signed   By: Richardean Sale M.D.   On: 10/18/2021 16:07     ASSESSMENT:  1.  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  She was found to have an abnormal chest x-ray followed by CT scan which showed left upper lobe lung mass. - She smoked 1 to 2 packs of cigarettes per day for the last 50 years.  She quit smoking on 10/23/2018. - Denies any significant weight loss.  But has night sweats occasionally.  Denies any hemoptysis. - PET CT scan on 11/17/2018 showed left upper lobe lung mass, 4.9 cm, no evidence of adenopathy  or metastasis.  MRI of the brain was negative for intracranial mets. - Left upper lobectomy and lymph node biopsy on 12/15/2018.  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   -Cancer type ID testing shows 96% probability of neuroendocrine carcinoma (small cell/large cell carcinoma). -4 cycles of adjuvant chemotherapy with carboplatin and etoposide from 01/20/2019 through 03/23/2019. -CT of the chest on 08/25/2019 showed left upper lobectomy changes with tiny pulmonary nodules unchanged. -CT chest with contrast on 01/06/2021 with stable surgical changes in the left upper lobectomy.  New 22 mm right middle lobe density, likely an area of infection/inflammation.  No adenopathy.   2.  Family history: -Mother had lung cancer and was never smoker.  Father had pancreatic cancer. -1 sister has colon cancer in 1 sister with leukemia.  Another sister had breast  cancer. -[facility] testing on 12/09/2018 was negative for hereditary malignancies.   PLAN:  1.  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 - She does not report any changes in her cough or hemoptysis.  No chest pains reported. - Reviewed labs from last week which showed normal CBC and LFTs. - Tiredness is stable.  Recommend RTC 6 months with repeat scan and labs.     Orders placed this encounter:  No orders of the defined types were placed in this encounter.    Derek Jack, MD [facility] [phone]   I, Thana Ates, am acting as a scribe for Dr. Derek Jack.  I, Derek Jack MD, have reviewed the above documentation for accuracy and completeness, and I agree with the above.

## 2021-10-24 ENCOUNTER — Inpatient Hospital Stay (HOSPITAL_COMMUNITY): Payer: Medicare HMO | Admitting: Hematology

## 2021-10-24 ENCOUNTER — Inpatient Hospital Stay (HOSPITAL_COMMUNITY): Payer: Medicare HMO

## 2021-10-24 ENCOUNTER — Other Ambulatory Visit: Payer: Self-pay

## 2021-10-24 VITALS — BP 154/63 | HR 73 | Temp 96.9°F | Resp 18 | Wt 206.6 lb

## 2021-10-24 DIAGNOSIS — R5383 Other fatigue: Secondary | ICD-10-CM | POA: Diagnosis not present

## 2021-10-24 DIAGNOSIS — M549 Dorsalgia, unspecified: Secondary | ICD-10-CM | POA: Diagnosis not present

## 2021-10-24 DIAGNOSIS — R519 Headache, unspecified: Secondary | ICD-10-CM | POA: Diagnosis not present

## 2021-10-24 DIAGNOSIS — D0510 Intraductal carcinoma in situ of unspecified breast: Secondary | ICD-10-CM | POA: Diagnosis not present

## 2021-10-24 DIAGNOSIS — G479 Sleep disorder, unspecified: Secondary | ICD-10-CM | POA: Diagnosis not present

## 2021-10-24 DIAGNOSIS — R42 Dizziness and giddiness: Secondary | ICD-10-CM | POA: Diagnosis not present

## 2021-10-24 DIAGNOSIS — C3490 Malignant neoplasm of unspecified part of unspecified bronchus or lung: Secondary | ICD-10-CM | POA: Diagnosis not present

## 2021-10-24 DIAGNOSIS — M2550 Pain in unspecified joint: Secondary | ICD-10-CM | POA: Diagnosis not present

## 2021-10-24 DIAGNOSIS — Z452 Encounter for adjustment and management of vascular access device: Secondary | ICD-10-CM | POA: Diagnosis not present

## 2021-10-24 MED ORDER — SODIUM CHLORIDE 0.9% FLUSH
10.0000 mL | INTRAVENOUS | Status: DC | PRN
  Administered 2021-10-24: 10 mL via INTRAVENOUS

## 2021-10-24 MED ORDER — HEPARIN SOD (PORK) LOCK FLUSH 100 UNIT/ML IV SOLN
500.0000 [IU] | Freq: Once | INTRAVENOUS | Status: AC
  Administered 2021-10-24: 500 [IU] via INTRAVENOUS

## 2021-10-24 NOTE — Patient Instructions (Signed)
[facility]  Discharge Instructions: Thank you for choosing [facility] to provide your oncology and hematology care.  If you have a lab appointment with the [facility], please come in thru the Main Entrance and check in at the main information desk.  Wear comfortable clothing and clothing appropriate for easy access to any Portacath or PICC line.   We strive to give you quality time with your provider. You may need to reschedule your appointment if you arrive late (15 or more minutes).  Arriving late affects you and other patients whose appointments are after yours.  Also, if you miss three or more appointments without notifying the office, you may be dismissed from the clinic at the provider's discretion.      For prescription refill requests, have your pharmacy contact our office and allow 72 hours for refills to be completed.    Today you received the following chemotherapy and/or immunotherapy agents port flush      To help prevent nausea and vomiting after your treatment, we encourage you to take your nausea medication as directed.  BELOW ARE SYMPTOMS THAT SHOULD BE REPORTED IMMEDIATELY: *FEVER GREATER THAN 100.4 F (38 C) OR HIGHER *CHILLS OR SWEATING *NAUSEA AND VOMITING THAT IS NOT CONTROLLED WITH YOUR NAUSEA MEDICATION *UNUSUAL SHORTNESS OF BREATH *UNUSUAL BRUISING OR BLEEDING *URINARY PROBLEMS (pain or burning when urinating, or frequent urination) *BOWEL PROBLEMS (unusual diarrhea, constipation, pain near the anus) TENDERNESS IN MOUTH AND THROAT WITH OR WITHOUT PRESENCE OF ULCERS (sore throat, sores in mouth, or a toothache) UNUSUAL RASH, SWELLING OR PAIN  UNUSUAL VAGINAL DISCHARGE OR ITCHING   Items with * indicate a potential emergency and should be followed up as soon as possible or go to the Emergency Department if any problems should occur.  Please show the CHEMOTHERAPY ALERT CARD or IMMUNOTHERAPY ALERT CARD at check-in to the Emergency  Department and triage nurse.  Should you have questions after your visit or need to cancel or reschedule your appointment, please contact [facility] [phone]  and follow the prompts.  Office hours are 8:00 a.m. to 4:30 p.m. Monday - Friday. Please note that voicemails left after 4:00 p.m. may not be returned until the following business day.  We are closed weekends and major holidays. You have access to a nurse at all times for urgent questions. Please call the main number to the clinic [phone] and follow the prompts.  For any non-urgent questions, you may also contact your provider using MyChart. We now offer e-Visits for anyone 50 and older to request care online for non-urgent symptoms. For details visit [email or web address].   Also download the MyChart app! Go to the app store, search "MyChart", open the app, select [facility], and log in with your MyChart username and password.  Due to Covid, a mask is required upon entering the hospital/clinic. If you do not have a mask, one will be given to you upon arrival. For doctor visits, patients may have 1 support person aged 24 or older with them. For treatment visits, patients cannot have anyone with them due to current Covid guidelines and our immunocompromised population.

## 2021-10-24 NOTE — Progress Notes (Signed)
Laura Abbott presented for Portacath access and flush.  Portacath located right chest wall accessed with  H 20 needle.  Good blood return present. Portacath flushed with 31ml NS and 500U/31ml Heparin and needle removed intact.  Procedure tolerated well and without incident.   Stable during and after procedure.  AVS reviewed.  Discharged in stable condition ambulatory.

## 2021-10-24 NOTE — Patient Instructions (Signed)
[place] at [facility] Discharge Instructions  You were seen today by Dr. Delton Coombes. He went over your recent results. You will be scheduled for a CT scan of your chest prior to your next appointment. Dr. Delton Coombes will see you back in 6 months for labs and follow up.   Thank you for choosing [place] at [facility] to provide your oncology and hematology care.  To afford each patient quality time with our provider, please arrive at least 15 minutes before your scheduled appointment time.   If you have a lab appointment with the [facility] please come in thru the Main Entrance and check in at the main information desk  You need to re-schedule your appointment should you arrive 10 or more minutes late.  We strive to give you quality time with our providers, and arriving late affects you and other patients whose appointments are after yours.  Also, if you no show three or more times for appointments you may be dismissed from the clinic at the providers discretion.     Again, thank you for choosing [facility].  Our hope is that these requests will decrease the amount of time that you wait before being seen by our physicians.       _____________________________________________________________  Should you have questions after your visit to [facility], please contact our office at [phone] between the hours of 8:00 a.m. and 4:30 p.m.  Voicemails left after 4:00 p.m. will not be returned until the following business day.  For prescription refill requests, have your pharmacy contact our office and allow 72 hours.    Cancer Center Support Programs:   > Cancer Support Group  2nd Tuesday of the month 1pm-2pm, Journey Room

## 2021-11-29 DIAGNOSIS — J440 Chronic obstructive pulmonary disease with acute lower respiratory infection: Secondary | ICD-10-CM | POA: Diagnosis not present

## 2021-11-29 DIAGNOSIS — Z6836 Body mass index (BMI) 36.0-36.9, adult: Secondary | ICD-10-CM | POA: Diagnosis not present

## 2021-11-29 DIAGNOSIS — G5620 Lesion of ulnar nerve, unspecified upper limb: Secondary | ICD-10-CM | POA: Diagnosis not present

## 2021-11-29 DIAGNOSIS — Z85118 Personal history of other malignant neoplasm of bronchus and lung: Secondary | ICD-10-CM | POA: Diagnosis not present

## 2021-11-29 DIAGNOSIS — F339 Major depressive disorder, recurrent, unspecified: Secondary | ICD-10-CM | POA: Diagnosis not present

## 2021-11-29 DIAGNOSIS — M810 Age-related osteoporosis without current pathological fracture: Secondary | ICD-10-CM | POA: Diagnosis not present

## 2021-11-29 DIAGNOSIS — E7801 Familial hypercholesterolemia: Secondary | ICD-10-CM | POA: Diagnosis not present

## 2021-11-29 DIAGNOSIS — M549 Dorsalgia, unspecified: Secondary | ICD-10-CM | POA: Diagnosis not present

## 2021-12-04 DIAGNOSIS — E7801 Familial hypercholesterolemia: Secondary | ICD-10-CM | POA: Diagnosis not present

## 2021-12-04 DIAGNOSIS — Z85118 Personal history of other malignant neoplasm of bronchus and lung: Secondary | ICD-10-CM | POA: Diagnosis not present

## 2021-12-04 DIAGNOSIS — E7800 Pure hypercholesterolemia, unspecified: Secondary | ICD-10-CM | POA: Diagnosis not present

## 2021-12-04 DIAGNOSIS — J440 Chronic obstructive pulmonary disease with acute lower respiratory infection: Secondary | ICD-10-CM | POA: Diagnosis not present

## 2021-12-10 ENCOUNTER — Telehealth: Payer: Self-pay | Admitting: Physician Assistant

## 2021-12-10 NOTE — Telephone Encounter (Signed)
Pt stated she had some chest pain, mid chest, pressure, 8/10. She was diaphoretic. Started while she was in the grocery store. No N&V.   She has had similar sx before, but this is a little different.  She thought it was indigestion, had some burping.  No meds tried, pain resolved in about an hour.   Pt refuses to go to hospital, willing to come to the office.  Advised pt I would message the office to call her.  Please arrange ASAP appt for eval.  Thanks Rosaria Ferries, PA-C 12/10/2021 4:22 PM

## 2021-12-11 NOTE — Telephone Encounter (Signed)
Forwarded to H.Ferdinand Lango, front office to schedule APP appointment.

## 2021-12-13 ENCOUNTER — Other Ambulatory Visit (HOSPITAL_COMMUNITY): Payer: Self-pay | Admitting: Internal Medicine

## 2021-12-13 DIAGNOSIS — Z1231 Encounter for screening mammogram for malignant neoplasm of breast: Secondary | ICD-10-CM

## 2021-12-24 DIAGNOSIS — J440 Chronic obstructive pulmonary disease with acute lower respiratory infection: Secondary | ICD-10-CM | POA: Diagnosis not present

## 2021-12-24 DIAGNOSIS — F339 Major depressive disorder, recurrent, unspecified: Secondary | ICD-10-CM | POA: Diagnosis not present

## 2021-12-24 DIAGNOSIS — M549 Dorsalgia, unspecified: Secondary | ICD-10-CM | POA: Diagnosis not present

## 2021-12-28 ENCOUNTER — Ambulatory Visit: Payer: Medicare HMO | Admitting: Student

## 2021-12-28 ENCOUNTER — Other Ambulatory Visit: Payer: Self-pay

## 2021-12-28 ENCOUNTER — Encounter: Payer: Self-pay | Admitting: Student

## 2021-12-28 VITALS — BP 120/64 | HR 64 | Ht 63.5 in | Wt 205.0 lb

## 2021-12-28 DIAGNOSIS — R0609 Other forms of dyspnea: Secondary | ICD-10-CM | POA: Diagnosis not present

## 2021-12-28 DIAGNOSIS — J440 Chronic obstructive pulmonary disease with acute lower respiratory infection: Secondary | ICD-10-CM | POA: Diagnosis not present

## 2021-12-28 DIAGNOSIS — E785 Hyperlipidemia, unspecified: Secondary | ICD-10-CM | POA: Diagnosis not present

## 2021-12-28 DIAGNOSIS — M549 Dorsalgia, unspecified: Secondary | ICD-10-CM | POA: Diagnosis not present

## 2021-12-28 DIAGNOSIS — E7801 Familial hypercholesterolemia: Secondary | ICD-10-CM | POA: Diagnosis not present

## 2021-12-28 DIAGNOSIS — G5620 Lesion of ulnar nerve, unspecified upper limb: Secondary | ICD-10-CM | POA: Diagnosis not present

## 2021-12-28 DIAGNOSIS — I10 Essential (primary) hypertension: Secondary | ICD-10-CM | POA: Diagnosis not present

## 2021-12-28 DIAGNOSIS — M810 Age-related osteoporosis without current pathological fracture: Secondary | ICD-10-CM | POA: Diagnosis not present

## 2021-12-28 DIAGNOSIS — R079 Chest pain, unspecified: Secondary | ICD-10-CM | POA: Diagnosis not present

## 2021-12-28 DIAGNOSIS — F339 Major depressive disorder, recurrent, unspecified: Secondary | ICD-10-CM | POA: Diagnosis not present

## 2021-12-28 DIAGNOSIS — Z6837 Body mass index (BMI) 37.0-37.9, adult: Secondary | ICD-10-CM | POA: Diagnosis not present

## 2021-12-28 DIAGNOSIS — Z85118 Personal history of other malignant neoplasm of bronchus and lung: Secondary | ICD-10-CM | POA: Diagnosis not present

## 2021-12-28 MED ORDER — NITROGLYCERIN 0.4 MG SL SUBL: 0.4000 mg | SUBLINGUAL_TABLET | SUBLINGUAL | 3 refills | Status: AC | PRN

## 2021-12-28 MED ORDER — ASPIRIN EC 81 MG PO TBEC: 81.0000 mg | DELAYED_RELEASE_TABLET | Freq: Every day | ORAL | 3 refills | Status: DC

## 2021-12-28 NOTE — Progress Notes (Signed)
Cardiology Office Note    Date:  12/28/2021   ID:  [name], DOB 1946-02-26, MRN [id number]  PCP:  Corder Nation, MD  Cardiologist: Carlyle Dolly, MD    Chief Complaint  Patient presents with   Follow-up    Recent chest pain    History of Present Illness:    Laura Abbott is a 76 y.o. female with past medical history of chest pain (NST in 12/2020 showing possible small apical infarct but no current ischemia), HTN, GERD, COPD and history of lung cancer (s/p LUL lobectomy and chemo) who presents to the office today for evaluation of chest pain.   She was last examined by Dr. Harl Bowie in 12/2020 and reported still having intermittent episodes of sharp chest pain which would last for a few minutes and spontaneously resolve. Given her recent reassuring echo and stress test, further cardiac testing was not pursued. BP was elevated and Lisinopril was titrated to 20mg  daily.   She called the after-hours line on 12/10/2021 reporting chest pain while at the grocery store which lasted for an hour and then resolved. ED evaluation was recommended but she declined, therefore follow-up was arranged.   In talking with the patient today, she reports having intermittent episodes of chest pain at least 2-3 times each month which she describes as significant discomfort along her left pectoral region and she experiences diaphoresis when this occurs. Reports her episode last month lasted for over an hour and gradually improved with rest and resolved on its own. She does report having baseline dyspnea on exertion in the setting of COPD but no specific orthopnea, PND or pitting edema.  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, right DCIS   COPD (chronic obstructive pulmonary disease) ([place])    COPD exacerbation ([place]) 01/03/2014   With hypoxia   Depression    Emphysema of lung ([place])    Family history of breast cancer    Family history  of colon cancer    Family history of kidney cancer    Family history of lung cancer    Family history of pancreatic cancer    GERD (gastroesophageal reflux disease)    History of kidney stones    Hyperlipidemia    Hypertension    Insomnia    Lung cancer ([place])    Obesity 01/05/2014   Osteoporosis    Osteoporosis, unspecified 12/01/2013   Personal history of radiation therapy    rt breast   Pneumonia    x2   Tobacco abuse 12/01/2013   Ulnar nerve impingement, left     Past Surgical History:  Procedure Laterality Date   ABDOMINAL HYSTERECTOMY     BLADDER SURGERY     had tacked   BREAST LUMPECTOMY Right    BREAST LUMPECTOMY WITH NEEDLE LOCALIZATION AND AXILLARY SENTINEL LYMPH NODE BX Right 11/09/2013   Procedure: BREAST LUMPECTOMY WITH NEEDLE LOCALIZATION AND AXILLARY SENTINEL LYMPH NODE BIOPSY;  Surgeon: Harl Bowie, MD;  Location: [facility];  Service: General;  Laterality: Right;   BREAST SURGERY Right    lumpectomy   CHOLECYSTECTOMY     NODE DISSECTION Left 12/15/2018   Procedure: NODE DISSECTION LEFT LUNG;  Surgeon: Melrose Nakayama, MD;  Location: [facility];  Service: Thoracic;  Laterality: Left;   PORTACATH PLACEMENT Right 01/19/2019   Procedure: INSERTION PORT-A-CATH;  Surgeon: Aviva Signs, MD;  Location: [facility];  Service: General;  Laterality: Right;   VIDEO ASSISTED THORACOSCOPY (VATS)/ LOBECTOMY Left 12/15/2018   Procedure: VIDEO ASSISTED THORACOSCOPY (VATS)/LEFT UPPER LOBECTOMY;  Surgeon: Melrose Nakayama, MD;  Location: [facility];  Service: Thoracic;  Laterality: Left;    Current Medications: Outpatient Medications Prior to Visit  Medication Sig Dispense Refill   albuterol (PROVENTIL HFA;VENTOLIN HFA) 108 (90 BASE) MCG/ACT inhaler Inhale 2 puffs into the lungs 3 (three) times daily. Also use the inhaler every 2 hours if needed for worsening shortness of breath and wheezing. 1 Inhaler 3   Calcium 600-200 MG-UNIT per tablet Take 1 tablet by mouth daily.      clonazePAM (KLONOPIN) 0.5 MG tablet Take 0.5 mg by mouth at bedtime.     FLUoxetine (PROZAC) 40 MG capsule Take 40 mg by mouth daily.     fluticasone (FLONASE) 50 MCG/ACT nasal spray Place 2 sprays into both nostrils daily.     furosemide (LASIX) 20 MG tablet Take 20 mg by mouth as needed.     gabapentin (NEURONTIN) 300 MG capsule Take 300 mg by mouth 2 (two) times daily.     lisinopril (ZESTRIL) 20 MG tablet Take 1 tablet (20 mg total) by mouth daily. 90 tablet 1   metoprolol (LOPRESSOR) 50 MG tablet Take 50-75 mg by mouth 2 (two) times daily. Takes 75 mg in the morning and 50 mg in the evening     omeprazole (PRILOSEC) 40 MG capsule Take 40 mg by mouth daily.      rosuvastatin (CRESTOR) 10 MG tablet Take 10 mg by mouth daily.     SPIRIVA HANDIHALER 18 MCG inhalation capsule Place 1 capsule into inhaler and inhale daily at 6 (six) AM.     tiZANidine (ZANAFLEX) 4 MG tablet Take 2 mg by mouth 3 (three) times daily as needed.     BOOSTRIX 5-2.5-18.5 LF-MCG/0.5 injection  (Patient not taking: Reported on 12/28/2021)     KENALOG 40 MG/ML injection Inject 2 mLs into the muscle once. (Patient not taking: Reported on 12/28/2021)     ketorolac (TORADOL) 60 MG/2ML SOLN injection Inject 60 mg into the muscle once. (Patient not taking: Reported on 12/28/2021)     lisinopril (ZESTRIL) 10 MG tablet  (Patient not taking: Reported on 12/28/2021)     meloxicam (MOBIC) 7.5 MG tablet  (Patient not taking: Reported on 12/28/2021)     mirabegron ER (MYRBETRIQ) 25 MG TB24 tablet Daily at bedtime (Patient not taking: Reported on 12/28/2021) 90 tablet 3   traMADol (ULTRAM) 50 MG tablet Take 50 mg by mouth 3 (three) times daily as needed. (Patient not taking: Reported on 12/28/2021)     traZODone (DESYREL) 50 MG tablet Take 50 mg by mouth at bedtime. (Patient not taking: Reported on 12/28/2021)     Facility-Administered Medications Prior to Visit  Medication Dose Route Frequency Provider Last Rate Last Admin   sodium chloride flush  (NS) 0.9 % injection 10 mL  10 mL Intracatheter PRN Derek Jack, MD   10 mL at 02/10/19 1513     Allergies:   Patient has no known allergies.   Social History   Socioeconomic History   Marital status: Married    Spouse name: Not on file   Number of children: 3   Years of education: Not on file   Highest education level: Not on file  Occupational History   Occupation: Database administrator    Comment: Engineering geologist, retired    Comment: [employer]  Tobacco Use   Smoking status: Former    Packs/day: 0.50    Years: 43.00    Pack years: 21.50    Types: Cigarettes    Quit date: 10/24/2018    Years since quitting: 3.1   Smokeless tobacco: Never  Vaping Use   Vaping Use: Never used  Substance and Sexual Activity   Alcohol use: Never   Drug use: No   Sexual activity: Not Currently    Birth control/protection: Surgical    Comment: hyst  Other Topics Concern   Not on file  Social History Narrative   Lives with husband   Caffeine-- coffee 2 c daily   Social Determinants of Health   Financial Resource Strain: Not on file  Food Insecurity: Not on file  Transportation Needs: Not on file  Physical Activity: Not on file  Stress: Not on file  Social Connections: Not on file     Family History:  The patient's family history includes Aneurysm in her brother; Breast cancer in her sister; Cirrhosis in her brother; Colon cancer in her sister; Fibromyalgia in her daughter; Heart disease in her brother, sister, sister, and sister; Kidney cancer in her nephew; Kidney cancer (age of onset: 11) in her nephew; Leukemia in her nephew, nephew, and sister; Liver disease in her nephew; Lung cancer in her maternal aunt; Lung cancer (age of onset: 71) in her mother; Other in her niece; Pancreatic cancer (age of onset: 21) in her father; Rheum arthritis in her mother.   Review of Systems:    Please see the history of present illness.     All other systems reviewed and  are otherwise negative except as noted above.   Physical Exam:    VS:  BP 120/64    Pulse 64    Ht 5' 3.5" (1.613 m)    Wt 205 lb (93 kg)    LMP  (Approximate)    SpO2 96%    BMI 35.74 kg/m    General: Well developed, well nourished,female appearing in no acute distress. Head: Normocephalic, atraumatic. Neck: No carotid bruits. JVD not elevated.  Lungs: Respirations regular and unlabored, without wheezes or rales.  Heart: Regular rate and rhythm. No S3 or S4.  No murmur, no rubs, or gallops appreciated. Abdomen: Appears non-distended. No obvious abdominal masses. Msk:  Strength and tone appear normal for age. No obvious joint deformities or effusions. Extremities: No clubbing or cyanosis. No pitting edema.  Distal pedal pulses are 2+ bilaterally. Neuro: Alert and oriented X 3. Moves all extremities spontaneously. No focal deficits noted. Psych:  Responds to questions appropriately with a normal affect. Skin: No rashes or lesions noted  Wt Readings from Last 3 Encounters:  12/28/21 205 lb (93 kg)  10/24/21 206 lb 9.6 oz (93.7 kg)  04/18/21 205 lb 9.6 oz (93.3 kg)     Studies/Labs Reviewed:   EKG:  EKG is ordered today.  The ekg ordered today demonstrates NSR, HR 68 with LAD but no acute ST changes.   Recent Labs: 10/17/2021: ALT 17; BUN 11; Creatinine, Ser 0.64; Hemoglobin 13.6; Platelets 284; Potassium 4.0; Sodium 138   Lipid Panel No results found for: CHOL, TRIG, HDL, CHOLHDL, VLDL, LDLCALC, LDLDIRECT  Additional studies/ records that were reviewed today include:   Echocardiogram: 12/2020 Summary    1. The left ventricle is normal in size with mildly increased wall  thickness.    2. The left ventricular systolic function is normal, LVEF is visually  estimated at  60-65%.    3. There is grade I diastolic dysfunction (impaired relaxation).    4. There is mild mitral valve regurgitation.    5. There is mild aortic regurgitation.    6. The left atrium is moderately dilated  in size.    7. The right ventricle is upper normal in size, with normal systolic  function.    8. There is moderate pulmonary hypertension, estimated pulmonary artery  systolic pressure is 51 mmHg.    9. The right atrium is mildly dilated  in size.   NST: 12/2020 1. No reversible ischemia. Small mild fixed defect involving the  left apex is noted on both stress and rest images and may represent  sequelae of prior infarct.   2. Mild apical hypokinesis..   3. Left ventricular ejection fraction 60%   4. Non invasive risk stratification*: Low   Assessment:    1. Chest pain, unspecified type   2. Dyspnea on exertion   3. Essential hypertension   4. Hyperlipidemia LDL goal <70      Plan:   In order of problems listed above:  1. Chest Pain with Mixed Features/Dyspnea on Exertion - She reports episodic chest pain as outlined above but this can last for over an hour at a time. Also reports dyspnea on exertion and she does have known COPD but feels like her dyspnea has progressed over the past few months.  - Given prior NST last year showing a possible prior infarct but no ischemia and known coronary calcification by prior Chest CT, will plan for a Coronary CT for further evaluation. Will provide with Rx for SL NTG and start ASA 81mg  daily. Continue BB and statin therapy.   2. HTN - Her BP is well-controlled at 120/64 during today's visit. Continue Lopressor and Lisinopril at current dosing.   3. HLD - Followed by her PCP and we will request a copy of her most recent labs. Goal LDL should be less than 70 with known coronary calcifications. Continue Crestor 10mg  daily.    Medication Adjustments/Labs and Tests Ordered: Current medicines are reviewed at length with the patient today.  Concerns regarding medicines are outlined above.  Medication changes, Labs and Tests ordered today are listed in the Patient Instructions below. Patient Instructions  Medication Instructions:   Take  Lopressor 100 mg 2 hours prior to CT Scan.  Nitro 0.4 mg for chest pain  Aspirin 81 mg Daily   *If you need a refill on your cardiac medications before your next appointment, please call your pharmacy*   Lab Work: Your physician recommends that you return for lab work in: 1 Week prior to CT scan.   If you have labs (blood work) drawn today and your tests are completely normal, you will receive your results only by: [place] (if you have MyChart) OR A paper copy in the mail If you have any lab test that is abnormal or we need to change your treatment, we will call you to review the results.   Testing/Procedures: Your physician has requested that you have cardiac CT. Cardiac computed tomography (CT) is a painless test that uses an x-ray machine to take clear, detailed pictures of your heart. For further information please visit [email or web address].[email or web address]. Please follow instruction sheet as given.     Follow-Up: At [facility], you and your health needs are our priority.  As part of our continuing mission to provide you with exceptional heart care, we have created designated Provider Care Teams.  These Care Teams include your primary Cardiologist (physician) and Advanced Practice Providers (APPs -  Physician Assistants and Nurse Practitioners) who all work together to provide you with the care you need, when you need it.  We recommend signing up for the patient portal called "MyChart".  Sign up information is provided on this After Visit Summary.  MyChart is used to connect with patients for Virtual Visits (Telemedicine).  Patients are able to view lab/test results, encounter notes, upcoming appointments, etc.  Non-urgent messages can be sent to your provider as well.   To learn more about what you can do with MyChart, go to [email or web address].    Your next appointment:   2-3 month(s)  The format for your next appointment:   In Person  Provider:   Carlyle Dolly, MD or  Bernerd Pho, PA-C    Other Instructions Thank you for choosing [facility]!    Your cardiac CT will be scheduled at one of the below locations:   [facility] [address] [place], [place] [address] [phone]  [place] [place] [address] [place], [place] [address] [phone]  If scheduled at [facility], please arrive at the [facility] main entrance (entrance A) of [facility] 30 minutes prior to test start time. You can use the FREE valet parking offered at the main entrance (encouraged to control the heart rate for the test) Proceed to the [facility] Radiology Department (first floor) to check-in and test prep.  If scheduled at [facility], please arrive 15 mins early for check-in and test prep.  Please follow these instructions carefully (unless otherwise directed):  On the Night Before the Test: Be sure to Drink plenty of water. Do not consume any caffeinated/decaffeinated beverages or chocolate 12 hours prior to your test. Do not take any antihistamines 12 hours prior to your test.  On the Day of the Test: Drink plenty of water until 1 hour prior to the test. Do not eat any food 4 hours prior to the test. You may take your regular medications prior to the test.  Take metoprolol (Lopressor) two hours prior to test. HOLD Furosemide/Hydrochlorothiazide morning of the test. FEMALES- please wear underwire-free bra if available, avoid dresses & tight clothing   *For Clinical Staff only. Please instruct patient the following:* Heart Rate Medication Recommendations for Cardiac CT  Resting HR < 50 bpm  No medication  Resting HR 50-60 bpm and BP >110/50 mmHG   Consider Metoprolol tartrate 25 mg PO 90-120 min prior to scan  Resting HR 60-65 bpm and BP >110/50 mmHG  Metoprolol tartrate 50 mg PO 90-120 minutes prior to scan   Resting HR > 65 bpm and  BP >110/50 mmHG  Metoprolol tartrate 100 mg PO 90-120 minutes prior to scan  Consider Ivabradine 10-15 mg PO or a calcium channel blocker for resting HR >60 bpm and contraindication to metoprolol tartrate  Consider Ivabradine 10-15 mg PO in combination with metoprolol tartrate for HR >80 bpm         After the Test: Drink plenty of water. After receiving IV contrast, you may experience a mild flushed feeling. This is normal. On occasion, you may experience a mild rash up to 24 hours after the test. This is not dangerous. If this occurs, you can take Benadryl 25 mg and increase your fluid intake. If you experience trouble breathing, this can be serious. If it is severe call  911 IMMEDIATELY. If it is mild, please call our office. If you take any of these medications: Glipizide/Metformin, Avandament, Glucavance, please do not take 48 hours after completing test unless otherwise instructed.  We will call to schedule your test 2-4 weeks out understanding that some insurance companies will need an authorization prior to the service being performed.   For non-scheduling related questions, please contact the cardiac imaging nurse navigator should you have any questions/concerns: Marchia Bond, Cardiac Imaging Nurse Navigator Gordy Clement, Cardiac Imaging Nurse Navigator [facility] Direct Office Dial: [phone]   For scheduling needs, including cancellations and rescheduling, please call [place], [phone].     Signed, Erma Heritage, PA-C  12/28/2021 8:02 PM    [employer] [address] [place], [place] [address] Phone: [phone] Fax: [phone]

## 2021-12-28 NOTE — Patient Instructions (Addendum)
Medication Instructions:   Take Lopressor 100 mg 2 hours prior to CT Scan.  Nitro 0.4 mg for chest pain  Aspirin 81 mg Daily   *If you need a refill on your cardiac medications before your next appointment, please call your pharmacy*   Lab Work: Your physician recommends that you return for lab work in: 1 Week prior to CT scan.   If you have labs (blood work) drawn today and your tests are completely normal, you will receive your results only by: [place] (if you have MyChart) OR A paper copy in the mail If you have any lab test that is abnormal or we need to change your treatment, we will call you to review the results.   Testing/Procedures: Your physician has requested that you have cardiac CT. Cardiac computed tomography (CT) is a painless test that uses an x-ray machine to take clear, detailed pictures of your heart. For further information please visit [email or web address]. Please follow instruction sheet as given.     Follow-Up: At [facility], you and your health needs are our priority.  As part of our continuing mission to provide you with exceptional heart care, we have created designated Provider Care Teams.  These Care Teams include your primary Cardiologist (physician) and Advanced Practice Providers (APPs -  Physician Assistants and Nurse Practitioners) who all work together to provide you with the care you need, when you need it.  We recommend signing up for the patient portal called "MyChart".  Sign up information is provided on this After Visit Summary.  MyChart is used to connect with patients for Virtual Visits (Telemedicine).  Patients are able to view lab/test results, encounter notes, upcoming appointments, etc.  Non-urgent messages can be sent to your provider as well.   To learn more about what you can do with MyChart, go to [email or web address].    Your next appointment:   2-3 month(s)  The format for your next appointment:   In Person  Provider:    Carlyle Dolly, MD or Laura Pho, PA-C    Other Instructions Thank you for choosing [facility]!    Your cardiac CT will be scheduled at one of the below locations:   [facility] [address] [place], [place] [address] [phone]  [facility] [address] [place], [place] [address] [phone]  If scheduled at [facility], please arrive at the [facility] main entrance (entrance A) of [facility] 30 minutes prior to test start time. You can use the FREE valet parking offered at the main entrance (encouraged to control the heart rate for the test) Proceed to the [facility] Radiology Department (first floor) to check-in and test prep.  If scheduled at [facility], please arrive 15 mins early for check-in and test prep.  Please follow these instructions carefully (unless otherwise directed):  On the Night Before the Test: Be sure to Drink plenty of water. Do not consume any caffeinated/decaffeinated beverages or chocolate 12 hours prior to your test. Do not take any antihistamines 12 hours prior to your test.  On the Day of the Test: Drink plenty of water until 1 hour prior to the test. Do not eat any food 4 hours prior to the test. You may take your regular medications prior to the test.  Take metoprolol (Lopressor) two hours prior to test. HOLD Furosemide/Hydrochlorothiazide morning of the test. FEMALES- please wear underwire-free bra if available, avoid  dresses & tight clothing   *For Clinical Staff only. Please instruct patient the following:* Heart Rate Medication Recommendations for Cardiac CT  Resting HR < 50 bpm  No medication  Resting HR 50-60 bpm and BP >110/50 mmHG   Consider Metoprolol tartrate 25 mg PO 90-120 min prior to scan  Resting HR 60-65 bpm and BP >110/50 mmHG  Metoprolol tartrate 50 mg PO 90-120 minutes prior to scan    Resting HR > 65 bpm and BP >110/50 mmHG  Metoprolol tartrate 100 mg PO 90-120 minutes prior to scan  Consider Ivabradine 10-15 mg PO or a calcium channel blocker for resting HR >60 bpm and contraindication to metoprolol tartrate  Consider Ivabradine 10-15 mg PO in combination with metoprolol tartrate for HR >80 bpm         After the Test: Drink plenty of water. After receiving IV contrast, you may experience a mild flushed feeling. This is normal. On occasion, you may experience a mild rash up to 24 hours after the test. This is not dangerous. If this occurs, you can take Benadryl 25 mg and increase your fluid intake. If you experience trouble breathing, this can be serious. If it is severe call 911 IMMEDIATELY. If it is mild, please call our office. If you take any of these medications: Glipizide/Metformin, Avandament, Glucavance, please do not take 48 hours after completing test unless otherwise instructed.  We will call to schedule your test 2-4 weeks out understanding that some insurance companies will need an authorization prior to the service being performed.   For non-scheduling related questions, please contact the cardiac imaging nurse navigator should you have any questions/concerns: Marchia Bond, Cardiac Imaging Nurse Navigator Gordy Clement, Cardiac Imaging Nurse Navigator [facility] Direct Office Dial: [phone]   For scheduling needs, including cancellations and rescheduling, please call [place], [phone].

## 2022-01-04 ENCOUNTER — Telehealth (HOSPITAL_COMMUNITY): Payer: Self-pay | Admitting: *Deleted

## 2022-01-04 ENCOUNTER — Ambulatory Visit (HOSPITAL_COMMUNITY)
Admission: RE | Admit: 2022-01-04 | Discharge: 2022-01-04 | Disposition: A | Discharge status: Home / Self Care | Payer: Medicare HMO | Source: Ambulatory Visit | Attending: Internal Medicine | Admitting: Internal Medicine

## 2022-01-04 ENCOUNTER — Other Ambulatory Visit: Payer: Self-pay

## 2022-01-04 DIAGNOSIS — Z1231 Encounter for screening mammogram for malignant neoplasm of breast: Secondary | ICD-10-CM | POA: Diagnosis not present

## 2022-01-04 NOTE — Telephone Encounter (Signed)
Reaching out to patient to offer assistance regarding upcoming cardiac imaging study; pt verbalizes understanding of appt date/time, parking situation and where to check in, pre-test NPO status and medications ordered, and verified current allergies; name and call back number provided for further questions should they arise  Gordy Clement RN Navigator [facility] [phone] office [phone] cell  Patient to take her morning dose of metoprolol tartrate TWO hours prior to her cardiac CT. She is aware to obtain blood work prior to appointment and to arrive at 1:30pm for her 2pm scan.

## 2022-01-05 ENCOUNTER — Other Ambulatory Visit (HOSPITAL_COMMUNITY)
Admission: RE | Admit: 2022-01-05 | Discharge: 2022-01-05 | Disposition: A | Discharge status: Home / Self Care | Payer: Medicare HMO | Source: Ambulatory Visit | Attending: Student | Admitting: Student

## 2022-01-05 DIAGNOSIS — R0609 Other forms of dyspnea: Secondary | ICD-10-CM | POA: Diagnosis not present

## 2022-01-05 DIAGNOSIS — R079 Chest pain, unspecified: Secondary | ICD-10-CM | POA: Insufficient documentation

## 2022-01-05 DIAGNOSIS — I10 Essential (primary) hypertension: Secondary | ICD-10-CM | POA: Diagnosis not present

## 2022-01-05 LAB — BASIC METABOLIC PANEL
Anion gap: 5 (ref 5–15)
BUN: 13 mg/dL (ref 8–23)
CO2: 29 mmol/L (ref 22–32)
Calcium: 8.9 mg/dL (ref 8.9–10.3)
Chloride: 103 mmol/L (ref 98–111)
Creatinine, Ser: 0.6 mg/dL (ref 0.44–1.00)
GFR, Estimated: >60 mL/min (ref >60)
Glucose, Bld: 113 mg/dL — ABNORMAL HIGH (ref 70–99)
Potassium: 3.9 mmol/L (ref 3.5–5.1)
Sodium: 137 mmol/L (ref 135–145)

## 2022-01-09 ENCOUNTER — Ambulatory Visit (HOSPITAL_COMMUNITY)
Admission: RE | Admit: 2022-01-09 | Discharge: 2022-01-09 | Disposition: A | Discharge status: Home / Self Care | Payer: Medicare HMO | Source: Ambulatory Visit | Attending: Student | Admitting: Student

## 2022-01-09 ENCOUNTER — Other Ambulatory Visit: Payer: Self-pay

## 2022-01-09 DIAGNOSIS — I700 Atherosclerosis of aorta: Secondary | ICD-10-CM | POA: Diagnosis not present

## 2022-01-09 DIAGNOSIS — R079 Chest pain, unspecified: Secondary | ICD-10-CM | POA: Diagnosis not present

## 2022-01-09 DIAGNOSIS — I3481 Nonrheumatic mitral (valve) annulus calcification: Secondary | ICD-10-CM | POA: Diagnosis not present

## 2022-01-09 MED ORDER — NITROGLYCERIN 0.4 MG SL SUBL
0.8000 mg | SUBLINGUAL_TABLET | Freq: Once | SUBLINGUAL | Status: AC
  Administered 2022-01-09: 0.8 mg via SUBLINGUAL

## 2022-01-09 MED ORDER — IOHEXOL 350 MG/ML SOLN
95.0000 mL | Freq: Once | INTRAVENOUS | Status: AC | PRN
  Administered 2022-01-09: 95 mL via INTRAVENOUS

## 2022-01-09 MED ORDER — NITROGLYCERIN 0.4 MG SL SUBL
SUBLINGUAL_TABLET | SUBLINGUAL | Status: AC
  Filled 2022-01-09: qty 2

## 2022-01-18 DIAGNOSIS — F339 Major depressive disorder, recurrent, unspecified: Secondary | ICD-10-CM | POA: Diagnosis not present

## 2022-01-18 DIAGNOSIS — J302 Other seasonal allergic rhinitis: Secondary | ICD-10-CM | POA: Diagnosis not present

## 2022-01-18 DIAGNOSIS — J440 Chronic obstructive pulmonary disease with acute lower respiratory infection: Secondary | ICD-10-CM | POA: Diagnosis not present

## 2022-01-18 DIAGNOSIS — Z85118 Personal history of other malignant neoplasm of bronchus and lung: Secondary | ICD-10-CM | POA: Diagnosis not present

## 2022-01-18 DIAGNOSIS — G5620 Lesion of ulnar nerve, unspecified upper limb: Secondary | ICD-10-CM | POA: Diagnosis not present

## 2022-01-18 DIAGNOSIS — M549 Dorsalgia, unspecified: Secondary | ICD-10-CM | POA: Diagnosis not present

## 2022-01-18 DIAGNOSIS — Z6837 Body mass index (BMI) 37.0-37.9, adult: Secondary | ICD-10-CM | POA: Diagnosis not present

## 2022-01-18 DIAGNOSIS — E7801 Familial hypercholesterolemia: Secondary | ICD-10-CM | POA: Diagnosis not present

## 2022-01-26 ENCOUNTER — Other Ambulatory Visit: Payer: Self-pay

## 2022-01-26 ENCOUNTER — Inpatient Hospital Stay (HOSPITAL_COMMUNITY): Payer: Medicare HMO | Attending: Hematology

## 2022-01-26 VITALS — BP 129/85 | HR 74 | Temp 99.2°F | Resp 20

## 2022-01-26 DIAGNOSIS — Z452 Encounter for adjustment and management of vascular access device: Secondary | ICD-10-CM | POA: Insufficient documentation

## 2022-01-26 DIAGNOSIS — C3490 Malignant neoplasm of unspecified part of unspecified bronchus or lung: Secondary | ICD-10-CM | POA: Insufficient documentation

## 2022-01-26 DIAGNOSIS — Z95828 Presence of other vascular implants and grafts: Secondary | ICD-10-CM

## 2022-01-26 MED ORDER — SODIUM CHLORIDE 0.9% FLUSH
10.0000 mL | INTRAVENOUS | Status: DC | PRN
  Administered 2022-01-26: 10 mL via INTRAVENOUS

## 2022-01-26 MED ORDER — HEPARIN SOD (PORK) LOCK FLUSH 100 UNIT/ML IV SOLN
500.0000 [IU] | Freq: Once | INTRAVENOUS | Status: AC
  Administered 2022-01-26: 500 [IU] via INTRAVENOUS

## 2022-01-26 NOTE — Patient Instructions (Signed)
[facility]  Discharge Instructions: Thank you for choosing [facility] to provide your oncology and hematology care.  If you have a lab appointment with the Cancer Center, please come in thru the Main Entrance and check in at the main information desk.  Wear comfortable clothing and clothing appropriate for easy access to any Portacath or PICC line.   We strive to give you quality time with your provider. You may need to reschedule your appointment if you arrive late (15 or more minutes).  Arriving late affects you and other patients whose appointments are after yours.  Also, if you miss three or more appointments without notifying the office, you may be dismissed from the clinic at the provider's discretion.      For prescription refill requests, have your pharmacy contact our office and allow 72 hours for refills to be completed.        To help prevent nausea and vomiting after your treatment, we encourage you to take your nausea medication as directed.  BELOW ARE SYMPTOMS THAT SHOULD BE REPORTED IMMEDIATELY: *FEVER GREATER THAN 100.4 F (38 C) OR HIGHER *CHILLS OR SWEATING *NAUSEA AND VOMITING THAT IS NOT CONTROLLED WITH YOUR NAUSEA MEDICATION *UNUSUAL SHORTNESS OF BREATH *UNUSUAL BRUISING OR BLEEDING *URINARY PROBLEMS (pain or burning when urinating, or frequent urination) *BOWEL PROBLEMS (unusual diarrhea, constipation, pain near the anus) TENDERNESS IN MOUTH AND THROAT WITH OR WITHOUT PRESENCE OF ULCERS (sore throat, sores in mouth, or a toothache) UNUSUAL RASH, SWELLING OR PAIN  UNUSUAL VAGINAL DISCHARGE OR ITCHING   Items with * indicate a potential emergency and should be followed up as soon as possible or go to the Emergency Department if any problems should occur.  Please show the CHEMOTHERAPY ALERT CARD or IMMUNOTHERAPY ALERT CARD at check-in to the Emergency Department and triage nurse.  Should you have questions after your visit or need to cancel  or reschedule your appointment, please contact [facility] [phone]  and follow the prompts.  Office hours are 8:00 a.m. to 4:30 p.m. Monday - Friday. Please note that voicemails left after 4:00 p.m. may not be returned until the following business day.  We are closed weekends and major holidays. You have access to a nurse at all times for urgent questions. Please call the main number to the clinic [phone] and follow the prompts.  For any non-urgent questions, you may also contact your provider using MyChart. We now offer e-Visits for anyone 18 and older to request care online for non-urgent symptoms. For details visit [email or web address].   Also download the MyChart app! Go to the app store, search "MyChart", open the app, select , and log in with your MyChart username and password.  Due to Covid, a mask is required upon entering the hospital/clinic. If you do not have a mask, one will be given to you upon arrival. For doctor visits, patients may have 1 support person aged 18 or older with them. For treatment visits, patients cannot have anyone with them due to current Covid guidelines and our immunocompromised population.  

## 2022-01-26 NOTE — Progress Notes (Signed)
Patients port flushed without difficulty.  Good blood return noted with no bruising or swelling noted at site.  Band aid applied.  VSS with discharge and left in satisfactory condition with no s/s of distress noted.   

## 2022-02-07 DIAGNOSIS — Z1331 Encounter for screening for depression: Secondary | ICD-10-CM | POA: Diagnosis not present

## 2022-02-07 DIAGNOSIS — J329 Chronic sinusitis, unspecified: Secondary | ICD-10-CM | POA: Diagnosis not present

## 2022-02-07 DIAGNOSIS — G5620 Lesion of ulnar nerve, unspecified upper limb: Secondary | ICD-10-CM | POA: Diagnosis not present

## 2022-02-07 DIAGNOSIS — J40 Bronchitis, not specified as acute or chronic: Secondary | ICD-10-CM | POA: Diagnosis not present

## 2022-02-07 DIAGNOSIS — J440 Chronic obstructive pulmonary disease with acute lower respiratory infection: Secondary | ICD-10-CM | POA: Diagnosis not present

## 2022-02-07 DIAGNOSIS — E7801 Familial hypercholesterolemia: Secondary | ICD-10-CM | POA: Diagnosis not present

## 2022-02-07 DIAGNOSIS — Z85118 Personal history of other malignant neoplasm of bronchus and lung: Secondary | ICD-10-CM | POA: Diagnosis not present

## 2022-02-07 DIAGNOSIS — Z1389 Encounter for screening for other disorder: Secondary | ICD-10-CM | POA: Diagnosis not present

## 2022-02-22 DIAGNOSIS — H25813 Combined forms of age-related cataract, bilateral: Secondary | ICD-10-CM | POA: Diagnosis not present

## 2022-03-05 DIAGNOSIS — E7801 Familial hypercholesterolemia: Secondary | ICD-10-CM | POA: Diagnosis not present

## 2022-03-05 DIAGNOSIS — M549 Dorsalgia, unspecified: Secondary | ICD-10-CM | POA: Diagnosis not present

## 2022-03-05 DIAGNOSIS — Z85118 Personal history of other malignant neoplasm of bronchus and lung: Secondary | ICD-10-CM | POA: Diagnosis not present

## 2022-03-05 DIAGNOSIS — I10 Essential (primary) hypertension: Secondary | ICD-10-CM | POA: Diagnosis not present

## 2022-03-05 DIAGNOSIS — F339 Major depressive disorder, recurrent, unspecified: Secondary | ICD-10-CM | POA: Diagnosis not present

## 2022-03-05 DIAGNOSIS — M810 Age-related osteoporosis without current pathological fracture: Secondary | ICD-10-CM | POA: Diagnosis not present

## 2022-03-05 DIAGNOSIS — G5620 Lesion of ulnar nerve, unspecified upper limb: Secondary | ICD-10-CM | POA: Diagnosis not present

## 2022-03-05 DIAGNOSIS — J440 Chronic obstructive pulmonary disease with acute lower respiratory infection: Secondary | ICD-10-CM | POA: Diagnosis not present

## 2022-03-19 DIAGNOSIS — F339 Major depressive disorder, recurrent, unspecified: Secondary | ICD-10-CM | POA: Diagnosis not present

## 2022-03-19 DIAGNOSIS — E7801 Familial hypercholesterolemia: Secondary | ICD-10-CM | POA: Diagnosis not present

## 2022-03-19 DIAGNOSIS — I10 Essential (primary) hypertension: Secondary | ICD-10-CM | POA: Diagnosis not present

## 2022-03-19 DIAGNOSIS — J440 Chronic obstructive pulmonary disease with acute lower respiratory infection: Secondary | ICD-10-CM | POA: Diagnosis not present

## 2022-03-19 DIAGNOSIS — Z85118 Personal history of other malignant neoplasm of bronchus and lung: Secondary | ICD-10-CM | POA: Diagnosis not present

## 2022-03-19 DIAGNOSIS — G5620 Lesion of ulnar nerve, unspecified upper limb: Secondary | ICD-10-CM | POA: Diagnosis not present

## 2022-03-19 DIAGNOSIS — M810 Age-related osteoporosis without current pathological fracture: Secondary | ICD-10-CM | POA: Diagnosis not present

## 2022-03-19 DIAGNOSIS — M542 Cervicalgia: Secondary | ICD-10-CM | POA: Diagnosis not present

## 2022-03-20 DIAGNOSIS — M47812 Spondylosis without myelopathy or radiculopathy, cervical region: Secondary | ICD-10-CM | POA: Diagnosis not present

## 2022-03-20 DIAGNOSIS — M47892 Other spondylosis, cervical region: Secondary | ICD-10-CM | POA: Diagnosis not present

## 2022-03-20 DIAGNOSIS — M4802 Spinal stenosis, cervical region: Secondary | ICD-10-CM | POA: Diagnosis not present

## 2022-03-26 DIAGNOSIS — E7801 Familial hypercholesterolemia: Secondary | ICD-10-CM | POA: Diagnosis not present

## 2022-03-26 DIAGNOSIS — E7800 Pure hypercholesterolemia, unspecified: Secondary | ICD-10-CM | POA: Diagnosis not present

## 2022-03-26 DIAGNOSIS — G5620 Lesion of ulnar nerve, unspecified upper limb: Secondary | ICD-10-CM | POA: Diagnosis not present

## 2022-03-26 DIAGNOSIS — G8929 Other chronic pain: Secondary | ICD-10-CM | POA: Diagnosis not present

## 2022-03-26 DIAGNOSIS — Z85118 Personal history of other malignant neoplasm of bronchus and lung: Secondary | ICD-10-CM | POA: Diagnosis not present

## 2022-03-26 DIAGNOSIS — M542 Cervicalgia: Secondary | ICD-10-CM | POA: Diagnosis not present

## 2022-03-26 DIAGNOSIS — J440 Chronic obstructive pulmonary disease with acute lower respiratory infection: Secondary | ICD-10-CM | POA: Diagnosis not present

## 2022-03-26 DIAGNOSIS — I10 Essential (primary) hypertension: Secondary | ICD-10-CM | POA: Diagnosis not present

## 2022-04-24 DIAGNOSIS — H25813 Combined forms of age-related cataract, bilateral: Secondary | ICD-10-CM | POA: Diagnosis not present

## 2022-04-24 DIAGNOSIS — H353112 Nonexudative age-related macular degeneration, right eye, intermediate dry stage: Secondary | ICD-10-CM | POA: Diagnosis not present

## 2022-04-24 DIAGNOSIS — H01001 Unspecified blepharitis right upper eyelid: Secondary | ICD-10-CM | POA: Diagnosis not present

## 2022-04-24 DIAGNOSIS — H01002 Unspecified blepharitis right lower eyelid: Secondary | ICD-10-CM | POA: Diagnosis not present

## 2022-04-25 ENCOUNTER — Ambulatory Visit (HOSPITAL_COMMUNITY)
Admission: RE | Admit: 2022-04-25 | Discharge: 2022-04-25 | Disposition: A | Discharge status: Home / Self Care | Payer: Medicare HMO | Source: Ambulatory Visit | Attending: Hematology | Admitting: Hematology

## 2022-04-25 ENCOUNTER — Inpatient Hospital Stay (HOSPITAL_COMMUNITY): Payer: Medicare HMO | Attending: Hematology

## 2022-04-25 DIAGNOSIS — Z853 Personal history of malignant neoplasm of breast: Secondary | ICD-10-CM | POA: Insufficient documentation

## 2022-04-25 DIAGNOSIS — I700 Atherosclerosis of aorta: Secondary | ICD-10-CM | POA: Insufficient documentation

## 2022-04-25 DIAGNOSIS — J439 Emphysema, unspecified: Secondary | ICD-10-CM | POA: Insufficient documentation

## 2022-04-25 DIAGNOSIS — R911 Solitary pulmonary nodule: Secondary | ICD-10-CM | POA: Diagnosis not present

## 2022-04-25 DIAGNOSIS — C3490 Malignant neoplasm of unspecified part of unspecified bronchus or lung: Secondary | ICD-10-CM

## 2022-04-25 DIAGNOSIS — Z79899 Other long term (current) drug therapy: Secondary | ICD-10-CM | POA: Diagnosis not present

## 2022-04-25 DIAGNOSIS — Z902 Acquired absence of lung [part of]: Secondary | ICD-10-CM | POA: Insufficient documentation

## 2022-04-25 LAB — COMPREHENSIVE METABOLIC PANEL
ALT: 24 U/L (ref 0–44)
AST: 33 U/L (ref 15–41)
Albumin: 3.9 g/dL (ref 3.5–5.0)
Alkaline Phosphatase: 83 U/L (ref 38–126)
Anion gap: 6 (ref 5–15)
BUN: 14 mg/dL (ref 8–23)
CO2: 29 mmol/L (ref 22–32)
Calcium: 9.3 mg/dL (ref 8.9–10.3)
Chloride: 101 mmol/L (ref 98–111)
Creatinine, Ser: 0.67 mg/dL (ref 0.44–1.00)
GFR, Estimated: >60 mL/min (ref >60)
Glucose, Bld: 89 mg/dL (ref 70–99)
Potassium: 4 mmol/L (ref 3.5–5.1)
Sodium: 136 mmol/L (ref 135–145)
Total Bilirubin: 0.8 mg/dL (ref 0.3–1.2)
Total Protein: 8 g/dL (ref 6.5–8.1)

## 2022-04-25 LAB — CBC WITH DIFFERENTIAL/PLATELET
Abs Immature Granulocytes: 0.03 10*3/uL (ref 0.00–0.07)
Basophils Absolute: 0.1 10*3/uL (ref 0.0–0.1)
Basophils Relative: 1 %
Eosinophils Absolute: 0.2 10*3/uL (ref 0.0–0.5)
Eosinophils Relative: 2 %
HCT: 40.3 % (ref 36.0–46.0)
Hemoglobin: 12.8 g/dL (ref 12.0–15.0)
Immature Granulocytes: 0 %
Lymphocytes Relative: 29 %
Lymphs Abs: 2.9 10*3/uL (ref 0.7–4.0)
MCH: 29.7 pg (ref 26.0–34.0)
MCHC: 31.8 g/dL (ref 30.0–36.0)
MCV: 93.5 fL (ref 80.0–100.0)
Monocytes Absolute: 1 10*3/uL (ref 0.1–1.0)
Monocytes Relative: 10 %
Neutro Abs: 5.8 10*3/uL (ref 1.7–7.7)
Neutrophils Relative %: 58 %
Platelets: 339 10*3/uL (ref 150–400)
RBC: 4.31 MIL/uL (ref 3.87–5.11)
RDW: 14.3 % (ref 11.5–15.5)
WBC: 9.9 10*3/uL (ref 4.0–10.5)
nRBC: 0 % (ref 0.0–0.2)

## 2022-05-02 ENCOUNTER — Other Ambulatory Visit: Payer: Self-pay | Admitting: Nurse Practitioner

## 2022-05-02 ENCOUNTER — Encounter (HOSPITAL_COMMUNITY): Payer: Self-pay | Admitting: Hematology

## 2022-05-02 ENCOUNTER — Inpatient Hospital Stay (HOSPITAL_COMMUNITY): Payer: Medicare HMO

## 2022-05-02 ENCOUNTER — Inpatient Hospital Stay (HOSPITAL_COMMUNITY): Payer: Medicare HMO | Attending: Hematology | Admitting: Hematology

## 2022-05-02 VITALS — BP 130/54 | HR 85 | Temp 97.4°F | Resp 18 | Ht 63.5 in | Wt 209.1 lb

## 2022-05-02 DIAGNOSIS — C3490 Malignant neoplasm of unspecified part of unspecified bronchus or lung: Secondary | ICD-10-CM | POA: Insufficient documentation

## 2022-05-02 DIAGNOSIS — Z8379 Family history of other diseases of the digestive system: Secondary | ICD-10-CM | POA: Insufficient documentation

## 2022-05-02 DIAGNOSIS — M542 Cervicalgia: Secondary | ICD-10-CM | POA: Insufficient documentation

## 2022-05-02 DIAGNOSIS — I700 Atherosclerosis of aorta: Secondary | ICD-10-CM | POA: Insufficient documentation

## 2022-05-02 DIAGNOSIS — Z923 Personal history of irradiation: Secondary | ICD-10-CM | POA: Insufficient documentation

## 2022-05-02 DIAGNOSIS — Z801 Family history of malignant neoplasm of trachea, bronchus and lung: Secondary | ICD-10-CM | POA: Insufficient documentation

## 2022-05-02 DIAGNOSIS — Z87891 Personal history of nicotine dependence: Secondary | ICD-10-CM | POA: Diagnosis not present

## 2022-05-02 DIAGNOSIS — Z79899 Other long term (current) drug therapy: Secondary | ICD-10-CM | POA: Diagnosis not present

## 2022-05-02 DIAGNOSIS — E669 Obesity, unspecified: Secondary | ICD-10-CM | POA: Diagnosis not present

## 2022-05-02 DIAGNOSIS — Z803 Family history of malignant neoplasm of breast: Secondary | ICD-10-CM | POA: Diagnosis not present

## 2022-05-02 DIAGNOSIS — R059 Cough, unspecified: Secondary | ICD-10-CM | POA: Insufficient documentation

## 2022-05-02 DIAGNOSIS — Z8051 Family history of malignant neoplasm of kidney: Secondary | ICD-10-CM | POA: Insufficient documentation

## 2022-05-02 DIAGNOSIS — Z8269 Family history of other diseases of the musculoskeletal system and connective tissue: Secondary | ICD-10-CM | POA: Diagnosis not present

## 2022-05-02 DIAGNOSIS — Z95828 Presence of other vascular implants and grafts: Secondary | ICD-10-CM

## 2022-05-02 DIAGNOSIS — Z9049 Acquired absence of other specified parts of digestive tract: Secondary | ICD-10-CM | POA: Diagnosis not present

## 2022-05-02 DIAGNOSIS — I10 Essential (primary) hypertension: Secondary | ICD-10-CM | POA: Diagnosis not present

## 2022-05-02 DIAGNOSIS — Z853 Personal history of malignant neoplasm of breast: Secondary | ICD-10-CM | POA: Diagnosis not present

## 2022-05-02 DIAGNOSIS — Z806 Family history of leukemia: Secondary | ICD-10-CM | POA: Insufficient documentation

## 2022-05-02 DIAGNOSIS — M549 Dorsalgia, unspecified: Secondary | ICD-10-CM | POA: Insufficient documentation

## 2022-05-02 DIAGNOSIS — Z8261 Family history of arthritis: Secondary | ICD-10-CM | POA: Diagnosis not present

## 2022-05-02 DIAGNOSIS — Z8249 Family history of ischemic heart disease and other diseases of the circulatory system: Secondary | ICD-10-CM | POA: Insufficient documentation

## 2022-05-02 DIAGNOSIS — Z800 Family history of malignant neoplasm of digestive organs: Secondary | ICD-10-CM | POA: Insufficient documentation

## 2022-05-02 DIAGNOSIS — Z87442 Personal history of urinary calculi: Secondary | ICD-10-CM | POA: Insufficient documentation

## 2022-05-02 DIAGNOSIS — R0602 Shortness of breath: Secondary | ICD-10-CM | POA: Diagnosis not present

## 2022-05-02 MED ORDER — SODIUM CHLORIDE 0.9% FLUSH
10.0000 mL | INTRAVENOUS | Status: DC | PRN
  Administered 2022-05-02: 10 mL via INTRAVENOUS

## 2022-05-02 MED ORDER — HEPARIN SOD (PORK) LOCK FLUSH 100 UNIT/ML IV SOLN
500.0000 [IU] | Freq: Once | INTRAVENOUS | Status: AC
  Administered 2022-05-02: 500 [IU] via INTRAVENOUS

## 2022-05-02 NOTE — Patient Instructions (Signed)
[place] at [facility] Discharge Instructions  You were seen today by Dr. Delton Coombes. He went over your recent results. You will be scheduled for a CT scan prior to your next visit. Dr. Delton Coombes will see you back in 6 months for labs and follow up.   Thank you for choosing [place] at [facility] to provide your oncology and hematology care.  To afford each patient quality time with our provider, please arrive at least 15 minutes before your scheduled appointment time.   If you have a lab appointment with the [facility] please come in thru the Main Entrance and check in at the main information desk  You need to re-schedule your appointment should you arrive 10 or more minutes late.  We strive to give you quality time with our providers, and arriving late affects you and other patients whose appointments are after yours.  Also, if you no show three or more times for appointments you may be dismissed from the clinic at the providers discretion.     Again, thank you for choosing [facility].  Our hope is that these requests will decrease the amount of time that you wait before being seen by our physicians.       _____________________________________________________________  Should you have questions after your visit to [facility], please contact our office at [phone] between the hours of 8:00 a.m. and 4:30 p.m.  Voicemails left after 4:00 p.m. will not be returned until the following business day.  For prescription refill requests, have your pharmacy contact our office and allow 72 hours.    Cancer Center Support Programs:   > Cancer Support Group  2nd Tuesday of the month 1pm-2pm, Journey Room

## 2022-05-02 NOTE — Progress Notes (Signed)
[place], [place] [address]   CLINIC:  Medical Oncology/Hematology  PCP:  Waterville Nation, MD [address] / [place] [address] [phone]   REASON FOR VISIT:  Follow-up for left neuroendocrine lung cancer  PRIOR THERAPY:  1. Left upper lobectomy on 12/15/2018. 2. Carboplatin and etoposide x 4 cycles from 01/20/2019 to 03/23/2019.  NGS Results: not done  CURRENT THERAPY: surveillance  BRIEF ONCOLOGIC HISTORY:  Oncology History  DCIS (ductal carcinoma in situ) of breast  10/28/2013 Initial Diagnosis   DCIS (ductal carcinoma in situ) of breast    12/21/2018 Genetic Testing   Negative genetic testing on the multicancer panel.  The Multi-Gene Panel offered by [facility] includes sequencing and/or deletion duplication testing of the following 84 genes: AIP, ALK, APC, ATM, AXIN2,BAP1,  BARD1, BLM, BMPR1A, BRCA1, BRCA2, BRIP1, CASR, CDC73, CDH1, CDK4, CDKN1B, CDKN1C, CDKN2A (p14ARF), CDKN2A (p16INK4a), CEBPA, CHEK2, CTNNA1, DICER1, DIS3L2, EGFR (c.2369C>T, p.Thr790Met variant only), EPCAM (Deletion/duplication testing only), FH, FLCN, GATA2, GPC3, GREM1 (Promoter region deletion/duplication testing only), HOXB13 (c.251G>A, p.Gly84Glu), HRAS, KIT, MAX, MEN1, MET, MITF (c.952G>A, p.Glu318Lys variant only), MLH1, MSH2, MSH3, MSH6, MUTYH, NBN, NF1, NF2, NTHL1, PALB2, PDGFRA, PHOX2B, PMS2, POLD1, POLE, POT1, PRKAR1A, PTCH1, PTEN, RAD50, RAD51C, RAD51D, RB1, RECQL4, RET, RUNX1, SDHAF2, SDHA (sequence changes only), SDHB, SDHC, SDHD, SMAD4, SMARCA4, SMARCB1, SMARCE1, STK11, SUFU, TERC, TERT, TMEM127, TP53, TSC1, TSC2, VHL, WRN and WT1.  The report date is 12/21/2018.    Lung cancer ([facility])  12/15/2018 Initial Diagnosis   Lung cancer ([place] [facility])    12/21/2018 Genetic Testing   Negative genetic testing on the multicancer panel.  The Multi-Gene Panel offered by [facility] includes sequencing and/or deletion duplication testing of the following 84 genes: AIP, ALK,  APC, ATM, AXIN2,BAP1,  BARD1, BLM, BMPR1A, BRCA1, BRCA2, BRIP1, CASR, CDC73, CDH1, CDK4, CDKN1B, CDKN1C, CDKN2A (p14ARF), CDKN2A (p16INK4a), CEBPA, CHEK2, CTNNA1, DICER1, DIS3L2, EGFR (c.2369C>T, p.Thr790Met variant only), EPCAM (Deletion/duplication testing only), FH, FLCN, GATA2, GPC3, GREM1 (Promoter region deletion/duplication testing only), HOXB13 (c.251G>A, p.Gly84Glu), HRAS, KIT, MAX, MEN1, MET, MITF (c.952G>A, p.Glu318Lys variant only), MLH1, MSH2, MSH3, MSH6, MUTYH, NBN, NF1, NF2, NTHL1, PALB2, PDGFRA, PHOX2B, PMS2, POLD1, POLE, POT1, PRKAR1A, PTCH1, PTEN, RAD50, RAD51C, RAD51D, RB1, RECQL4, RET, RUNX1, SDHAF2, SDHA (sequence changes only), SDHB, SDHC, SDHD, SMAD4, SMARCA4, SMARCB1, SMARCE1, STK11, SUFU, TERC, TERT, TMEM127, TP53, TSC1, TSC2, VHL, WRN and WT1.  The report date is 12/21/2018.    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  The Multi-Gene Panel offered by [facility] includes sequencing and/or deletion duplication testing of the following 84 genes: AIP, ALK, APC, ATM, AXIN2,BAP1,  BARD1, BLM, BMPR1A, BRCA1, BRCA2, BRIP1, CASR, CDC73, CDH1, CDK4, CDKN1B, CDKN1C, CDKN2A (p14ARF), CDKN2A (p16INK4a), CEBPA, CHEK2, CTNNA1, DICER1, DIS3L2, EGFR (c.2369C>T, p.Thr790Met variant only), EPCAM (Deletion/duplication testing only), FH, FLCN, GATA2, GPC3, GREM1 (Promoter region deletion/duplication testing only), HOXB13 (c.251G>A, p.Gly84Glu), HRAS, KIT, MAX, MEN1, MET, MITF (c.952G>A, p.Glu318Lys variant only), MLH1, MSH2, MSH3, MSH6, MUTYH, NBN, NF1, NF2, NTHL1, PALB2, PDGFRA, PHOX2B, PMS2, POLD1, POLE, POT1, PRKAR1A, PTCH1, PTEN, RAD50, RAD51C, RAD51D, RB1, RECQL4, RET, RUNX1, SDHAF2, SDHA (sequence changes only), SDHB, SDHC, SDHD, SMAD4, SMARCA4, SMARCB1, SMARCE1, STK11, SUFU, TERC, TERT, TMEM127, TP53, TSC1, TSC2, VHL, WRN and WT1.  The  report date is 12/21/2018.    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[facility])    01/20/2019 -  Chemotherapy   The patient had palonosetron (ALOXI) injection 0.25 mg, 0.25 mg, Intravenous,  Once, 4 of 4 cycles Administration: 0.25 mg (01/20/2019), 0.25 mg (02/10/2019), 0.25 mg (03/02/2019), 0.25 mg (03/23/2019) pegfilgrastim-cbqv (UDENYCA) injection 6 mg, 6 mg, Subcutaneous, Once, 4 of 4 cycles Administration: 6 mg (01/23/2019), 6 mg (02/13/2019), 6 mg (03/05/2019), 6 mg (03/26/2019) CARBOplatin (PARAPLATIN) 500 mg in sodium chloride 0.9 % 250 mL chemo infusion, 500 mg (100 % of original dose 504.5 mg), Intravenous,  Once, 4 of 4 cycles Dose modification:   (original dose 504.5 mg, Cycle 1),   (original dose 504.5 mg, Cycle 2),   (original dose 504.5 mg, Cycle 3),   (original dose 504.5 mg, Cycle 4) Administration: 500 mg (01/20/2019), 500 mg (02/10/2019), 500 mg (03/02/2019), 500 mg (03/23/2019) etoposide (VEPESID) 200 mg in sodium chloride 0.9 % 500 mL chemo infusion, 100 mg/m2 = 200 mg, Intravenous,  Once, 4 of 4 cycles Administration: 200 mg (01/20/2019), 200 mg (01/21/2019), 200 mg (01/22/2019), 200 mg (02/10/2019), 200 mg (02/11/2019), 200 mg (02/12/2019), 200 mg (03/02/2019), 200 mg (03/03/2019), 200 mg (03/04/2019), 200 mg (03/23/2019), 200 mg (03/24/2019), 200 mg (03/25/2019)   for chemotherapy treatment.       CANCER STAGING: Cancer Staging  DCIS (ductal carcinoma in situ) of breast Staging form: Breast, AJCC 7th Edition - Clinical stage from 12/01/2013: Stage 0 (Tis (DCIS), N0, cM0) - Signed by Heath Lark, MD on 12/01/2013 - Pathologic: Stage 0 (Tis (DCIS), N0, cM0) - Signed by Heath Lark, MD on 12/01/2013   INTERVAL HISTORY:  [name], a 76 y.o. female, returns for routine follow-up of her left neuroendocrine lung cancer. Alaina was last seen on 10/24/2021.   Today she reports feeling good. She reports chronic back and neck pain.    REVIEW OF SYSTEMS:   Review of Systems  Constitutional:  Negative for appetite change and fatigue.  Respiratory:  Positive for cough and shortness of breath.   Musculoskeletal:  Positive for back pain (7/10), gait problem (balance issues) and neck pain (7/10).  Neurological:  Positive for gait problem (balance issues), headaches and numbness.  Hematological:  Bruises/bleeds easily.  All other systems reviewed and are negative.  PAST MEDICAL/SURGICAL HISTORY:  Past Medical History:  Diagnosis Date   Anxiety    Arthritis    Asthma    Breast cancer ([facility])    breast, right DCIS   COPD (chronic obstructive pulmonary disease) ([facility])    COPD exacerbation ([facility]) 01/03/2014   With hypoxia   Depression    Emphysema of lung ([facility])    Family history of breast cancer    Family history of colon cancer    Family history of kidney cancer    Family history of lung cancer    Family history of pancreatic cancer    GERD (gastroesophageal reflux disease)    History of kidney stones    Hyperlipidemia    Hypertension    Insomnia    Lung cancer ([facility])    Obesity 01/05/2014   Osteoporosis    Osteoporosis, unspecified 12/01/2013   Personal history of radiation therapy    rt breast   Pneumonia    x2   Tobacco abuse 12/01/2013   Ulnar nerve impingement, left    Past Surgical History:  Procedure Laterality Date   ABDOMINAL HYSTERECTOMY     BLADDER SURGERY     had tacked   BREAST LUMPECTOMY Right  BREAST LUMPECTOMY WITH NEEDLE LOCALIZATION AND AXILLARY SENTINEL LYMPH NODE BX Right 11/09/2013   Procedure: BREAST LUMPECTOMY WITH NEEDLE LOCALIZATION AND AXILLARY SENTINEL LYMPH NODE BIOPSY;  Surgeon: Harl Bowie, MD;  Location: [facility];  Service: General;  Laterality: Right;   BREAST SURGERY Right    lumpectomy   CHOLECYSTECTOMY     NODE DISSECTION Left 12/15/2018   Procedure: NODE DISSECTION LEFT LUNG;  Surgeon: Melrose Nakayama, MD;  Location: [facility];  Service: Thoracic;  Laterality: Left;   PORTACATH PLACEMENT  Right 01/19/2019   Procedure: INSERTION PORT-A-CATH;  Surgeon: Aviva Signs, MD;  Location: [facility];  Service: General;  Laterality: Right;   VIDEO ASSISTED THORACOSCOPY (VATS)/ LOBECTOMY Left 12/15/2018   Procedure: VIDEO ASSISTED THORACOSCOPY (VATS)/LEFT UPPER LOBECTOMY;  Surgeon: Melrose Nakayama, MD;  Location[facility];  Service: Thoracic;  Laterality: Left;    SOCIAL HISTORY:  Social History   Socioeconomic History   Marital status: Married    Spouse name: Not on file   Number of children: 3   Years of education: Not on file   Highest education level: Not on file  Occupational History   Occupation: Database administrator    Comment: Engineering geologist, retired    Comment: [place]: [employer]  Tobacco Use   Smoking status: Former    Packs/day: 0.50    Years: 43.00    Pack years: 21.50    Types: Cigarettes    Quit date: 10/24/2018    Years since quitting: 3.5   Smokeless tobacco: Never  Vaping Use   Vaping Use: Never used  Substance and Sexual Activity   Alcohol use: Never   Drug use: No   Sexual activity: Not Currently    Birth control/protection: Surgical    Comment: hyst  Other Topics Concern   Not on file  Social History Narrative   Lives with husband   Caffeine-- coffee 2 c daily   Social Determinants of Health   Financial Resource Strain: Not on file  Food Insecurity: Not on file  Transportation Needs: Not on file  Physical Activity: Not on file  Stress: Not on file  Social Connections: Not on file  Intimate Partner Violence: Not on file    FAMILY HISTORY:  Family History  Problem Relation Age of Onset   Lung cancer Mother 67       non smoker   Rheum arthritis Mother    Pancreatic cancer Father 109       d. 64   Leukemia Sister        dx in her 52s   Heart disease Sister    Colon cancer Sister        dx and died in her 47s   Heart disease Sister    Lung cancer Maternal Aunt        non-smoker   Heart disease Sister    Breast cancer  Sister        dx early 73s   Heart disease Brother    Cirrhosis Brother        d. 69   Aneurysm Brother        d. 72s   Fibromyalgia Daughter    Kidney cancer Nephew 71   Kidney cancer Nephew        dx in 65s; great nephew   Leukemia Nephew    Liver disease Nephew    Other Niece        white matter on brain; dx late  40s   Leukemia Nephew     CURRENT MEDICATIONS:  Current Outpatient Medications  Medication Sig Dispense Refill   albuterol (PROVENTIL HFA;VENTOLIN HFA) 108 (90 BASE) MCG/ACT inhaler Inhale 2 puffs into the lungs 3 (three) times daily. Also use the inhaler every 2 hours if needed for worsening shortness of breath and wheezing. 1 Inhaler 3   aspirin EC 81 MG tablet Take 1 tablet (81 mg total) by mouth daily. Swallow whole. 90 tablet 3   Calcium 600-200 MG-UNIT per tablet Take 1 tablet by mouth daily.     clonazePAM (KLONOPIN) 0.5 MG tablet Take 0.5 mg by mouth at bedtime.     FLUoxetine (PROZAC) 40 MG capsule Take 40 mg by mouth daily.     fluticasone (FLONASE) 50 MCG/ACT nasal spray Place 2 sprays into both nostrils daily.     furosemide (LASIX) 20 MG tablet Take 20 mg by mouth as needed.     gabapentin (NEURONTIN) 300 MG capsule Take 300 mg by mouth 2 (two) times daily.     lisinopril (ZESTRIL) 20 MG tablet Take 1 tablet (20 mg total) by mouth daily. 90 tablet 1   metoprolol (LOPRESSOR) 50 MG tablet Take 50-75 mg by mouth 2 (two) times daily. Takes 75 mg in the morning and 50 mg in the evening     nitroGLYCERIN (NITROSTAT) 0.4 MG SL tablet Place 1 tablet (0.4 mg total) under the tongue every 5 (five) minutes as needed for chest pain. 25 tablet 3   omeprazole (PRILOSEC) 40 MG capsule Take 40 mg by mouth daily.      rosuvastatin (CRESTOR) 10 MG tablet Take 10 mg by mouth daily.     SPIRIVA HANDIHALER 18 MCG inhalation capsule Place 1 capsule into inhaler and inhale daily at 6 (six) AM.     tiZANidine (ZANAFLEX) 4 MG tablet Take 2 mg by mouth 3 (three) times daily as  needed.     No current facility-administered medications for this visit.   Facility-Administered Medications Ordered in Other Visits  Medication Dose Route Frequency Provider Last Rate Last Admin   sodium chloride flush (NS) 0.9 % injection 10 mL  10 mL Intracatheter PRN Derek Jack, MD   10 mL at 02/10/19 1513    ALLERGIES:  No Known Allergies  PHYSICAL EXAM:  Performance status (ECOG): 1 - Symptomatic but completely ambulatory  Vitals:   05/02/22 1456  BP: (!) 130/54  Pulse: 85  Resp: 18  Temp: (!) 97.4 F (36.3 C)  SpO2: 95%   Wt Readings from Last 3 Encounters:  05/02/22 209 lb 1.6 oz (94.8 kg)  12/28/21 205 lb (93 kg)  10/24/21 206 lb 9.6 oz (93.7 kg)   Physical Exam Vitals reviewed.  Constitutional:      Appearance: Normal appearance. She is obese.  Cardiovascular:     Rate and Rhythm: Normal rate and regular rhythm.     Pulses: Normal pulses.     Heart sounds: Normal heart sounds.  Pulmonary:     Effort: Pulmonary effort is normal.     Breath sounds: Normal breath sounds.  Neurological:     General: No focal deficit present.     Mental Status: She is alert and oriented to person, place, and time.  Psychiatric:        Mood and Affect: Mood normal.        Behavior: Behavior normal.     LABORATORY DATA:  I have reviewed the labs as listed.     Latest Ref  Rng & Units 04/25/2022    2:03 PM 10/17/2021    1:03 PM 04/11/2021    1:20 PM  CBC  WBC 4.0 - 10.5 K/uL 9.9   8.9   10.4    Hemoglobin 12.0 - 15.0 g/dL 12.8   13.6   12.7    Hematocrit 36.0 - 46.0 % 40.3   42.3   42.3    Platelets 150 - 400 K/uL 339   284   290        Latest Ref Rng & Units 04/25/2022    2:03 PM 01/05/2022   10:48 AM 10/17/2021    1:03 PM  CMP  Glucose 70 - 99 mg/dL 89   113   133    BUN 8 - 23 mg/dL _0 Creatinine 0.44 - 1.00 mg/dL 0.67   0.60   0.64    Sodium 135 - 145 mmol/L 136   137   138    Potassium 3.5 - 5.1 mmol/L 4.0   3.9   4.0    Chloride 98 -  111 mmol/L 101   103   99    CO2 22 - 32 mmol/L 29   29   33    Calcium 8.9 - 10.3 mg/dL 9.3   8.9   10.0    Total Protein 6.5 - 8.1 g/dL 8.0    7.5    Total Bilirubin 0.3 - 1.2 mg/dL 0.8    1.0    Alkaline Phos 38 - 126 U/L 83    65    AST 15 - 41 U/L 33    24    ALT 0 - 44 U/L 24    17      DIAGNOSTIC IMAGING:  I have independently reviewed the scans and discussed with the patient. CT Chest Wo Contrast  Result Date: 04/26/2022 CLINICAL DATA:  Small-cell lung cancer, assess treatment response. Prior left upper lobectomy and chemotherapy. Remote history of right breast cancer. * Tracking Code: BO * EXAM: CT CHEST WITHOUT CONTRAST TECHNIQUE: Multidetector CT imaging of the chest was performed following the standard protocol without IV contrast. RADIATION DOSE REDUCTION: This exam was performed according to the departmental dose-optimization program which includes automated exposure control, adjustment of the mA and/or kV according to patient size and/or use of iterative reconstruction technique. COMPARISON:  Multiple priors including most recent chest CT October 17, 2021 FINDINGS: Cardiovascular: Right chest Port-A-Cath with tip in the SVC. Aortic atherosclerosis. Coronary artery calcifications. Calcifications of the mitral annulus. Normal size heart. No significant pericardial effusion/thickening. Mediastinum/Nodes: No suspicious thyroid nodule. No pathologically enlarged mediastinal, hilar or axillary lymph nodes, noting limited sensitivity for the detection of hilar adenopathy on this noncontrast study. The esophagus is grossly unremarkable. Lungs/Pleura: Similar changes of left upper lobectomy. Stable 3 mm solid right upper lobe pulmonary nodule on image 33/4 unchanged dating back to Apr 21, 2019 consistent with a benign finding. No new suspicious pulmonary nodules or masses. Stable mild emphysematous change and scattered subpleural reticulations. Bibasilar atelectasis/scarring unchanged from prior.  No pleural effusion. No pneumothorax. Upper Abdomen: No acute abnormality.  No adrenal nodule or mass. Musculoskeletal: No aggressive lytic or blastic lesion of bone. Thoracic spondylosis. IMPRESSION: 1. Stable postoperative changes of left upper lobe resection without evidence of recurrent or metastatic disease. 2. Aortic Atherosclerosis (ICD10-I70.0) and Emphysema (ICD10-J43.9). Electronically Signed   By: Dahlia Bailiff M.D.   On: 04/26/2022 12:54     ASSESSMENT:  1.  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  She was found to have an abnormal chest x-ray followed by CT scan which showed left upper lobe lung mass. - She smoked 1 to 2 packs of cigarettes per day for the last 50 years.  She quit smoking on 10/23/2018. - Denies any significant weight loss.  But has night sweats occasionally.  Denies any hemoptysis. - PET CT scan on 11/17/2018 showed left upper lobe lung mass, 4.9 cm, no evidence of adenopathy or metastasis.  MRI of the brain was negative for intracranial mets. - Left upper lobectomy and lymph node biopsy on 12/15/2018.  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   -Cancer type ID testing shows 96% probability of neuroendocrine carcinoma (small cell/large cell carcinoma). -4 cycles of adjuvant chemotherapy with carboplatin and etoposide from 01/20/2019 through 03/23/2019. -CT of the chest on 08/25/2019 showed left upper lobectomy changes with tiny pulmonary nodules unchanged. -CT chest with contrast on 01/06/2021 with stable surgical changes in the left upper lobectomy.  New 22 mm right middle lobe density, likely an area of infection/inflammation.  No adenopathy.   2.  Family history: -Mother had lung cancer and was never smoker.  Father had pancreatic cancer. -1 sister has colon cancer in 1 sister with  leukemia.  Another sister had breast cancer. -[facility] testing on 12/09/2018 was negative for hereditary malignancies.   PLAN:  1.  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  No chest transported. - Reviewed CT chest without contrast from 04/25/2022 showing stable postoperative changes with no recurrent disease. - Labs including CBC and CMP are within normal limits. - RTC 6 months with repeat labs and scan.   Orders placed this encounter:  No orders of the defined types were placed in this encounter.    Derek Jack, MD [place] [phone]   I, Thana Ates, am acting as a scribe for Dr. Derek Jack.  I, Derek Jack MD, have reviewed the above documentation for accuracy and completeness, and I agree with the above.

## 2022-05-02 NOTE — Progress Notes (Signed)
Patients port flushed without difficulty.  Good blood return noted with no bruising or swelling noted at site.  Band aid applied.  VSS with discharge and left in satisfactory condition with no s/s of distress noted.   

## 2022-05-05 ENCOUNTER — Other Ambulatory Visit: Payer: Self-pay | Admitting: Nurse Practitioner

## 2022-05-21 DIAGNOSIS — G5620 Lesion of ulnar nerve, unspecified upper limb: Secondary | ICD-10-CM | POA: Diagnosis not present

## 2022-05-21 DIAGNOSIS — Z85118 Personal history of other malignant neoplasm of bronchus and lung: Secondary | ICD-10-CM | POA: Diagnosis not present

## 2022-05-21 DIAGNOSIS — M542 Cervicalgia: Secondary | ICD-10-CM | POA: Diagnosis not present

## 2022-05-21 DIAGNOSIS — J440 Chronic obstructive pulmonary disease with acute lower respiratory infection: Secondary | ICD-10-CM | POA: Diagnosis not present

## 2022-05-21 DIAGNOSIS — E7801 Familial hypercholesterolemia: Secondary | ICD-10-CM | POA: Diagnosis not present

## 2022-05-21 DIAGNOSIS — G8929 Other chronic pain: Secondary | ICD-10-CM | POA: Diagnosis not present

## 2022-05-21 DIAGNOSIS — M810 Age-related osteoporosis without current pathological fracture: Secondary | ICD-10-CM | POA: Diagnosis not present

## 2022-05-21 DIAGNOSIS — I10 Essential (primary) hypertension: Secondary | ICD-10-CM | POA: Diagnosis not present

## 2022-05-21 DIAGNOSIS — E7800 Pure hypercholesterolemia, unspecified: Secondary | ICD-10-CM | POA: Diagnosis not present

## 2022-05-21 DIAGNOSIS — F339 Major depressive disorder, recurrent, unspecified: Secondary | ICD-10-CM | POA: Diagnosis not present

## 2022-05-21 DIAGNOSIS — M549 Dorsalgia, unspecified: Secondary | ICD-10-CM | POA: Diagnosis not present

## 2022-05-25 DIAGNOSIS — F339 Major depressive disorder, recurrent, unspecified: Secondary | ICD-10-CM | POA: Diagnosis not present

## 2022-05-25 DIAGNOSIS — M549 Dorsalgia, unspecified: Secondary | ICD-10-CM | POA: Diagnosis not present

## 2022-06-07 DIAGNOSIS — H25811 Combined forms of age-related cataract, right eye: Secondary | ICD-10-CM | POA: Diagnosis not present

## 2022-06-12 ENCOUNTER — Encounter (HOSPITAL_COMMUNITY)
Admission: RE | Admit: 2022-06-12 | Discharge: 2022-06-12 | Disposition: A | Discharge status: Home / Self Care | Payer: Medicare HMO | Source: Ambulatory Visit | Attending: Ophthalmology | Admitting: Ophthalmology

## 2022-06-12 NOTE — H&P (Signed)
Surgical History & Physical  Patient Name: Laura Abbott DOB: [birth year]  Surgery: Cataract extraction with intraocular lens implant phacoemulsification; Right Eye  Surgeon: Baruch Goldmann MD Surgery Date:  06-15-22 Pre-Op Date:  06-11-22  HPI: A 46 Yr. old female patient is referred by Dr Jorja Loa for cataract eval. 1. 1. The patient complains of Blurry vision, which began 1 year ago. Both eyes are affected. The episode is gradual. The condition's severity increased since last visit. Symptoms occur when the patient is inside, outside and reading. This is negatively affecting the patient's quality of life and the patient is unable to function adequately in life with the current level of vision. HPI was performed by Baruch Goldmann .  Medical History: Cataracts AMD Depression High Blood Pressure LDL Lung Problems  Review of Systems Negative Allergic/Immunologic Negative Cardiovascular Negative Constitutional Negative Ear, Nose, Mouth & Throat Negative Endocrine Negative Eyes Negative Gastrointestinal Negative Genitourinary Negative Hemotologic/Lymphatic Negative Integumentary Negative Musculoskeletal Negative Neurological Negative Psychiatry Negative Respiratory  Social   Former smoker   Medication Preservision, Lisinopril, Metoprolol, Antidepression med,   Sx/Procedures  Lung Cancer Sx, Breast Cancer surgery, Gallbladder Sx,   Drug Allergies   NKDA  History & Physical: Heent: Cataract, right eye NECK: supple without bruits LUNGS: lungs clear to auscultation CV: regular rate and rhythm Abdomen: soft and non-tender Impression & Plan: Assessment: 1.  COMBINED FORMS AGE RELATED CATARACT; Both Eyes (H25.813) 2.  Age-related Macular Degeneration, DRY; Right Eye Intermediate (H35.3112) 3.  BLEPHARITIS; Right Upper Lid, Right Lower Lid, Left Upper Lid, Left Lower Lid (H01.001, H01.002,H01.004,H01.005) 4.  DERMATOCHALASIS, no surgery; Right Upper Lid, Left Upper Lid  (H02.831, H02.834) 5.  ARCUS SENILIS; Both Eyes (H18.413) 6.  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  Regular ASTIGMATISM IRREGULAR/Order Refractive Surgery; Both Eyes (H52.213)  Plan: 1.  Cataract accounts for the patient's decreased vision. This visual impairment is not correctable with a tolerable change in glasses or contact lenses. Cataract surgery with an implantation of a new lens should significantly improve the visual and functional status of the patient. Discussed all risks, benefits, alternatives, and potential complications. Discussed the procedures and recovery. Patient desires to have surgery. A-scan ordered and performed today for intra-ocular lens calculations. The surgery will be performed in order to improve vision for driving, reading, and for eye examinations. Recommend phacoemulsification with intra-ocular lens. Recommend Dextenza for post-operative pain and inflammation. Right Eye worse - first. Dilates well - shugarcaine by protocol. Toric Lens.  2.  Findings, prognosis and treatment options reviewed. Proper use of Amsler grid and AREDS vitamins reviewed. AMD remains dry. Will continue to monitor for progression to wet disease.  3.  Recommend regular lid cleaning  4.  Asymptomatic, recommend observation for now. Findings, prognosis and treatment options reviewed.  5.  Discussed significance of finding  6.  Old Asymptomatic. RD precautions given. Patient to call with increase in flashing lights/floaters/dark curtain. Symptomatic.  7.  Much worse OD, recommend toric IOL OU.

## 2022-06-15 ENCOUNTER — Encounter (HOSPITAL_COMMUNITY): Payer: Self-pay | Admitting: Ophthalmology

## 2022-06-15 ENCOUNTER — Encounter (HOSPITAL_COMMUNITY)
Admission: RE | Disposition: A | Discharge status: Home / Self Care | Payer: Self-pay | Source: Home / Self Care | Attending: Ophthalmology

## 2022-06-15 ENCOUNTER — Ambulatory Visit (HOSPITAL_COMMUNITY): Payer: Medicare HMO | Admitting: Anesthesiology

## 2022-06-15 ENCOUNTER — Ambulatory Visit (HOSPITAL_BASED_OUTPATIENT_CLINIC_OR_DEPARTMENT_OTHER): Payer: Medicare HMO | Admitting: Anesthesiology

## 2022-06-15 ENCOUNTER — Ambulatory Visit (HOSPITAL_COMMUNITY)
Admission: RE | Admit: 2022-06-15 | Discharge: 2022-06-15 | Disposition: A | Discharge status: Home / Self Care | Payer: Medicare HMO | Attending: Ophthalmology | Admitting: Ophthalmology

## 2022-06-15 ENCOUNTER — Other Ambulatory Visit: Payer: Self-pay

## 2022-06-15 DIAGNOSIS — J449 Chronic obstructive pulmonary disease, unspecified: Secondary | ICD-10-CM | POA: Insufficient documentation

## 2022-06-15 DIAGNOSIS — Z79899 Other long term (current) drug therapy: Secondary | ICD-10-CM | POA: Diagnosis not present

## 2022-06-15 DIAGNOSIS — F32A Depression, unspecified: Secondary | ICD-10-CM | POA: Diagnosis not present

## 2022-06-15 DIAGNOSIS — Z87891 Personal history of nicotine dependence: Secondary | ICD-10-CM | POA: Diagnosis not present

## 2022-06-15 DIAGNOSIS — H25811 Combined forms of age-related cataract, right eye: Secondary | ICD-10-CM

## 2022-06-15 DIAGNOSIS — F419 Anxiety disorder, unspecified: Secondary | ICD-10-CM | POA: Diagnosis not present

## 2022-06-15 DIAGNOSIS — I10 Essential (primary) hypertension: Secondary | ICD-10-CM | POA: Insufficient documentation

## 2022-06-15 DIAGNOSIS — Z853 Personal history of malignant neoplasm of breast: Secondary | ICD-10-CM | POA: Diagnosis not present

## 2022-06-15 DIAGNOSIS — K219 Gastro-esophageal reflux disease without esophagitis: Secondary | ICD-10-CM | POA: Insufficient documentation

## 2022-06-15 DIAGNOSIS — H269 Unspecified cataract: Secondary | ICD-10-CM

## 2022-06-15 DIAGNOSIS — Z85118 Personal history of other malignant neoplasm of bronchus and lung: Secondary | ICD-10-CM | POA: Diagnosis not present

## 2022-06-15 DIAGNOSIS — M1990 Unspecified osteoarthritis, unspecified site: Secondary | ICD-10-CM | POA: Insufficient documentation

## 2022-06-15 HISTORY — PX: CATARACT EXTRACTION W/PHACO: SHX586

## 2022-06-15 SURGERY — PHACOEMULSIFICATION, CATARACT, WITH IOL INSERTION
Anesthesia: Monitor Anesthesia Care | Site: Eye | Laterality: Right

## 2022-06-15 MED ORDER — LIDOCAINE HCL 3.5 % OP GEL: 1.0000 | Freq: Once | OPHTHALMIC | Status: DC

## 2022-06-15 MED ORDER — TROPICAMIDE 1 % OP SOLN
1.0000 [drp] | OPHTHALMIC | Status: AC | PRN
  Administered 2022-06-15 (×3): 1 [drp] via OPHTHALMIC

## 2022-06-15 MED ORDER — SODIUM HYALURONATE 10 MG/ML IO SOLUTION
PREFILLED_SYRINGE | INTRAOCULAR | Status: DC | PRN
  Administered 2022-06-15: 0.85 mL via INTRAOCULAR

## 2022-06-15 MED ORDER — BSS IO SOLN
INTRAOCULAR | Status: DC | PRN
  Administered 2022-06-15: 15 mL via INTRAOCULAR

## 2022-06-15 MED ORDER — TETRACAINE HCL 0.5 % OP SOLN
1.0000 [drp] | OPHTHALMIC | Status: AC | PRN
  Administered 2022-06-15 (×3): 1 [drp] via OPHTHALMIC

## 2022-06-15 MED ORDER — STERILE WATER FOR IRRIGATION IR SOLN
Status: DC | PRN
  Administered 2022-06-15: 250 mL

## 2022-06-15 MED ORDER — POVIDONE-IODINE 5 % OP SOLN
OPHTHALMIC | Status: DC | PRN
  Administered 2022-06-15: 1 via OPHTHALMIC

## 2022-06-15 MED ORDER — MIDAZOLAM HCL 2 MG/2ML IJ SOLN
INTRAMUSCULAR | Status: DC | PRN
  Administered 2022-06-15: 1 mg via INTRAVENOUS

## 2022-06-15 MED ORDER — EPINEPHRINE PF 1 MG/ML IJ SOLN
INTRAOCULAR | Status: DC | PRN
  Administered 2022-06-15: 500 mL

## 2022-06-15 MED ORDER — SODIUM CHLORIDE 0.9% FLUSH
INTRAVENOUS | Status: DC | PRN
  Administered 2022-06-15: 5 mL via INTRAVENOUS

## 2022-06-15 MED ORDER — PHENYLEPHRINE HCL 2.5 % OP SOLN
1.0000 [drp] | OPHTHALMIC | Status: AC | PRN
  Administered 2022-06-15 (×3): 1 [drp] via OPHTHALMIC

## 2022-06-15 MED ORDER — MIDAZOLAM HCL 2 MG/2ML IJ SOLN
INTRAMUSCULAR | Status: AC
  Filled 2022-06-15: qty 2

## 2022-06-15 MED ORDER — LIDOCAINE HCL (PF) 1 % IJ SOLN
INTRAOCULAR | Status: DC | PRN
  Administered 2022-06-15: 1 mL via OPHTHALMIC

## 2022-06-15 MED ORDER — NEOMYCIN-POLYMYXIN-DEXAMETH 3.5-10000-0.1 OP SUSP
OPHTHALMIC | Status: DC | PRN
  Administered 2022-06-15: 1 [drp] via OPHTHALMIC

## 2022-06-15 MED ORDER — EPINEPHRINE PF 1 MG/ML IJ SOLN
INTRAMUSCULAR | Status: AC
  Filled 2022-06-15: qty 2

## 2022-06-15 MED ORDER — SODIUM HYALURONATE 23MG/ML IO SOSY
PREFILLED_SYRINGE | INTRAOCULAR | Status: DC | PRN
  Administered 2022-06-15: 0.6 mL via INTRAOCULAR

## 2022-06-15 SURGICAL SUPPLY — 14 items
CATARACT SUITE SIGHTPATH (MISCELLANEOUS) ×2 IMPLANT
CLOTH BEACON ORANGE TIMEOUT ST (SAFETY) ×2 IMPLANT
EYE SHIELD UNIVERSAL CLEAR (GAUZE/BANDAGES/DRESSINGS) ×1 IMPLANT
FEE CATARACT SUITE SIGHTPATH (MISCELLANEOUS) ×1 IMPLANT
GLOVE BIOGEL PI IND STRL 7.0 (GLOVE) ×2 IMPLANT
GLOVE BIOGEL PI INDICATOR 7.0 (GLOVE) ×1
GLOVE SURG SS PI 7.0 STRL IVOR (GLOVE) ×1 IMPLANT
LENS IOL RAYNER 20.5 (Intraocular Lens) ×2 IMPLANT
LENS IOL RAYONE EMV 20.5 (Intraocular Lens) IMPLANT
PAD ARMBOARD 7.5X6 YLW CONV (MISCELLANEOUS) ×2 IMPLANT
SYR TB 1ML LL NO SAFETY (SYRINGE) ×2 IMPLANT
TAPE SURG TRANSPORE 1 IN (GAUZE/BANDAGES/DRESSINGS) IMPLANT
TAPE SURGICAL TRANSPORE 1 IN (GAUZE/BANDAGES/DRESSINGS) ×2
WATER STERILE IRR 250ML POUR (IV SOLUTION) ×2 IMPLANT

## 2022-06-15 NOTE — Anesthesia Preprocedure Evaluation (Signed)
Anesthesia Evaluation  Patient identified by MRN, date of birth, ID band Patient awake    Reviewed: Allergy & Precautions, NPO status , Patient's Chart, lab work & pertinent test results, reviewed documented beta blocker date and time   Airway Mallampati: II  TM Distance: >3 FB Neck ROM: Full    Dental  (+) Dental Advisory Given, Upper Dentures, Partial Lower   Pulmonary shortness of breath and with exertion, asthma , pneumonia, COPD,  COPD inhaler, former smoker,  Lung cancer   Pulmonary exam normal breath sounds clear to auscultation       Cardiovascular hypertension, Pt. on medications and Pt. on home beta blockers Normal cardiovascular exam Rhythm:Regular Rate:Normal     Neuro/Psych PSYCHIATRIC DISORDERS Anxiety Depression  Neuromuscular disease    GI/Hepatic Neg liver ROS, GERD  Medicated and Controlled,  Endo/Other  negative endocrine ROS  Renal/GU negative Renal ROS  negative genitourinary   Musculoskeletal  (+) Arthritis , Osteoarthritis,    Abdominal   Peds negative pediatric ROS (+)  Hematology negative hematology ROS (+)   Anesthesia Other Findings Right breast cancer  Reproductive/Obstetrics negative OB ROS                            Anesthesia Physical Anesthesia Plan  ASA: 3  Anesthesia Plan: MAC   Post-op Pain Management: Minimal or no pain anticipated   Induction:   PONV Risk Score and Plan:   Airway Management Planned: Nasal Cannula and Natural Airway  Additional Equipment:   Intra-op Plan:   Post-operative Plan:   Informed Consent: I have reviewed the patients History and Physical, chart, labs and discussed the procedure including the risks, benefits and alternatives for the proposed anesthesia with the patient or authorized representative who has indicated his/her understanding and acceptance.     Dental advisory given  Plan Discussed with: CRNA and  Surgeon  Anesthesia Plan Comments:         Anesthesia Quick Evaluation

## 2022-06-15 NOTE — Anesthesia Postprocedure Evaluation (Signed)
Anesthesia Post Note  Patient: Laura Abbott  Procedure(s) Performed: CATARACT EXTRACTION PHACO AND INTRAOCULAR LENS PLACEMENT (IOC) (Right: Eye)  Patient location during evaluation: Phase II Anesthesia Type: MAC Level of consciousness: awake and alert and oriented Pain management: pain level controlled Vital Signs Assessment: post-procedure vital signs reviewed and stable Respiratory status: spontaneous breathing, nonlabored ventilation and respiratory function stable Cardiovascular status: stable and blood pressure returned to baseline Postop Assessment: no apparent nausea or vomiting Anesthetic complications: no   No notable events documented.   Last Vitals:  Vitals:   06/15/22 0803 06/15/22 0913  BP: (!) 146/61 (!) 145/59  Pulse: 68 72  Resp: 17 16  Temp: 36.5 C 36.7 C  SpO2: 92% 96%    Last Pain:  Vitals:   06/15/22 0913  TempSrc: Oral  PainSc: 0-No pain                 Jennalyn Cawley C Jhonnie Aliano

## 2022-06-15 NOTE — Discharge Instructions (Signed)
Please discharge patient when stable, will follow up today with Dr. Marisa Hua at the [facility] office at 10:20AM following discharge.  Leave shield in place until visit.  All paperwork with discharge instructions will be given at the office.  [facility] Address:  [address]  [place], [place] [address]

## 2022-06-15 NOTE — Op Note (Signed)
Date of procedure: 06/15/22  Pre-operative diagnosis:  Visually significant combined form age-related cataract, Right Eye (H25.811)  Post-operative diagnosis:  Visually significant combined form age-related cataract, Right Eye (H25.811)  Procedure: Removal of cataract via phacoemulsification and insertion of intra-ocular lens Rayner RAO200E +20.5D into the capsular bag of the Right Eye  Attending surgeon: Gerda Diss. Jassen Sarver, MD, MA  Anesthesia: MAC, Topical Akten  Complications: None  Estimated Blood Loss: <27m (minimal)  Specimens: None  Implants: As above  Indications:  Visually significant age-related cataract, Right Eye  Procedure:  The patient was seen and identified in the pre-operative area. The operative eye was identified and dilated.  The operative eye was marked.  Topical anesthesia was administered to the operative eye.     The patient was then to the operative suite and placed in the supine position.  A timeout was performed confirming the patient, procedure to be performed, and all other relevant information.   The patient's face was prepped and draped in the usual fashion for intra-ocular surgery.  A lid speculum was placed into the operative eye and the surgical microscope moved into place and focused.  A superotemporal paracentesis was created using a 20 gauge paracentesis blade.  Shugarcaine was injected into the anterior chamber.  Viscoelastic was injected into the anterior chamber.  A temporal clear-corneal main wound incision was created using a 2.481mmicrokeratome.  A continuous curvilinear capsulorrhexis was initiated using an irrigating cystitome and completed using capsulorrhexis forceps.  Hydrodissection and hydrodeliniation were performed.  Viscoelastic was injected into the anterior chamber.  A phacoemulsification handpiece and a chopper as a second instrument were used to remove the nucleus and epinucleus. The irrigation/aspiration handpiece was used to remove any  remaining cortical material.   The capsular bag was reinflated with viscoelastic, checked, and found to be intact.  The intraocular lens was inserted into the capsular bag.  The irrigation/aspiration handpiece was used to remove any remaining viscoelastic.  The clear corneal wound and paracentesis wounds were then hydrated and checked with Weck-Cels to be watertight.  The lid-speculum was removed.  The drape was removed.  The patient's face was cleaned with a wet and dry 4x4.   Maxitrol was instilled in the eye. A clear shield was taped over the eye. The patient was taken to the post-operative care unit in good condition, having tolerated the procedure well.  Post-Op Instructions: [facility] and will receive all other orders and instructions.

## 2022-06-15 NOTE — Transfer of Care (Signed)
Immediate Anesthesia Transfer of Care Note  Patient: Karen Kays  Procedure(s) Performed: CATARACT EXTRACTION PHACO AND INTRAOCULAR LENS PLACEMENT (IOC) (Right: Eye)  Patient Location: Short Stay  Anesthesia Type:MAC  Level of Consciousness: awake, alert  and oriented  Airway & Oxygen Therapy: Patient Spontanous Breathing  Post-op Assessment: Report given to RN and Post -op Vital signs reviewed and stable  Post vital signs: Reviewed and stable  Last Vitals:  Vitals Value Taken Time  BP 145/59 06/15/22 0913  Temp 36.7 C 06/15/22 0913  Pulse 72 06/15/22 0913  Resp 16 06/15/22 0913  SpO2 96 % 06/15/22 0913    Last Pain:  Vitals:   06/15/22 0913  TempSrc: Oral  PainSc: 0-No pain         Complications: No notable events documented.

## 2022-06-15 NOTE — Anesthesia Procedure Notes (Signed)
Procedure Name: MAC Date/Time: 06/15/2022 8:57 AM  Performed by: Orlie Dakin, CRNAPre-anesthesia Checklist: Patient identified, Emergency Drugs available, Suction available and Patient being monitored Patient Re-evaluated:Patient Re-evaluated prior to induction Oxygen Delivery Method: Nasal cannula Placement Confirmation: positive ETCO2

## 2022-06-15 NOTE — Interval H&P Note (Signed)
History and Physical Interval Note:  06/15/2022 8:49 AM  Laura Abbott  has presented today for surgery, with the diagnosis of combined forms age related cataract; right.  The various methods of treatment have been discussed with the patient and family. After consideration of risks, benefits and other options for treatment, the patient has consented to  Procedure(s) with comments: CATARACT EXTRACTION PHACO AND INTRAOCULAR LENS PLACEMENT (IOC) (Right) - right as a surgical intervention.  The patient's history has been reviewed, patient examined, no change in status, stable for surgery.  I have reviewed the patient's chart and labs.  Questions were answered to the patient's satisfaction.     Baruch Goldmann

## 2022-06-18 ENCOUNTER — Encounter (HOSPITAL_COMMUNITY): Payer: Self-pay | Admitting: Ophthalmology

## 2022-06-21 DIAGNOSIS — H25812 Combined forms of age-related cataract, left eye: Secondary | ICD-10-CM | POA: Diagnosis not present

## 2022-06-25 NOTE — H&P (Signed)
Surgical History & Physical  Patient Name: Laura Abbott DOB: 27-Jan-1946  Surgery: Cataract extraction with intraocular lens implant phacoemulsification; Left Eye  Surgeon: Baruch Goldmann MD Surgery Date:  06-29-22 Pre-Op Date:  06-21-22  HPI: A 64 Yr. old female patient 1. The patient is returning after cataract post-op. The right eye is affected. Status post cataract post-op, which began 1 week ago: Since the last visit, the affected area is doing well. The patient's vision is improved. Patient is following medication instructions. Pt reports blurry vision in left eye staretd 1 year ago. This is negatively affecting the patient's quality of life and the patient is unable to function adequately in life with the current level of vision. HPI was performed by Baruch Goldmann .  Medical History: Cataracts AMD Depression High Blood Pressure LDL Lung Problems  Review of Systems Negative Allergic/Immunologic Negative Cardiovascular Negative Constitutional Negative Ear, Nose, Mouth & Throat Negative Endocrine Negative Eyes Negative Gastrointestinal Negative Genitourinary Negative Hemotologic/Lymphatic Negative Integumentary Negative Musculoskeletal Negative Neurological Negative Psychiatry Negative Respiratory  Social   Former smoker   Medication Preservision, Prednisolone-Moxifloxacin-Bromfenac,  Lisinopril, Metoprolol, Antidepression med,   Sx/Procedures Phaco c IOL OD,  Lung Cancer Sx, Breast Cancer surgery, Gallbladder Sx,   Drug Allergies   NKDA  History & Physical: Heent: Cataract, left eye NECK: supple without bruits LUNGS: lungs clear to auscultation CV: regular rate and rhythm Abdomen: soft and non-tender Impression & Plan: Assessment: 1.  CATARACT EXTRACTION STATUS; Right Eye (Z98.41) 2.  COMBINED FORMS AGE RELATED CATARACT; Left Eye (H25.812)  Plan: 1.  1 week after cataract surgery. Doing well with improved vision and normal eye pressure. Call with any  problems or concerns. Continue Pred-Moxi-Brom 2x/day for 3 more weeks.  2.  Cataract accounts for the patient's decreased vision. This visual impairment is not correctable with a tolerable change in glasses or contact lenses. Cataract surgery with an implantation of a new lens should significantly improve the visual and functional status of the patient. Discussed all risks, benefits, alternatives, and potential complications. Discussed the procedures and recovery. Patient desires to have surgery. A-scan ordered and performed today for intra-ocular lens calculations. The surgery will be performed in order to improve vision for driving, reading, and for eye examinations. Recommend phacoemulsification with intra-ocular lens. Recommend Dextenza for post-operative pain and inflammation. Left Eye. Surgery required to correct imbalance of vision. Dilates well - shugarcaine by protocol.

## 2022-06-26 ENCOUNTER — Encounter (HOSPITAL_COMMUNITY)
Admission: RE | Admit: 2022-06-26 | Discharge: 2022-06-26 | Disposition: A | Discharge status: Home / Self Care | Payer: Medicare HMO | Source: Ambulatory Visit | Attending: Ophthalmology | Admitting: Ophthalmology

## 2022-06-29 ENCOUNTER — Ambulatory Visit (HOSPITAL_COMMUNITY): Payer: Medicare HMO | Admitting: Certified Registered Nurse Anesthetist

## 2022-06-29 ENCOUNTER — Encounter (HOSPITAL_COMMUNITY): Payer: Self-pay | Admitting: Ophthalmology

## 2022-06-29 ENCOUNTER — Ambulatory Visit (HOSPITAL_COMMUNITY)
Admission: RE | Admit: 2022-06-29 | Discharge: 2022-06-29 | Disposition: A | Discharge status: Home / Self Care | Payer: Medicare HMO | Attending: Ophthalmology | Admitting: Ophthalmology

## 2022-06-29 ENCOUNTER — Encounter (HOSPITAL_COMMUNITY)
Admission: RE | Disposition: A | Discharge status: Home / Self Care | Payer: Self-pay | Source: Home / Self Care | Attending: Ophthalmology

## 2022-06-29 ENCOUNTER — Ambulatory Visit (HOSPITAL_BASED_OUTPATIENT_CLINIC_OR_DEPARTMENT_OTHER): Payer: Medicare HMO | Admitting: Certified Registered Nurse Anesthetist

## 2022-06-29 DIAGNOSIS — J449 Chronic obstructive pulmonary disease, unspecified: Secondary | ICD-10-CM

## 2022-06-29 DIAGNOSIS — I10 Essential (primary) hypertension: Secondary | ICD-10-CM

## 2022-06-29 DIAGNOSIS — K219 Gastro-esophageal reflux disease without esophagitis: Secondary | ICD-10-CM | POA: Insufficient documentation

## 2022-06-29 DIAGNOSIS — Z87891 Personal history of nicotine dependence: Secondary | ICD-10-CM | POA: Insufficient documentation

## 2022-06-29 DIAGNOSIS — H25812 Combined forms of age-related cataract, left eye: Secondary | ICD-10-CM | POA: Insufficient documentation

## 2022-06-29 DIAGNOSIS — Z79899 Other long term (current) drug therapy: Secondary | ICD-10-CM | POA: Insufficient documentation

## 2022-06-29 DIAGNOSIS — Z48810 Encounter for surgical aftercare following surgery on the sense organs: Secondary | ICD-10-CM | POA: Diagnosis not present

## 2022-06-29 DIAGNOSIS — Z853 Personal history of malignant neoplasm of breast: Secondary | ICD-10-CM | POA: Diagnosis not present

## 2022-06-29 DIAGNOSIS — F32A Depression, unspecified: Secondary | ICD-10-CM | POA: Insufficient documentation

## 2022-06-29 HISTORY — PX: CATARACT EXTRACTION W/PHACO: SHX586

## 2022-06-29 SURGERY — PHACOEMULSIFICATION, CATARACT, WITH IOL INSERTION
Anesthesia: Monitor Anesthesia Care | Site: Eye | Laterality: Left

## 2022-06-29 MED ORDER — MIDAZOLAM HCL 2 MG/2ML IJ SOLN
INTRAMUSCULAR | Status: AC
  Filled 2022-06-29: qty 2

## 2022-06-29 MED ORDER — EPINEPHRINE PF 1 MG/ML IJ SOLN
INTRAMUSCULAR | Status: AC
  Filled 2022-06-29: qty 2

## 2022-06-29 MED ORDER — EPINEPHRINE PF 1 MG/ML IJ SOLN
INTRAOCULAR | Status: DC | PRN
  Administered 2022-06-29: 500 mL

## 2022-06-29 MED ORDER — SODIUM HYALURONATE 10 MG/ML IO SOLUTION
PREFILLED_SYRINGE | INTRAOCULAR | Status: DC | PRN
  Administered 2022-06-29: 0.85 mL via INTRAOCULAR

## 2022-06-29 MED ORDER — STERILE WATER FOR IRRIGATION IR SOLN
Status: DC | PRN
  Administered 2022-06-29: 250 mL

## 2022-06-29 MED ORDER — POVIDONE-IODINE 5 % OP SOLN
OPHTHALMIC | Status: DC | PRN
  Administered 2022-06-29: 1 via OPHTHALMIC

## 2022-06-29 MED ORDER — LIDOCAINE HCL 3.5 % OP GEL
1.0000 | Freq: Once | OPHTHALMIC | Status: AC
  Administered 2022-06-29: 1 via OPHTHALMIC

## 2022-06-29 MED ORDER — BSS IO SOLN
INTRAOCULAR | Status: DC | PRN
  Administered 2022-06-29: 15 mL via INTRAOCULAR

## 2022-06-29 MED ORDER — TROPICAMIDE 1 % OP SOLN
1.0000 [drp] | OPHTHALMIC | Status: AC | PRN
  Administered 2022-06-29 (×3): 1 [drp] via OPHTHALMIC

## 2022-06-29 MED ORDER — SODIUM CHLORIDE 0.9% FLUSH
INTRAVENOUS | Status: DC | PRN
  Administered 2022-06-29: 5 mL via INTRAVENOUS

## 2022-06-29 MED ORDER — TETRACAINE HCL 0.5 % OP SOLN
1.0000 [drp] | OPHTHALMIC | Status: AC | PRN
  Administered 2022-06-29 (×3): 1 [drp] via OPHTHALMIC

## 2022-06-29 MED ORDER — MIDAZOLAM HCL 5 MG/5ML IJ SOLN
INTRAMUSCULAR | Status: DC | PRN
  Administered 2022-06-29: 1 mg via INTRAVENOUS

## 2022-06-29 MED ORDER — SODIUM HYALURONATE 23MG/ML IO SOSY
PREFILLED_SYRINGE | INTRAOCULAR | Status: DC | PRN
  Administered 2022-06-29: 0.6 mL via INTRAOCULAR

## 2022-06-29 MED ORDER — PHENYLEPHRINE HCL 2.5 % OP SOLN
1.0000 [drp] | OPHTHALMIC | Status: AC | PRN
  Administered 2022-06-29 (×3): 1 [drp] via OPHTHALMIC

## 2022-06-29 MED ORDER — NEOMYCIN-POLYMYXIN-DEXAMETH 3.5-10000-0.1 OP SUSP
OPHTHALMIC | Status: DC | PRN
  Administered 2022-06-29: 2 [drp] via OPHTHALMIC

## 2022-06-29 MED ORDER — LIDOCAINE HCL (PF) 1 % IJ SOLN
INTRAOCULAR | Status: DC | PRN
  Administered 2022-06-29: 1 mL via OPHTHALMIC

## 2022-06-29 SURGICAL SUPPLY — 15 items
CATARACT SUITE SIGHTPATH (MISCELLANEOUS) ×2 IMPLANT
CLOTH BEACON ORANGE TIMEOUT ST (SAFETY) ×2 IMPLANT
EYE SHIELD UNIVERSAL CLEAR (GAUZE/BANDAGES/DRESSINGS) ×1 IMPLANT
FEE CATARACT SUITE SIGHTPATH (MISCELLANEOUS) ×1 IMPLANT
GLOVE BIOGEL PI IND STRL 6.5 (GLOVE) IMPLANT
GLOVE BIOGEL PI IND STRL 7.0 (GLOVE) ×2 IMPLANT
GLOVE BIOGEL PI INDICATOR 6.5 (GLOVE) ×1
GLOVE BIOGEL PI INDICATOR 7.0 (GLOVE) ×2
LENS IOL RAYNER 19.5 (Intraocular Lens) ×2 IMPLANT
LENS IOL RAYONE EMV 19.5 (Intraocular Lens) IMPLANT
PAD ARMBOARD 7.5X6 YLW CONV (MISCELLANEOUS) ×2 IMPLANT
SYR TB 1ML LL NO SAFETY (SYRINGE) ×2 IMPLANT
TAPE SURG TRANSPORE 1 IN (GAUZE/BANDAGES/DRESSINGS) IMPLANT
TAPE SURGICAL TRANSPORE 1 IN (GAUZE/BANDAGES/DRESSINGS) ×2
WATER STERILE IRR 250ML POUR (IV SOLUTION) ×2 IMPLANT

## 2022-06-29 NOTE — Transfer of Care (Signed)
Immediate Anesthesia Transfer of Care Note  Patient: Laura Abbott  Procedure(s) Performed: CATARACT EXTRACTION PHACO AND INTRAOCULAR LENS PLACEMENT (IOC) (Left: Eye)  Patient Location: PACU  Anesthesia Type:MAC  Level of Consciousness: awake, alert  and oriented  Airway & Oxygen Therapy: Patient Spontanous Breathing  Post-op Assessment: Report given to RN and Post -op Vital signs reviewed and stable  Post vital signs: Reviewed and stable  Last Vitals:  Vitals Value Taken Time  BP 142/78   Temp    Pulse 72   Resp 15   SpO2 96%     Last Pain:  Vitals:   06/29/22 1002  TempSrc: Oral  PainSc: 9       Patients Stated Pain Goal: 9 (33/29/51 8841)  Complications: No notable events documented.

## 2022-06-29 NOTE — Op Note (Signed)
Date of procedure: 06/29/22  Pre-operative diagnosis: Visually significant age-related combined cataract, Left Eye (H25.812)  Post-operative diagnosis: Visually significant age-related combined cataract, Left Eye (H25.812)  Procedure: Removal of cataract via phacoemulsification and insertion of intra-ocular lens Rayner RAO200E +19.5D into the capsular bag of the Left Eye  Attending surgeon: Gerda Diss. Yosiel Thieme, MD, MA  Anesthesia: MAC, Topical Akten  Complications: None  Estimated Blood Loss: <71m (minimal)  Specimens: None  Implants: As above  Indications:  Visually significant age-related cataract, Left Eye  Procedure:  The patient was seen and identified in the pre-operative area. The operative eye was identified and dilated.  The operative eye was marked.  Topical anesthesia was administered to the operative eye.     The patient was then to the operative suite and placed in the supine position.  A timeout was performed confirming the patient, procedure to be performed, and all other relevant information.   The patient's face was prepped and draped in the usual fashion for intra-ocular surgery.  A lid speculum was placed into the operative eye and the surgical microscope moved into place and focused.  An inferotemporal paracentesis was created using a 20 gauge paracentesis blade.  Shugarcaine was injected into the anterior chamber.  Viscoelastic was injected into the anterior chamber.  A temporal clear-corneal main wound incision was created using a 2.451mmicrokeratome.  A continuous curvilinear capsulorrhexis was initiated using an irrigating cystitome and completed using capsulorrhexis forceps.  Hydrodissection and hydrodeliniation were performed.  Viscoelastic was injected into the anterior chamber.  A phacoemulsification handpiece and a chopper as a second instrument were used to remove the nucleus and epinucleus. The irrigation/aspiration handpiece was used to remove any remaining  cortical material.   The capsular bag was reinflated with viscoelastic, checked, and found to be intact.  The intraocular lens was inserted into the capsular bag.  The irrigation/aspiration handpiece was used to remove any remaining viscoelastic.  The clear corneal wound and paracentesis wounds were then hydrated and checked with Weck-Cels to be watertight.  Maxitrol was instilled in the eye. The lid-speculum was removed.  The drape was removed.  The patient's face was cleaned with a wet and dry 4x4.    A clear shield was taped over the eye. The patient was taken to the post-operative care unit in good condition, having tolerated the procedure well.  Post-Op Instructions: [facility] and will receive all other orders and instructions.

## 2022-06-29 NOTE — Discharge Instructions (Signed)
Please discharge patient when stable, will follow up today with Dr. Shigeo Baugh at the [facility] office immediately following discharge.  Leave shield in place until visit.  All paperwork with discharge instructions will be given at the office.  [facility] Address:  [address]  [place], [place] [address]  

## 2022-06-29 NOTE — Anesthesia Preprocedure Evaluation (Signed)
Anesthesia Evaluation  Patient identified by MRN, date of birth, ID band Patient awake    Reviewed: Allergy & Precautions, NPO status , Patient's Chart, lab work & pertinent test results, reviewed documented beta blocker date and time   Airway Mallampati: II  TM Distance: >3 FB Neck ROM: Full    Dental  (+) Dental Advisory Given, Upper Dentures, Partial Lower   Pulmonary shortness of breath and with exertion, asthma , pneumonia, COPD,  COPD inhaler, former smoker,  Lung cancer   Pulmonary exam normal breath sounds clear to auscultation       Cardiovascular hypertension, Pt. on medications and Pt. on home beta blockers Normal cardiovascular exam Rhythm:Regular Rate:Normal     Neuro/Psych PSYCHIATRIC DISORDERS Anxiety Depression  Neuromuscular disease    GI/Hepatic Neg liver ROS, GERD  Medicated and Controlled,  Endo/Other  negative endocrine ROS  Renal/GU negative Renal ROS  negative genitourinary   Musculoskeletal  (+) Arthritis , Osteoarthritis,    Abdominal   Peds negative pediatric ROS (+)  Hematology negative hematology ROS (+)   Anesthesia Other Findings Right breast cancer  Reproductive/Obstetrics negative OB ROS                             Anesthesia Physical  Anesthesia Plan  ASA: 3  Anesthesia Plan: MAC   Post-op Pain Management: Minimal or no pain anticipated   Induction:   PONV Risk Score and Plan:   Airway Management Planned: Nasal Cannula and Natural Airway  Additional Equipment:   Intra-op Plan:   Post-operative Plan:   Informed Consent: I have reviewed the patients History and Physical, chart, labs and discussed the procedure including the risks, benefits and alternatives for the proposed anesthesia with the patient or authorized representative who has indicated his/her understanding and acceptance.     Dental advisory given  Plan Discussed with: CRNA and  Surgeon  Anesthesia Plan Comments:         Anesthesia Quick Evaluation

## 2022-06-29 NOTE — Anesthesia Postprocedure Evaluation (Signed)
Anesthesia Post Note  Patient: ANABIA WEATHERWAX  Procedure(s) Performed: CATARACT EXTRACTION PHACO AND INTRAOCULAR LENS PLACEMENT (IOC) (Left: Eye)  Patient location during evaluation: Phase II Anesthesia Type: MAC Level of consciousness: awake Pain management: pain level controlled Vital Signs Assessment: post-procedure vital signs reviewed and stable Respiratory status: spontaneous breathing and respiratory function stable Cardiovascular status: blood pressure returned to baseline and stable Postop Assessment: no headache and no apparent nausea or vomiting Anesthetic complications: no Comments: Late entry   No notable events documented.   Last Vitals:  Vitals:   06/29/22 1002 06/29/22 1132  BP: 135/79 (!) 141/71  Pulse: 74 79  Resp: 20 16  Temp: 37 C 36.8 C  SpO2: 92% 93%    Last Pain:  Vitals:   06/29/22 1132  TempSrc: Oral  PainSc: 0-No pain                 Louann Sjogren

## 2022-06-29 NOTE — Interval H&P Note (Signed)
History and Physical Interval Note:  06/29/2022 11:07 AM  Karen Kays  has presented today for surgery, with the diagnosis of combined forms age related cataract; left.  The various methods of treatment have been discussed with the patient and family. After consideration of risks, benefits and other options for treatment, the patient has consented to  Procedure(s) with comments: CATARACT EXTRACTION PHACO AND INTRAOCULAR LENS PLACEMENT (IOC) (Left) - CDE:  as a surgical intervention.  The patient's history has been reviewed, patient examined, no change in status, stable for surgery.  I have reviewed the patient's chart and labs.  Questions were answered to the patient's satisfaction.     Baruch Goldmann

## 2022-07-02 ENCOUNTER — Encounter (HOSPITAL_COMMUNITY): Payer: Self-pay | Admitting: Ophthalmology

## 2022-07-13 ENCOUNTER — Ambulatory Visit: Payer: Medicare HMO | Admitting: Cardiology

## 2022-07-26 DIAGNOSIS — M542 Cervicalgia: Secondary | ICD-10-CM | POA: Diagnosis not present

## 2022-07-31 ENCOUNTER — Telehealth: Payer: Self-pay | Admitting: *Deleted

## 2022-07-31 NOTE — Patient Outreach (Signed)
  Care Coordination   07/31/2022 Name: Laura Abbott MRN: [id number] DOB: 10/21/1946   Care Coordination Outreach Attempts:  An unsuccessful telephone outreach was attempted today to offer the patient information about available care coordination services as a benefit of their health plan.   Follow Up Plan:  No further outreach attempts will be made at this time. We have been unable to contact the patient to offer or enroll patient in care coordination services  Number provided is inaccurate.  Encounter Outcome:  No Answer  Care Coordination Interventions Activated:  No   Care Coordination Interventions:  No, not indicated    Valente David, RN, MSN, [facility] Care Management Care Management Coordinator [phone]

## 2022-08-02 ENCOUNTER — Encounter (HOSPITAL_COMMUNITY): Payer: Medicare HMO

## 2022-08-02 ENCOUNTER — Inpatient Hospital Stay: Payer: Medicare HMO | Attending: Hematology

## 2022-08-13 DIAGNOSIS — M47816 Spondylosis without myelopathy or radiculopathy, lumbar region: Secondary | ICD-10-CM | POA: Diagnosis not present

## 2022-08-13 DIAGNOSIS — M9903 Segmental and somatic dysfunction of lumbar region: Secondary | ICD-10-CM | POA: Diagnosis not present

## 2022-08-14 DIAGNOSIS — M47816 Spondylosis without myelopathy or radiculopathy, lumbar region: Secondary | ICD-10-CM | POA: Diagnosis not present

## 2022-08-14 DIAGNOSIS — M9903 Segmental and somatic dysfunction of lumbar region: Secondary | ICD-10-CM | POA: Diagnosis not present

## 2022-08-15 DIAGNOSIS — Z1329 Encounter for screening for other suspected endocrine disorder: Secondary | ICD-10-CM | POA: Diagnosis not present

## 2022-08-15 DIAGNOSIS — E7801 Familial hypercholesterolemia: Secondary | ICD-10-CM | POA: Diagnosis not present

## 2022-08-15 DIAGNOSIS — Z85118 Personal history of other malignant neoplasm of bronchus and lung: Secondary | ICD-10-CM | POA: Diagnosis not present

## 2022-08-15 DIAGNOSIS — K219 Gastro-esophageal reflux disease without esophagitis: Secondary | ICD-10-CM | POA: Diagnosis not present

## 2022-08-15 DIAGNOSIS — M810 Age-related osteoporosis without current pathological fracture: Secondary | ICD-10-CM | POA: Diagnosis not present

## 2022-08-15 DIAGNOSIS — Z131 Encounter for screening for diabetes mellitus: Secondary | ICD-10-CM | POA: Diagnosis not present

## 2022-08-15 DIAGNOSIS — I10 Essential (primary) hypertension: Secondary | ICD-10-CM | POA: Diagnosis not present

## 2022-08-15 DIAGNOSIS — J449 Chronic obstructive pulmonary disease, unspecified: Secondary | ICD-10-CM | POA: Diagnosis not present

## 2022-08-15 DIAGNOSIS — E559 Vitamin D deficiency, unspecified: Secondary | ICD-10-CM | POA: Diagnosis not present

## 2022-08-17 DIAGNOSIS — M47816 Spondylosis without myelopathy or radiculopathy, lumbar region: Secondary | ICD-10-CM | POA: Diagnosis not present

## 2022-08-17 DIAGNOSIS — M9903 Segmental and somatic dysfunction of lumbar region: Secondary | ICD-10-CM | POA: Diagnosis not present

## 2022-08-20 DIAGNOSIS — J440 Chronic obstructive pulmonary disease with acute lower respiratory infection: Secondary | ICD-10-CM | POA: Diagnosis not present

## 2022-08-20 DIAGNOSIS — Z0001 Encounter for general adult medical examination with abnormal findings: Secondary | ICD-10-CM | POA: Diagnosis not present

## 2022-08-20 DIAGNOSIS — M47816 Spondylosis without myelopathy or radiculopathy, lumbar region: Secondary | ICD-10-CM | POA: Diagnosis not present

## 2022-08-20 DIAGNOSIS — M9903 Segmental and somatic dysfunction of lumbar region: Secondary | ICD-10-CM | POA: Diagnosis not present

## 2022-08-20 DIAGNOSIS — E7801 Familial hypercholesterolemia: Secondary | ICD-10-CM | POA: Diagnosis not present

## 2022-08-20 DIAGNOSIS — Z85118 Personal history of other malignant neoplasm of bronchus and lung: Secondary | ICD-10-CM | POA: Diagnosis not present

## 2022-08-20 DIAGNOSIS — M810 Age-related osteoporosis without current pathological fracture: Secondary | ICD-10-CM | POA: Diagnosis not present

## 2022-08-20 DIAGNOSIS — M542 Cervicalgia: Secondary | ICD-10-CM | POA: Diagnosis not present

## 2022-08-20 DIAGNOSIS — M549 Dorsalgia, unspecified: Secondary | ICD-10-CM | POA: Diagnosis not present

## 2022-08-20 DIAGNOSIS — G8929 Other chronic pain: Secondary | ICD-10-CM | POA: Diagnosis not present

## 2022-08-20 DIAGNOSIS — F339 Major depressive disorder, recurrent, unspecified: Secondary | ICD-10-CM | POA: Diagnosis not present

## 2022-08-23 DIAGNOSIS — M9903 Segmental and somatic dysfunction of lumbar region: Secondary | ICD-10-CM | POA: Diagnosis not present

## 2022-08-23 DIAGNOSIS — M47816 Spondylosis without myelopathy or radiculopathy, lumbar region: Secondary | ICD-10-CM | POA: Diagnosis not present

## 2022-08-30 DIAGNOSIS — M9903 Segmental and somatic dysfunction of lumbar region: Secondary | ICD-10-CM | POA: Diagnosis not present

## 2022-08-30 DIAGNOSIS — M47816 Spondylosis without myelopathy or radiculopathy, lumbar region: Secondary | ICD-10-CM | POA: Diagnosis not present

## 2022-09-03 DIAGNOSIS — M9903 Segmental and somatic dysfunction of lumbar region: Secondary | ICD-10-CM | POA: Diagnosis not present

## 2022-09-03 DIAGNOSIS — M9902 Segmental and somatic dysfunction of thoracic region: Secondary | ICD-10-CM | POA: Diagnosis not present

## 2022-09-03 DIAGNOSIS — M47812 Spondylosis without myelopathy or radiculopathy, cervical region: Secondary | ICD-10-CM | POA: Diagnosis not present

## 2022-09-03 DIAGNOSIS — M9901 Segmental and somatic dysfunction of cervical region: Secondary | ICD-10-CM | POA: Diagnosis not present

## 2022-09-03 DIAGNOSIS — S233XXA Sprain of ligaments of thoracic spine, initial encounter: Secondary | ICD-10-CM | POA: Diagnosis not present

## 2022-09-03 DIAGNOSIS — M47816 Spondylosis without myelopathy or radiculopathy, lumbar region: Secondary | ICD-10-CM | POA: Diagnosis not present

## 2022-09-25 DIAGNOSIS — J449 Chronic obstructive pulmonary disease, unspecified: Secondary | ICD-10-CM | POA: Diagnosis not present

## 2022-09-25 DIAGNOSIS — I10 Essential (primary) hypertension: Secondary | ICD-10-CM | POA: Diagnosis not present

## 2022-09-25 DIAGNOSIS — M549 Dorsalgia, unspecified: Secondary | ICD-10-CM | POA: Diagnosis not present

## 2022-09-25 DIAGNOSIS — M25512 Pain in left shoulder: Secondary | ICD-10-CM | POA: Diagnosis not present

## 2022-09-25 DIAGNOSIS — K219 Gastro-esophageal reflux disease without esophagitis: Secondary | ICD-10-CM | POA: Diagnosis not present

## 2022-10-10 DIAGNOSIS — H5203 Hypermetropia, bilateral: Secondary | ICD-10-CM | POA: Diagnosis not present

## 2022-10-15 DIAGNOSIS — R030 Elevated blood-pressure reading, without diagnosis of hypertension: Secondary | ICD-10-CM | POA: Diagnosis not present

## 2022-10-15 DIAGNOSIS — Z6835 Body mass index (BMI) 35.0-35.9, adult: Secondary | ICD-10-CM | POA: Diagnosis not present

## 2022-10-15 DIAGNOSIS — J440 Chronic obstructive pulmonary disease with acute lower respiratory infection: Secondary | ICD-10-CM | POA: Diagnosis not present

## 2022-10-15 DIAGNOSIS — Z20828 Contact with and (suspected) exposure to other viral communicable diseases: Secondary | ICD-10-CM | POA: Diagnosis not present

## 2022-10-18 ENCOUNTER — Emergency Department (HOSPITAL_COMMUNITY): Payer: Medicare HMO

## 2022-10-18 ENCOUNTER — Encounter (HOSPITAL_COMMUNITY): Payer: Self-pay

## 2022-10-18 ENCOUNTER — Other Ambulatory Visit: Payer: Self-pay

## 2022-10-18 ENCOUNTER — Inpatient Hospital Stay (HOSPITAL_COMMUNITY)
Admission: EM | Admit: 2022-10-18 | Discharge: 2022-10-20 | DRG: 192 | Disposition: A | Discharge status: Home / Self Care | Payer: Medicare HMO | Attending: Internal Medicine | Admitting: Internal Medicine

## 2022-10-18 DIAGNOSIS — R0600 Dyspnea, unspecified: Secondary | ICD-10-CM | POA: Diagnosis not present

## 2022-10-18 DIAGNOSIS — R0602 Shortness of breath: Secondary | ICD-10-CM | POA: Diagnosis present

## 2022-10-18 DIAGNOSIS — J441 Chronic obstructive pulmonary disease with (acute) exacerbation: Secondary | ICD-10-CM | POA: Diagnosis not present

## 2022-10-18 DIAGNOSIS — Z90710 Acquired absence of both cervix and uterus: Secondary | ICD-10-CM

## 2022-10-18 DIAGNOSIS — Z801 Family history of malignant neoplasm of trachea, bronchus and lung: Secondary | ICD-10-CM

## 2022-10-18 DIAGNOSIS — K219 Gastro-esophageal reflux disease without esophagitis: Secondary | ICD-10-CM | POA: Diagnosis not present

## 2022-10-18 DIAGNOSIS — F32A Depression, unspecified: Secondary | ICD-10-CM | POA: Diagnosis not present

## 2022-10-18 DIAGNOSIS — D72828 Other elevated white blood cell count: Secondary | ICD-10-CM | POA: Diagnosis present

## 2022-10-18 DIAGNOSIS — D72829 Elevated white blood cell count, unspecified: Secondary | ICD-10-CM | POA: Diagnosis not present

## 2022-10-18 DIAGNOSIS — E782 Mixed hyperlipidemia: Secondary | ICD-10-CM | POA: Diagnosis not present

## 2022-10-18 DIAGNOSIS — Z800 Family history of malignant neoplasm of digestive organs: Secondary | ICD-10-CM | POA: Diagnosis not present

## 2022-10-18 DIAGNOSIS — Z85118 Personal history of other malignant neoplasm of bronchus and lung: Secondary | ICD-10-CM

## 2022-10-18 DIAGNOSIS — E785 Hyperlipidemia, unspecified: Secondary | ICD-10-CM | POA: Diagnosis not present

## 2022-10-18 DIAGNOSIS — J40 Bronchitis, not specified as acute or chronic: Secondary | ICD-10-CM | POA: Diagnosis present

## 2022-10-18 DIAGNOSIS — Z79899 Other long term (current) drug therapy: Secondary | ICD-10-CM | POA: Diagnosis not present

## 2022-10-18 DIAGNOSIS — Z7982 Long term (current) use of aspirin: Secondary | ICD-10-CM

## 2022-10-18 DIAGNOSIS — T380X5A Adverse effect of glucocorticoids and synthetic analogues, initial encounter: Secondary | ICD-10-CM | POA: Diagnosis present

## 2022-10-18 DIAGNOSIS — J439 Emphysema, unspecified: Principal | ICD-10-CM | POA: Diagnosis present

## 2022-10-18 DIAGNOSIS — M810 Age-related osteoporosis without current pathological fracture: Secondary | ICD-10-CM | POA: Diagnosis present

## 2022-10-18 DIAGNOSIS — F419 Anxiety disorder, unspecified: Secondary | ICD-10-CM | POA: Diagnosis present

## 2022-10-18 DIAGNOSIS — Z923 Personal history of irradiation: Secondary | ICD-10-CM | POA: Diagnosis not present

## 2022-10-18 DIAGNOSIS — Z803 Family history of malignant neoplasm of breast: Secondary | ICD-10-CM

## 2022-10-18 DIAGNOSIS — Z8261 Family history of arthritis: Secondary | ICD-10-CM

## 2022-10-18 DIAGNOSIS — Z8051 Family history of malignant neoplasm of kidney: Secondary | ICD-10-CM

## 2022-10-18 DIAGNOSIS — I10 Essential (primary) hypertension: Secondary | ICD-10-CM | POA: Diagnosis not present

## 2022-10-18 DIAGNOSIS — Z87891 Personal history of nicotine dependence: Secondary | ICD-10-CM

## 2022-10-18 DIAGNOSIS — F418 Other specified anxiety disorders: Secondary | ICD-10-CM | POA: Diagnosis not present

## 2022-10-18 DIAGNOSIS — J449 Chronic obstructive pulmonary disease, unspecified: Secondary | ICD-10-CM | POA: Diagnosis present

## 2022-10-18 DIAGNOSIS — Z853 Personal history of malignant neoplasm of breast: Secondary | ICD-10-CM

## 2022-10-18 DIAGNOSIS — Z806 Family history of leukemia: Secondary | ICD-10-CM | POA: Diagnosis not present

## 2022-10-18 DIAGNOSIS — Z8249 Family history of ischemic heart disease and other diseases of the circulatory system: Secondary | ICD-10-CM | POA: Diagnosis not present

## 2022-10-18 LAB — COMPREHENSIVE METABOLIC PANEL
ALT: 31 U/L (ref 0–44)
AST: 35 U/L (ref 15–41)
Albumin: 3.9 g/dL (ref 3.5–5.0)
Alkaline Phosphatase: 101 U/L (ref 38–126)
Anion gap: 8 (ref 5–15)
BUN: 17 mg/dL (ref 8–23)
CO2: 27 mmol/L (ref 22–32)
Calcium: 9.3 mg/dL (ref 8.9–10.3)
Chloride: 103 mmol/L (ref 98–111)
Creatinine, Ser: 0.72 mg/dL (ref 0.44–1.00)
GFR, Estimated: >60 mL/min (ref >60)
Glucose, Bld: 159 mg/dL — ABNORMAL HIGH (ref 70–99)
Potassium: 4 mmol/L (ref 3.5–5.1)
Sodium: 138 mmol/L (ref 135–145)
Total Bilirubin: 0.7 mg/dL (ref 0.3–1.2)
Total Protein: 8 g/dL (ref 6.5–8.1)

## 2022-10-18 LAB — CBC WITH DIFFERENTIAL/PLATELET
Abs Immature Granulocytes: 0.07 10*3/uL (ref 0.00–0.07)
Basophils Absolute: 0 10*3/uL (ref 0.0–0.1)
Basophils Relative: 0 %
Eosinophils Absolute: 0 10*3/uL (ref 0.0–0.5)
Eosinophils Relative: 0 %
HCT: 38.6 % (ref 36.0–46.0)
Hemoglobin: 12 g/dL (ref 12.0–15.0)
Immature Granulocytes: 1 %
Lymphocytes Relative: 10 %
Lymphs Abs: 1.3 10*3/uL (ref 0.7–4.0)
MCH: 28.8 pg (ref 26.0–34.0)
MCHC: 31.1 g/dL (ref 30.0–36.0)
MCV: 92.6 fL (ref 80.0–100.0)
Monocytes Absolute: 0.5 10*3/uL (ref 0.1–1.0)
Monocytes Relative: 4 %
Neutro Abs: 12.1 10*3/uL — ABNORMAL HIGH (ref 1.7–7.7)
Neutrophils Relative %: 85 %
Platelets: 382 10*3/uL (ref 150–400)
RBC: 4.17 MIL/uL (ref 3.87–5.11)
RDW: 14.6 % (ref 11.5–15.5)
WBC: 14 10*3/uL — ABNORMAL HIGH (ref 4.0–10.5)
nRBC: 0 % (ref 0.0–0.2)

## 2022-10-18 LAB — BLOOD GAS, VENOUS
Acid-Base Excess: 5.8 mmol/L — ABNORMAL HIGH (ref 0.0–2.0)
Bicarbonate: 30.6 mmol/L — ABNORMAL HIGH (ref 20.0–28.0)
Drawn by: 41910
O2 Saturation: 96.1 %
Patient temperature: 36.5
pCO2, Ven: 43 mmHg — ABNORMAL LOW (ref 44–60)
pH, Ven: 7.46 — ABNORMAL HIGH (ref 7.25–7.43)
pO2, Ven: 74 mmHg — ABNORMAL HIGH (ref 32–45)

## 2022-10-18 LAB — TROPONIN I (HIGH SENSITIVITY)
Troponin I (High Sensitivity): 6 ng/L (ref <18)
Troponin I (High Sensitivity): 4 ng/L (ref <18)

## 2022-10-18 LAB — PROCALCITONIN: Procalcitonin: <0.1 ng/mL

## 2022-10-18 MED ORDER — CLONAZEPAM 0.5 MG PO TABS
0.5000 mg | ORAL_TABLET | Freq: Every day | ORAL | Status: DC
  Administered 2022-10-18 – 2022-10-19 (×2): 0.5 mg via ORAL
  Filled 2022-10-18 (×2): qty 1

## 2022-10-18 MED ORDER — IPRATROPIUM-ALBUTEROL 0.5-2.5 (3) MG/3ML IN SOLN
9.0000 mL | Freq: Once | RESPIRATORY_TRACT | Status: AC
  Administered 2022-10-18: 9 mL via RESPIRATORY_TRACT
  Filled 2022-10-18: qty 3

## 2022-10-18 MED ORDER — KETOROLAC TROMETHAMINE 30 MG/ML IJ SOLN
30.0000 mg | Freq: Once | INTRAMUSCULAR | Status: AC
  Administered 2022-10-18: 30 mg via INTRAVENOUS
  Filled 2022-10-18: qty 1

## 2022-10-18 MED ORDER — IPRATROPIUM-ALBUTEROL 0.5-2.5 (3) MG/3ML IN SOLN
RESPIRATORY_TRACT | Status: AC
  Administered 2022-10-18: 3 mL
  Filled 2022-10-18: qty 6

## 2022-10-18 MED ORDER — ONDANSETRON HCL 4 MG PO TABS: 4.0000 mg | ORAL_TABLET | Freq: Four times a day (QID) | ORAL | Status: DC | PRN

## 2022-10-18 MED ORDER — PANTOPRAZOLE SODIUM 40 MG PO TBEC
40.0000 mg | DELAYED_RELEASE_TABLET | Freq: Every day | ORAL | Status: DC
  Administered 2022-10-19 – 2022-10-20 (×2): 40 mg via ORAL
  Filled 2022-10-18 (×2): qty 1

## 2022-10-18 MED ORDER — ACETAMINOPHEN 650 MG RE SUPP: 650.0000 mg | Freq: Four times a day (QID) | RECTAL | Status: DC | PRN

## 2022-10-18 MED ORDER — HEPARIN SODIUM (PORCINE) 5000 UNIT/ML IJ SOLN
5000.0000 [IU] | Freq: Three times a day (TID) | INTRAMUSCULAR | Status: DC
  Administered 2022-10-18 – 2022-10-20 (×5): 5000 [IU] via SUBCUTANEOUS
  Filled 2022-10-18 (×5): qty 1

## 2022-10-18 MED ORDER — LISINOPRIL 10 MG PO TABS: 20.0000 mg | ORAL_TABLET | Freq: Every day | ORAL | Status: DC

## 2022-10-18 MED ORDER — ALBUTEROL SULFATE (2.5 MG/3ML) 0.083% IN NEBU: 2.5000 mg | INHALATION_SOLUTION | RESPIRATORY_TRACT | Status: DC | PRN

## 2022-10-18 MED ORDER — ALBUTEROL SULFATE (2.5 MG/3ML) 0.083% IN NEBU
2.5000 mg | INHALATION_SOLUTION | Freq: Once | RESPIRATORY_TRACT | Status: AC
Start: 2022-10-18 — End: 2022-10-18
  Administered 2022-10-18: 2.5 mg via RESPIRATORY_TRACT

## 2022-10-18 MED ORDER — ASPIRIN 81 MG PO TBEC
81.0000 mg | DELAYED_RELEASE_TABLET | Freq: Every day | ORAL | Status: DC
  Administered 2022-10-19 – 2022-10-20 (×2): 81 mg via ORAL
  Filled 2022-10-18 (×2): qty 1

## 2022-10-18 MED ORDER — IPRATROPIUM-ALBUTEROL 0.5-2.5 (3) MG/3ML IN SOLN
3.0000 mL | Freq: Once | RESPIRATORY_TRACT | Status: AC
  Administered 2022-10-18: 3 mL via RESPIRATORY_TRACT

## 2022-10-18 MED ORDER — IPRATROPIUM-ALBUTEROL 0.5-2.5 (3) MG/3ML IN SOLN
3.0000 mL | Freq: Once | RESPIRATORY_TRACT | Status: AC
Start: 2022-10-18 — End: 2022-10-18
  Administered 2022-10-18: 3 mL via RESPIRATORY_TRACT

## 2022-10-18 MED ORDER — DIPHENHYDRAMINE HCL 50 MG/ML IJ SOLN
25.0000 mg | Freq: Once | INTRAMUSCULAR | Status: AC
  Administered 2022-10-18: 25 mg via INTRAVENOUS
  Filled 2022-10-18: qty 1

## 2022-10-18 MED ORDER — ONDANSETRON HCL 4 MG/2ML IJ SOLN: 4.0000 mg | Freq: Four times a day (QID) | INTRAMUSCULAR | Status: DC | PRN

## 2022-10-18 MED ORDER — OXYCODONE HCL 5 MG PO TABS
5.0000 mg | ORAL_TABLET | ORAL | Status: DC | PRN
  Administered 2022-10-19: 5 mg via ORAL
  Filled 2022-10-18: qty 1

## 2022-10-18 MED ORDER — MORPHINE SULFATE (PF) 2 MG/ML IV SOLN: 2.0000 mg | INTRAVENOUS | Status: DC | PRN

## 2022-10-18 MED ORDER — MOMETASONE FURO-FORMOTEROL FUM 200-5 MCG/ACT IN AERO
2.0000 | INHALATION_SPRAY | Freq: Two times a day (BID) | RESPIRATORY_TRACT | Status: DC
  Administered 2022-10-19: 2 via RESPIRATORY_TRACT
  Filled 2022-10-18: qty 8.8

## 2022-10-18 MED ORDER — FUROSEMIDE 20 MG PO TABS: 20.0000 mg | ORAL_TABLET | ORAL | Status: DC | PRN

## 2022-10-18 MED ORDER — IPRATROPIUM-ALBUTEROL 0.5-2.5 (3) MG/3ML IN SOLN
3.0000 mL | Freq: Four times a day (QID) | RESPIRATORY_TRACT | Status: DC
  Administered 2022-10-19 (×4): 3 mL via RESPIRATORY_TRACT
  Filled 2022-10-18 (×4): qty 3

## 2022-10-18 MED ORDER — ALBUTEROL (5 MG/ML) CONTINUOUS INHALATION SOLN
10.0000 mg/h | INHALATION_SOLUTION | Freq: Once | RESPIRATORY_TRACT | Status: AC
  Administered 2022-10-18: 10 mg/h via RESPIRATORY_TRACT
  Filled 2022-10-18: qty 20

## 2022-10-18 MED ORDER — PREDNISONE 20 MG PO TABS
40.0000 mg | ORAL_TABLET | Freq: Every day | ORAL | Status: DC
  Administered 2022-10-20: 40 mg via ORAL
  Filled 2022-10-18: qty 2

## 2022-10-18 MED ORDER — METOPROLOL TARTRATE 50 MG PO TABS: 50.0000 mg | ORAL_TABLET | Freq: Two times a day (BID) | ORAL | Status: DC

## 2022-10-18 MED ORDER — ALBUTEROL SULFATE (2.5 MG/3ML) 0.083% IN NEBU
INHALATION_SOLUTION | RESPIRATORY_TRACT | Status: AC
  Administered 2022-10-18: 2.5 mg
  Filled 2022-10-18: qty 6

## 2022-10-18 MED ORDER — ACETAMINOPHEN 325 MG PO TABS
650.0000 mg | ORAL_TABLET | Freq: Four times a day (QID) | ORAL | Status: DC | PRN
  Administered 2022-10-19 – 2022-10-20 (×2): 650 mg via ORAL
  Filled 2022-10-18 (×2): qty 2

## 2022-10-18 MED ORDER — METHYLPREDNISOLONE SODIUM SUCC 125 MG IJ SOLR
125.0000 mg | Freq: Once | INTRAMUSCULAR | Status: AC
  Administered 2022-10-18: 125 mg via INTRAVENOUS
  Filled 2022-10-18: qty 2

## 2022-10-18 MED ORDER — GABAPENTIN 300 MG PO CAPS
300.0000 mg | ORAL_CAPSULE | Freq: Two times a day (BID) | ORAL | Status: DC
  Administered 2022-10-18 – 2022-10-20 (×4): 300 mg via ORAL
  Filled 2022-10-18 (×4): qty 1

## 2022-10-18 MED ORDER — UMECLIDINIUM BROMIDE 62.5 MCG/ACT IN AEPB
1.0000 | INHALATION_SPRAY | Freq: Every day | RESPIRATORY_TRACT | Status: DC
  Administered 2022-10-19 – 2022-10-20 (×2): 1 via RESPIRATORY_TRACT
  Filled 2022-10-18: qty 7

## 2022-10-18 MED ORDER — TIOTROPIUM BROMIDE MONOHYDRATE 18 MCG IN CAPS: 1.0000 | ORAL_CAPSULE | Freq: Every day | RESPIRATORY_TRACT | Status: DC

## 2022-10-18 MED ORDER — ROSUVASTATIN CALCIUM 10 MG PO TABS
10.0000 mg | ORAL_TABLET | Freq: Every day | ORAL | Status: DC
  Administered 2022-10-19 – 2022-10-20 (×2): 10 mg via ORAL
  Filled 2022-10-18 (×2): qty 1

## 2022-10-18 MED ORDER — FLUOXETINE HCL 20 MG PO CAPS
40.0000 mg | ORAL_CAPSULE | Freq: Every day | ORAL | Status: DC
  Administered 2022-10-19 – 2022-10-20 (×2): 40 mg via ORAL
  Filled 2022-10-18 (×2): qty 2

## 2022-10-18 MED ORDER — ALBUTEROL SULFATE (2.5 MG/3ML) 0.083% IN NEBU
2.5000 mg | INHALATION_SOLUTION | Freq: Once | RESPIRATORY_TRACT | Status: AC
  Administered 2022-10-18: 2.5 mg via RESPIRATORY_TRACT

## 2022-10-18 MED ORDER — PROCHLORPERAZINE EDISYLATE 10 MG/2ML IJ SOLN
10.0000 mg | Freq: Once | INTRAMUSCULAR | Status: AC
  Administered 2022-10-18: 10 mg via INTRAVENOUS
  Filled 2022-10-18: qty 2

## 2022-10-18 MED ORDER — METHYLPREDNISOLONE SODIUM SUCC 125 MG IJ SOLR
125.0000 mg | Freq: Two times a day (BID) | INTRAMUSCULAR | Status: AC
  Administered 2022-10-19 (×2): 125 mg via INTRAVENOUS
  Filled 2022-10-18 (×2): qty 2

## 2022-10-18 NOTE — Assessment & Plan Note (Signed)
-   Continue Prozac, continue Klonopin

## 2022-10-18 NOTE — Assessment & Plan Note (Signed)
Continue statin. 

## 2022-10-18 NOTE — ED Triage Notes (Signed)
Pt presents to ED from home with c/o sob, pt has hx of COPD, saw PCP Monday and dx with bronchitis, was given antibiotics and took them, says breathing got worse since Monday, pt says sob is worse with exertion.

## 2022-10-18 NOTE — Assessment & Plan Note (Signed)
-   Likely secondary to steroids - Procalcitonin pending - No evidence of pneumonia on chest x-ray

## 2022-10-18 NOTE — Assessment & Plan Note (Signed)
-   With significant wheezing on presentation - 3 albuterol treatments, 3 DuoNeb treatments in the ED - Patient has been on prednisone in the outpatient setting, and then was given Solu-Medrol in the ED - Patient continues to feel short of breath, remains tachypneic - Continue scheduled DuoNebs, as needed albuterol - Continue steroid - No antibiotic indicated at this time, procalcitonin pending - Continue to monitor

## 2022-10-18 NOTE — H&P (Signed)
History and Physical    Patient: Laura Abbott DOB: 17-Aug-1946 DOA: 10/18/2022 DOS: the patient was seen and examined on 10/18/2022 PCP: Forestdale Nation, MD  Patient coming from: Home  Chief Complaint:  Chief Complaint  Patient presents with   Shortness of Breath   HPI: Laura Abbott is a 76 y.o. female with medical history significant of anxiety, depression, COPD, lung cancer with partial left lung resection, hyperlipidemia, hypertension and more presents to the ED with a chief complaint of dyspnea.  The dyspnea started 5 days ago.  She went to an outpatient doc 3 days ago.  She was prescribed an antibiotic per her report, prednisone, and she already had rescue inhaler.  She reports that she has had no improvement.  She has been using her rescue inhaler 6 times per day.  It offers temporary improvement in her symptoms worse on the right back.  She denies fever, chest pain.  She admits to cough productive of yellow sputum.  She admits to palpitations that have been present in the ER when she was getting her albuterol treatments.  Her dyspnea is worse on exertion.  She has no orthopnea.  Patient takes as needed Lasix at home, nurse reported that she has not needed them recently.  Patient has no other complaints at this time.  Patient does not wear oxygen at baseline.  Patient does not smoke, does not drink alcohol, does not use illicit drugs.  She is vaccinated for COVID.  Patient is full code. Review of Systems: As mentioned in the history of present illness. All other systems reviewed and are negative. 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, right DCIS   COPD (chronic obstructive pulmonary disease) ([facility])    COPD exacerbation ([place]) 01/03/2014   With hypoxia   Depression    Emphysema of lung ([place])    Family history of breast cancer    Family history of colon cancer    Family history of kidney cancer     Family history of lung cancer    Family history of pancreatic cancer    GERD (gastroesophageal reflux disease)    History of kidney stones    Hyperlipidemia    Hypertension    Insomnia    Lung cancer ([place])    Obesity 01/05/2014   Osteoporosis    Osteoporosis, unspecified 12/01/2013   Personal history of radiation therapy    rt breast   Pneumonia    x2   Tobacco abuse 12/01/2013   Ulnar nerve impingement, left    Past Surgical History:  Procedure Laterality Date   ABDOMINAL HYSTERECTOMY     BLADDER SURGERY     had tacked   BREAST LUMPECTOMY Right    BREAST LUMPECTOMY WITH NEEDLE LOCALIZATION AND AXILLARY SENTINEL LYMPH NODE BX Right 11/09/2013   Procedure: BREAST LUMPECTOMY WITH NEEDLE LOCALIZATION AND AXILLARY SENTINEL LYMPH NODE BIOPSY;  Surgeon: Harl Bowie, MD;  Location: [facility];  Service: General;  Laterality: Right;   BREAST SURGERY Right    lumpectomy   CATARACT EXTRACTION W/PHACO Right 06/15/2022   Procedure: CATARACT EXTRACTION PHACO AND INTRAOCULAR LENS PLACEMENT ([place]);  Surgeon: Baruch Goldmann, MD;  Location: [facility];  Service: Ophthalmology;  Laterality: Right;  CDE 10.73   CATARACT EXTRACTION W/PHACO Left 06/29/2022   Procedure: CATARACT EXTRACTION PHACO AND INTRAOCULAR LENS PLACEMENT ([facility]);  Surgeon: Baruch Goldmann, MD;  Location: [facility];  Service: Ophthalmology;  Laterality: Left;  CDE: 11.37   CHOLECYSTECTOMY     NODE DISSECTION Left 12/15/2018   Procedure: NODE DISSECTION LEFT LUNG;  Surgeon: Melrose Nakayama, MD;  Location: [facility];  Service: Thoracic;  Laterality: Left;   PORTACATH PLACEMENT Right 01/19/2019   Procedure: INSERTION PORT-A-CATH;  Surgeon: Aviva Signs, MD;  Location: [facility];  Service: General;  Laterality: Right;   VIDEO ASSISTED THORACOSCOPY (VATS)/ LOBECTOMY Left 12/15/2018   Procedure: VIDEO ASSISTED THORACOSCOPY (VATS)/LEFT UPPER LOBECTOMY;  Surgeon: Melrose Nakayama, MD;  Location: [facility];  Service: Thoracic;  Laterality: Left;   Social  History:  reports that she quit smoking about 3 years ago. Her smoking use included cigarettes. She has a 21.50 pack-year smoking history. She has never used smokeless tobacco. She reports that she does not drink alcohol and does not use drugs.  No Known Allergies  Family History  Problem Relation Age of Onset   Lung cancer Mother 36       non smoker   Rheum arthritis Mother    Pancreatic cancer Father 69       d. 16   Leukemia Sister        dx in her 40s   Heart disease Sister    Colon cancer Sister        dx and died in her 60s   Heart disease Sister    Lung cancer Maternal Aunt        non-smoker   Heart disease Sister    Breast cancer Sister        dx early 23s   Heart disease Brother    Cirrhosis Brother        d. 32   Aneurysm Brother        d. 5s   Fibromyalgia Daughter    Kidney cancer Nephew 71   Kidney cancer Nephew        dx in 74s; great nephew   Leukemia Nephew    Liver disease Nephew    Other Niece        white matter on brain; dx late 39s   Leukemia Nephew     Prior to Admission medications   Medication Sig Start Date End Date Taking? Authorizing Provider  albuterol (PROVENTIL HFA;VENTOLIN HFA) 108 (90 BASE) MCG/ACT inhaler Inhale 2 puffs into the lungs 3 (three) times daily. Also use the inhaler every 2 hours if needed for worsening shortness of breath and wheezing. 01/05/14   Rexene Alberts, MD  aspirin EC 81 MG tablet Take 1 tablet (81 mg total) by mouth daily. Swallow whole. 12/28/21   Strader, Fransisco Hertz, PA-C  Calcium 600-200 MG-UNIT per tablet Take 1 tablet by mouth daily.    [provider]  clonazePAM (KLONOPIN) 0.5 MG tablet Take 0.5 mg by mouth at bedtime. 09/30/18   [provider]  FLUoxetine (PROZAC) 40 MG capsule Take 40 mg by mouth daily.    [provider]  fluticasone (FLONASE) 50 MCG/ACT nasal spray Place 2 sprays into both nostrils daily.    [provider]  furosemide (LASIX) 20 MG tablet Take 20 mg by  mouth as needed. 09/06/20   [provider]  gabapentin (NEURONTIN) 300 MG capsule Take 300 mg by mouth 2 (two) times daily. 09/11/21   [provider]  lisinopril (ZESTRIL) 20 MG tablet Take 1 tablet (20 mg total) by mouth daily. 01/19/21 06/15/22  Arnoldo Lenis, MD  metoprolol (LOPRESSOR) 50 MG  tablet Take 50-75 mg by mouth 2 (two) times daily. Takes 75 mg in the morning and 50 mg in the evening    [provider]  nitroGLYCERIN (NITROSTAT) 0.4 MG SL tablet Place 1 tablet (0.4 mg total) under the tongue every 5 (five) minutes as needed for chest pain. Patient not taking: Reported on 05/02/2022 12/28/21 03/28/22  Ahmed Prima Fransisco Hertz, PA-C  omeprazole (PRILOSEC) 40 MG capsule Take 40 mg by mouth daily.  09/03/13   [provider]  rosuvastatin (CRESTOR) 10 MG tablet Take 10 mg by mouth daily. 09/12/21   [provider]  SPIRIVA HANDIHALER 18 MCG inhalation capsule Place 1 capsule into inhaler and inhale daily at 6 (six) AM. 09/27/20   [provider]  tiZANidine (ZANAFLEX) 4 MG tablet Take 2 mg by mouth 3 (three) times daily as needed. 02/13/21   [provider]    Physical Exam: Vitals:   10/18/22 1948 10/18/22 1953 10/18/22 2100 10/18/22 2113  BP:    (!) 136/54  Pulse: (!) 125 (!) 124  (!) 119  Resp: (!) 23 20  19   Temp:    98 F (36.7 C)  TempSrc:      SpO2: 92% 93%  94%  Weight:   91.7 kg   Height:   5\' 3"  (1.6 m)    1.  General: Patient lying supine in bed,  no acute distress   2. Psychiatric: Alert and oriented x 3, mood and behavior normal for situation, pleasant and cooperative with exam   3. Neurologic: Speech and language are normal, face is symmetric, moves all 4 extremities voluntarily, at baseline without acute deficits on limited exam   4. HEENMT:  Head is atraumatic, normocephalic, pupils reactive to light, neck is supple, trachea is midline, mucous membranes are moist   5. Respiratory : Mild wheezes  present, otherwise lungs are clear without rhonchi, rales, no cyanosis, no increase in work of breathing or accessory muscle use   6. Cardiovascular : Heart rate tachycardic, rhythm is regular, no murmurs, rubs or gallops, no peripheral edema, peripheral pulses palpated   7. Gastrointestinal:  Abdomen is soft, nondistended, nontender to palpation bowel sounds active, no masses or organomegaly palpated   8. Skin:  Skin is warm, dry and intact without rashes, acute lesions, or ulcers on limited exam   9.Musculoskeletal:  No acute deformities or trauma, no asymmetry in tone, no peripheral edema, peripheral pulses palpated, no tenderness to palpation in the extremities  Data Reviewed: In the ED Temp 97.7, heart rate 79-128, respiratory rate 18-31, blood pressure 125/50-149/68, satting 93-99% on room air pH 7.46, PCO2 43 Leukocytosis of 14, hemoglobin 12.0, platelets 382 Chemistry is unremarkable Troponin downtrending 6, 4 Chest x-ray shows no active disease Albuterol x3, DuoNeb x3, Solu-Medrol, Compazine, Benadryl given in the ED Compazine and Benadryl given because patient reported a headache after all of the breathing treatments  Assessment and Plan: * COPD (chronic obstructive pulmonary disease) ([place]) - With significant wheezing on presentation - 3 albuterol treatments, 3 DuoNeb treatments in the ED - Patient has been on prednisone in the outpatient setting, and then was given Solu-Medrol in the ED - Patient continues to feel short of breath, remains tachypneic - Continue scheduled DuoNebs, as needed albuterol - Continue steroid - No antibiotic indicated at this time, procalcitonin pending - Continue to monitor  Depression with anxiety - Continue Prozac, continue Klonopin  Hyperlipidemia - Continue statin  Leukocytosis - Likely secondary to steroids - Procalcitonin pending -  No evidence of pneumonia on chest x-ray  Hypertension - Continue lisinopril,  metoprolol      Advance Care Planning:   Code Status: Full Code   Consults: none  Family Communication: Daughter at bedside  Severity of Illness: The appropriate patient status for this patient is OBSERVATION. Observation status is judged to be reasonable and necessary in order to provide the required intensity of service to ensure the patient's safety. The patient's presenting symptoms, physical exam findings, and initial radiographic and laboratory data in the context of their medical condition is felt to place them at decreased risk for further clinical deterioration. Furthermore, it is anticipated that the patient will be medically stable for discharge from the hospital within 2 midnights of admission.   Author: Rolla Plate, DO 10/18/2022 10:46 PM  For on call review [email or web address].

## 2022-10-18 NOTE — ED Provider Notes (Signed)
[facility] EMERGENCY DEPARTMENT Provider Note  CSN: [id number] Arrival date & time: 10/18/22 1536  Chief Complaint(s) Shortness of Breath  HPI Laura Abbott is a 76 y.o. female with PMH COPD, HTN, HLD who presents emergency department for evaluation of shortness of breath.  Patient saw her primary care physician 4 days ago who diagnosed the patient with bronchitis and gave the patient antibiotics (?)  But unfortunately patient did not improve and over the last 4 days has had progressively worsening shortness of breath and wheezing.  Shortness of breath worse with exertion but patient denies chest pain, abdominal pain, nausea, vomiting or other systemic symptoms.   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, right DCIS   COPD (chronic obstructive pulmonary disease) ([place])    COPD exacerbation ([place]) 01/03/2014   With hypoxia   Depression    Emphysema of lung ([place])    Family history of breast cancer    Family history of colon cancer    Family history of kidney cancer    Family history of lung cancer    Family history of pancreatic cancer    GERD (gastroesophageal reflux disease)    History of kidney stones    Hyperlipidemia    Hypertension    Insomnia    Lung cancer ([place])    Obesity 01/05/2014   Osteoporosis    Osteoporosis, unspecified 12/01/2013   Personal history of radiation therapy    rt breast   Pneumonia    x2   Tobacco abuse 12/01/2013   Ulnar nerve impingement, left    Patient Active Problem List   Diagnosis Date Noted   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   Genetic testing 12/24/2018   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   Lung cancer ([place]) 12/15/2018   Family history of breast cancer    Family history of pancreatic cancer    Family history of lung cancer    Family history of kidney cancer    Family history of colon cancer    Lung mass 11/24/2018    Hypokalemia 11/25/2016   Preventive measure 10/16/2014   Obesity 01/05/2014   Bronchitis 01/03/2014   COPD exacerbation ([place]) 01/03/2014   Acute respiratory failure with hypoxia ([place]) 01/03/2014   Hyperglycemia 01/03/2014   Hypertension 01/03/2014   Osteoporosis 12/01/2013   Tobacco abuse 12/01/2013   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 Authorizing Provider  albuterol (PROVENTIL HFA;VENTOLIN HFA) 108 (90 BASE) MCG/ACT inhaler Inhale 2 puffs into the lungs 3 (three) times daily. Also use the inhaler every 2 hours if needed for worsening shortness of breath and wheezing. 01/05/14   Rexene Alberts, MD  aspirin EC 81 MG tablet Take 1 tablet (81 mg total) by mouth daily. Swallow whole. 12/28/21   Strader, Fransisco Hertz, PA-C  Calcium 600-200 MG-UNIT per tablet Take 1 tablet by mouth daily.    [provider]  clonazePAM (KLONOPIN) 0.5 MG tablet Take 0.5 mg by mouth at bedtime. 09/30/18   [provider]  FLUoxetine (PROZAC) 40 MG capsule Take 40 mg by mouth daily.  [provider]  fluticasone (FLONASE) 50 MCG/ACT nasal spray Place 2 sprays into both nostrils daily.    [provider]  furosemide (LASIX) 20 MG tablet Take 20 mg by mouth as needed. 09/06/20   [provider]  gabapentin (NEURONTIN) 300 MG capsule Take 300 mg by mouth 2 (two) times daily. 09/11/21   [provider]  lisinopril (ZESTRIL) 20 MG tablet Take 1 tablet (20 mg total) by mouth daily. 01/19/21 06/15/22  Arnoldo Lenis, MD  metoprolol (LOPRESSOR) 50 MG tablet Take 50-75 mg by mouth 2 (two) times daily. Takes 75 mg in the morning and 50 mg in the evening    [provider]  nitroGLYCERIN  (NITROSTAT) 0.4 MG SL tablet Place 1 tablet (0.4 mg total) under the tongue every 5 (five) minutes as needed for chest pain. Patient not taking: Reported on 05/02/2022 12/28/21 03/28/22  Ahmed Prima Fransisco Hertz, PA-C  omeprazole (PRILOSEC) 40 MG capsule Take 40 mg by mouth daily.  09/03/13   [provider]  rosuvastatin (CRESTOR) 10 MG tablet Take 10 mg by mouth daily. 09/12/21   [provider]  SPIRIVA HANDIHALER 18 MCG inhalation capsule Place 1 capsule into inhaler and inhale daily at 6 (six) AM. 09/27/20   [provider]  tiZANidine (ZANAFLEX) 4 MG tablet Take 2 mg by mouth 3 (three) times daily as needed. 02/13/21   [provider]                                                                                                                                    Past Surgical History Past Surgical History:  Procedure Laterality Date   ABDOMINAL HYSTERECTOMY     BLADDER SURGERY     had tacked   BREAST LUMPECTOMY Right    BREAST LUMPECTOMY WITH NEEDLE LOCALIZATION AND AXILLARY SENTINEL LYMPH NODE BX Right 11/09/2013   Procedure: BREAST LUMPECTOMY WITH NEEDLE LOCALIZATION AND AXILLARY SENTINEL LYMPH NODE BIOPSY;  Surgeon: Harl Bowie, MD;  Location: [place];  Service: General;  Laterality: Right;   BREAST SURGERY Right    lumpectomy   CATARACT EXTRACTION W/PHACO Right 06/15/2022   Procedure: CATARACT EXTRACTION PHACO AND INTRAOCULAR LENS PLACEMENT ([place]);  Surgeon: Baruch Goldmann, MD;  Location: [facility];  Service: Ophthalmology;  Laterality: Right;  CDE 10.73   CATARACT EXTRACTION W/PHACO Left 06/29/2022   Procedure: CATARACT EXTRACTION PHACO AND INTRAOCULAR LENS PLACEMENT ([facility]);  Surgeon: Baruch Goldmann, MD;  Location: [facility];  Service: Ophthalmology;  Laterality: Left;  CDE: 11.37   CHOLECYSTECTOMY     NODE DISSECTION Left 12/15/2018   Procedure: NODE DISSECTION LEFT LUNG;  Surgeon: Melrose Nakayama, MD;  Location: [place];  Service: Thoracic;  Laterality: Left;    PORTACATH PLACEMENT Right 01/19/2019   Procedure: INSERTION PORT-A-CATH;  Surgeon: Aviva Signs, MD;  Location: [facility];  Service: General;  Laterality: Right;   VIDEO ASSISTED THORACOSCOPY (VATS)/ LOBECTOMY Left  12/15/2018   Procedure: VIDEO ASSISTED THORACOSCOPY (VATS)/LEFT UPPER LOBECTOMY;  Surgeon: Melrose Nakayama, MD;  Location[facility];  Service: Thoracic;  Laterality: Left;   Family History Family History  Problem Relation Age of Onset   Lung cancer Mother 43       non smoker   Rheum arthritis Mother    Pancreatic cancer Father 67       d. 48   Leukemia Sister        dx in her 28s   Heart disease Sister    Colon cancer Sister        dx and died in her 62s   Heart disease Sister    Lung cancer Maternal Aunt        non-smoker   Heart disease Sister    Breast cancer Sister        dx early 65s   Heart disease Brother    Cirrhosis Brother        d. 70   Aneurysm Brother        d. 13s   Fibromyalgia Daughter    Kidney cancer Nephew 71   Kidney cancer Nephew        dx in 44s; great nephew   Leukemia Nephew    Liver disease Nephew    Other Niece        white matter on brain; dx late 19s   Leukemia Nephew     Social History Social History   Tobacco Use   Smoking status: Former    Packs/day: 0.50    Years: 43.00    Total pack years: 21.50    Types: Cigarettes    Quit date: 10/24/2018    Years since quitting: 3.9   Smokeless tobacco: Never  Vaping Use   Vaping Use: Never used  Substance Use Topics   Alcohol use: Never   Drug use: No   Allergies Patient has no known allergies.  Review of Systems Review of Systems  Respiratory:  Positive for cough, chest tightness, shortness of breath and wheezing.     Physical Exam Vital Signs  I have reviewed the triage vital signs BP (!) 143/63   Pulse (!) 107   Temp 97.7 F (36.5 C)   Resp 20   Ht 5\' 3"  (1.6 m)   Wt 89.8 kg   SpO2 98%   BMI 35.07 kg/m   Physical Exam Vitals and nursing note reviewed.   Constitutional:      General: She is not in acute distress.    Appearance: She is well-developed.  HENT:     Head: Normocephalic and atraumatic.  Eyes:     Conjunctiva/sclera: Conjunctivae normal.  Cardiovascular:     Rate and Rhythm: Normal rate and regular rhythm.     Heart sounds: No murmur heard. Pulmonary:     Effort: Pulmonary effort is normal. Tachypnea present. No respiratory distress.     Breath sounds: Wheezing present.  Abdominal:     Palpations: Abdomen is soft.     Tenderness: There is no abdominal tenderness.  Musculoskeletal:        General: No swelling.     Cervical back: Neck supple.  Skin:    General: Skin is warm and dry.     Capillary Refill: Capillary refill takes less than 2 seconds.  Neurological:     Mental Status: She is alert.  Psychiatric:        Mood and Affect: Mood normal.     ED Results  and Treatments Labs (all labs ordered are listed, but only abnormal results are displayed) Labs Reviewed  COMPREHENSIVE METABOLIC PANEL - Abnormal; Notable for the following components:      Result Value   Glucose, Bld 159 (*)    All other components within normal limits  BLOOD GAS, VENOUS - Abnormal; Notable for the following components:   pH, Ven 7.46 (*)    pCO2, Ven 43 (*)    pO2, Ven 74 (*)    Bicarbonate 30.6 (*)    Acid-Base Excess 5.8 (*)    All other components within normal limits  CBC WITH DIFFERENTIAL/PLATELET - Abnormal; Notable for the following components:   WBC 14.0 (*)    Neutro Abs 12.1 (*)    All other components within normal limits  CBC WITH DIFFERENTIAL/PLATELET  TROPONIN I (HIGH SENSITIVITY)  TROPONIN I (HIGH SENSITIVITY)                                                                                                                          Radiology No results found.  Pertinent labs & imaging results that were available during my care of the patient were reviewed by me and considered in my medical decision making (see MDM  for details).  Medications Ordered in ED Medications  diphenhydrAMINE (BENADRYL) injection 25 mg (has no administration in time range)  prochlorperazine (COMPAZINE) injection 10 mg (has no administration in time range)  ipratropium-albuterol (DUONEB) 0.5-2.5 (3) MG/3ML nebulizer solution 9 mL (9 mLs Nebulization Given 10/18/22 1612)  methylPREDNISolone sodium succinate (SOLU-MEDROL) 125 mg/2 mL injection 125 mg (125 mg Intravenous Given 10/18/22 1601)  albuterol (PROVENTIL) (2.5 MG/3ML) 0.083% nebulizer solution 2.5 mg (2.5 mg Nebulization Given 10/18/22 1621)  ipratropium-albuterol (DUONEB) 0.5-2.5 (3) MG/3ML nebulizer solution 3 mL (3 mLs Nebulization Given 10/18/22 1622)  albuterol (PROVENTIL) (2.5 MG/3ML) 0.083% nebulizer solution 2.5 mg (2.5 mg Nebulization Given 10/18/22 1622)  ipratropium-albuterol (DUONEB) 0.5-2.5 (3) MG/3ML nebulizer solution 3 mL (3 mLs Nebulization Given 10/18/22 1622)  ipratropium-albuterol (DUONEB) 0.5-2.5 (3) MG/3ML nebulizer solution (3 mLs  Given 10/18/22 1616)  albuterol (PROVENTIL) (2.5 MG/3ML) 0.083% nebulizer solution (2.5 mg  Given 10/18/22 1616)  albuterol (PROVENTIL,VENTOLIN) solution continuous neb (10 mg/hr Nebulization Given 10/18/22 1743)  Procedures .Critical Care  Performed by: Teressa Lower, MD Authorized by: Teressa Lower, MD   Critical care provider statement:    Critical care time (minutes):  30   Critical care was necessary to treat or prevent imminent or life-threatening deterioration of the following conditions:  Respiratory failure   Critical care was time spent personally by me on the following activities:  Development of treatment plan with patient or surrogate, discussions with consultants, evaluation of patient's response to treatment, examination of patient, ordering and review of laboratory studies,  ordering and review of radiographic studies, ordering and performing treatments and interventions, pulse oximetry, re-evaluation of patient's condition and review of old charts   (including critical care time)  Medical Decision Making / ED Course   This patient presents to the ED for concern of shortness of breath, wheezing, this involves an extensive number of treatment options, and is a complaint that carries with it a high risk of complications and morbidity.  The differential diagnosis includes COPD exacerbation, pneumonia, ACS, electrolyte abnormality, anemia  MDM: Patient seen in the emergency department for evaluation of shortness of breath and wheezing.  Physical exam with significant expiratory wheezing bilaterally and tachypnea.  Laboratory evaluation with a leukocytosis to 14, pH 7.46 with no significant hypercarbia, high-sensitivity troponin is normal.  Chest x-ray unremarkable.  Patient received 3 back-to-back DuoNebs and methylprednisolone and on reevaluation wheezing slightly improved but persistent.  Patient then received an hour-long continuous albuterol treatment and wheezing again continues to improve but remains persistent.  Patient does feel symptomatically improved and will require hospital admission for q4 albuterol therapy and resolution of wheezing.  Patient then admitted.   Additional history obtained: -Additional history obtained from daughter -External records from outside source obtained and reviewed including: Chart review including previous notes, labs, imaging, consultation notes   Lab Tests: -I ordered, reviewed, and interpreted labs.   The pertinent results include:   Labs Reviewed  COMPREHENSIVE METABOLIC PANEL - Abnormal; Notable for the following components:      Result Value   Glucose, Bld 159 (*)    All other components within normal limits  BLOOD GAS, VENOUS - Abnormal; Notable for the following components:   pH, Ven 7.46 (*)    pCO2, Ven 43 (*)     pO2, Ven 74 (*)    Bicarbonate 30.6 (*)    Acid-Base Excess 5.8 (*)    All other components within normal limits  CBC WITH DIFFERENTIAL/PLATELET - Abnormal; Notable for the following components:   WBC 14.0 (*)    Neutro Abs 12.1 (*)    All other components within normal limits  CBC WITH DIFFERENTIAL/PLATELET  TROPONIN I (HIGH SENSITIVITY)  TROPONIN I (HIGH SENSITIVITY)      EKG   EKG Interpretation  Date/Time:  Thursday October 18 2022 15:50:25 EST Ventricular Rate:  82 PR Interval:  123 QRS Duration: 91 QT Interval:  402 QTC Calculation: 470 R Axis:   -46 Text Interpretation: Sinus rhythm Left anterior fascicular block Confirmed by [place] ([id number]) on 10/18/2022 4:56:08 PM         Imaging Studies ordered: I ordered imaging studies including chest x-ray I independently visualized and interpreted imaging. I agree with the radiologist interpretation   Medicines ordered and prescription drug management: Meds ordered this encounter  Medications   ipratropium-albuterol (DUONEB) 0.5-2.5 (3) MG/3ML nebulizer solution 9 mL   methylPREDNISolone sodium succinate (SOLU-MEDROL) 125 mg/2 mL injection 125 mg   albuterol (PROVENTIL) (2.5 MG/3ML) 0.083% nebulizer solution 2.5  mg   ipratropium-albuterol (DUONEB) 0.5-2.5 (3) MG/3ML nebulizer solution 3 mL   albuterol (PROVENTIL) (2.5 MG/3ML) 0.083% nebulizer solution 2.5 mg   ipratropium-albuterol (DUONEB) 0.5-2.5 (3) MG/3ML nebulizer solution 3 mL   ipratropium-albuterol (DUONEB) 0.5-2.5 (3) MG/3ML nebulizer solution    Hall, Christy L: cabinet override   albuterol (PROVENTIL) (2.5 MG/3ML) 0.083% nebulizer solution    Hall, Christy L: cabinet override   albuterol (PROVENTIL,VENTOLIN) solution continuous neb   diphenhydrAMINE (BENADRYL) injection 25 mg   prochlorperazine (COMPAZINE) injection 10 mg    -I have reviewed the patients home medicines and have made adjustments as needed  Critical interventions Multiple  DuoNebs, steroids   Cardiac Monitoring: The patient was maintained on a cardiac monitor.  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, right DCIS   COPD (chronic obstructive pulmonary disease) ([place])    COPD exacerbation ([place]) 01/03/2014   With hypoxia   Depression    Emphysema of lung ([place])    Family history of breast cancer    Family history of colon cancer    Family history of kidney cancer    Family history of lung cancer    Family history of pancreatic cancer    GERD (gastroesophageal reflux disease)    History of kidney stones    Hyperlipidemia    Hypertension    Insomnia    Lung cancer ([place])    Obesity 01/05/2014   Osteoporosis    Osteoporosis, unspecified 12/01/2013   Personal history of radiation therapy    rt breast   Pneumonia    x2   Tobacco abuse 12/01/2013   Ulnar nerve impingement, left       Dispostion: I considered admission for this patient, and due to persistent wheezing in the setting of a COPD exacerbation, patient require hospital admission     Final Clinical Impression(s) / ED Diagnoses Final diagnoses:  None     @PCDICTATION @    Teressa Lower, MD 10/18/22 1921

## 2022-10-18 NOTE — Assessment & Plan Note (Signed)
-   Continue lisinopril, metoprolol

## 2022-10-19 DIAGNOSIS — F418 Other specified anxiety disorders: Secondary | ICD-10-CM | POA: Diagnosis not present

## 2022-10-19 DIAGNOSIS — F32A Depression, unspecified: Secondary | ICD-10-CM | POA: Diagnosis present

## 2022-10-19 DIAGNOSIS — Z8261 Family history of arthritis: Secondary | ICD-10-CM | POA: Diagnosis not present

## 2022-10-19 DIAGNOSIS — T380X5A Adverse effect of glucocorticoids and synthetic analogues, initial encounter: Secondary | ICD-10-CM | POA: Diagnosis present

## 2022-10-19 DIAGNOSIS — Z853 Personal history of malignant neoplasm of breast: Secondary | ICD-10-CM | POA: Diagnosis not present

## 2022-10-19 DIAGNOSIS — F419 Anxiety disorder, unspecified: Secondary | ICD-10-CM | POA: Diagnosis present

## 2022-10-19 DIAGNOSIS — Z8051 Family history of malignant neoplasm of kidney: Secondary | ICD-10-CM | POA: Diagnosis not present

## 2022-10-19 DIAGNOSIS — I10 Essential (primary) hypertension: Secondary | ICD-10-CM | POA: Diagnosis present

## 2022-10-19 DIAGNOSIS — Z800 Family history of malignant neoplasm of digestive organs: Secondary | ICD-10-CM | POA: Diagnosis not present

## 2022-10-19 DIAGNOSIS — D72828 Other elevated white blood cell count: Secondary | ICD-10-CM | POA: Diagnosis present

## 2022-10-19 DIAGNOSIS — E782 Mixed hyperlipidemia: Secondary | ICD-10-CM | POA: Diagnosis not present

## 2022-10-19 DIAGNOSIS — Z87891 Personal history of nicotine dependence: Secondary | ICD-10-CM | POA: Diagnosis not present

## 2022-10-19 DIAGNOSIS — R0602 Shortness of breath: Secondary | ICD-10-CM | POA: Diagnosis present

## 2022-10-19 DIAGNOSIS — Z79899 Other long term (current) drug therapy: Secondary | ICD-10-CM | POA: Diagnosis not present

## 2022-10-19 DIAGNOSIS — Z923 Personal history of irradiation: Secondary | ICD-10-CM | POA: Diagnosis not present

## 2022-10-19 DIAGNOSIS — Z803 Family history of malignant neoplasm of breast: Secondary | ICD-10-CM | POA: Diagnosis not present

## 2022-10-19 DIAGNOSIS — Z801 Family history of malignant neoplasm of trachea, bronchus and lung: Secondary | ICD-10-CM | POA: Diagnosis not present

## 2022-10-19 DIAGNOSIS — E785 Hyperlipidemia, unspecified: Secondary | ICD-10-CM | POA: Diagnosis present

## 2022-10-19 DIAGNOSIS — Z8249 Family history of ischemic heart disease and other diseases of the circulatory system: Secondary | ICD-10-CM | POA: Diagnosis not present

## 2022-10-19 DIAGNOSIS — Z7982 Long term (current) use of aspirin: Secondary | ICD-10-CM | POA: Diagnosis not present

## 2022-10-19 DIAGNOSIS — Z85118 Personal history of other malignant neoplasm of bronchus and lung: Secondary | ICD-10-CM | POA: Diagnosis not present

## 2022-10-19 DIAGNOSIS — Z90710 Acquired absence of both cervix and uterus: Secondary | ICD-10-CM | POA: Diagnosis not present

## 2022-10-19 DIAGNOSIS — J439 Emphysema, unspecified: Secondary | ICD-10-CM | POA: Diagnosis present

## 2022-10-19 DIAGNOSIS — K219 Gastro-esophageal reflux disease without esophagitis: Secondary | ICD-10-CM | POA: Diagnosis present

## 2022-10-19 DIAGNOSIS — Z806 Family history of leukemia: Secondary | ICD-10-CM | POA: Diagnosis not present

## 2022-10-19 DIAGNOSIS — J40 Bronchitis, not specified as acute or chronic: Secondary | ICD-10-CM | POA: Diagnosis present

## 2022-10-19 DIAGNOSIS — J441 Chronic obstructive pulmonary disease with (acute) exacerbation: Secondary | ICD-10-CM | POA: Diagnosis not present

## 2022-10-19 DIAGNOSIS — M810 Age-related osteoporosis without current pathological fracture: Secondary | ICD-10-CM | POA: Diagnosis present

## 2022-10-19 LAB — CBC WITH DIFFERENTIAL/PLATELET
Abs Immature Granulocytes: 0.15 10*3/uL — ABNORMAL HIGH (ref 0.00–0.07)
Basophils Absolute: 0 10*3/uL (ref 0.0–0.1)
Basophils Relative: 0 %
Eosinophils Absolute: 0 10*3/uL (ref 0.0–0.5)
Eosinophils Relative: 0 %
HCT: 36.6 % (ref 36.0–46.0)
Hemoglobin: 11.4 g/dL — ABNORMAL LOW (ref 12.0–15.0)
Immature Granulocytes: 1 %
Lymphocytes Relative: 6 %
Lymphs Abs: 0.9 10*3/uL (ref 0.7–4.0)
MCH: 28.9 pg (ref 26.0–34.0)
MCHC: 31.1 g/dL (ref 30.0–36.0)
MCV: 92.9 fL (ref 80.0–100.0)
Monocytes Absolute: 0.6 10*3/uL (ref 0.1–1.0)
Monocytes Relative: 4 %
Neutro Abs: 14.4 10*3/uL — ABNORMAL HIGH (ref 1.7–7.7)
Neutrophils Relative %: 89 %
Platelets: 364 10*3/uL (ref 150–400)
RBC: 3.94 MIL/uL (ref 3.87–5.11)
RDW: 14.7 % (ref 11.5–15.5)
WBC: 16.1 10*3/uL — ABNORMAL HIGH (ref 4.0–10.5)
nRBC: 0 % (ref 0.0–0.2)

## 2022-10-19 LAB — COMPREHENSIVE METABOLIC PANEL
ALT: 25 U/L (ref 0–44)
AST: 28 U/L (ref 15–41)
Albumin: 3.3 g/dL — ABNORMAL LOW (ref 3.5–5.0)
Alkaline Phosphatase: 77 U/L (ref 38–126)
Anion gap: 12 (ref 5–15)
BUN: 16 mg/dL (ref 8–23)
CO2: 26 mmol/L (ref 22–32)
Calcium: 9.5 mg/dL (ref 8.9–10.3)
Chloride: 100 mmol/L (ref 98–111)
Creatinine, Ser: 0.65 mg/dL (ref 0.44–1.00)
GFR, Estimated: >60 mL/min (ref >60)
Glucose, Bld: 149 mg/dL — ABNORMAL HIGH (ref 70–99)
Potassium: 4.4 mmol/L (ref 3.5–5.1)
Sodium: 138 mmol/L (ref 135–145)
Total Bilirubin: 0.5 mg/dL (ref 0.3–1.2)
Total Protein: 6.6 g/dL (ref 6.5–8.1)

## 2022-10-19 LAB — MAGNESIUM: Magnesium: 1.8 mg/dL (ref 1.7–2.4)

## 2022-10-19 MED ORDER — METOPROLOL SUCCINATE ER 50 MG PO TB24
50.0000 mg | ORAL_TABLET | Freq: Every day | ORAL | Status: DC
  Administered 2022-10-19 – 2022-10-20 (×2): 50 mg via ORAL
  Filled 2022-10-19 (×2): qty 1

## 2022-10-19 MED ORDER — ARFORMOTEROL TARTRATE 15 MCG/2ML IN NEBU
15.0000 ug | INHALATION_SOLUTION | Freq: Two times a day (BID) | RESPIRATORY_TRACT | Status: DC
  Administered 2022-10-19 – 2022-10-20 (×2): 15 ug via RESPIRATORY_TRACT
  Filled 2022-10-19 (×2): qty 2

## 2022-10-19 MED ORDER — GUAIFENESIN ER 600 MG PO TB12
600.0000 mg | ORAL_TABLET | Freq: Two times a day (BID) | ORAL | Status: DC
  Administered 2022-10-19: 600 mg via ORAL
  Filled 2022-10-19: qty 1

## 2022-10-19 MED ORDER — IPRATROPIUM-ALBUTEROL 0.5-2.5 (3) MG/3ML IN SOLN
3.0000 mL | Freq: Three times a day (TID) | RESPIRATORY_TRACT | Status: DC
  Administered 2022-10-20: 3 mL via RESPIRATORY_TRACT
  Filled 2022-10-19 (×2): qty 3

## 2022-10-19 MED ORDER — DM-GUAIFENESIN ER 30-600 MG PO TB12
1.0000 | ORAL_TABLET | Freq: Two times a day (BID) | ORAL | Status: DC
  Administered 2022-10-19 – 2022-10-20 (×2): 1 via ORAL
  Filled 2022-10-19 (×3): qty 1

## 2022-10-19 MED ORDER — BUDESONIDE 0.25 MG/2ML IN SUSP
0.2500 mg | Freq: Two times a day (BID) | RESPIRATORY_TRACT | Status: DC
  Administered 2022-10-19 – 2022-10-20 (×2): 0.25 mg via RESPIRATORY_TRACT
  Filled 2022-10-19 (×2): qty 2

## 2022-10-19 MED ORDER — LISINOPRIL 10 MG PO TABS
40.0000 mg | ORAL_TABLET | Freq: Every day | ORAL | Status: DC
  Administered 2022-10-19 – 2022-10-20 (×2): 40 mg via ORAL
  Filled 2022-10-19 (×2): qty 4

## 2022-10-19 NOTE — Progress Notes (Signed)
PROGRESS NOTE    Laura Abbott  CWC:[id number] DOB: Dec 14, 1945 DOA: 10/18/2022 PCP: Newton Hamilton Nation, MD    Brief Narrative:  76 year old female with a history of COPD, lung cancer, hyperlipidemia, recently treated for bronchitis with antibiotics and prednisone, had persistent shortness of breath and wheezing and was admitted to the hospital with COPD exacerbation.  Receiving IV steroids and bronchodilators.   Assessment & Plan:   Principal Problem:   COPD (chronic obstructive pulmonary disease) (HCC) Active Problems:   COPD exacerbation (HCC)   Hypertension   Leukocytosis   Hyperlipidemia   Depression with anxiety   COPD exacerbation -Likely precipitated by recent bronchitis -Completed course of azithromycin prior to admission -Chest x-ray on admission did not show any pneumonia -Started on intravenous steroids -Overall wheezing is improving, although not yet resolved -Wean off oxygen as tolerated -Continue bronchodilators, inhaled steroids -Will likely benefit from outpatient pulmonary evaluation  Depression with anxiety -Continue Prozac and Klonopin  Hyperlipidemia -Continue statin  Hypertension -Continue lisinopril metoprolol   DVT prophylaxis: heparin injection 5,000 Units Start: 10/18/22 2215 SCDs Start: 10/18/22 2126  Code Status: Full code Family Communication: Discussed with daughter and husband at the bedside Disposition Plan: Status is: Inpatient Remains inpatient appropriate because: Continue treatment of COPD, anticipate discharge in next 24 hours if she continues to improve     Consultants:    Procedures:    Antimicrobials:      Subjective: Overall, she feels that her breathing has improved since admission.  She is still wheezing and has significant coughing spells.  She is still requiring supplemental oxygen.  Objective: Vitals:   10/19/22 0511 10/19/22 0927 10/19/22 1024 10/19/22 1518  BP: (!) 137/54  (!) 151/62   Pulse: 97  (!)  101   Resp: 19     Temp: 97.6 F (36.4 C)     TempSrc:      SpO2: 94% 96%  94%  Weight:      Height:        Intake/Output Summary (Last 24 hours) at 10/19/2022 1936 Last data filed at 10/19/2022 1733 Gross per 24 hour  Intake 600 ml  Output --  Net 600 ml   Filed Weights   10/18/22 1546 10/18/22 2100 10/19/22 0500  Weight: 89.8 kg 91.7 kg 90.6 kg    Examination:  General exam: Appears calm and comfortable  Respiratory system: Bilateral wheezing. Respiratory effort normal. Cardiovascular system: S1 & S2 heard, RRR. No JVD, murmurs, rubs, gallops or clicks. No pedal edema. Gastrointestinal system: Abdomen is nondistended, soft and nontender. No organomegaly or masses felt. Normal bowel sounds heard. Central nervous system: Alert and oriented. No focal neurological deficits. Extremities: Symmetric 5 x 5 power. Skin: No rashes, lesions or ulcers Psychiatry: Judgement and insight appear normal. Mood & affect appropriate.     Data Reviewed: I have personally reviewed following labs and imaging studies  CBC: Recent Labs  Lab 10/18/22 1616 10/19/22 0409  WBC 14.0* 16.1*  NEUTROABS 12.1* 14.4*  HGB 12.0 11.4*  HCT 38.6 36.6  MCV 92.6 92.9  PLT 382 761   Basic Metabolic Panel: Recent Labs  Lab 10/18/22 1552 10/19/22 0409  NA 138 138  K 4.0 4.4  CL 103 100  CO2 27 26  GLUCOSE 159* 149*  BUN 17 16  CREATININE 0.72 0.65  CALCIUM 9.3 9.5  MG  --  1.8   GFR: Estimated Creatinine Clearance: 64.9 mL/min (by C-G formula based on SCr of 0.65 mg/dL). Liver Function Tests: Recent Labs  Lab 10/18/22 1552 10/19/22 0409  AST 35 28  ALT 31 25  ALKPHOS 101 77  BILITOT 0.7 0.5  PROT 8.0 6.6  ALBUMIN 3.9 3.3*   No results for input(s): "LIPASE", "AMYLASE" in the last 168 hours. No results for input(s): "AMMONIA" in the last 168 hours. Coagulation Profile: No results for input(s): "INR", "PROTIME" in the last 168 hours. Cardiac Enzymes: No results for input(s):  "CKTOTAL", "CKMB", "CKMBINDEX", "TROPONINI" in the last 168 hours. BNP (last 3 results) No results for input(s): "PROBNP" in the last 8760 hours. HbA1C: No results for input(s): "HGBA1C" in the last 72 hours. CBG: No results for input(s): "GLUCAP" in the last 168 hours. Lipid Profile: No results for input(s): "CHOL", "HDL", "LDLCALC", "TRIG", "CHOLHDL", "LDLDIRECT" in the last 72 hours. Thyroid Function Tests: No results for input(s): "TSH", "T4TOTAL", "FREET4", "T3FREE", "THYROIDAB" in the last 72 hours. Anemia Panel: No results for input(s): "VITAMINB12", "FOLATE", "FERRITIN", "TIBC", "IRON", "RETICCTPCT" in the last 72 hours. Sepsis Labs: Recent Labs  Lab 10/18/22 1826  PROCALCITON <0.10    No results found for this or any previous visit (from the past 240 hour(s)).       Radiology Studies: No results found.      Scheduled Meds:  arformoterol  15 mcg Nebulization BID   aspirin EC  81 mg Oral Daily   budesonide (PULMICORT) nebulizer solution  0.25 mg Nebulization BID   clonazePAM  0.5 mg Oral QHS   dextromethorphan-guaiFENesin  1 tablet Oral BID   FLUoxetine  40 mg Oral Daily   gabapentin  300 mg Oral BID   heparin  5,000 Units Subcutaneous Q8H   ipratropium-albuterol  3 mL Nebulization Q6H   lisinopril  40 mg Oral Daily   methylPREDNISolone (SOLU-MEDROL) injection  125 mg Intravenous Q12H   Followed by   Derrill Memo ON 10/20/2022] predniSONE  40 mg Oral Q breakfast   metoprolol succinate  50 mg Oral Daily   pantoprazole  40 mg Oral Daily   rosuvastatin  10 mg Oral Daily   umeclidinium bromide  1 puff Inhalation Daily   Continuous Infusions:   LOS: 0 days    Time spent: 35 minutes    Kathie Dike, MD [facility]   If 7PM-7AM, please contact night-coverage [email or web address]  10/19/2022, 7:36 PM

## 2022-10-19 NOTE — Progress Notes (Signed)
  Transition of Care [facility]) Screening Note   Patient Details  Name: Laura Abbott Date of Birth: 12/19/1945   Transition of Care [facility]) CM/SW Contact:    Laura Gully, LCSW Phone Number: 10/19/2022, 3:07 PM    Transition of Care Department [facility]) has reviewed patient and no TOC needs have been identified at this time. We will continue to monitor patient advancement through interdisciplinary progression rounds. If new patient transition needs arise, please place a TOC consult.  Genesis Paget, Clydene Pugh, LCSW

## 2022-10-20 DIAGNOSIS — I10 Essential (primary) hypertension: Secondary | ICD-10-CM | POA: Diagnosis not present

## 2022-10-20 DIAGNOSIS — J441 Chronic obstructive pulmonary disease with (acute) exacerbation: Secondary | ICD-10-CM | POA: Diagnosis not present

## 2022-10-20 DIAGNOSIS — E782 Mixed hyperlipidemia: Secondary | ICD-10-CM | POA: Diagnosis not present

## 2022-10-20 DIAGNOSIS — F418 Other specified anxiety disorders: Secondary | ICD-10-CM | POA: Diagnosis not present

## 2022-10-20 MED ORDER — ALBUTEROL SULFATE (2.5 MG/3ML) 0.083% IN NEBU: 2.5000 mg | INHALATION_SOLUTION | RESPIRATORY_TRACT | 12 refills | Status: AC | PRN

## 2022-10-20 MED ORDER — PREDNISONE 10 MG PO TABS: ORAL_TABLET | ORAL | 0 refills | Status: DC

## 2022-10-20 MED ORDER — TIZANIDINE HCL 2 MG PO CAPS: 2.0000 mg | ORAL_CAPSULE | Freq: Every day | ORAL | Status: DC | PRN

## 2022-10-20 MED ORDER — DM-GUAIFENESIN ER 30-600 MG PO TB12: 1.0000 | ORAL_TABLET | Freq: Two times a day (BID) | ORAL | 0 refills | Status: DC

## 2022-10-20 MED ORDER — BUDESONIDE 0.25 MG/2ML IN SUSP: 0.2500 mg | Freq: Two times a day (BID) | RESPIRATORY_TRACT | 2 refills | Status: DC

## 2022-10-20 MED ORDER — TIOTROPIUM BROMIDE-OLODATEROL 2.5-2.5 MCG/ACT IN AERS: 2.0000 | INHALATION_SPRAY | Freq: Every day | RESPIRATORY_TRACT | Status: DC

## 2022-10-20 MED ORDER — MELOXICAM 7.5 MG PO TABS: 7.5000 mg | ORAL_TABLET | Freq: Every day | ORAL | Status: DC | PRN

## 2022-10-20 NOTE — Progress Notes (Signed)
SATURATION QUALIFICATIONS: (This note is used to comply with regulatory documentation for home oxygen)  Patient Saturations on Room Air at Rest = 93%  Patient Saturations on Room Air while Ambulating = 91-94%  Patient Saturations on  Liters of oxygen while Ambulating = Did not try since patient was above 90 %   Please briefly explain why patient needs home oxygen: Patient does not need home oxygen at this time, was able to ambulate 100 feet or more and her o2 was between 91-94%

## 2022-10-20 NOTE — Plan of Care (Signed)

## 2022-10-20 NOTE — Progress Notes (Signed)
CSW spoke with Erasmo Downer at [employer] to order home nebulizer - DME will be delivered to the patient's home.  Madilyn Fireman, MSW, LCSW Transitions of Care  Clinical Social Worker II [phone]

## 2022-10-20 NOTE — Discharge Summary (Signed)
Physician Discharge Summary  Laura Abbott GHW:[id number] DOB: 10/13/1946 DOA: 10/18/2022  PCP: Jeannette Nation, MD  Admit date: 10/18/2022 Discharge date: 10/20/2022  Admitted From: Home Disposition: Home  Recommendations for Outpatient Follow-up:  Follow up with PCP in 1-2 weeks Please obtain BMP/CBC in one week Patient referral made for pulmonology  Home Health: Equipment/Devices: Nebulizer machine  Discharge Condition: Improved CODE STATUS: Full code Diet recommendation: Heart healthy  Brief/Interim Summary: 76 year old female with a history of COPD, lung cancer, hyperlipidemia, recently treated for bronchitis with antibiotics and prednisone, had persistent shortness of breath and wheezing and was admitted to the hospital with COPD exacerbation.  Receiving IV steroids and bronchodilators.   Discharge Diagnoses:  Principal Problem:   COPD (chronic obstructive pulmonary disease) (HCC) Active Problems:   COPD exacerbation (HCC)   Hypertension   Leukocytosis   Hyperlipidemia   Depression with anxiety  COPD exacerbation -Likely precipitated by recent bronchitis -Completed course of azithromycin prior to admission -Chest x-ray on admission did not show any pneumonia -Started on intravenous steroids -Overall wheezing is improved -She is able to wean off of oxygen and is currently ambulating on room air without hypoxia or shortness of breath -Steroids transition to prednisone taper -She will be continued on nebulizer treatments as needed at home -Outpatient referral to pulmonology has been made   Depression with anxiety -Continue Prozac and Klonopin   Hyperlipidemia -Continue statin   Hypertension -Continue lisinopril metoprolol  Discharge Instructions  Discharge Instructions     Ambulatory referral to Pulmonology   Complete by: As directed    Reason for referral: Asthma/COPD   Diet - low sodium heart healthy   Complete by: As directed    For home use only  DME Nebulizer machine   Complete by: As directed    Patient needs a nebulizer to treat with the following condition: COPD (chronic obstructive pulmonary disease) ([place])   Length of Need: Lifetime   Increase activity slowly   Complete by: As directed       Allergies as of 10/20/2022   No Known Allergies      Medication List     STOP taking these medications    azithromycin 500 MG tablet Commonly known as: ZITHROMAX   Spiriva HandiHaler 18 MCG inhalation capsule Generic drug: tiotropium       TAKE these medications    albuterol 108 (90 Base) MCG/ACT inhaler Commonly known as: VENTOLIN HFA Inhale 2 puffs into the lungs 3 (three) times daily. Also use the inhaler every 2 hours if needed for worsening shortness of breath and wheezing. What changed: Another medication with the same name was added. Make sure you understand how and when to take each.   albuterol (2.5 MG/3ML) 0.083% nebulizer solution Commonly known as: PROVENTIL Take 3 mLs (2.5 mg total) by nebulization every 2 (two) hours as needed for shortness of breath. What changed: You were already taking a medication with the same name, and this prescription was added. Make sure you understand how and when to take each.   aspirin EC 81 MG tablet Take 1 tablet (81 mg total) by mouth daily. Swallow whole.   budesonide 0.25 MG/2ML nebulizer solution Commonly known as: PULMICORT Take 2 mLs (0.25 mg total) by nebulization 2 (two) times daily.   Calcium 600-200 MG-UNIT tablet Take 1 tablet by mouth daily.   clonazePAM 0.5 MG tablet Commonly known as: KLONOPIN Take 0.5 mg by mouth at bedtime.   dextromethorphan-guaiFENesin 30-600 MG 12hr tablet Commonly known as:  MUCINEX DM Take 1 tablet by mouth 2 (two) times daily.   FLUoxetine 40 MG capsule Commonly known as: PROZAC Take 40 mg by mouth daily.   furosemide 20 MG tablet Commonly known as: LASIX Take 20 mg by mouth as needed.   gabapentin 300 MG  capsule Commonly known as: NEURONTIN Take 300 mg by mouth 2 (two) times daily.   lisinopril 40 MG tablet Commonly known as: ZESTRIL Take 40 mg by mouth daily.   meloxicam 7.5 MG tablet Commonly known as: MOBIC Take 1 tablet (7.5 mg total) by mouth daily as needed for pain. What changed:  when to take this reasons to take this   metoprolol succinate 50 MG 24 hr tablet Commonly known as: TOPROL-XL Take 50 mg by mouth daily.   nitroGLYCERIN 0.4 MG SL tablet Commonly known as: NITROSTAT Place 1 tablet (0.4 mg total) under the tongue every 5 (five) minutes as needed for chest pain.   omeprazole 40 MG capsule Commonly known as: PRILOSEC Take 40 mg by mouth daily.   predniSONE 10 MG tablet Commonly known as: DELTASONE Take 40mg  po daily for 2 days then 30mg  daily for 2 days then 20mg  daily for 2 days then 10mg  daily for 2 days then stop Start taking on: October 21, 2022 What changed:  medication strength how much to take how to take this when to take this additional instructions   rosuvastatin 10 MG tablet Commonly known as: CRESTOR Take 10 mg by mouth daily.   Tiotropium Bromide-Olodaterol 2.5-2.5 MCG/ACT Aers Inhale 2 puffs into the lungs daily.   tizanidine 2 MG capsule Commonly known as: ZANAFLEX Take 1 capsule (2 mg total) by mouth daily as needed for muscle spasms. What changed:  when to take this reasons to take this               Durable Medical Equipment  (From admission, onward)           Start     Ordered   10/20/22 0000  For home use only DME Nebulizer machine       Question Answer Comment  Patient needs a nebulizer to treat with the following condition COPD (chronic obstructive pulmonary disease) ([place])   Length of Need Lifetime      10/20/22 1338            No Known Allergies  Consultations:    Procedures/Studies: No results found.    Subjective: Overall breathing has improved.  She is able to ambulate on room air  without difficulty.  Discharge Exam: Vitals:   10/20/22 0406 10/20/22 0500 10/20/22 0831 10/20/22 0855  BP: (!) 155/70     Pulse: 77  (!) 102   Resp: 19     Temp: 98.1 F (36.7 C)     TempSrc:      SpO2: 95%   94%  Weight:  90.6 kg    Height:        General: Pt is alert, awake, not in acute distress Cardiovascular: RRR, S1/S2 +, no rubs, no gallops Respiratory: Normal respiratory effort, mild wheeze bilaterally Abdominal: Soft, NT, ND, bowel sounds + Extremities: no edema, no cyanosis    The results of significant diagnostics from this hospitalization (including imaging, microbiology, ancillary and laboratory) are listed below for reference.     Microbiology: No results found for this or any previous visit (from the past 240 hour(s)).   Labs: BNP (last 3 results) No results for input(s): "BNP" in the last 8760  hours. Basic Metabolic Panel: Recent Labs  Lab 10/18/22 1552 10/19/22 0409  NA 138 138  K 4.0 4.4  CL 103 100  CO2 27 26  GLUCOSE 159* 149*  BUN 17 16  CREATININE 0.72 0.65  CALCIUM 9.3 9.5  MG  --  1.8   Liver Function Tests: Recent Labs  Lab 10/18/22 1552 10/19/22 0409  AST 35 28  ALT 31 25  ALKPHOS 101 77  BILITOT 0.7 0.5  PROT 8.0 6.6  ALBUMIN 3.9 3.3*   No results for input(s): "LIPASE", "AMYLASE" in the last 168 hours. No results for input(s): "AMMONIA" in the last 168 hours. CBC: Recent Labs  Lab 10/18/22 1616 10/19/22 0409  WBC 14.0* 16.1*  NEUTROABS 12.1* 14.4*  HGB 12.0 11.4*  HCT 38.6 36.6  MCV 92.6 92.9  PLT 382 364   Cardiac Enzymes: No results for input(s): "CKTOTAL", "CKMB", "CKMBINDEX", "TROPONINI" in the last 168 hours. BNP: Invalid input(s): "POCBNP" CBG: No results for input(s): "GLUCAP" in the last 168 hours. D-Dimer No results for input(s): "DDIMER" in the last 72 hours. Hgb A1c No results for input(s): "HGBA1C" in the last 72 hours. Lipid Profile No results for input(s): "CHOL", "HDL", "LDLCALC", "TRIG",  "CHOLHDL", "LDLDIRECT" in the last 72 hours. Thyroid function studies No results for input(s): "TSH", "T4TOTAL", "T3FREE", "THYROIDAB" in the last 72 hours.  Invalid input(s): "FREET3" Anemia work up No results for input(s): "VITAMINB12", "FOLATE", "FERRITIN", "TIBC", "IRON", "RETICCTPCT" in the last 72 hours. Urinalysis    Component Value Date/Time   COLORURINE YELLOW 12/11/2018 [place] 12/11/2018 0853   LABSPEC 1.019 12/11/2018 0853   PHURINE 5.0 12/11/2018 0853   GLUCOSEU NEGATIVE 12/11/2018 0853   HGBUR NEGATIVE 12/11/2018 0853   BILIRUBINUR NEGATIVE 12/11/2018 [place] 12/11/2018 0853   PROTEINUR Negative 11/22/2020 1053   PROTEINUR NEGATIVE 12/11/2018 0853   NITRITE neg 11/22/2020 1053   NITRITE NEGATIVE 12/11/2018 0853   LEUKOCYTESUR Trace (A) 11/22/2020 1053   Sepsis Labs Recent Labs  Lab 10/18/22 1616 10/19/22 0409  WBC 14.0* 16.1*   Microbiology No results found for this or any previous visit (from the past 240 hour(s)).   Time coordinating discharge: 73mins  SIGNED:   Kathie Dike, MD  [facility] 10/20/2022, 6:42 PM   If 7PM-7AM, please contact night-coverage [email or web address]

## 2022-10-25 ENCOUNTER — Ambulatory Visit: Payer: Medicare HMO | Admitting: Internal Medicine

## 2022-10-25 ENCOUNTER — Encounter: Payer: Self-pay | Admitting: Internal Medicine

## 2022-10-25 VITALS — BP 134/78 | HR 74 | Temp 98.0°F | Ht 62.5 in | Wt 198.4 lb

## 2022-10-25 DIAGNOSIS — I10 Essential (primary) hypertension: Secondary | ICD-10-CM | POA: Diagnosis not present

## 2022-10-25 DIAGNOSIS — M549 Dorsalgia, unspecified: Secondary | ICD-10-CM | POA: Diagnosis not present

## 2022-10-25 DIAGNOSIS — J449 Chronic obstructive pulmonary disease, unspecified: Secondary | ICD-10-CM | POA: Diagnosis not present

## 2022-10-25 DIAGNOSIS — K219 Gastro-esophageal reflux disease without esophagitis: Secondary | ICD-10-CM | POA: Diagnosis not present

## 2022-10-25 MED ORDER — OLMESARTAN MEDOXOMIL 40 MG PO TABS: ORAL_TABLET | ORAL | 11 refills | Status: DC

## 2022-10-25 NOTE — Progress Notes (Signed)
Laura Abbott, female    DOB: 02/07/1946    MRN: [id number]   Brief patient profile:  71  yowf  quit smoking 09/2018  at wt around 200   referred to pulmonary clinic in [place]  10/25/2022 by Dr Laura Abbott from [facility]  for copd eval s/p admit  Admit date: 10/18/2022 Discharge date: 10/20/2022 Home Health: Equipment/Devices: Nebulizer machine   Discharge Condition: Improved CODE STATUS: Full code Diet recommendation: Heart healthy   Brief/Interim Summary: 76 year old female with a history of COPD, lung cancer, hyperlipidemia, 3 d prior treated for bronchitis with antibiotics and prednisone, had persistent shortness of breath and wheezing and was admitted to the hospital with COPD exacerbation.  Receiving IV steroids and bronchodilators.    Discharge Diagnoses:  Principal Problem:   COPD exacerbation ([place])   Hypertension   Leukocytosis   Hyperlipidemia   Depression with anxiety   COPD exacerbation -Likely precipitated by recent bronchitis -Completed course of azithromycin prior to admission -Chest x-ray on admission did not show any pneumonia -Started on intravenous steroids -Overall wheezing is improved -She is able to wean off of oxygen and is currently ambulating on room air without hypoxia or shortness of breath -Steroids transition to prednisone taper -She will be continued on nebulizer treatments as needed at home -Outpatient referral to pulmonology has been made   Depression with anxiety -Continue Prozac and Klonopin   Hyperlipidemia -Continue statin   Hypertension -Continue lisinopril metoprolol     History of Present Illness  10/25/2022  Pulmonary/ 1st office eval/ [facility] / [facility]  Dyspnea:  [employer] is a challenge/ in and out ok but can't do the whole store Cough:  assoc with choking sensation daytime  only, no excess mucus  Sleep: bed is flat one pillow s resp cc  SABA use: neb this am only  02: none Covid vax x 3  No obvious day to day or daytime  pattern/variability or assoc excess/ purulent sputum or mucus plugs or hemoptysis or cp or chest tightness, subjective wheeze or overt sinus or hb symptoms.   Sleeping as above  without nocturnal  or early am exacerbation  of respiratory  c/o's or need for noct saba. Also denies any obvious fluctuation of symptoms with weather or environmental changes or other aggravating or alleviating factors except as outlined above   No unusual exposure hx or h/o childhood pna/ asthma or knowledge of premature birth.  Current Allergies, Complete Past Medical History, Past Surgical History, Family History, and Social History were reviewed in [employer] record.  ROS  The following are not active complaints unless bolded Hoarseness, sore throat, dysphagia, dental problems, itching, sneezing,  nasal congestion or discharge of excess mucus or purulent secretions, ear ache,   fever, chills, sweats, unintended wt loss or wt gain, classically pleuritic or exertional cp,  orthopnea pnd or arm/hand swelling  or leg swelling, presyncope, palpitations, abdominal pain, anorexia, nausea, vomiting, diarrhea  or change in bowel habits or change in bladder habits, change in stools or change in urine, dysuria, hematuria,  rash, arthralgias, visual complaints, headache, numbness, weakness or ataxia or problems with walking or coordination,  change in mood or  memory.             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, right DCIS   COPD (chronic obstructive pulmonary disease) ([place])    COPD  exacerbation ([place]) 01/03/2014   With hypoxia   Depression    Emphysema of lung ([place])    Family history of breast cancer    Family history of colon cancer    Family history of kidney cancer    Family history of lung cancer    Family history of pancreatic cancer    GERD (gastroesophageal reflux disease)    History of kidney stones     Hyperlipidemia    Hypertension    Insomnia    Lung cancer ([place])    Obesity 01/05/2014   Osteoporosis    Osteoporosis, unspecified 12/01/2013   Personal history of radiation therapy    rt breast   Pneumonia    x2   Tobacco abuse 12/01/2013   Ulnar nerve impingement, left     Outpatient Medications Prior to Visit  Medication Sig Dispense Refill   albuterol (PROVENTIL HFA;VENTOLIN HFA) 108 (90 BASE) MCG/ACT inhaler Inhale 2 puffs into the lungs 3 (three) times daily. Also use the inhaler every 2 hours if needed for worsening shortness of breath and wheezing. 1 Inhaler 3   albuterol (PROVENTIL) (2.5 MG/3ML) 0.083% nebulizer solution Take 3 mLs (2.5 mg total) by nebulization every 2 (two) hours as needed for shortness of breath. 75 mL 12   aspirin EC 81 MG tablet Take 1 tablet (81 mg total) by mouth daily. Swallow whole. 90 tablet 3   budesonide (PULMICORT) 0.25 MG/2ML nebulizer solution Take 2 mLs (0.25 mg total) by nebulization 2 (two) times daily. 60 mL 2   Calcium 600-200 MG-UNIT per tablet Take 1 tablet by mouth daily.     clonazePAM (KLONOPIN) 0.5 MG tablet Take 0.5 mg by mouth at bedtime.     dextromethorphan-guaiFENesin (MUCINEX DM) 30-600 MG 12hr tablet Take 1 tablet by mouth 2 (two) times daily. 30 tablet 0   FLUoxetine (PROZAC) 40 MG capsule Take 40 mg by mouth daily.     furosemide (LASIX) 20 MG tablet Take 20 mg by mouth as needed.     gabapentin (NEURONTIN) 300 MG capsule Take 300 mg by mouth 2 (two) times daily.     lisinopril (ZESTRIL) 40 MG tablet Take 40 mg by mouth daily.     meloxicam (MOBIC) 7.5 MG tablet Take 1 tablet (7.5 mg total) by mouth daily as needed for pain.     metoprolol succinate (TOPROL-XL) 50 MG 24 hr tablet Take 50 mg by mouth daily.     omeprazole (PRILOSEC) 40 MG capsule Take 40 mg by mouth daily.      predniSONE (DELTASONE) 10 MG tablet Take 40mg  po daily for 2 days then 30mg  daily for 2 days then 20mg  daily for 2 days then 10mg  daily for 2 days then stop  20 tablet 0   rosuvastatin (CRESTOR) 10 MG tablet Take 10 mg by mouth daily.     Tiotropium Bromide-Olodaterol 2.5-2.5 MCG/ACT AERS Inhale 2 puffs into the lungs daily.     tizanidine (ZANAFLEX) 2 MG capsule Take 1 capsule (2 mg total) by mouth daily as needed for muscle spasms.     nitroGLYCERIN (NITROSTAT) 0.4 MG SL tablet Place 1 tablet (0.4 mg total) under the tongue every 5 (five) minutes as needed for chest pain. (Patient not taking: Reported on 05/02/2022) 25 tablet 3   No facility-administered medications prior to visit.     Objective:     BP 134/78   Pulse 74   Temp 98 F (36.7 C)   Ht 5' 2.5" (1.588 m)  Wt 198 lb 6.4 oz (90 kg)   SpO2 94% Comment: ra  BMI 35.71 kg/m   SpO2: 94 % (ra)  Amb slt hoarse wf / dry raspy upper airway cough   HEENT : Oropharynx  clear   Nasal turbinates nl    NECK :  without  apparent JVD/ palpable Nodes/TM    LUNGS: no acc muscle use,  Min barrel  contour chest wall with bilateral  slightly decreased bs s audible wheeze and  without cough on insp or exp maneuvers and min  Hyperresonant  to  percussion bilaterally    CV:  RRR  no s3 or murmur or increase in P2, and no edema   ABD: obese  soft and nontender with pos end  insp Hoover's  in the supine position.  No bruits or organomegaly appreciated   MS:  Nl gait/ ext warm without deformities Or obvious joint restrictions  calf tenderness, cyanosis or clubbing     SKIN: warm and dry without lesions    NEURO:  alert, approp, nl sensorium with  no motor or cerebellar deficits apparent.        I personally reviewed images and agree with radiology impression as follows:  CXR:   portable 10/18/22 No active disease.      Assessment   COPD GOLD 0/ AB Stopped smoking 2019 - PFT's  12/10/18  FEV1 1.40 (68 % ) ratio 0.72  p 3 % improvement from saba p Breo prior to study with DLCO  12.12 (65%)   and FV curve min concave and ERV 20 at wt 203    - 10/25/2022  After extensive coaching  inhaler device,  effectiveness =    75% smi (short ti)  - try off acei 10/25/2022   DDX of  difficult airways management almost all start with A and  include Adherence, Ace Inhibitors, Acid Reflux, Active Sinus Disease, Alpha 1 Antitripsin deficiency, Anxiety masquerading as Airways dz,  ABPA,  Allergy(esp in young), Aspiration (esp in elderly), Adverse effects of meds,  Active smoking or vaping, A bunch of PE's (a small clot burden can't cause this syndrome unless there is already severe underlying pulm or vascular dz with poor reserve) plus two Bs  = Bronchiectasis and Beta blocker use..and one C= CHF  Adherence is always the initial "prime suspect" and is a multilayered concern that requires a "trust but verify" approach in every patient - starting with knowing how to use medications, especially inhalers, correctly, keeping up with refills and understanding the fundamental difference between maintenance and prns vs those medications only taken for a very short course and then stopped and not refilled.  - see hfa /smi teaching  - return with all meds in hand using a trust but verify approach to confirm accurate Medication  Reconciliation The principal here is that until we are certain that the  patients are doing what we've asked, it makes no sense to ask them to do more.   ACEi adverse effects at the  top of the usual list of suspects and the only way to rule it out is a trial off > see a/p    ? Acid (or non-acid) GERD > always difficult to exclude as up to 75% of pts in some series report no assoc GI/ Heartburn symptoms> rec continue max (24h)  acid suppression    ?allergy/ asthma >  continue bud bid per neb/ recheck cbc with diff next ov off steroids  ? Alpha one AT >  check phenotype next ov     Essential hypertension Changed acei to arb 10/25/2022 due to choking sensation   In the best review of chronic cough to date ( NEJM 2016 375 1540-0867) ,  ACEi are now felt to cause cough in up to   20% of pts which is a 4 fold increase from previous reports(dry raspy daytime present in this case)  and does not include the variety of non-specific complaints we see in pulmonary clinic in pts on ACEi but previously attributed to another dx like  Copd/asthma and  include PNDS, throat fullness and chest congestion, "bronchitis", unexplained dyspnea and noct "strangling" sensations, and hoarseness, but also  atypical /refractory GERD symptoms like dysphagia and "bad heartburn"   The only way I know  to prove this is not an "ACEi Case" is a trial off ACEi x a minimum of 6 weeks then regroup.   >>> try benicar 40 mg daily and regroup in 6 weeks          Each maintenance medication was reviewed in detail including emphasizing most importantly the difference between maintenance and prns and under what circumstances the prns are to be triggered using an action plan format where appropriate.  Total time for H and P, chart review, counseling, reviewing hfa/smi device(s) and generating customized AVS unique to this office visit / same day charting  > 45 min new pt eval           Christinia Gully, MD 10/25/2022

## 2022-10-25 NOTE — Assessment & Plan Note (Addendum)
Stopped smoking 2019 - PFT's  12/10/18  FEV1 1.40 (68 % ) ratio 0.72  p 3 % improvement from saba p Breo prior to study with DLCO  12.12 (65%)   and FV curve min concave and ERV 20 at wt 203    - 10/25/2022  After extensive coaching inhaler device,  effectiveness =    75% smi (short ti)  - try off acei 10/25/2022   DDX of  difficult airways management almost all start with A and  include Adherence, Ace Inhibitors, Acid Reflux, Active Sinus Disease, Alpha 1 Antitripsin deficiency, Anxiety masquerading as Airways dz,  ABPA,  Allergy(esp in young), Aspiration (esp in elderly), Adverse effects of meds,  Active smoking or vaping, A bunch of PE's (a small clot burden can't cause this syndrome unless there is already severe underlying pulm or vascular dz with poor reserve) plus two Bs  = Bronchiectasis and Beta blocker use..and one C= CHF  Adherence is always the initial "prime suspect" and is a multilayered concern that requires a "trust but verify" approach in every patient - starting with knowing how to use medications, especially inhalers, correctly, keeping up with refills and understanding the fundamental difference between maintenance and prns vs those medications only taken for a very short course and then stopped and not refilled.  - see hfa /smi teaching  - return with all meds in hand using a trust but verify approach to confirm accurate Medication  Reconciliation The principal here is that until we are certain that the  patients are doing what we've asked, it makes no sense to ask them to do more.   ACEi adverse effects at the  top of the usual list of suspects and the only way to rule it out is a trial off > see a/p    ? Acid (or non-acid) GERD > always difficult to exclude as up to 75% of pts in some series report no assoc GI/ Heartburn symptoms> rec continue max (24h)  acid suppression    ?allergy/ asthma >  continue bud bid per neb/ recheck cbc with diff next ov off steroids  ? Alpha one AT  > check phenotype next ov

## 2022-10-25 NOTE — Assessment & Plan Note (Signed)
Changed acei to arb 10/25/2022 due to choking sensation   In the best review of chronic cough to date ( NEJM 2016 375 4037-5436) ,  ACEi are now felt to cause cough in up to  20% of pts which is a 4 fold increase from previous reports(dry raspy daytime present in this case)  and does not include the variety of non-specific complaints we see in pulmonary clinic in pts on ACEi but previously attributed to another dx like  Copd/asthma and  include PNDS, throat fullness and chest congestion, "bronchitis", unexplained dyspnea and noct "strangling" sensations, and hoarseness, but also  atypical /refractory GERD symptoms like dysphagia and "bad heartburn"   The only way I know  to prove this is not an "ACEi Case" is a trial off ACEi x a minimum of 6 weeks then regroup.   >>> try benicar 40 mg daily and regroup in 6 weeks          Each maintenance medication was reviewed in detail including emphasizing most importantly the difference between maintenance and prns and under what circumstances the prns are to be triggered using an action plan format where appropriate.  Total time for H and P, chart review, counseling, reviewing hfa/smi device(s) and generating customized AVS unique to this office visit / same day charting  > 45 min new pt eval

## 2022-10-25 NOTE — Patient Instructions (Addendum)
Plan A = Automatic = Always=    Stiolto  2 puffs each am and budesonide (pulmicort) twice daily in the nebulizer   Work on inhaler technique:  relax and gently blow all the way out then take a nice smooth full deep breath back in, triggering the inhaler at same time you start breathing in.  Hold breath in for at least  5 seconds if you can.  Rinse and gargle with water when done.  If mouth or throat bother you at all,  try brushing teeth/gums/tongue with arm and hammer toothpaste/ make a slurry and gargle and spit out.      Plan B = Backup (to supplement plan A, not to replace it) Only use your albuterol inhaler as a rescue medication to be used if you can't catch your breath by resting or doing a relaxed purse lip breathing pattern.  - The less you use it, the better it will work when you need it. - Ok to use the inhaler up to 2 puffs  every 4 hours if you must but call for appointment if use goes up over your usual need - Don't leave home without it !!  (think of it like the spare tire for your car)   Plan C = Crisis (instead of Plan B but only if Plan B stops working) - only use your albuterol nebulizer if you first try Plan B and it fails to help > ok to use the nebulizer up to every 4 hours but if start needing it regularly call for immediate appointment   Stop lisinopril and start olmesartan 40 mg one daily in its place   Please schedule a follow up office visit in 6 weeks, call sooner if needed

## 2022-10-31 ENCOUNTER — Other Ambulatory Visit (HOSPITAL_COMMUNITY): Payer: Medicare HMO

## 2022-10-31 ENCOUNTER — Inpatient Hospital Stay: Payer: Medicare HMO

## 2022-11-01 ENCOUNTER — Ambulatory Visit (HOSPITAL_COMMUNITY)
Admission: RE | Admit: 2022-11-01 | Discharge: 2022-11-01 | Disposition: A | Discharge status: Home / Self Care | Payer: Medicare HMO | Source: Ambulatory Visit | Attending: Hematology | Admitting: Hematology

## 2022-11-01 ENCOUNTER — Inpatient Hospital Stay: Payer: Medicare HMO | Attending: Hematology

## 2022-11-01 DIAGNOSIS — Z8379 Family history of other diseases of the digestive system: Secondary | ICD-10-CM | POA: Diagnosis not present

## 2022-11-01 DIAGNOSIS — Z800 Family history of malignant neoplasm of digestive organs: Secondary | ICD-10-CM | POA: Insufficient documentation

## 2022-11-01 DIAGNOSIS — M542 Cervicalgia: Secondary | ICD-10-CM | POA: Diagnosis not present

## 2022-11-01 DIAGNOSIS — Z803 Family history of malignant neoplasm of breast: Secondary | ICD-10-CM | POA: Insufficient documentation

## 2022-11-01 DIAGNOSIS — I700 Atherosclerosis of aorta: Secondary | ICD-10-CM | POA: Diagnosis not present

## 2022-11-01 DIAGNOSIS — Z8269 Family history of other diseases of the musculoskeletal system and connective tissue: Secondary | ICD-10-CM | POA: Insufficient documentation

## 2022-11-01 DIAGNOSIS — J9811 Atelectasis: Secondary | ICD-10-CM | POA: Diagnosis not present

## 2022-11-01 DIAGNOSIS — Z806 Family history of leukemia: Secondary | ICD-10-CM | POA: Insufficient documentation

## 2022-11-01 DIAGNOSIS — R059 Cough, unspecified: Secondary | ICD-10-CM | POA: Diagnosis not present

## 2022-11-01 DIAGNOSIS — M549 Dorsalgia, unspecified: Secondary | ICD-10-CM | POA: Insufficient documentation

## 2022-11-01 DIAGNOSIS — I10 Essential (primary) hypertension: Secondary | ICD-10-CM | POA: Diagnosis not present

## 2022-11-01 DIAGNOSIS — Z801 Family history of malignant neoplasm of trachea, bronchus and lung: Secondary | ICD-10-CM | POA: Diagnosis not present

## 2022-11-01 DIAGNOSIS — Z8249 Family history of ischemic heart disease and other diseases of the circulatory system: Secondary | ICD-10-CM | POA: Diagnosis not present

## 2022-11-01 DIAGNOSIS — I2510 Atherosclerotic heart disease of native coronary artery without angina pectoris: Secondary | ICD-10-CM | POA: Insufficient documentation

## 2022-11-01 DIAGNOSIS — Z87891 Personal history of nicotine dependence: Secondary | ICD-10-CM | POA: Diagnosis not present

## 2022-11-01 DIAGNOSIS — G894 Chronic pain syndrome: Secondary | ICD-10-CM | POA: Insufficient documentation

## 2022-11-01 DIAGNOSIS — C3490 Malignant neoplasm of unspecified part of unspecified bronchus or lung: Secondary | ICD-10-CM

## 2022-11-01 DIAGNOSIS — Z8051 Family history of malignant neoplasm of kidney: Secondary | ICD-10-CM | POA: Insufficient documentation

## 2022-11-01 DIAGNOSIS — R0602 Shortness of breath: Secondary | ICD-10-CM | POA: Insufficient documentation

## 2022-11-01 DIAGNOSIS — Z923 Personal history of irradiation: Secondary | ICD-10-CM | POA: Insufficient documentation

## 2022-11-01 DIAGNOSIS — C7A8 Other malignant neuroendocrine tumors: Secondary | ICD-10-CM | POA: Insufficient documentation

## 2022-11-01 DIAGNOSIS — Z87442 Personal history of urinary calculi: Secondary | ICD-10-CM | POA: Insufficient documentation

## 2022-11-01 DIAGNOSIS — J449 Chronic obstructive pulmonary disease, unspecified: Secondary | ICD-10-CM | POA: Diagnosis not present

## 2022-11-01 DIAGNOSIS — Z79899 Other long term (current) drug therapy: Secondary | ICD-10-CM | POA: Insufficient documentation

## 2022-11-01 DIAGNOSIS — J439 Emphysema, unspecified: Secondary | ICD-10-CM | POA: Diagnosis not present

## 2022-11-01 DIAGNOSIS — Z8261 Family history of arthritis: Secondary | ICD-10-CM | POA: Insufficient documentation

## 2022-11-01 LAB — CBC WITH DIFFERENTIAL/PLATELET
Abs Immature Granulocytes: 0.07 10*3/uL (ref 0.00–0.07)
Basophils Absolute: 0 10*3/uL (ref 0.0–0.1)
Basophils Relative: 0 %
Eosinophils Absolute: 0.1 10*3/uL (ref 0.0–0.5)
Eosinophils Relative: 1 %
HCT: 41.2 % (ref 36.0–46.0)
Hemoglobin: 12.5 g/dL (ref 12.0–15.0)
Immature Granulocytes: 1 %
Lymphocytes Relative: 16 %
Lymphs Abs: 2 10*3/uL (ref 0.7–4.0)
MCH: 28.7 pg (ref 26.0–34.0)
MCHC: 30.3 g/dL (ref 30.0–36.0)
MCV: 94.5 fL (ref 80.0–100.0)
Monocytes Absolute: 0.8 10*3/uL (ref 0.1–1.0)
Monocytes Relative: 7 %
Neutro Abs: 9.6 10*3/uL — ABNORMAL HIGH (ref 1.7–7.7)
Neutrophils Relative %: 75 %
Platelets: 322 10*3/uL (ref 150–400)
RBC: 4.36 MIL/uL (ref 3.87–5.11)
RDW: 15.7 % — ABNORMAL HIGH (ref 11.5–15.5)
WBC: 12.6 10*3/uL — ABNORMAL HIGH (ref 4.0–10.5)
nRBC: 0 % (ref 0.0–0.2)

## 2022-11-01 LAB — COMPREHENSIVE METABOLIC PANEL
ALT: 25 U/L (ref 0–44)
AST: 22 U/L (ref 15–41)
Albumin: 3.5 g/dL (ref 3.5–5.0)
Alkaline Phosphatase: 75 U/L (ref 38–126)
Anion gap: 7 (ref 5–15)
BUN: 13 mg/dL (ref 8–23)
CO2: 28 mmol/L (ref 22–32)
Calcium: 9.4 mg/dL (ref 8.9–10.3)
Chloride: 103 mmol/L (ref 98–111)
Creatinine, Ser: 0.65 mg/dL (ref 0.44–1.00)
GFR, Estimated: >60 mL/min (ref >60)
Glucose, Bld: 120 mg/dL — ABNORMAL HIGH (ref 70–99)
Potassium: 4.1 mmol/L (ref 3.5–5.1)
Sodium: 138 mmol/L (ref 135–145)
Total Bilirubin: 1 mg/dL (ref 0.3–1.2)
Total Protein: 7.1 g/dL (ref 6.5–8.1)

## 2022-11-07 ENCOUNTER — Inpatient Hospital Stay: Payer: Medicare HMO | Admitting: Hematology

## 2022-11-07 ENCOUNTER — Inpatient Hospital Stay: Payer: Medicare HMO

## 2022-11-07 VITALS — BP 124/45 | HR 77 | Temp 98.6°F | Resp 18 | Ht 63.5 in | Wt 202.4 lb

## 2022-11-07 DIAGNOSIS — C3490 Malignant neoplasm of unspecified part of unspecified bronchus or lung: Secondary | ICD-10-CM | POA: Diagnosis not present

## 2022-11-07 DIAGNOSIS — R0602 Shortness of breath: Secondary | ICD-10-CM | POA: Diagnosis not present

## 2022-11-07 DIAGNOSIS — G894 Chronic pain syndrome: Secondary | ICD-10-CM | POA: Diagnosis not present

## 2022-11-07 DIAGNOSIS — Z95828 Presence of other vascular implants and grafts: Secondary | ICD-10-CM

## 2022-11-07 DIAGNOSIS — J449 Chronic obstructive pulmonary disease, unspecified: Secondary | ICD-10-CM | POA: Diagnosis not present

## 2022-11-07 DIAGNOSIS — J439 Emphysema, unspecified: Secondary | ICD-10-CM | POA: Diagnosis not present

## 2022-11-07 DIAGNOSIS — M542 Cervicalgia: Secondary | ICD-10-CM | POA: Diagnosis not present

## 2022-11-07 DIAGNOSIS — M549 Dorsalgia, unspecified: Secondary | ICD-10-CM | POA: Diagnosis not present

## 2022-11-07 DIAGNOSIS — C7A8 Other malignant neuroendocrine tumors: Secondary | ICD-10-CM | POA: Diagnosis not present

## 2022-11-07 DIAGNOSIS — I2510 Atherosclerotic heart disease of native coronary artery without angina pectoris: Secondary | ICD-10-CM | POA: Diagnosis not present

## 2022-11-07 DIAGNOSIS — I700 Atherosclerosis of aorta: Secondary | ICD-10-CM | POA: Diagnosis not present

## 2022-11-07 MED ORDER — SODIUM CHLORIDE 0.9% FLUSH
10.0000 mL | Freq: Once | INTRAVENOUS | Status: AC
  Administered 2022-11-07: 10 mL via INTRAVENOUS

## 2022-11-07 MED ORDER — HEPARIN SOD (PORK) LOCK FLUSH 100 UNIT/ML IV SOLN
500.0000 [IU] | Freq: Once | INTRAVENOUS | Status: AC
  Administered 2022-11-07: 500 [IU] via INTRAVENOUS

## 2022-11-07 NOTE — Progress Notes (Signed)
Port flushed with good blood return noted. No bruising or swelling at site. Bandaid applied and patient discharged in satisfactory condition. VVS stable with no signs or symptoms of distressed noted. 

## 2022-11-07 NOTE — Patient Instructions (Signed)
[facility]  Discharge Instructions  You were seen and examined today by Dr. Delton Coombes.   He reviewed your most recent labs and scan which revealed that everything looks good.  Please follow up as scheduled.   Thank you for choosing [facility] to provide your oncology and hematology care.   To afford each patient quality time with our provider, please arrive at least 15 minutes before your scheduled appointment time. You may need to reschedule your appointment if you arrive late (10 or more minutes). Arriving late affects you and other patients whose appointments are after yours.  Also, if you miss three or more appointments without notifying the office, you may be dismissed from the clinic at the provider's discretion.    Again, thank you for choosing [facility].  Our hope is that these requests will decrease the amount of time that you wait before being seen by our physicians.   If you have a lab appointment with the [facility] please come in thru the Main Entrance and check in at the main information desk.           _____________________________________________________________  Should you have questions after your visit to [facility], please contact our office at [phone] and follow the prompts.  Our office hours are 8:00 a.m. to 4:30 p.m. Monday - Thursday and 8:00 a.m. to 2:30 p.m. Friday.  Please note that voicemails left after 4:00 p.m. may not be returned until the following business day.  We are closed weekends and all major holidays.  You do have access to a nurse 24-7, just call the main number to the clinic [phone] and do not press any options, hold on the line and a nurse will answer the phone.    For prescription refill requests, have your pharmacy contact our office and allow 72 hours.    Masks are optional in the cancer centers. If you would like for your care team to wear a  mask while they are taking care of you, please let them know. You may have one support person who is at least 75 years old accompany you for your appointments.

## 2022-11-07 NOTE — Progress Notes (Signed)
[place], [place] [address]   CLINIC:  Medical Oncology/Hematology  PCP:  [place], MD [address] / [place] [address] [phone]   REASON FOR VISIT:  Follow-up for left neuroendocrine lung cancer  PRIOR THERAPY:  1. Left upper lobectomy on 12/15/2018. 2. Carboplatin and etoposide x 4 cycles from 01/20/2019 to 03/23/2019.  NGS Results: not done  CURRENT THERAPY: surveillance  BRIEF ONCOLOGIC HISTORY:  Oncology History  DCIS (ductal carcinoma in situ) of breast  10/28/2013 Initial Diagnosis   DCIS (ductal carcinoma in situ) of breast   12/21/2018 Genetic Testing   Negative genetic testing on the multicancer panel.  The Multi-Gene Panel offered by [facility] includes sequencing and/or deletion duplication testing of the following 84 genes: AIP, ALK, APC, ATM, AXIN2,BAP1,  BARD1, BLM, BMPR1A, BRCA1, BRCA2, BRIP1, CASR, CDC73, CDH1, CDK4, CDKN1B, CDKN1C, CDKN2A (p14ARF), CDKN2A (p16INK4a), CEBPA, CHEK2, CTNNA1, DICER1, DIS3L2, EGFR (c.2369C>T, p.Thr790Met variant only), EPCAM (Deletion/duplication testing only), FH, FLCN, GATA2, GPC3, GREM1 (Promoter region deletion/duplication testing only), HOXB13 (c.251G>A, p.Gly84Glu), HRAS, KIT, MAX, MEN1, MET, MITF (c.952G>A, p.Glu318Lys variant only), MLH1, MSH2, MSH3, MSH6, MUTYH, NBN, NF1, NF2, NTHL1, PALB2, PDGFRA, PHOX2B, PMS2, POLD1, POLE, POT1, PRKAR1A, PTCH1, PTEN, RAD50, RAD51C, RAD51D, RB1, RECQL4, RET, RUNX1, SDHAF2, SDHA (sequence changes only), SDHB, SDHC, SDHD, SMAD4, SMARCA4, SMARCB1, SMARCE1, STK11, SUFU, TERC, TERT, TMEM127, TP53, TSC1, TSC2, VHL, WRN and WT1.  The report date is 12/21/2018.   Lung cancer ([place])  12/15/2018 Initial Diagnosis   Lung cancer ([place])   12/21/2018 Genetic Testing   Negative genetic testing on the multicancer panel.  The Multi-Gene Panel offered by [facility] includes sequencing and/or deletion duplication testing of the following 84 genes: AIP, ALK, APC,  ATM, AXIN2,BAP1,  BARD1, BLM, BMPR1A, BRCA1, BRCA2, BRIP1, CASR, CDC73, CDH1, CDK4, CDKN1B, CDKN1C, CDKN2A (p14ARF), CDKN2A (p16INK4a), CEBPA, CHEK2, CTNNA1, DICER1, DIS3L2, EGFR (c.2369C>T, p.Thr790Met variant only), EPCAM (Deletion/duplication testing only), FH, FLCN, GATA2, GPC3, GREM1 (Promoter region deletion/duplication testing only), HOXB13 (c.251G>A, p.Gly84Glu), HRAS, KIT, MAX, MEN1, MET, MITF (c.952G>A, p.Glu318Lys variant only), MLH1, MSH2, MSH3, MSH6, MUTYH, NBN, NF1, NF2, NTHL1, PALB2, PDGFRA, PHOX2B, PMS2, POLD1, POLE, POT1, PRKAR1A, PTCH1, PTEN, RAD50, RAD51C, RAD51D, RB1, RECQL4, RET, RUNX1, SDHAF2, SDHA (sequence changes only), SDHB, SDHC, SDHD, SMAD4, SMARCA4, SMARCB1, SMARCE1, STK11, SUFU, TERC, TERT, TMEM127, TP53, TSC1, TSC2, VHL, WRN and WT1.  The report date is 12/21/2018.   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  The Multi-Gene Panel offered by [facility] includes sequencing and/or deletion duplication testing of the following 84 genes: AIP, ALK, APC, ATM, AXIN2,BAP1,  BARD1, BLM, BMPR1A, BRCA1, BRCA2, BRIP1, CASR, CDC73, CDH1, CDK4, CDKN1B, CDKN1C, CDKN2A (p14ARF), CDKN2A (p16INK4a), CEBPA, CHEK2, CTNNA1, DICER1, DIS3L2, EGFR (c.2369C>T, p.Thr790Met variant only), EPCAM (Deletion/duplication testing only), FH, FLCN, GATA2, GPC3, GREM1 (Promoter region deletion/duplication testing only), HOXB13 (c.251G>A, p.Gly84Glu), HRAS, KIT, MAX, MEN1, MET, MITF (c.952G>A, p.Glu318Lys variant only), MLH1, MSH2, MSH3, MSH6, MUTYH, NBN, NF1, NF2, NTHL1, PALB2, PDGFRA, PHOX2B, PMS2, POLD1, POLE, POT1, PRKAR1A, PTCH1, PTEN, RAD50, RAD51C, RAD51D, RB1, RECQL4, RET, RUNX1, SDHAF2, SDHA (sequence changes only), SDHB, SDHC, SDHD, SMAD4, SMARCA4, SMARCB1, SMARCE1, STK11, SUFU, TERC, TERT, TMEM127, TP53, TSC1, TSC2, VHL, WRN and WT1.  The report date  is 12/21/2018.   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[place])  01/12/2019 Initial Diagnosis   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  [place])   01/20/2019 - 03/26/2019 Chemotherapy   Patient is on Treatment Plan : LUNG SMALL CELL Carboplatin D1 / Etoposide D1-3 q21d       CANCER STAGING:  Cancer Staging  DCIS (ductal carcinoma in situ) of breast Staging form: Breast, AJCC 7th Edition - Clinical stage from 12/01/2013: Stage 0 (Tis (DCIS), N0, cM0) - Signed by Heath Lark, MD on 12/01/2013 - Pathologic: Stage 0 (Tis (DCIS), N0, cM0) - Signed by Heath Lark, MD on 12/01/2013   INTERVAL HISTORY:  [name], a 76 y.o. female, seen for follow-up of for neuroendocrine lung cancer.  She denies any new onset chest pains.  Energy levels are 40%.  Chronic back and neck pain 7 out of 10 intensity and stable.  Shortness of breath from COPD stable.  She is on Pulmicort nebulizer.  REVIEW OF SYSTEMS:  Review of Systems  Constitutional:  Negative for appetite change and fatigue.  Respiratory:  Positive for shortness of breath.   Musculoskeletal:  Positive for back pain (7/10) and neck pain (7/10).  All other systems reviewed and are negative.   PAST MEDICAL/SURGICAL HISTORY:  Past Medical History:  Diagnosis Date   Anxiety    Arthritis    Asthma    Breast cancer ([place])    breast, right DCIS   COPD (chronic obstructive pulmonary disease) (HCC)    COPD exacerbation ([place]) 01/03/2014   With hypoxia   Depression    Emphysema of lung ([place])    Family history of breast cancer    Family history of colon cancer    Family history of kidney cancer    Family history of lung cancer    Family history of pancreatic cancer    GERD (gastroesophageal reflux disease)    History of kidney stones    Hyperlipidemia    Hypertension    Insomnia    Lung cancer ([place])    Obesity 01/05/2014   Osteoporosis    Osteoporosis, unspecified 12/01/2013   Personal history of radiation therapy    rt breast   Pneumonia    x2   Tobacco abuse 12/01/2013    Ulnar nerve impingement, left    Past Surgical History:  Procedure Laterality Date   ABDOMINAL HYSTERECTOMY     BLADDER SURGERY     had tacked   BREAST LUMPECTOMY Right    BREAST LUMPECTOMY WITH NEEDLE LOCALIZATION AND AXILLARY SENTINEL LYMPH NODE BX Right 11/09/2013   Procedure: BREAST LUMPECTOMY WITH NEEDLE LOCALIZATION AND AXILLARY SENTINEL LYMPH NODE BIOPSY;  Surgeon: Harl Bowie, MD;  Location: [facility];  Service: General;  Laterality: Right;   BREAST SURGERY Right    lumpectomy   CATARACT EXTRACTION W/PHACO Right 06/15/2022   Procedure: CATARACT EXTRACTION PHACO AND INTRAOCULAR LENS PLACEMENT ([place]);  Surgeon: Baruch Goldmann, MD;  Location: [facility];  Service: Ophthalmology;  Laterality: Right;  CDE 10.73   CATARACT EXTRACTION W/PHACO Left 06/29/2022   Procedure: CATARACT EXTRACTION PHACO AND INTRAOCULAR LENS PLACEMENT ([facility]);  Surgeon: Baruch Goldmann, MD;  Location: [facility];  Service: Ophthalmology;  Laterality: Left;  CDE: 11.37   CHOLECYSTECTOMY     NODE DISSECTION Left 12/15/2018   Procedure: NODE DISSECTION LEFT LUNG;  Surgeon: Melrose Nakayama, MD;  Location: [facility];  Service: Thoracic;  Laterality: Left;   PORTACATH PLACEMENT Right 01/19/2019   Procedure: INSERTION PORT-A-CATH;  Surgeon: Aviva Signs, MD;  Location: [facility];  Service: General;  Laterality: Right;   VIDEO ASSISTED THORACOSCOPY (VATS)/ LOBECTOMY Left 12/15/2018  Procedure: VIDEO ASSISTED THORACOSCOPY (VATS)/LEFT UPPER LOBECTOMY;  Surgeon: Melrose Nakayama, MD;  Location: [facility];  Service: Thoracic;  Laterality: Left;    SOCIAL HISTORY:  Social History   Socioeconomic History   Marital status: Married    Spouse name: Not on file   Number of children: 3   Years of education: Not on file   Highest education level: Not on file  Occupational History   Occupation: Database administrator    Comment: Engineering geologist, retired    Comment: [place]: [place]  Tobacco Use   Smoking status: Former     Packs/day: 0.50    Years: 43.00    Total pack years: 21.50    Types: Cigarettes    Quit date: 10/24/2018    Years since quitting: 4.0   Smokeless tobacco: Never  Vaping Use   Vaping Use: Never used  Substance and Sexual Activity   Alcohol use: Never   Drug use: No   Sexual activity: Not Currently    Birth control/protection: Surgical    Comment: hyst  Other Topics Concern   Not on file  Social History Narrative   Lives with husband   Caffeine-- coffee 2 c daily   Social Determinants of Health   Financial Resource Strain: Low Risk  (11/24/2018)   Overall Financial Resource Strain (CARDIA)    Difficulty of Paying Living Expenses: Not hard at all  Food Insecurity: No Food Insecurity (10/18/2022)   Hunger Vital Sign    Worried About Running Out of Food in the Last Year: Never true    [place] in the Last Year: Never true  Transportation Needs: No Transportation Needs (10/18/2022)   PRAPARE - Hydrologist (Medical): No    Lack of Transportation (Non-Medical): No  Physical Activity: Inactive (11/24/2018)   Exercise Vital Sign    Days of Exercise per Week: 0 days    Minutes of Exercise per Session: 0 min  Stress: Stress Concern Present (11/24/2018)   [place]    Feeling of Stress : Rather much  Social Connections: Socially Integrated (11/24/2018)   Social Connection and Isolation Panel [NHANES]    Frequency of Communication with Friends and Family: More than three times a week    Frequency of Social Gatherings with Friends and Family: More than three times a week    Attends Religious Services: More than 4 times per year    Active Member of [employer] or Organizations: Yes    Attends Music therapist: More than 4 times per year    Marital Status: Married  Human resources officer Violence: Not At Risk (10/18/2022)   Humiliation, Afraid, Rape, and Kick questionnaire    Fear  of Current or Ex-Partner: No    Emotionally Abused: No    Physically Abused: No    Sexually Abused: No    FAMILY HISTORY:  Family History  Problem Relation Age of Onset   Lung cancer Mother 70       non smoker   Rheum arthritis Mother    Pancreatic cancer Father 65       d. 32   Leukemia Sister        dx in her 40s   Heart disease Sister    Colon cancer Sister        dx and died in her 62s   Heart disease Sister    Lung cancer Maternal  Aunt        non-smoker   Heart disease Sister    Breast cancer Sister        dx early 39s   Heart disease Brother    Cirrhosis Brother        d. 47   Aneurysm Brother        d. 67s   Fibromyalgia Daughter    Kidney cancer Nephew 71   Kidney cancer Nephew        dx in 38s; great nephew   Leukemia Nephew    Liver disease Nephew    Other Niece        white matter on brain; dx late 73s   Leukemia Nephew     CURRENT MEDICATIONS:  Current Outpatient Medications  Medication Sig Dispense Refill   albuterol (PROVENTIL HFA;VENTOLIN HFA) 108 (90 BASE) MCG/ACT inhaler Inhale 2 puffs into the lungs 3 (three) times daily. Also use the inhaler every 2 hours if needed for worsening shortness of breath and wheezing. 1 Inhaler 3   albuterol (PROVENTIL) (2.5 MG/3ML) 0.083% nebulizer solution Take 3 mLs (2.5 mg total) by nebulization every 2 (two) hours as needed for shortness of breath. 75 mL 12   aspirin EC 81 MG tablet Take 1 tablet (81 mg total) by mouth daily. Swallow whole. 90 tablet 3   budesonide (PULMICORT) 0.25 MG/2ML nebulizer solution Take 2 mLs (0.25 mg total) by nebulization 2 (two) times daily. 60 mL 2   Calcium 600-200 MG-UNIT per tablet Take 1 tablet by mouth daily.     clonazePAM (KLONOPIN) 0.5 MG tablet Take 0.5 mg by mouth at bedtime.     dextromethorphan-guaiFENesin (MUCINEX DM) 30-600 MG 12hr tablet Take 1 tablet by mouth 2 (two) times daily. 30 tablet 0   FLUoxetine (PROZAC) 40 MG capsule Take 40 mg by mouth daily.     furosemide  (LASIX) 20 MG tablet Take 20 mg by mouth as needed.     gabapentin (NEURONTIN) 300 MG capsule Take 300 mg by mouth 2 (two) times daily.     lisinopril (ZESTRIL) 40 MG tablet Take 40 mg by mouth daily.     meloxicam (MOBIC) 7.5 MG tablet Take 1 tablet (7.5 mg total) by mouth daily as needed for pain.     metoprolol succinate (TOPROL-XL) 50 MG 24 hr tablet Take 50 mg by mouth daily.     olmesartan (BENICAR) 40 MG tablet One daily 30 tablet 11   omeprazole (PRILOSEC) 40 MG capsule Take 40 mg by mouth daily.      predniSONE (DELTASONE) 10 MG tablet Take 77m po daily for 2 days then 371mdaily for 2 days then 2034maily for 2 days then 17m78mily for 2 days then stop 20 tablet 0   rosuvastatin (CRESTOR) 10 MG tablet Take 10 mg by mouth daily.     Tiotropium Bromide-Olodaterol 2.5-2.5 MCG/ACT AERS Inhale 2 puffs into the lungs daily.     tizanidine (ZANAFLEX) 2 MG capsule Take 1 capsule (2 mg total) by mouth daily as needed for muscle spasms.     nitroGLYCERIN (NITROSTAT) 0.4 MG SL tablet Place 1 tablet (0.4 mg total) under the tongue every 5 (five) minutes as needed for chest pain. (Patient not taking: Reported on 05/02/2022) 25 tablet 3   No current facility-administered medications for this visit.    ALLERGIES:  No Known Allergies  PHYSICAL EXAM:  Performance status (ECOG): 1 - Symptomatic but completely ambulatory  Vitals:   11/07/22 1452  BP: (!) 124/45  Pulse: 77  Resp: 18  Temp: 98.6 F (37 C)  SpO2: 98%   Wt Readings from Last 3 Encounters:  11/07/22 202 lb 6.4 oz (91.8 kg)  10/25/22 198 lb 6.4 oz (90 kg)  10/20/22 199 lb 11.8 oz (90.6 kg)   Physical Exam Vitals reviewed.  Constitutional:      Appearance: Normal appearance. She is obese.  Cardiovascular:     Rate and Rhythm: Normal rate and regular rhythm.     Pulses: Normal pulses.     Heart sounds: Normal heart sounds.  Pulmonary:     Effort: Pulmonary effort is normal.     Breath sounds: Normal breath sounds.   Neurological:     General: No focal deficit present.     Mental Status: She is alert and oriented to person, place, and time.  Psychiatric:        Mood and Affect: Mood normal.        Behavior: Behavior normal.      LABORATORY DATA:  I have reviewed the labs as listed.     Latest Ref Rng & Units 11/01/2022    1:39 PM 10/19/2022    4:09 AM 10/18/2022    4:16 PM  CBC  WBC 4.0 - 10.5 K/uL 12.6  16.1  14.0   Hemoglobin 12.0 - 15.0 g/dL 12.5  11.4  12.0   Hematocrit 36.0 - 46.0 % 41.2  36.6  38.6   Platelets 150 - 400 K/uL 322  364  382       Latest Ref Rng & Units 11/01/2022    1:39 PM 10/19/2022    4:09 AM 10/18/2022    3:52 PM  CMP  Glucose 70 - 99 mg/dL 120  149  159   BUN 8 - 23 mg/dL _0 Creatinine 0.44 - 1.00 mg/dL 0.65  0.65  0.72   Sodium 135 - 145 mmol/L 138  138  138   Potassium 3.5 - 5.1 mmol/L 4.1  4.4  4.0   Chloride 98 - 111 mmol/L 103  100  103   CO2 22 - 32 mmol/L _1 Calcium 8.9 - 10.3 mg/dL 9.4  9.5  9.3   Total Protein 6.5 - 8.1 g/dL 7.1  6.6  8.0   Total Bilirubin 0.3 - 1.2 mg/dL 1.0  0.5  0.7   Alkaline Phos 38 - 126 U/L 75  77  101   AST 15 - 41 U/L 22  28  35   ALT 0 - 44 U/L _2 DIAGNOSTIC IMAGING:  I have independently reviewed the scans and discussed with the patient. CT Chest Wo Contrast  Result Date: 11/03/2022 CLINICAL DATA:  Small cell lung cancer.  * Tracking Code: BO * EXAM: CT CHEST WITHOUT CONTRAST TECHNIQUE: Multidetector CT imaging of the chest was performed following the standard protocol without IV contrast. RADIATION DOSE REDUCTION: This exam was performed according to the departmental dose-optimization program which includes automated exposure control, adjustment of the mA and/or kV according to patient size and/or use of iterative reconstruction technique. COMPARISON:  CT scan 04/25/2022 FINDINGS: Cardiovascular: The heart is normal in size. No pericardial effusion. Stable tortuosity and calcification of  the thoracic aorta. Stable branch vessel calcifications including three-vessel coronary artery calcifications. Mediastinum/Nodes: No mediastinal or hilar mass or lymphadenopathy. The esophagus is grossly normal. Lungs/Pleura: Stable surgical changes from a left upper  lobe lobectomy. No findings suspicious for recurrent tumor. No worrisome pulmonary lesions. Stable 3 mm perifissural nodule in the right upper lobe consistent with a benign finding. No pleural effusions. There is patchy linear airspace opacity in the right lower lobe some of which is chronic scarring change and other areas are probably areas of atelectasis. No pleural effusions or pleural lesions. Upper Abdomen: No significant upper abdominal findings. Stable vascular calcifications. Musculoskeletal: No breast masses are identified. Stable linear band of calcification in the right breast. No supraclavicular or axillary adenopathy. The right IJ power port is stable. The bony thorax is intact. Stable exaggerated thoracic kyphosis and degenerative changes. IMPRESSION: 1. Stable surgical changes from a left upper lobe lobectomy. No findings suspicious for recurrent tumor, mediastinal/hilar adenopathy or pulmonary metastatic disease. 2. Stable 3 mm perifissural nodule in the right upper lobe consistent with a benign finding. 3. Stable atherosclerotic calcifications involving the aorta and branch vessels including three-vessel coronary artery calcifications. 4. Patchy right lower lobe atelectasis and scarring. Aortic Atherosclerosis (ICD10-I70.0) and Emphysema (ICD10-J43.9). Electronically Signed   By: Marijo Sanes M.D.   On: 11/03/2022 13:39   DG Chest Portable 1 View  Result Date: 10/18/2022 CLINICAL DATA:  Dyspnea EXAM: PORTABLE CHEST 1 VIEW COMPARISON:  12/15/2020 FINDINGS: The heart size and mediastinal contours are within normal limits. Both lungs are clear. No pneumothorax or pleural effusion. Aorta is calcified. There are thoracic degenerative  changes. Right-sided Port-A-Cath tip overlies mid SVC. IMPRESSION: No active disease. Electronically Signed   By: Sammie Bench M.D.   On: 10/18/2022 16:35     ASSESSMENT:  1.  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  She was found to have an abnormal chest x-ray followed by CT scan which showed left upper lobe lung mass. - She smoked 1 to 2 packs of cigarettes per day for the last 50 years.  She quit smoking on 10/23/2018. - Denies any significant weight loss.  But has night sweats occasionally.  Denies any hemoptysis. - PET CT scan on 11/17/2018 showed left upper lobe lung mass, 4.9 cm, no evidence of adenopathy or metastasis.  MRI of the brain was negative for intracranial mets. - Left upper lobectomy and lymph node biopsy on 12/15/2018.  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   -Cancer type ID testing shows 96% probability of neuroendocrine carcinoma (small cell/large cell carcinoma). -4 cycles of adjuvant chemotherapy with carboplatin and etoposide from 01/20/2019 through 03/23/2019. -CT of the chest on 08/25/2019 showed left upper lobectomy changes with tiny pulmonary nodules unchanged. -CT chest with contrast on 01/06/2021 with stable surgical changes in the left upper lobectomy.  New 22 mm right middle lobe density, likely an area of infection/inflammation.  No adenopathy.   2.  Family history: -Mother had lung cancer and was never smoker.  Father had pancreatic cancer. -1 sister has colon cancer in 1 sister with leukemia.  Another sister had breast cancer. -[facility] testing on 12/09/2018 was negative for hereditary malignancies.   PLAN:  1.  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 - CT chest (11/01/2022): Stable surgical  changes in the left upper lobectomy.  Stable 3 mm perifissural nodule in the right upper lobe.  Stable calcifications. - Reviewed labs which showed elevated white count  12.6, likely from Pulmicort nebulizer.  Hemoglobin and platelets are normal.  LFTs are normal. - Recommend follow-up in 6 months with repeat scan and labs.   Orders placed this encounter:  Orders Placed This Encounter  Procedures   CT Chest Wo Contrast   CBC with Differential   Comprehensive metabolic panel      Derek Jack, MD [place] [phone]

## 2022-11-07 NOTE — Patient Instructions (Signed)
[facility]  Discharge Instructions: Thank you for choosing [facility] to provide your oncology and hematology care.  If you have a lab appointment with the [facility], please come in thru the Main Entrance and check in at the main information desk.  Wear comfortable clothing and clothing appropriate for easy access to any Portacath or PICC line.   We strive to give you quality time with your provider. You may need to reschedule your appointment if you arrive late (15 or more minutes).  Arriving late affects you and other patients whose appointments are after yours.  Also, if you miss three or more appointments without notifying the office, you may be dismissed from the clinic at the provider's discretion.      For prescription refill requests, have your pharmacy contact our office and allow 72 hours for refills to be completed.    Today you received the following port flush, return as scheduled.  To help prevent nausea and vomiting after your treatment, we encourage you to take your nausea medication as directed.  BELOW ARE SYMPTOMS THAT SHOULD BE REPORTED IMMEDIATELY: *FEVER GREATER THAN 100.4 F (38 C) OR HIGHER *CHILLS OR SWEATING *NAUSEA AND VOMITING THAT IS NOT CONTROLLED WITH YOUR NAUSEA MEDICATION *UNUSUAL SHORTNESS OF BREATH *UNUSUAL BRUISING OR BLEEDING *URINARY PROBLEMS (pain or burning when urinating, or frequent urination) *BOWEL PROBLEMS (unusual diarrhea, constipation, pain near the anus) TENDERNESS IN MOUTH AND THROAT WITH OR WITHOUT PRESENCE OF ULCERS (sore throat, sores in mouth, or a toothache) UNUSUAL RASH, SWELLING OR PAIN  UNUSUAL VAGINAL DISCHARGE OR ITCHING   Items with * indicate a potential emergency and should be followed up as soon as possible or go to the Emergency Department if any problems should occur.  Please show the CHEMOTHERAPY ALERT CARD or IMMUNOTHERAPY ALERT CARD at check-in to the Emergency Department and  triage nurse.  Should you have questions after your visit or need to cancel or reschedule your appointment, please contact [facility] [phone]  and follow the prompts.  Office hours are 8:00 a.m. to 4:30 p.m. Monday - Friday. Please note that voicemails left after 4:00 p.m. may not be returned until the following business day.  We are closed weekends and major holidays. You have access to a nurse at all times for urgent questions. Please call the main number to the clinic [phone] and follow the prompts.  For any non-urgent questions, you may also contact your provider using MyChart. We now offer e-Visits for anyone 62 and older to request care online for non-urgent symptoms. For details visit [email or web address].   Also download the MyChart app! Go to the app store, search "MyChart", open the app, select [facility], and log in with your MyChart username and password.  Masks are optional in the cancer centers. If you would like for your care team to wear a mask while they are taking care of you, please let them know. You may have one support person who is at least 75 years old accompany you for your appointments.

## 2022-11-19 DIAGNOSIS — Z6836 Body mass index (BMI) 36.0-36.9, adult: Secondary | ICD-10-CM | POA: Diagnosis not present

## 2022-11-19 DIAGNOSIS — J441 Chronic obstructive pulmonary disease with (acute) exacerbation: Secondary | ICD-10-CM | POA: Diagnosis not present

## 2022-12-03 DIAGNOSIS — M810 Age-related osteoporosis without current pathological fracture: Secondary | ICD-10-CM | POA: Diagnosis not present

## 2022-12-03 DIAGNOSIS — G8929 Other chronic pain: Secondary | ICD-10-CM | POA: Diagnosis not present

## 2022-12-03 DIAGNOSIS — Z85118 Personal history of other malignant neoplasm of bronchus and lung: Secondary | ICD-10-CM | POA: Diagnosis not present

## 2022-12-03 DIAGNOSIS — F339 Major depressive disorder, recurrent, unspecified: Secondary | ICD-10-CM | POA: Diagnosis not present

## 2022-12-03 DIAGNOSIS — E7801 Familial hypercholesterolemia: Secondary | ICD-10-CM | POA: Diagnosis not present

## 2022-12-03 DIAGNOSIS — M549 Dorsalgia, unspecified: Secondary | ICD-10-CM | POA: Diagnosis not present

## 2022-12-03 DIAGNOSIS — I10 Essential (primary) hypertension: Secondary | ICD-10-CM | POA: Diagnosis not present

## 2022-12-03 DIAGNOSIS — J440 Chronic obstructive pulmonary disease with acute lower respiratory infection: Secondary | ICD-10-CM | POA: Diagnosis not present

## 2022-12-03 DIAGNOSIS — M542 Cervicalgia: Secondary | ICD-10-CM | POA: Diagnosis not present

## 2022-12-09 NOTE — Progress Notes (Unsigned)
Laura Abbott, female    DOB: 1946/10/22    MRN: [id number]   Brief patient profile:  22  yowf  quit smoking 09/2018  at wt around 200   referred to pulmonary clinic in [facility]  10/25/2022 by Dr Roderic Palau from [facility]  for copd eval s/p admit  Admit date: 10/18/2022 Discharge date: 10/20/2022 Home Health: Equipment/Devices: Nebulizer machine   Discharge Condition: Improved CODE STATUS: Full code Diet recommendation: Heart healthy   Brief/Interim Summary: 77 year old female with a history of COPD, lung cancer, hyperlipidemia, 3 d prior treated for bronchitis with antibiotics and prednisone, had persistent shortness of breath and wheezing and was admitted to the hospital with COPD exacerbation.  Receiving IV steroids and bronchodilators.    Discharge Diagnoses:  Principal Problem:   COPD exacerbation ([name])   Hypertension   Leukocytosis   Hyperlipidemia   Depression with anxiety   COPD exacerbation -Likely precipitated by recent bronchitis -Completed course of azithromycin prior to admission -Chest x-ray on admission did not show any pneumonia -Started on intravenous steroids -Overall wheezing is improved -She is able to wean off of oxygen and is currently ambulating on room air without hypoxia or shortness of breath -Steroids transition to prednisone taper -She will be continued on nebulizer treatments as needed at home -Outpatient referral to pulmonology has been made   Depression with anxiety -Continue Prozac and Klonopin   Hyperlipidemia -Continue statin   Hypertension -Continue lisinopril metoprolol     History of Present Illness  10/25/2022  Pulmonary/ 1st office eval/ Laura Abbott / [facility]  Dyspnea:  [employer] is a challenge/ in and out ok but can't do the whole store Cough:  assoc with choking sensation daytime  only, no excess mucus  Sleep: bed is flat one pillow s resp cc  SABA use: neb this am only  02: none Covid vax x 3  No obvious day to day or daytime  pattern/variability or assoc excess/ purulent sputum or mucus plugs or hemoptysis or cp or chest tightness, subjective wheeze or overt sinus or hb symptoms Rec Plan A = Automatic = Always=    Stiolto  2 puffs each am and budesonide (pulmicort) twice daily in the nebulizer  Work on inhaler technique:  Plan B = Backup (to supplement plan A, not to replace it) Only use your albuterol inhaler as a rescue medication  Plan C = Crisis (instead of Plan B but only if Plan B stops working) - only use your albuterol nebulizer if you first try Plan B  Stop lisinopril and start olmesartan 40 mg one daily in its place      12/10/2022  f/u ov/[facility]/Laura Abbott re: COPD 0/ ? Acei case   maint on ***  No chief complaint on file.   Dyspnea:  *** Cough: *** Sleeping: *** SABA use: *** 02: *** Covid status: *** Lung cancer screening: ***   No obvious day to day or daytime variability or assoc excess/ purulent sputum or mucus plugs or hemoptysis or cp or chest tightness, subjective wheeze or overt sinus or hb symptoms.   *** without nocturnal  or early am exacerbation  of respiratory  c/o's or need for noct saba. Also denies any obvious fluctuation of symptoms with weather or environmental changes or other aggravating or alleviating factors except as outlined above   No unusual exposure hx or h/o childhood pna/ asthma or knowledge of premature birth.  Current Allergies, Complete Past Medical History, Past Surgical History, Family History, and Social History  were reviewed in [facility] electronic medical record.  ROS  The following are not active complaints unless bolded Hoarseness, sore throat, dysphagia, dental problems, itching, sneezing,  nasal congestion or discharge of excess mucus or purulent secretions, ear ache,   fever, chills, sweats, unintended wt loss or wt gain, classically pleuritic or exertional cp,  orthopnea pnd or arm/hand swelling  or leg swelling, presyncope, palpitations,  abdominal pain, anorexia, nausea, vomiting, diarrhea  or change in bowel habits or change in bladder habits, change in stools or change in urine, dysuria, hematuria,  rash, arthralgias, visual complaints, headache, numbness, weakness or ataxia or problems with walking or coordination,  change in mood or  memory.        No outpatient medications have been marked as taking for the 12/10/22 encounter (Appointment) with Laura Cowden, MD.            Past Medical History:  Diagnosis Date   Anxiety    Arthritis    Asthma    Breast cancer (HCC)    breast, right DCIS   COPD (chronic obstructive pulmonary disease) (HCC)    COPD exacerbation (HCC) 01/03/2014   With hypoxia   Depression    Emphysema of lung (HCC)    Family history of breast cancer    Family history of colon cancer    Family history of kidney cancer    Family history of lung cancer    Family history of pancreatic cancer    GERD (gastroesophageal reflux disease)    History of kidney stones    Hyperlipidemia    Hypertension    Insomnia    Lung cancer (HCC)    Obesity 01/05/2014   Osteoporosis    Osteoporosis, unspecified 12/01/2013   Personal history of radiation therapy    rt breast   Pneumonia    x2   Tobacco abuse 12/01/2013   Ulnar nerve impingement, left        Objective:    Wt Readings from Last 3 Encounters:  11/07/22 202 lb 6.4 oz (91.8 kg)  10/25/22 198 lb 6.4 oz (90 kg)  10/20/22 199 lb 11.8 oz (90.6 kg)      Vital signs reviewed  12/10/2022  - Note at rest 02 sats  ***% on ***   General appearance:    ***      Min bar ***     Assessment

## 2022-12-10 ENCOUNTER — Encounter: Payer: Self-pay | Admitting: Internal Medicine

## 2022-12-10 ENCOUNTER — Ambulatory Visit: Payer: Medicare HMO | Admitting: Internal Medicine

## 2022-12-10 VITALS — BP 138/92 | HR 74 | Temp 98.4°F | Ht 63.5 in | Wt 203.2 lb

## 2022-12-10 DIAGNOSIS — I10 Essential (primary) hypertension: Secondary | ICD-10-CM | POA: Diagnosis not present

## 2022-12-10 DIAGNOSIS — J449 Chronic obstructive pulmonary disease, unspecified: Secondary | ICD-10-CM | POA: Diagnosis not present

## 2022-12-10 MED ORDER — HYDROCHLOROTHIAZIDE 12.5 MG PO TABS: 12.5000 mg | ORAL_TABLET | Freq: Every day | ORAL | 11 refills | Status: AC

## 2022-12-10 NOTE — Patient Instructions (Signed)
Add hctz 12.5 mg daily   No other changes in recommendations   If you are satisfied with your treatment plan,  let your doctor know and he/she can either refill your medications or you can return here when your prescription runs out.     If in any way you are not 100% satisfied,  please tell us.  If 100% better, tell your friends!  Pulmonary follow up is as needed

## 2022-12-10 NOTE — Assessment & Plan Note (Signed)
Changed acei to arb 10/25/2022 due to choking sensation  > resolved 12/10/2022   Although even in retrospect it may not be clear the ACEi contributed to the pt's symptoms,  Pt improved off them and adding them back at this point or in the future would risk confusion in interpretation of non-specific respiratory symptoms to which this patient is prone  ie  Better not to muddy the waters here.   >>> continue benicar 40 mg daily and add hctz 12.5 mg daily due to tendency to leg swelling previously req lasix > f/u with pcp on both issues          Each maintenance medication was reviewed in detail including emphasizing most importantly the difference between maintenance and prns and under what circumstances the prns are to be triggered using an action plan format where appropriate.  Total time for H and P, chart review, counseling,  and generating customized AVS unique to this office visit / same day charting = 22 min final f/u ov

## 2022-12-10 NOTE — Assessment & Plan Note (Signed)
Stopped smoking 2019 - PFT's  12/10/18  FEV1 1.40 (68 % ) ratio 0.72  p 3 % improvement from saba p Breo prior to study with DLCO  12.12 (65%)   and FV curve min concave and ERV 20 at wt 203    - 10/25/2022  After extensive coaching inhaler device,  effectiveness =    75% smi (short ti)  - try off acei 10/25/2022 > much improved 12/10/2022   Pt is Group B in terms of symptom/risk and laba/lama therefore appropriate rx at this point >>>  continue stiolto/ refills per PCP if doing great and here prn

## 2023-01-24 DIAGNOSIS — I10 Essential (primary) hypertension: Secondary | ICD-10-CM | POA: Diagnosis not present

## 2023-01-24 DIAGNOSIS — K219 Gastro-esophageal reflux disease without esophagitis: Secondary | ICD-10-CM | POA: Diagnosis not present

## 2023-01-24 DIAGNOSIS — M549 Dorsalgia, unspecified: Secondary | ICD-10-CM | POA: Diagnosis not present

## 2023-01-24 DIAGNOSIS — J449 Chronic obstructive pulmonary disease, unspecified: Secondary | ICD-10-CM | POA: Diagnosis not present

## 2023-02-07 ENCOUNTER — Inpatient Hospital Stay: Payer: Medicare HMO | Attending: Hematology

## 2023-02-07 VITALS — BP 151/50 | HR 69 | Temp 97.9°F | Resp 16

## 2023-02-07 DIAGNOSIS — Z95828 Presence of other vascular implants and grafts: Secondary | ICD-10-CM

## 2023-02-07 DIAGNOSIS — Z452 Encounter for adjustment and management of vascular access device: Secondary | ICD-10-CM | POA: Diagnosis not present

## 2023-02-07 DIAGNOSIS — C7A8 Other malignant neuroendocrine tumors: Secondary | ICD-10-CM | POA: Insufficient documentation

## 2023-02-07 MED ORDER — SODIUM CHLORIDE 0.9% FLUSH
10.0000 mL | Freq: Once | INTRAVENOUS | Status: AC
  Administered 2023-02-07: 10 mL via INTRAVENOUS

## 2023-02-07 MED ORDER — HEPARIN SOD (PORK) LOCK FLUSH 100 UNIT/ML IV SOLN
500.0000 [IU] | Freq: Once | INTRAVENOUS | Status: AC
  Administered 2023-02-07: 500 [IU] via INTRAVENOUS

## 2023-02-07 NOTE — Patient Instructions (Signed)
[facility]  Discharge Instructions: Thank you for choosing [facility] to provide your oncology and hematology care.  If you have a lab appointment with the [facility], please come in thru the Main Entrance and check in at the main information desk.  Wear comfortable clothing and clothing appropriate for easy access to any Portacath or PICC line.   We strive to give you quality time with your provider. You may need to reschedule your appointment if you arrive late (15 or more minutes).  Arriving late affects you and other patients whose appointments are after yours.  Also, if you miss three or more appointments without notifying the office, you may be dismissed from the clinic at the provider's discretion.      For prescription refill requests, have your pharmacy contact our office and allow 72 hours for refills to be completed.    Today you received the following port flush.   To help prevent nausea and vomiting after your treatment, we encourage you to take your nausea medication as directed.  BELOW ARE SYMPTOMS THAT SHOULD BE REPORTED IMMEDIATELY: *FEVER GREATER THAN 100.4 F (38 C) OR HIGHER *CHILLS OR SWEATING *NAUSEA AND VOMITING THAT IS NOT CONTROLLED WITH YOUR NAUSEA MEDICATION *UNUSUAL SHORTNESS OF BREATH *UNUSUAL BRUISING OR BLEEDING *URINARY PROBLEMS (pain or burning when urinating, or frequent urination) *BOWEL PROBLEMS (unusual diarrhea, constipation, pain near the anus) TENDERNESS IN MOUTH AND THROAT WITH OR WITHOUT PRESENCE OF ULCERS (sore throat, sores in mouth, or a toothache) UNUSUAL RASH, SWELLING OR PAIN  UNUSUAL VAGINAL DISCHARGE OR ITCHING   Items with * indicate a potential emergency and should be followed up as soon as possible or go to the Emergency Department if any problems should occur.  Please show the CHEMOTHERAPY ALERT CARD or IMMUNOTHERAPY ALERT CARD at check-in to the Emergency Department and triage nurse.  Should  you have questions after your visit or need to cancel or reschedule your appointment, please contact [facility] [phone]  and follow the prompts.  Office hours are 8:00 a.m. to 4:30 p.m. Monday - Friday. Please note that voicemails left after 4:00 p.m. may not be returned until the following business day.  We are closed weekends and major holidays. You have access to a nurse at all times for urgent questions. Please call the main number to the clinic [phone] and follow the prompts.  For any non-urgent questions, you may also contact your provider using MyChart. We now offer e-Visits for anyone 66 and older to request care online for non-urgent symptoms. For details visit [email or web address].   Also download the MyChart app! Go to the app store, search "MyChart", open the app, select [facility], and log in with your MyChart username and password.

## 2023-02-07 NOTE — Progress Notes (Signed)
Patients port flushed without difficulty.  Good blood return noted with no bruising or swelling noted at site.  Band aid applied.  VSS with discharge and left in satisfactory condition with no s/s of distress noted.   

## 2023-02-19 ENCOUNTER — Other Ambulatory Visit (HOSPITAL_COMMUNITY): Payer: Self-pay | Admitting: Internal Medicine

## 2023-02-19 DIAGNOSIS — Z1231 Encounter for screening mammogram for malignant neoplasm of breast: Secondary | ICD-10-CM

## 2023-03-04 ENCOUNTER — Ambulatory Visit (HOSPITAL_COMMUNITY)
Admission: RE | Admit: 2023-03-04 | Discharge: 2023-03-04 | Disposition: A | Discharge status: Home / Self Care | Payer: Medicare HMO | Source: Ambulatory Visit | Attending: Internal Medicine | Admitting: Internal Medicine

## 2023-03-04 DIAGNOSIS — Z1231 Encounter for screening mammogram for malignant neoplasm of breast: Secondary | ICD-10-CM | POA: Diagnosis not present

## 2023-03-05 DIAGNOSIS — Z131 Encounter for screening for diabetes mellitus: Secondary | ICD-10-CM | POA: Diagnosis not present

## 2023-03-05 DIAGNOSIS — E559 Vitamin D deficiency, unspecified: Secondary | ICD-10-CM | POA: Diagnosis not present

## 2023-03-06 DIAGNOSIS — M810 Age-related osteoporosis without current pathological fracture: Secondary | ICD-10-CM | POA: Diagnosis not present

## 2023-03-06 DIAGNOSIS — Z85118 Personal history of other malignant neoplasm of bronchus and lung: Secondary | ICD-10-CM | POA: Diagnosis not present

## 2023-03-06 DIAGNOSIS — E7801 Familial hypercholesterolemia: Secondary | ICD-10-CM | POA: Diagnosis not present

## 2023-03-06 DIAGNOSIS — M549 Dorsalgia, unspecified: Secondary | ICD-10-CM | POA: Diagnosis not present

## 2023-03-06 DIAGNOSIS — I10 Essential (primary) hypertension: Secondary | ICD-10-CM | POA: Diagnosis not present

## 2023-03-06 DIAGNOSIS — M542 Cervicalgia: Secondary | ICD-10-CM | POA: Diagnosis not present

## 2023-03-06 DIAGNOSIS — H60549 Acute eczematoid otitis externa, unspecified ear: Secondary | ICD-10-CM | POA: Diagnosis not present

## 2023-03-06 DIAGNOSIS — F339 Major depressive disorder, recurrent, unspecified: Secondary | ICD-10-CM | POA: Diagnosis not present

## 2023-03-06 DIAGNOSIS — G8929 Other chronic pain: Secondary | ICD-10-CM | POA: Diagnosis not present

## 2023-03-06 DIAGNOSIS — J440 Chronic obstructive pulmonary disease with acute lower respiratory infection: Secondary | ICD-10-CM | POA: Diagnosis not present

## 2023-03-11 ENCOUNTER — Other Ambulatory Visit (HOSPITAL_COMMUNITY): Payer: Self-pay | Admitting: Internal Medicine

## 2023-03-11 DIAGNOSIS — R928 Other abnormal and inconclusive findings on diagnostic imaging of breast: Secondary | ICD-10-CM

## 2023-03-20 DIAGNOSIS — N6320 Unspecified lump in the left breast, unspecified quadrant: Secondary | ICD-10-CM | POA: Diagnosis not present

## 2023-03-20 DIAGNOSIS — R928 Other abnormal and inconclusive findings on diagnostic imaging of breast: Secondary | ICD-10-CM | POA: Diagnosis not present

## 2023-03-26 DIAGNOSIS — M791 Myalgia, unspecified site: Secondary | ICD-10-CM | POA: Diagnosis not present

## 2023-03-26 DIAGNOSIS — M542 Cervicalgia: Secondary | ICD-10-CM | POA: Diagnosis not present

## 2023-03-26 DIAGNOSIS — M25512 Pain in left shoulder: Secondary | ICD-10-CM | POA: Diagnosis not present

## 2023-03-27 DIAGNOSIS — N649 Disorder of breast, unspecified: Secondary | ICD-10-CM | POA: Diagnosis not present

## 2023-03-27 DIAGNOSIS — N6322 Unspecified lump in the left breast, upper inner quadrant: Secondary | ICD-10-CM | POA: Diagnosis not present

## 2023-03-28 ENCOUNTER — Ambulatory Visit (HOSPITAL_COMMUNITY): Admission: RE | Admit: 2023-03-28 | Payer: Medicare HMO | Source: Ambulatory Visit

## 2023-03-28 ENCOUNTER — Encounter (HOSPITAL_COMMUNITY): Payer: Self-pay

## 2023-04-30 DIAGNOSIS — M5412 Radiculopathy, cervical region: Secondary | ICD-10-CM | POA: Diagnosis not present

## 2023-04-30 DIAGNOSIS — M47816 Spondylosis without myelopathy or radiculopathy, lumbar region: Secondary | ICD-10-CM | POA: Diagnosis not present

## 2023-05-08 ENCOUNTER — Ambulatory Visit (HOSPITAL_COMMUNITY)
Admission: RE | Admit: 2023-05-08 | Discharge: 2023-05-08 | Disposition: A | Discharge status: Home / Self Care | Payer: Medicare HMO | Source: Ambulatory Visit | Attending: Hematology | Admitting: Hematology

## 2023-05-08 ENCOUNTER — Inpatient Hospital Stay: Payer: Medicare HMO | Attending: Hematology

## 2023-05-08 VITALS — BP 142/56 | HR 73 | Temp 97.3°F | Resp 16

## 2023-05-08 DIAGNOSIS — E785 Hyperlipidemia, unspecified: Secondary | ICD-10-CM | POA: Insufficient documentation

## 2023-05-08 DIAGNOSIS — I10 Essential (primary) hypertension: Secondary | ICD-10-CM | POA: Insufficient documentation

## 2023-05-08 DIAGNOSIS — J439 Emphysema, unspecified: Secondary | ICD-10-CM | POA: Diagnosis not present

## 2023-05-08 DIAGNOSIS — Z9049 Acquired absence of other specified parts of digestive tract: Secondary | ICD-10-CM | POA: Insufficient documentation

## 2023-05-08 DIAGNOSIS — Z806 Family history of leukemia: Secondary | ICD-10-CM | POA: Insufficient documentation

## 2023-05-08 DIAGNOSIS — C3490 Malignant neoplasm of unspecified part of unspecified bronchus or lung: Secondary | ICD-10-CM | POA: Diagnosis not present

## 2023-05-08 DIAGNOSIS — Z853 Personal history of malignant neoplasm of breast: Secondary | ICD-10-CM | POA: Insufficient documentation

## 2023-05-08 DIAGNOSIS — Z79899 Other long term (current) drug therapy: Secondary | ICD-10-CM | POA: Insufficient documentation

## 2023-05-08 DIAGNOSIS — Z801 Family history of malignant neoplasm of trachea, bronchus and lung: Secondary | ICD-10-CM | POA: Insufficient documentation

## 2023-05-08 DIAGNOSIS — Z90710 Acquired absence of both cervix and uterus: Secondary | ICD-10-CM | POA: Insufficient documentation

## 2023-05-08 DIAGNOSIS — D3A090 Benign carcinoid tumor of the bronchus and lung: Secondary | ICD-10-CM | POA: Diagnosis not present

## 2023-05-08 DIAGNOSIS — Z8261 Family history of arthritis: Secondary | ICD-10-CM | POA: Insufficient documentation

## 2023-05-08 DIAGNOSIS — Z95828 Presence of other vascular implants and grafts: Secondary | ICD-10-CM

## 2023-05-08 DIAGNOSIS — Z87891 Personal history of nicotine dependence: Secondary | ICD-10-CM | POA: Insufficient documentation

## 2023-05-08 DIAGNOSIS — K219 Gastro-esophageal reflux disease without esophagitis: Secondary | ICD-10-CM | POA: Insufficient documentation

## 2023-05-08 DIAGNOSIS — Z8249 Family history of ischemic heart disease and other diseases of the circulatory system: Secondary | ICD-10-CM | POA: Insufficient documentation

## 2023-05-08 DIAGNOSIS — C7A8 Other malignant neuroendocrine tumors: Secondary | ICD-10-CM | POA: Insufficient documentation

## 2023-05-08 DIAGNOSIS — Z87442 Personal history of urinary calculi: Secondary | ICD-10-CM | POA: Insufficient documentation

## 2023-05-08 DIAGNOSIS — Z803 Family history of malignant neoplasm of breast: Secondary | ICD-10-CM | POA: Insufficient documentation

## 2023-05-08 DIAGNOSIS — Z800 Family history of malignant neoplasm of digestive organs: Secondary | ICD-10-CM | POA: Insufficient documentation

## 2023-05-08 DIAGNOSIS — Z8379 Family history of other diseases of the digestive system: Secondary | ICD-10-CM | POA: Insufficient documentation

## 2023-05-08 LAB — CBC WITH DIFFERENTIAL/PLATELET
Abs Immature Granulocytes: 0.04 10*3/uL (ref 0.00–0.07)
Basophils Absolute: 0.1 10*3/uL (ref 0.0–0.1)
Basophils Relative: 1 %
Eosinophils Absolute: 0.1 10*3/uL (ref 0.0–0.5)
Eosinophils Relative: 1 %
HCT: 38.4 % (ref 36.0–46.0)
Hemoglobin: 11.9 g/dL — ABNORMAL LOW (ref 12.0–15.0)
Immature Granulocytes: 1 %
Lymphocytes Relative: 27 %
Lymphs Abs: 2.2 10*3/uL (ref 0.7–4.0)
MCH: 28.9 pg (ref 26.0–34.0)
MCHC: 31 g/dL (ref 30.0–36.0)
MCV: 93.2 fL (ref 80.0–100.0)
Monocytes Absolute: 0.5 10*3/uL (ref 0.1–1.0)
Monocytes Relative: 6 %
Neutro Abs: 5.5 10*3/uL (ref 1.7–7.7)
Neutrophils Relative %: 64 %
Platelets: 340 10*3/uL (ref 150–400)
RBC: 4.12 MIL/uL (ref 3.87–5.11)
RDW: 15.1 % (ref 11.5–15.5)
WBC: 8.4 10*3/uL (ref 4.0–10.5)
nRBC: 0 % (ref 0.0–0.2)

## 2023-05-08 LAB — COMPREHENSIVE METABOLIC PANEL
ALT: 20 U/L (ref 0–44)
AST: 26 U/L (ref 15–41)
Albumin: 3.7 g/dL (ref 3.5–5.0)
Alkaline Phosphatase: 71 U/L (ref 38–126)
Anion gap: 9 (ref 5–15)
BUN: 19 mg/dL (ref 8–23)
CO2: 25 mmol/L (ref 22–32)
Calcium: 8.9 mg/dL (ref 8.9–10.3)
Chloride: 102 mmol/L (ref 98–111)
Creatinine, Ser: 0.67 mg/dL (ref 0.44–1.00)
GFR, Estimated: >60 mL/min (ref >60)
Glucose, Bld: 153 mg/dL — ABNORMAL HIGH (ref 70–99)
Potassium: 3.6 mmol/L (ref 3.5–5.1)
Sodium: 136 mmol/L (ref 135–145)
Total Bilirubin: 0.6 mg/dL (ref 0.3–1.2)
Total Protein: 7.3 g/dL (ref 6.5–8.1)

## 2023-05-08 MED ORDER — SODIUM CHLORIDE 0.9% FLUSH
10.0000 mL | Freq: Once | INTRAVENOUS | Status: AC
  Administered 2023-05-08: 10 mL via INTRAVENOUS

## 2023-05-08 MED ORDER — HEPARIN SOD (PORK) LOCK FLUSH 100 UNIT/ML IV SOLN
INTRAVENOUS | Status: AC
  Filled 2023-05-08: qty 5

## 2023-05-08 NOTE — Progress Notes (Signed)
Port flushed with good blood return noted. No bruising or swelling at site. Patient discharged in satisfactory condition. VVS stable with no signs or symptoms of distressed noted. 

## 2023-05-09 ENCOUNTER — Other Ambulatory Visit: Payer: Medicare HMO

## 2023-05-14 NOTE — Progress Notes (Signed)
[facility] [address] [place] [address]    Clinic Day:  05/15/2023  Referring physician: Donetta Potts, MD  Patient Care Team: Donetta Potts, MD as PCP - General (Internal Medicine) [name] [name], MD as PCP - Cardiology (Cardiology) Artis Delay, MD as Consulting Physician (Hematology and Oncology)   ASSESSMENT & PLAN:   Assessment: 1.  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  She was found to have an abnormal chest x-ray followed by CT scan which showed left upper lobe lung mass. - She smoked 1 to 2 packs of cigarettes per day for the last 50 years.  She quit smoking on 10/23/2018. - Denies any significant weight loss.  But has night sweats occasionally.  Denies any hemoptysis. - PET CT scan on 11/17/2018 showed left upper lobe lung mass, 4.9 cm, no evidence of adenopathy or metastasis.  MRI of the brain was negative for intracranial mets. - Left upper lobectomy and lymph node biopsy on 12/15/2018.  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   -Cancer type ID testing shows 96% probability of neuroendocrine carcinoma (small cell/large cell carcinoma). -4 cycles of adjuvant chemotherapy with carboplatin and etoposide from 01/20/2019 through 03/23/2019. -CT of the chest on 08/25/2019 showed left upper lobectomy changes with tiny pulmonary nodules unchanged. -CT chest with contrast on 01/06/2021 with stable surgical changes in the left upper lobectomy.  New 22 mm right middle lobe density, likely an area of infection/inflammation.  No adenopathy.   2.  Family history: -Mother had lung cancer and was never smoker.  Father had pancreatic cancer. -1 sister has colon cancer in 1 sister with leukemia.  Another sister had breast cancer. -[facility] testing on 12/09/2018 was  negative for hereditary malignancies.  Plan:  1.  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  No new onset pains. - CT chest (05/07/2021): Left upper lobectomy findings with no evidence of recurrence or metastatic disease. - Labs from 05/08/2023: Normal LFTs and CBC. - Recent left breast biopsy was benign. - Recommend follow-up in 6 months with repeat CT chest without contrast and labs.   Orders Placed This Encounter  Procedures   CT Chest Wo Contrast    Standing Status:   Future    Standing Expiration Date:   05/14/2024    Order Specific Question:   Preferred imaging location?    Answer:   [facility]      I,Katie Daubenspeck,acting as a scribe for [name], MD.,have documented all relevant documentation on the behalf of [name], MD,as directed by  [name], MD while in the presence of [name], MD.   I, [name] MD, have reviewed the above documentation for accuracy and completeness, and I agree with the above.   [name], MD   6/19/202412:52 PM  CHIEF COMPLAINT:   Diagnosis: left neuroendocrine lung cancer    Cancer Staging  DCIS (ductal carcinoma in situ) of breast Staging form: Breast, AJCC 7th Edition - Clinical stage from 12/01/2013: Stage 0 (Tis (DCIS), N0, cM0) - Signed by Artis Delay, MD on 12/01/2013 - Pathologic: Stage 0 (Tis (DCIS), N0, cM0) - Signed by Artis Delay, MD on 12/01/2013    Prior Therapy: 1.  Left upper lobectomy on 12/15/2018. 2. Carboplatin and etoposide x 4 cycles from 01/20/2019 to 03/23/2019.  Current Therapy:  surveillance    HISTORY OF PRESENT ILLNESS:   Oncology History  DCIS (ductal carcinoma in situ) of breast  10/28/2013 Initial Diagnosis   DCIS (ductal carcinoma in situ) of breast   12/21/2018 Genetic Testing   Negative genetic testing on the multicancer panel.  The Multi-Gene  Panel offered by [facility] includes sequencing and/or deletion duplication testing of the following 84 genes: AIP, ALK, APC, ATM, AXIN2,BAP1,  BARD1, BLM, BMPR1A, BRCA1, BRCA2, BRIP1, CASR, CDC73, CDH1, CDK4, CDKN1B, CDKN1C, CDKN2A (p14ARF), CDKN2A (p16INK4a), CEBPA, CHEK2, CTNNA1, DICER1, DIS3L2, EGFR (c.2369C>T, p.Thr790Met variant only), EPCAM (Deletion/duplication testing only), FH, FLCN, GATA2, GPC3, GREM1 (Promoter region deletion/duplication testing only), HOXB13 (c.251G>A, p.Gly84Glu), HRAS, KIT, MAX, MEN1, MET, MITF (c.952G>A, p.Glu318Lys variant only), MLH1, MSH2, MSH3, MSH6, MUTYH, NBN, NF1, NF2, NTHL1, PALB2, PDGFRA, PHOX2B, PMS2, POLD1, POLE, POT1, PRKAR1A, PTCH1, PTEN, RAD50, RAD51C, RAD51D, RB1, RECQL4, RET, RUNX1, SDHAF2, SDHA (sequence changes only), SDHB, SDHC, SDHD, SMAD4, SMARCA4, SMARCB1, SMARCE1, STK11, SUFU, TERC, TERT, TMEM127, TP53, TSC1, TSC2, VHL, WRN and WT1.  The report date is 12/21/2018.   Lung cancer (HCC)  12/15/2018 Initial Diagnosis   Lung cancer (HCC)   12/21/2018 Genetic Testing   Negative genetic testing on the multicancer panel.  The Multi-Gene Panel offered by [facility] includes sequencing and/or deletion duplication testing of the following 84 genes: AIP, ALK, APC, ATM, AXIN2,BAP1,  BARD1, BLM, BMPR1A, BRCA1, BRCA2, BRIP1, CASR, CDC73, CDH1, CDK4, CDKN1B, CDKN1C, CDKN2A (p14ARF), CDKN2A (p16INK4a), CEBPA, CHEK2, CTNNA1, DICER1, DIS3L2, EGFR (c.2369C>T, p.Thr790Met variant only), EPCAM (Deletion/duplication testing only), FH, FLCN, GATA2, GPC3, GREM1 (Promoter region deletion/duplication testing only), HOXB13 (c.251G>A, p.Gly84Glu), HRAS, KIT, MAX, MEN1, MET, MITF (c.952G>A, p.Glu318Lys variant only), MLH1, MSH2, MSH3, MSH6, MUTYH, NBN, NF1, NF2, NTHL1, PALB2, PDGFRA, PHOX2B, PMS2, POLD1, POLE, POT1, PRKAR1A, PTCH1, PTEN, RAD50, RAD51C, RAD51D, RB1, RECQL4, RET, RUNX1, SDHAF2, SDHA (sequence changes only), SDHB, SDHC, SDHD, SMAD4, SMARCA4, SMARCB1, SMARCE1, STK11, SUFU, TERC,  TERT, TMEM127, TP53, TSC1, TSC2, VHL, WRN and WT1.  The report date is 12/21/2018.   Primary spindle cell carcinoma of lung (HCC)  12/20/2018 Initial Diagnosis   Primary spindle cell carcinoma of lung (HCC)   12/21/2018 Genetic Testing   Negative genetic testing on the multicancer panel.  The Multi-Gene Panel offered by [facility] includes sequencing and/or deletion duplication testing of the following 84 genes: AIP, ALK, APC, ATM, AXIN2,BAP1,  BARD1, BLM, BMPR1A, BRCA1, BRCA2, BRIP1, CASR, CDC73, CDH1, CDK4, CDKN1B, CDKN1C, CDKN2A (p14ARF), CDKN2A (p16INK4a), CEBPA, CHEK2, CTNNA1, DICER1, DIS3L2, EGFR (c.2369C>T, p.Thr790Met variant only), EPCAM (Deletion/duplication testing only), FH, FLCN, GATA2, GPC3, GREM1 (Promoter region deletion/duplication testing only), HOXB13 (c.251G>A, p.Gly84Glu), HRAS, KIT, MAX, MEN1, MET, MITF (c.952G>A, p.Glu318Lys variant only), MLH1, MSH2, MSH3, MSH6, MUTYH, NBN, NF1, NF2, NTHL1, PALB2, PDGFRA, PHOX2B, PMS2, POLD1, POLE, POT1, PRKAR1A, PTCH1, PTEN, RAD50, RAD51C, RAD51D, RB1, RECQL4, RET, RUNX1, SDHAF2, SDHA (sequence changes only), SDHB, SDHC, SDHD, SMAD4, SMARCA4, SMARCB1, SMARCE1, STK11, SUFU, TERC, TERT, TMEM127, TP53, TSC1, TSC2, VHL, WRN and WT1.  The report date is 12/21/2018.   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HCC)  01/12/2019 Initial Diagnosis   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HCC)   01/20/2019 - 03/26/2019 Chemotherapy   Patient is on Treatment Plan : LUNG SMALL CELL Carboplatin D1 / Etoposide D1-3 q21d        INTERVAL HISTORY:   Laikyn is a 77 y.o. female presenting to clinic today for follow up of left neuroendocrine  lung cancer. She was last seen by me on 11/07/22.  Since her last visit, she underwent surveillance chest CT on 05/08/23.   Of note, she was also found to have a suspicious area in the left breast on screening mammogram from 03/04/23. Biopsy on 03/27/23 showed benign fibroepithelial lesion.  Today, she states that she is doing well overall. Her appetite level is  at 70%. Her energy level is at 10%.  PAST MEDICAL HISTORY:   Past Medical History: Past Medical History:  Diagnosis Date   Anxiety    Arthritis    Asthma    Breast cancer (HCC)    breast, right DCIS   COPD (chronic obstructive pulmonary disease) (HCC)    COPD exacerbation (HCC) 01/03/2014   With hypoxia   Depression    Emphysema of lung (HCC)    Family history of breast cancer    Family history of colon cancer    Family history of kidney cancer    Family history of lung cancer    Family history of pancreatic cancer    GERD (gastroesophageal reflux disease)    History of kidney stones    Hyperlipidemia    Hypertension    Insomnia    Lung cancer (HCC)    Obesity 01/05/2014   Osteoporosis    Osteoporosis, unspecified 12/01/2013   Personal history of radiation therapy    rt breast   Pneumonia    x2   Tobacco abuse 12/01/2013   Ulnar nerve impingement, left     Surgical History: Past Surgical History:  Procedure Laterality Date   ABDOMINAL HYSTERECTOMY     BLADDER SURGERY     had tacked   BREAST LUMPECTOMY Right    BREAST LUMPECTOMY WITH NEEDLE LOCALIZATION AND AXILLARY SENTINEL LYMPH NODE BX Right 11/09/2013   Procedure: BREAST LUMPECTOMY WITH NEEDLE LOCALIZATION AND AXILLARY SENTINEL LYMPH NODE BIOPSY;  Surgeon: Shelly Rubenstein, MD;  Location: [facility] OR;  Service: General;  Laterality: Right;   BREAST SURGERY Right    lumpectomy   CATARACT EXTRACTION W/PHACO Right 06/15/2022   Procedure: CATARACT EXTRACTION PHACO AND INTRAOCULAR LENS PLACEMENT (IOC);  Surgeon: Fabio Pierce, MD;  Location: [facility];  Service: Ophthalmology;  Laterality: Right;  CDE 10.73   CATARACT EXTRACTION W/PHACO Left 06/29/2022   Procedure: CATARACT EXTRACTION PHACO AND INTRAOCULAR LENS PLACEMENT (IOC);  Surgeon: Fabio Pierce, MD;  Location: [facility];  Service: Ophthalmology;  Laterality: Left;  CDE: 11.37   CHOLECYSTECTOMY     NODE DISSECTION Left 12/15/2018   Procedure: NODE DISSECTION LEFT LUNG;   Surgeon: Loreli Slot, MD;  Location: [facility] OR;  Service: Thoracic;  Laterality: Left;   PORTACATH PLACEMENT Right 01/19/2019   Procedure: INSERTION PORT-A-CATH;  Surgeon: Franky Macho, MD;  Location: [facility];  Service: General;  Laterality: Right;   VIDEO ASSISTED THORACOSCOPY (VATS)/ LOBECTOMY Left 12/15/2018   Procedure: VIDEO ASSISTED THORACOSCOPY (VATS)/LEFT UPPER LOBECTOMY;  Surgeon: Loreli Slot, MD;  Location: [facility] OR;  Service: Thoracic;  Laterality: Left;    Social History: Social History   Socioeconomic History   Marital status: Married    Spouse name: Not on file   Number of children: 3   Years of education: Not on file   Highest education level: Not on file  Occupational History   Occupation: Engineer, maintenance (IT)    Comment: Oceanographer, retired    Comment: Programmer, applications    Comment: [employer]  Tobacco Use   Smoking status: Former    Packs/day: 0.50  Years: 43.00    Additional pack years: 0.00    Total pack years: 21.50    Types: Cigarettes    Quit date: 10/24/2018    Years since quitting: 4.5   Smokeless tobacco: Never  Vaping Use   Vaping Use: Never used  Substance and Sexual Activity   Alcohol use: Never   Drug use: No   Sexual activity: Not Currently    Birth control/protection: Surgical    Comment: hyst  Other Topics Concern   Not on file  Social History Narrative   Lives with husband   Caffeine-- coffee 2 c daily   Social Determinants of Health   Financial Resource Strain: Low Risk  (11/24/2018)   Overall Financial Resource Strain (CARDIA)    Difficulty of Paying Living Expenses: Not hard at all  Food Insecurity: No Food Insecurity (10/18/2022)   Hunger Vital Sign    Worried About Running Out of Food in the Last Year: Never true    Ran Out of Food in the Last Year: Never true  Transportation Needs: No Transportation Needs (10/18/2022)   PRAPARE - Administrator, Civil Service (Medical): No    Lack of Transportation  (Non-Medical): No  Physical Activity: Inactive (11/24/2018)   Exercise Vital Sign    Days of Exercise per Week: 0 days    Minutes of Exercise per Session: 0 min  Stress: Stress Concern Present (11/24/2018)   [employer] of Occupational Health - Occupational Stress Questionnaire    Feeling of Stress : Rather much  Social Connections: Socially Integrated (11/24/2018)   Social Connection and Isolation Panel [NHANES]    Frequency of Communication with Friends and Family: More than three times a week    Frequency of Social Gatherings with Friends and Family: More than three times a week    Attends Religious Services: More than 4 times per year    Active Member of [employer] or Organizations: Yes    Attends Engineer, structural: More than 4 times per year    Marital Status: Married  Catering manager Violence: Not At Risk (10/18/2022)   Humiliation, Afraid, Rape, and Kick questionnaire    Fear of Current or Ex-Partner: No    Emotionally Abused: No    Physically Abused: No    Sexually Abused: No    Family History: Family History  Problem Relation Age of Onset   Lung cancer Mother 74       non smoker   Rheum arthritis Mother    Pancreatic cancer Father 30       d. 34   Leukemia Sister        dx in her 2s   Heart disease Sister    Colon cancer Sister        dx and died in her 46s   Heart disease Sister    Lung cancer Maternal Aunt        non-smoker   Heart disease Sister    Breast cancer Sister        dx early 88s   Heart disease Brother    Cirrhosis Brother        d. 28   Aneurysm Brother        d. 72s   Fibromyalgia Daughter    Kidney cancer Nephew 58   Kidney cancer Nephew        dx in 30s; great nephew   Leukemia Nephew    Liver disease Nephew    Other Niece  white matter on brain; dx late 40s   Leukemia Nephew     Current Medications:  Current Outpatient Medications:    albuterol (PROVENTIL HFA;VENTOLIN HFA) 108 (90 BASE) MCG/ACT inhaler,  Inhale 2 puffs into the lungs 3 (three) times daily. Also use the inhaler every 2 hours if needed for worsening shortness of breath and wheezing., Disp: 1 Inhaler, Rfl: 3   albuterol (PROVENTIL) (2.5 MG/3ML) 0.083% nebulizer solution, Take 3 mLs (2.5 mg total) by nebulization every 2 (two) hours as needed for shortness of breath., Disp: 75 mL, Rfl: 12   aspirin EC 81 MG tablet, Take 1 tablet (81 mg total) by mouth daily. Swallow whole., Disp: 90 tablet, Rfl: 3   budesonide (PULMICORT) 0.25 MG/2ML nebulizer solution, Take 2 mLs (0.25 mg total) by nebulization 2 (two) times daily., Disp: 60 mL, Rfl: 2   Calcium 600-200 MG-UNIT per tablet, Take 1 tablet by mouth daily., Disp: , Rfl:    clonazePAM (KLONOPIN) 0.5 MG tablet, Take 0.5 mg by mouth at bedtime., Disp: , Rfl:    dextromethorphan-guaiFENesin (MUCINEX DM) 30-600 MG 12hr tablet, Take 1 tablet by mouth 2 (two) times daily., Disp: 30 tablet, Rfl: 0   FLUoxetine (PROZAC) 40 MG capsule, Take 40 mg by mouth daily., Disp: , Rfl:    furosemide (LASIX) 20 MG tablet, Take 20 mg by mouth as needed., Disp: , Rfl:    gabapentin (NEURONTIN) 300 MG capsule, Take 300 mg by mouth 2 (two) times daily., Disp: , Rfl:    hydrochlorothiazide (HYDRODIURIL) 12.5 MG tablet, Take 1 tablet (12.5 mg total) by mouth daily., Disp: 30 tablet, Rfl: 11   meloxicam (MOBIC) 7.5 MG tablet, Take 1 tablet (7.5 mg total) by mouth daily as needed for pain., Disp: , Rfl:    metoprolol succinate (TOPROL-XL) 50 MG 24 hr tablet, Take 50 mg by mouth daily., Disp: , Rfl:    olmesartan (BENICAR) 40 MG tablet, One daily, Disp: 30 tablet, Rfl: 11   omeprazole (PRILOSEC) 40 MG capsule, Take 40 mg by mouth daily. , Disp: , Rfl:    predniSONE (DELTASONE) 10 MG tablet, Take 40mg  po daily for 2 days then 30mg  daily for 2 days then 20mg  daily for 2 days then 10mg  daily for 2 days then stop, Disp: 20 tablet, Rfl: 0   rosuvastatin (CRESTOR) 10 MG tablet, Take 10 mg by mouth daily., Disp: , Rfl:     Tiotropium Bromide-Olodaterol 2.5-2.5 MCG/ACT AERS, Inhale 2 puffs into the lungs daily., Disp: , Rfl:    tizanidine (ZANAFLEX) 2 MG capsule, Take 1 capsule (2 mg total) by mouth daily as needed for muscle spasms., Disp: , Rfl:    nitroGLYCERIN (NITROSTAT) 0.4 MG SL tablet, Place 1 tablet (0.4 mg total) under the tongue every 5 (five) minutes as needed for chest pain. (Patient not taking: Reported on 05/02/2022), Disp: 25 tablet, Rfl: 3   Allergies: No Known Allergies  REVIEW OF SYSTEMS:   Review of Systems  Constitutional:  Negative for chills, fatigue and fever.  HENT:   Negative for lump/mass, mouth sores, nosebleeds, sore throat and trouble swallowing.   Eyes:  Negative for eye problems.  Respiratory:  Positive for cough and shortness of breath.   Cardiovascular:  Negative for chest pain, leg swelling and palpitations.  Gastrointestinal:  Negative for abdominal pain, constipation, diarrhea, nausea and vomiting.  Genitourinary:  Negative for bladder incontinence, difficulty urinating, dysuria, frequency, hematuria and nocturia.   Musculoskeletal:  Negative for arthralgias, back pain, flank pain, myalgias and  neck pain.  Skin:  Negative for itching and rash.  Neurological:  Positive for dizziness and numbness. Negative for headaches.  Hematological:  Does not bruise/bleed easily.  Psychiatric/Behavioral:  Positive for depression. Negative for sleep disturbance and suicidal ideas. The patient is not nervous/anxious.   All other systems reviewed and are negative.    VITALS:   Blood pressure (!) 132/54, pulse 65, temperature 98.4 F (36.9 C), temperature source Oral, resp. rate 19, height 5' 3.5" (1.613 m), weight 202 lb (91.6 kg), SpO2 96 %.  Wt Readings from Last 3 Encounters:  05/15/23 202 lb (91.6 kg)  12/10/22 203 lb 3.2 oz (92.2 kg)  11/07/22 202 lb 6.4 oz (91.8 kg)    Body mass index is 35.22 kg/m.  Performance status (ECOG): 1 - Symptomatic but completely  ambulatory  PHYSICAL EXAM:   Physical Exam Vitals and nursing note reviewed. Exam conducted with a chaperone present.  Constitutional:      Appearance: Normal appearance.  Cardiovascular:     Rate and Rhythm: Normal rate and regular rhythm.     Pulses: Normal pulses.     Heart sounds: Normal heart sounds.  Pulmonary:     Effort: Pulmonary effort is normal.     Breath sounds: Normal breath sounds.  Abdominal:     Palpations: Abdomen is soft. There is no hepatomegaly, splenomegaly or mass.     Tenderness: There is no abdominal tenderness.  Musculoskeletal:     Right lower leg: No edema.     Left lower leg: No edema.  Lymphadenopathy:     Cervical: No cervical adenopathy.     Right cervical: No superficial, deep or posterior cervical adenopathy.    Left cervical: No superficial, deep or posterior cervical adenopathy.     Upper Body:     Right upper body: No supraclavicular or axillary adenopathy.     Left upper body: No supraclavicular or axillary adenopathy.  Neurological:     General: No focal deficit present.     Mental Status: She is alert and oriented to person, place, and time.  Psychiatric:        Mood and Affect: Mood normal.        Behavior: Behavior normal.     LABS:      Latest Ref Rng & Units 05/08/2023    9:59 AM 11/01/2022    1:39 PM 10/19/2022    4:09 AM  CBC  WBC 4.0 - 10.5 K/uL 8.4  12.6  16.1   Hemoglobin 12.0 - 15.0 g/dL 60.4  54.0  98.1   Hematocrit 36.0 - 46.0 % 38.4  41.2  36.6   Platelets 150 - 400 K/uL 340  322  364       Latest Ref Rng & Units 05/08/2023    9:59 AM 11/01/2022    1:39 PM 10/19/2022    4:09 AM  CMP  Glucose 70 - 99 mg/dL 191  478  295   BUN 8 - 23 mg/dL 19  13  16    Creatinine 0.44 - 1.00 mg/dL 6.21  3.08  6.57   Sodium 135 - 145 mmol/L 136  138  138   Potassium 3.5 - 5.1 mmol/L 3.6  4.1  4.4   Chloride 98 - 111 mmol/L 102  103  100   CO2 22 - 32 mmol/L 25  28  26    Calcium 8.9 - 10.3 mg/dL 8.9  9.4  9.5   Total Protein  6.5 - 8.1 g/dL 7.3  7.1  6.6   Total Bilirubin 0.3 - 1.2 mg/dL 0.6  1.0  0.5   Alkaline Phos 38 - 126 U/L 71  75  77   AST 15 - 41 U/L 26  22  28    ALT 0 - 44 U/L 20  25  25       No results found for: "CEA1", "CEA" / No results found for: "CEA1", "CEA" No results found for: "PSA1" No results found for: "ZOX096" No results found for: "CAN125"  No results found for: "TOTALPROTELP", "ALBUMINELP", "A1GS", "A2GS", "BETS", "BETA2SER", "GAMS", "MSPIKE", "SPEI" No results found for: "TIBC", "FERRITIN", "IRONPCTSAT" No results found for: "LDH"   STUDIES:   CT Chest Wo Contrast  Result Date: 05/15/2023 CLINICAL DATA:  Neuroendocrine tumor of the left lung, status post left upper lobectomy on 12/15/2018. * Tracking Code: BO * EXAM: CT CHEST WITHOUT CONTRAST TECHNIQUE: Multidetector CT imaging of the chest was performed following the standard protocol without IV contrast. RADIATION DOSE REDUCTION: This exam was performed according to the departmental dose-optimization program which includes automated exposure control, adjustment of the mA and/or kV according to patient size and/or use of iterative reconstruction technique. COMPARISON:  11/01/2022 FINDINGS: Cardiovascular: Right Port-A-Cath tip mid to low SVC, similar. Aortic atherosclerosis. Mild cardiomegaly, without pericardial effusion. Left main and 3 vessel coronary artery calcification. Mediastinum/Nodes: No supraclavicular adenopathy. No mediastinal or hilar adenopathy, given limitations of unenhanced CT. Lungs/Pleura: No pleural fluid. Dependent secretions within the right mainstem bronchus. Status post left upper lobectomy. Minimal increase in bandlike opacity in the right lower lobe, consistent with scarring on 95/4. Similar left base scarring. Posterior right a upper lobe subpleural 3 mm nodule on 32/4 is unchanged and considered benign. Upper Abdomen: Cholecystectomy. Normal imaged portions of the liver, spleen, stomach, pancreas, adrenal  glands, kidneys. Musculoskeletal: Osteopenia. Accentuation of expected thoracic kyphosis. IMPRESSION: 1. Status post left upper lobectomy. No findings of recurrent or metastatic disease. 2. Aortic atherosclerosis (ICD10-I70.0), coronary artery atherosclerosis and emphysema (ICD10-J43.9). 3. Right Port-A-Cath tip in mid to low SVC, similar back to at least 10/17/2021. Electronically Signed   By: Jeronimo Greaves M.D.   On: 05/15/2023 09:07

## 2023-05-15 ENCOUNTER — Inpatient Hospital Stay: Payer: Medicare HMO | Admitting: Hematology

## 2023-05-15 VITALS — BP 132/54 | HR 65 | Temp 98.4°F | Resp 19 | Ht 63.5 in | Wt 202.0 lb

## 2023-05-15 DIAGNOSIS — C3490 Malignant neoplasm of unspecified part of unspecified bronchus or lung: Secondary | ICD-10-CM | POA: Diagnosis not present

## 2023-05-15 DIAGNOSIS — Z8261 Family history of arthritis: Secondary | ICD-10-CM | POA: Diagnosis not present

## 2023-05-15 DIAGNOSIS — K219 Gastro-esophageal reflux disease without esophagitis: Secondary | ICD-10-CM | POA: Diagnosis not present

## 2023-05-15 DIAGNOSIS — Z90710 Acquired absence of both cervix and uterus: Secondary | ICD-10-CM | POA: Diagnosis not present

## 2023-05-15 DIAGNOSIS — C7A8 Other malignant neuroendocrine tumors: Secondary | ICD-10-CM | POA: Diagnosis not present

## 2023-05-15 DIAGNOSIS — Z87891 Personal history of nicotine dependence: Secondary | ICD-10-CM | POA: Diagnosis not present

## 2023-05-15 DIAGNOSIS — Z9049 Acquired absence of other specified parts of digestive tract: Secondary | ICD-10-CM | POA: Diagnosis not present

## 2023-05-15 DIAGNOSIS — Z8379 Family history of other diseases of the digestive system: Secondary | ICD-10-CM | POA: Diagnosis not present

## 2023-05-15 DIAGNOSIS — J439 Emphysema, unspecified: Secondary | ICD-10-CM | POA: Diagnosis not present

## 2023-05-15 DIAGNOSIS — Z79899 Other long term (current) drug therapy: Secondary | ICD-10-CM | POA: Diagnosis not present

## 2023-05-15 DIAGNOSIS — I10 Essential (primary) hypertension: Secondary | ICD-10-CM | POA: Diagnosis not present

## 2023-05-15 DIAGNOSIS — Z806 Family history of leukemia: Secondary | ICD-10-CM | POA: Diagnosis not present

## 2023-05-15 DIAGNOSIS — Z803 Family history of malignant neoplasm of breast: Secondary | ICD-10-CM | POA: Diagnosis not present

## 2023-05-15 DIAGNOSIS — Z8249 Family history of ischemic heart disease and other diseases of the circulatory system: Secondary | ICD-10-CM | POA: Diagnosis not present

## 2023-05-15 DIAGNOSIS — Z801 Family history of malignant neoplasm of trachea, bronchus and lung: Secondary | ICD-10-CM | POA: Diagnosis not present

## 2023-05-15 DIAGNOSIS — Z853 Personal history of malignant neoplasm of breast: Secondary | ICD-10-CM | POA: Diagnosis not present

## 2023-05-15 DIAGNOSIS — Z87442 Personal history of urinary calculi: Secondary | ICD-10-CM | POA: Diagnosis not present

## 2023-05-15 DIAGNOSIS — Z800 Family history of malignant neoplasm of digestive organs: Secondary | ICD-10-CM | POA: Diagnosis not present

## 2023-05-15 DIAGNOSIS — E785 Hyperlipidemia, unspecified: Secondary | ICD-10-CM | POA: Diagnosis not present

## 2023-05-15 NOTE — Patient Instructions (Addendum)
[facility] at [facility] Discharge Instructions   You were seen and examined today by Dr. Ellin Saba.  He reviewed the results of your CT scan which was normal/stable. There is no evidence of cancer on this exam.   He reviewed the results of your lab work which is normal/stable.   We will see you back in 6 months. We will repeat lab work and a CT scan prior to this visit.    Thank you for choosing [facility] at [facility] to provide your oncology and hematology care.  To afford each patient quality time with our provider, please arrive at least 15 minutes before your scheduled appointment time.   If you have a lab appointment with the Cancer Center please come in thru the Main Entrance and check in at the main information desk.  You need to re-schedule your appointment should you arrive 10 or more minutes late.  We strive to give you quality time with our providers, and arriving late affects you and other patients whose appointments are after yours.  Also, if you no show three or more times for appointments you may be dismissed from the clinic at the providers discretion.     Again, thank you for choosing [facility] [place].  Our hope is that these requests will decrease the amount of time that you wait before being seen by our physicians.       _____________________________________________________________  Should you have questions after your visit to [facility], please contact our office at [phone] and follow the prompts.  Our office hours are 8:00 a.m. and 4:30 p.m. Monday - Friday.  Please note that voicemails left after 4:00 p.m. may not be returned until the following business day.  We are closed weekends and major holidays.  You do have access to a nurse 24-7, just call the main number to the clinic [phone] and do not press any options, hold on the line and a nurse will answer the phone.    For prescription  refill requests, have your pharmacy contact our office and allow 72 hours.    Due to Covid, you will need to wear a mask upon entering the hospital. If you do not have a mask, a mask will be given to you at the Main Entrance upon arrival. For doctor visits, patients may have 1 support person age 37 or older with them. For treatment visits, patients can not have anyone with them due to social distancing guidelines and our immunocompromised population.

## 2023-05-16 ENCOUNTER — Ambulatory Visit: Payer: Medicare HMO | Admitting: Hematology

## 2023-06-05 DIAGNOSIS — E7801 Familial hypercholesterolemia: Secondary | ICD-10-CM | POA: Diagnosis not present

## 2023-06-05 DIAGNOSIS — I10 Essential (primary) hypertension: Secondary | ICD-10-CM | POA: Diagnosis not present

## 2023-06-05 DIAGNOSIS — F339 Major depressive disorder, recurrent, unspecified: Secondary | ICD-10-CM | POA: Diagnosis not present

## 2023-06-05 DIAGNOSIS — Z85118 Personal history of other malignant neoplasm of bronchus and lung: Secondary | ICD-10-CM | POA: Diagnosis not present

## 2023-06-05 DIAGNOSIS — M542 Cervicalgia: Secondary | ICD-10-CM | POA: Diagnosis not present

## 2023-06-05 DIAGNOSIS — M549 Dorsalgia, unspecified: Secondary | ICD-10-CM | POA: Diagnosis not present

## 2023-06-05 DIAGNOSIS — M810 Age-related osteoporosis without current pathological fracture: Secondary | ICD-10-CM | POA: Diagnosis not present

## 2023-06-05 DIAGNOSIS — J440 Chronic obstructive pulmonary disease with acute lower respiratory infection: Secondary | ICD-10-CM | POA: Diagnosis not present

## 2023-06-05 DIAGNOSIS — G8929 Other chronic pain: Secondary | ICD-10-CM | POA: Diagnosis not present

## 2023-08-26 DIAGNOSIS — Z0001 Encounter for general adult medical examination with abnormal findings: Secondary | ICD-10-CM | POA: Diagnosis not present

## 2023-08-26 DIAGNOSIS — E7801 Familial hypercholesterolemia: Secondary | ICD-10-CM | POA: Diagnosis not present

## 2023-08-26 DIAGNOSIS — E559 Vitamin D deficiency, unspecified: Secondary | ICD-10-CM | POA: Diagnosis not present

## 2023-08-26 DIAGNOSIS — J449 Chronic obstructive pulmonary disease, unspecified: Secondary | ICD-10-CM | POA: Diagnosis not present

## 2023-08-26 DIAGNOSIS — Z1329 Encounter for screening for other suspected endocrine disorder: Secondary | ICD-10-CM | POA: Diagnosis not present

## 2023-08-26 DIAGNOSIS — I10 Essential (primary) hypertension: Secondary | ICD-10-CM | POA: Diagnosis not present

## 2023-08-26 DIAGNOSIS — Z131 Encounter for screening for diabetes mellitus: Secondary | ICD-10-CM | POA: Diagnosis not present

## 2023-08-26 DIAGNOSIS — Z85118 Personal history of other malignant neoplasm of bronchus and lung: Secondary | ICD-10-CM | POA: Diagnosis not present

## 2023-08-26 DIAGNOSIS — K219 Gastro-esophageal reflux disease without esophagitis: Secondary | ICD-10-CM | POA: Diagnosis not present

## 2023-08-27 ENCOUNTER — Encounter: Payer: Self-pay | Admitting: Cardiology

## 2023-08-27 ENCOUNTER — Ambulatory Visit: Payer: Medicare HMO | Attending: Cardiology | Admitting: Cardiology

## 2023-08-27 ENCOUNTER — Encounter: Payer: Self-pay | Admitting: Internal Medicine

## 2023-08-27 VITALS — BP 120/64 | HR 88 | Ht 63.0 in | Wt 205.4 lb

## 2023-08-27 DIAGNOSIS — R079 Chest pain, unspecified: Secondary | ICD-10-CM

## 2023-08-27 DIAGNOSIS — I10 Essential (primary) hypertension: Secondary | ICD-10-CM

## 2023-08-27 DIAGNOSIS — E785 Hyperlipidemia, unspecified: Secondary | ICD-10-CM | POA: Diagnosis not present

## 2023-08-27 DIAGNOSIS — R0602 Shortness of breath: Secondary | ICD-10-CM

## 2023-08-27 NOTE — Progress Notes (Signed)
Clinical Summary Ms. Pitstick is a 77 y.o.female seen today for follow of the following medical problems.   1.History of chest pain - 12/2020 [facility] echo LVEF 60-65%, grade I dd, mild MR, mild AI, mod pulm HTN, normal RV 12/2020 nuclear stress test: possible small apical infarct, no current ischemia 12/2021 coronary CTA: calcium score 1269, mild CAD.   -occasonal chest pain, chronic stable symptoms. Typically comes on with rest. Occurs 2-3 times per month - SOB at times with activity, for example folding clothes at home. Some cough, wheeze. No LE edema. Reports DOE has progressed to some degree.    2. Lung cancer/Stage IIa - history of LUL lobectomy - Carboplatin and etoposide x 4 cycles from 01/20/2019 to 03/23/2019       3. COPD - compliant with inhalers   4. HTN - home bp's 160s/70s  5. HLD - Jan 2023 TC 159 TG 84 HDL 51 LDL 92         Past Medical History:  Diagnosis Date   Anxiety    Arthritis    Asthma    Breast cancer (HCC)    breast, right DCIS   COPD (chronic obstructive pulmonary disease) (HCC)    COPD exacerbation (HCC) 01/03/2014   With hypoxia   Depression    Emphysema of lung (HCC)    Family history of breast cancer    Family history of colon cancer    Family history of kidney cancer    Family history of lung cancer    Family history of pancreatic cancer    GERD (gastroesophageal reflux disease)    History of kidney stones    Hyperlipidemia    Hypertension    Insomnia    Lung cancer (HCC)    Obesity 01/05/2014   Osteoporosis    Osteoporosis, unspecified 12/01/2013   Personal history of radiation therapy    rt breast   Pneumonia    x2   Tobacco abuse 12/01/2013   Ulnar nerve impingement, left      No Known Allergies   Current Outpatient Medications  Medication Sig Dispense Refill   albuterol (PROVENTIL HFA;VENTOLIN HFA) 108 (90 BASE) MCG/ACT inhaler Inhale 2 puffs into the lungs 3 (three) times daily. Also use the inhaler every 2 hours if  needed for worsening shortness of breath and wheezing. 1 Inhaler 3   albuterol (PROVENTIL) (2.5 MG/3ML) 0.083% nebulizer solution Take 3 mLs (2.5 mg total) by nebulization every 2 (two) hours as needed for shortness of breath. 75 mL 12   aspirin EC 81 MG tablet Take 1 tablet (81 mg total) by mouth daily. Swallow whole. 90 tablet 3   budesonide (PULMICORT) 0.25 MG/2ML nebulizer solution Take 2 mLs (0.25 mg total) by nebulization 2 (two) times daily. 60 mL 2   Calcium 600-200 MG-UNIT per tablet Take 1 tablet by mouth daily.     clonazePAM (KLONOPIN) 0.5 MG tablet Take 0.5 mg by mouth at bedtime.     dextromethorphan-guaiFENesin (MUCINEX DM) 30-600 MG 12hr tablet Take 1 tablet by mouth 2 (two) times daily. 30 tablet 0   FLUoxetine (PROZAC) 40 MG capsule Take 40 mg by mouth daily.     furosemide (LASIX) 20 MG tablet Take 20 mg by mouth as needed.     gabapentin (NEURONTIN) 300 MG capsule Take 300 mg by mouth 2 (two) times daily.     hydrochlorothiazide (HYDRODIURIL) 12.5 MG tablet Take 1 tablet (12.5 mg total) by mouth daily. 30 tablet 11  meloxicam (MOBIC) 7.5 MG tablet Take 1 tablet (7.5 mg total) by mouth daily as needed for pain.     metoprolol succinate (TOPROL-XL) 50 MG 24 hr tablet Take 50 mg by mouth daily.     nitroGLYCERIN (NITROSTAT) 0.4 MG SL tablet Place 1 tablet (0.4 mg total) under the tongue every 5 (five) minutes as needed for chest pain. (Patient not taking: Reported on 05/02/2022) 25 tablet 3   olmesartan (BENICAR) 40 MG tablet One daily 30 tablet 11   omeprazole (PRILOSEC) 40 MG capsule Take 40 mg by mouth daily.      predniSONE (DELTASONE) 10 MG tablet Take 40mg  po daily for 2 days then 30mg  daily for 2 days then 20mg  daily for 2 days then 10mg  daily for 2 days then stop 20 tablet 0   rosuvastatin (CRESTOR) 10 MG tablet Take 10 mg by mouth daily.     Tiotropium Bromide-Olodaterol 2.5-2.5 MCG/ACT AERS Inhale 2 puffs into the lungs daily.     tizanidine (ZANAFLEX) 2 MG capsule Take 1  capsule (2 mg total) by mouth daily as needed for muscle spasms.     No current facility-administered medications for this visit.     Past Surgical History:  Procedure Laterality Date   ABDOMINAL HYSTERECTOMY     BLADDER SURGERY     had tacked   BREAST LUMPECTOMY Right    BREAST LUMPECTOMY WITH NEEDLE LOCALIZATION AND AXILLARY SENTINEL LYMPH NODE BX Right 11/09/2013   Procedure: BREAST LUMPECTOMY WITH NEEDLE LOCALIZATION AND AXILLARY SENTINEL LYMPH NODE BIOPSY;  Surgeon: Shelly Rubenstein, MD;  Location: [facility] OR;  Service: General;  Laterality: Right;   BREAST SURGERY Right    lumpectomy   CATARACT EXTRACTION W/PHACO Right 06/15/2022   Procedure: CATARACT EXTRACTION PHACO AND INTRAOCULAR LENS PLACEMENT (IOC);  Surgeon: Fabio Pierce, MD;  Location: [facility] ORS;  Service: Ophthalmology;  Laterality: Right;  CDE 10.73   CATARACT EXTRACTION W/PHACO Left 06/29/2022   Procedure: CATARACT EXTRACTION PHACO AND INTRAOCULAR LENS PLACEMENT (IOC);  Surgeon: Fabio Pierce, MD;  Location: [facility] ORS;  Service: Ophthalmology;  Laterality: Left;  CDE: 11.37   CHOLECYSTECTOMY     NODE DISSECTION Left 12/15/2018   Procedure: NODE DISSECTION LEFT LUNG;  Surgeon: Loreli Slot, MD;  Location: [facility] OR;  Service: Thoracic;  Laterality: Left;   PORTACATH PLACEMENT Right 01/19/2019   Procedure: INSERTION PORT-A-CATH;  Surgeon: Franky Macho, MD;  Location: [facility] ORS;  Service: General;  Laterality: Right;   VIDEO ASSISTED THORACOSCOPY (VATS)/ LOBECTOMY Left 12/15/2018   Procedure: VIDEO ASSISTED THORACOSCOPY (VATS)/LEFT UPPER LOBECTOMY;  Surgeon: Loreli Slot, MD;  Location: [facility] OR;  Service: Thoracic;  Laterality: Left;     No Known Allergies    Family History  Problem Relation Age of Onset   Lung cancer Mother 37       non smoker   Rheum arthritis Mother    Pancreatic cancer Father 68       d. 80   Leukemia Sister        dx in her 72s   Heart disease Sister    Colon cancer Sister        dx and  died in her 63s   Heart disease Sister    Lung cancer Maternal Aunt        non-smoker   Heart disease Sister    Breast cancer Sister        dx early 38s   Heart disease Brother    Cirrhosis Brother  d. 47   Aneurysm Brother        d. 29s   Fibromyalgia Daughter    Kidney cancer Nephew 46   Kidney cancer Nephew        dx in 30s; great nephew   Leukemia Nephew    Liver disease Nephew    Other Niece        white matter on brain; dx late 71s   Leukemia Nephew      Social History Ms. Paterson reports that she quit smoking about 4 years ago. Her smoking use included cigarettes. She started smoking about 47 years ago. She has a 21.5 pack-year smoking history. She has never used smokeless tobacco. Ms. Eppard reports no history of alcohol use.   Review of Systems CONSTITUTIONAL: No weight loss, fever, chills, weakness or fatigue.  HEENT: Eyes: No visual loss, blurred vision, double vision or yellow sclerae.No hearing loss, sneezing, congestion, runny nose or sore throat.  SKIN: No rash or itching.  CARDIOVASCULAR: per hpi RESPIRATORY: per hpi GASTROINTESTINAL: No anorexia, nausea, vomiting or diarrhea. No abdominal pain or blood.  GENITOURINARY: No burning on urination, no polyuria NEUROLOGICAL: No headache, dizziness, syncope, paralysis, ataxia, numbness or tingling in the extremities. No change in bowel or bladder control.  MUSCULOSKELETAL: No muscle, back pain, joint pain or stiffness.  LYMPHATICS: No enlarged nodes. No history of splenectomy.  PSYCHIATRIC: No history of depression or anxiety.  ENDOCRINOLOGIC: No reports of sweating, cold or heat intolerance. No polyuria or polydipsia.  Marland Kitchen   Physical Examination Today's Vitals   08/27/23 0831  BP: 120/64  Pulse: 88  SpO2: 92%  Weight: 205 lb 6.4 oz (93.2 kg)  Height: 5\' 3"  (1.6 m)   Body mass index is 36.38 kg/m.  Gen: resting comfortably, no acute distress HEENT: no scleral icterus, pupils equal round and reactive,  no palptable cervical adenopathy,  CV: RRR, no m/rg, no jvd Resp: Clear to auscultation bilaterally GI: abdomen is soft, non-tender, non-distended, normal bowel sounds, no hepatosplenomegaly MSK: extremities are warm, no edema.  Skin: warm, no rash Neuro:  no focal deficits Psych: appropriate affect     Assessment and Plan  1.Chest pain - long history, ischemic testing over the years has been benign including a coronary CTA last year - contine to monitor  2. SOB/DOE - obtain echo, if benign should f/u with her pulmonologist for her COPD  3. HTN  At goal, continue current meds  4. HLD - reports pending labs with pcp, LDL goal given elevated coronary calcium score would be <70.  F/u labs, continue crestor 10mg  daily.       Antoine Poche, M.D.

## 2023-08-27 NOTE — Patient Instructions (Signed)
Medication Instructions:  Your physician recommends that you continue on your current medications as directed. Please refer to the Current Medication list given to you today.  *If you need a refill on your cardiac medications before your next appointment, please call your pharmacy*   Lab Work: None If you have labs (blood work) drawn today and your tests are completely normal, you will receive your results only by: MyChart Message (if you have MyChart) OR A paper copy in the mail If you have any lab test that is abnormal or we need to change your treatment, we will call you to review the results.   Testing/Procedures: Your physician has requested that you have an echocardiogram. Echocardiography is a painless test that uses sound waves to create images of your heart. It provides your doctor with information about the size and shape of your heart and how well your heart's chambers and valves are working. This procedure takes approximately one hour. There are no restrictions for this procedure. Please do NOT wear cologne, perfume, aftershave, or lotions (deodorant is allowed). Please arrive 15 minutes prior to your appointment time.    Follow-Up: At [facility], you and your health needs are our priority.  As part of our continuing mission to provide you with exceptional heart care, we have created designated Provider Care Teams.  These Care Teams include your primary Cardiologist (physician) and Advanced Practice Providers (APPs -  Physician Assistants and Nurse Practitioners) who all work together to provide you with the care you need, when you need it.  We recommend signing up for the patient portal called "MyChart".  Sign up information is provided on this After Visit Summary.  MyChart is used to connect with patients for Virtual Visits (Telemedicine).  Patients are able to view lab/test results, encounter notes, upcoming appointments, etc.  Non-urgent messages can be sent to your  provider as well.   To learn more about what you can do with MyChart, go to [email or web address].    Your next appointment:   6 month(s)  Provider:   You may see Dina Rich, MD or one of the following Advanced Practice Providers on your designated Care Team:   Randall An, PA-C  Jacolyn Reedy, [place]     Other Instructions

## 2023-08-28 DIAGNOSIS — I10 Essential (primary) hypertension: Secondary | ICD-10-CM | POA: Diagnosis not present

## 2023-08-28 DIAGNOSIS — M542 Cervicalgia: Secondary | ICD-10-CM | POA: Diagnosis not present

## 2023-08-28 DIAGNOSIS — F339 Major depressive disorder, recurrent, unspecified: Secondary | ICD-10-CM | POA: Diagnosis not present

## 2023-08-28 DIAGNOSIS — Z0001 Encounter for general adult medical examination with abnormal findings: Secondary | ICD-10-CM | POA: Diagnosis not present

## 2023-08-28 DIAGNOSIS — J440 Chronic obstructive pulmonary disease with acute lower respiratory infection: Secondary | ICD-10-CM | POA: Diagnosis not present

## 2023-08-28 DIAGNOSIS — M810 Age-related osteoporosis without current pathological fracture: Secondary | ICD-10-CM | POA: Diagnosis not present

## 2023-08-28 DIAGNOSIS — E7801 Familial hypercholesterolemia: Secondary | ICD-10-CM | POA: Diagnosis not present

## 2023-08-28 DIAGNOSIS — G8929 Other chronic pain: Secondary | ICD-10-CM | POA: Diagnosis not present

## 2023-08-28 DIAGNOSIS — M549 Dorsalgia, unspecified: Secondary | ICD-10-CM | POA: Diagnosis not present

## 2023-10-01 ENCOUNTER — Ambulatory Visit (HOSPITAL_COMMUNITY): Admission: RE | Admit: 2023-10-01 | Payer: Medicare HMO | Source: Ambulatory Visit

## 2023-10-02 ENCOUNTER — Ambulatory Visit (HOSPITAL_COMMUNITY)
Admission: RE | Admit: 2023-10-02 | Discharge: 2023-10-02 | Disposition: A | Discharge status: Home / Self Care | Payer: Medicare HMO | Source: Ambulatory Visit | Attending: Cardiology | Admitting: Cardiology

## 2023-10-02 DIAGNOSIS — R0602 Shortness of breath: Secondary | ICD-10-CM | POA: Insufficient documentation

## 2023-10-02 LAB — ECHOCARDIOGRAM COMPLETE
AR max vel: 2.17 cm2
AV Area VTI: 2.35 cm2
AV Area mean vel: 2.22 cm2
AV Mean grad: 4 mm[Hg]
AV Peak grad: 7.4 mm[Hg]
Ao pk vel: 1.36 m/s
Area-P 1/2: 3.48 cm2
MV VTI: 3.16 cm2
P 1/2 time: 804 ms
S' Lateral: 2.5 cm

## 2023-10-15 DIAGNOSIS — H353131 Nonexudative age-related macular degeneration, bilateral, early dry stage: Secondary | ICD-10-CM | POA: Diagnosis not present

## 2023-10-25 DIAGNOSIS — J329 Chronic sinusitis, unspecified: Secondary | ICD-10-CM | POA: Diagnosis not present

## 2023-10-25 DIAGNOSIS — R030 Elevated blood-pressure reading, without diagnosis of hypertension: Secondary | ICD-10-CM | POA: Diagnosis not present

## 2023-10-25 DIAGNOSIS — Z6836 Body mass index (BMI) 36.0-36.9, adult: Secondary | ICD-10-CM | POA: Diagnosis not present

## 2023-10-25 DIAGNOSIS — Z20828 Contact with and (suspected) exposure to other viral communicable diseases: Secondary | ICD-10-CM | POA: Diagnosis not present

## 2023-10-25 DIAGNOSIS — J40 Bronchitis, not specified as acute or chronic: Secondary | ICD-10-CM | POA: Diagnosis not present

## 2023-11-04 DIAGNOSIS — Z780 Asymptomatic menopausal state: Secondary | ICD-10-CM | POA: Diagnosis not present

## 2023-11-04 DIAGNOSIS — M810 Age-related osteoporosis without current pathological fracture: Secondary | ICD-10-CM | POA: Diagnosis not present

## 2023-11-06 ENCOUNTER — Other Ambulatory Visit: Payer: Self-pay | Admitting: Internal Medicine

## 2023-11-06 DIAGNOSIS — J449 Chronic obstructive pulmonary disease, unspecified: Secondary | ICD-10-CM

## 2023-11-13 ENCOUNTER — Other Ambulatory Visit: Payer: Self-pay

## 2023-11-13 DIAGNOSIS — Z95828 Presence of other vascular implants and grafts: Secondary | ICD-10-CM

## 2023-11-18 ENCOUNTER — Inpatient Hospital Stay: Payer: Medicare HMO | Attending: Hematology

## 2023-11-18 ENCOUNTER — Ambulatory Visit (HOSPITAL_COMMUNITY)
Admission: RE | Admit: 2023-11-18 | Discharge: 2023-11-18 | Disposition: A | Discharge status: Home / Self Care | Payer: Medicare HMO | Source: Ambulatory Visit | Attending: Hematology | Admitting: Hematology

## 2023-11-18 DIAGNOSIS — Z9049 Acquired absence of other specified parts of digestive tract: Secondary | ICD-10-CM | POA: Insufficient documentation

## 2023-11-18 DIAGNOSIS — C3412 Malignant neoplasm of upper lobe, left bronchus or lung: Secondary | ICD-10-CM | POA: Diagnosis not present

## 2023-11-18 DIAGNOSIS — M549 Dorsalgia, unspecified: Secondary | ICD-10-CM | POA: Insufficient documentation

## 2023-11-18 DIAGNOSIS — M47814 Spondylosis without myelopathy or radiculopathy, thoracic region: Secondary | ICD-10-CM | POA: Insufficient documentation

## 2023-11-18 DIAGNOSIS — C3490 Malignant neoplasm of unspecified part of unspecified bronchus or lung: Secondary | ICD-10-CM | POA: Insufficient documentation

## 2023-11-18 DIAGNOSIS — E669 Obesity, unspecified: Secondary | ICD-10-CM | POA: Diagnosis not present

## 2023-11-18 DIAGNOSIS — Z90710 Acquired absence of both cervix and uterus: Secondary | ICD-10-CM | POA: Insufficient documentation

## 2023-11-18 DIAGNOSIS — Z803 Family history of malignant neoplasm of breast: Secondary | ICD-10-CM | POA: Insufficient documentation

## 2023-11-18 DIAGNOSIS — Z95828 Presence of other vascular implants and grafts: Secondary | ICD-10-CM

## 2023-11-18 DIAGNOSIS — I700 Atherosclerosis of aorta: Secondary | ICD-10-CM | POA: Diagnosis not present

## 2023-11-18 DIAGNOSIS — Z806 Family history of leukemia: Secondary | ICD-10-CM | POA: Insufficient documentation

## 2023-11-18 DIAGNOSIS — M542 Cervicalgia: Secondary | ICD-10-CM | POA: Diagnosis not present

## 2023-11-18 DIAGNOSIS — Z8261 Family history of arthritis: Secondary | ICD-10-CM | POA: Insufficient documentation

## 2023-11-18 DIAGNOSIS — Z8249 Family history of ischemic heart disease and other diseases of the circulatory system: Secondary | ICD-10-CM | POA: Diagnosis not present

## 2023-11-18 DIAGNOSIS — Z85118 Personal history of other malignant neoplasm of bronchus and lung: Secondary | ICD-10-CM | POA: Insufficient documentation

## 2023-11-18 DIAGNOSIS — Z801 Family history of malignant neoplasm of trachea, bronchus and lung: Secondary | ICD-10-CM | POA: Diagnosis not present

## 2023-11-18 DIAGNOSIS — Z853 Personal history of malignant neoplasm of breast: Secondary | ICD-10-CM | POA: Diagnosis not present

## 2023-11-18 DIAGNOSIS — Z79899 Other long term (current) drug therapy: Secondary | ICD-10-CM | POA: Diagnosis not present

## 2023-11-18 DIAGNOSIS — R61 Generalized hyperhidrosis: Secondary | ICD-10-CM | POA: Insufficient documentation

## 2023-11-18 DIAGNOSIS — Z87891 Personal history of nicotine dependence: Secondary | ICD-10-CM | POA: Diagnosis not present

## 2023-11-18 DIAGNOSIS — I3481 Nonrheumatic mitral (valve) annulus calcification: Secondary | ICD-10-CM | POA: Insufficient documentation

## 2023-11-18 DIAGNOSIS — I10 Essential (primary) hypertension: Secondary | ICD-10-CM | POA: Insufficient documentation

## 2023-11-18 DIAGNOSIS — Z800 Family history of malignant neoplasm of digestive organs: Secondary | ICD-10-CM | POA: Insufficient documentation

## 2023-11-18 DIAGNOSIS — C7A1 Malignant poorly differentiated neuroendocrine tumors: Secondary | ICD-10-CM | POA: Insufficient documentation

## 2023-11-18 DIAGNOSIS — Z87442 Personal history of urinary calculi: Secondary | ICD-10-CM | POA: Insufficient documentation

## 2023-11-18 DIAGNOSIS — Z8379 Family history of other diseases of the digestive system: Secondary | ICD-10-CM | POA: Diagnosis not present

## 2023-11-18 LAB — CBC WITH DIFFERENTIAL/PLATELET
Abs Immature Granulocytes: 0.04 10*3/uL (ref 0.00–0.07)
Basophils Absolute: 0.1 10*3/uL (ref 0.0–0.1)
Basophils Relative: 1 %
Eosinophils Absolute: 0.2 10*3/uL (ref 0.0–0.5)
Eosinophils Relative: 2 %
HCT: 38.4 % (ref 36.0–46.0)
Hemoglobin: 11.8 g/dL — ABNORMAL LOW (ref 12.0–15.0)
Immature Granulocytes: 1 %
Lymphocytes Relative: 20 %
Lymphs Abs: 1.6 10*3/uL (ref 0.7–4.0)
MCH: 29.2 pg (ref 26.0–34.0)
MCHC: 30.7 g/dL (ref 30.0–36.0)
MCV: 95 fL (ref 80.0–100.0)
Monocytes Absolute: 0.5 10*3/uL (ref 0.1–1.0)
Monocytes Relative: 6 %
Neutro Abs: 5.7 10*3/uL (ref 1.7–7.7)
Neutrophils Relative %: 70 %
Platelets: 338 10*3/uL (ref 150–400)
RBC: 4.04 MIL/uL (ref 3.87–5.11)
RDW: 14.6 % (ref 11.5–15.5)
WBC: 8.1 10*3/uL (ref 4.0–10.5)
nRBC: 0 % (ref 0.0–0.2)

## 2023-11-18 LAB — COMPREHENSIVE METABOLIC PANEL
ALT: 20 U/L (ref 0–44)
AST: 31 U/L (ref 15–41)
Albumin: 3.6 g/dL (ref 3.5–5.0)
Alkaline Phosphatase: 64 U/L (ref 38–126)
Anion gap: 5 (ref 5–15)
BUN: 22 mg/dL (ref 8–23)
CO2: 26 mmol/L (ref 22–32)
Calcium: 9 mg/dL (ref 8.9–10.3)
Chloride: 105 mmol/L (ref 98–111)
Creatinine, Ser: 0.67 mg/dL (ref 0.44–1.00)
GFR, Estimated: >60 mL/min (ref >60)
Glucose, Bld: 165 mg/dL — ABNORMAL HIGH (ref 70–99)
Potassium: 3.5 mmol/L (ref 3.5–5.1)
Sodium: 136 mmol/L (ref 135–145)
Total Bilirubin: 0.9 mg/dL (ref <1.2)
Total Protein: 7.3 g/dL (ref 6.5–8.1)

## 2023-11-18 MED ORDER — HEPARIN SOD (PORK) LOCK FLUSH 100 UNIT/ML IV SOLN
500.0000 [IU] | Freq: Once | INTRAVENOUS | Status: AC
Start: 2023-11-18 — End: 2023-11-18
  Administered 2023-11-18: 500 [IU] via INTRAVENOUS

## 2023-11-18 MED ORDER — SODIUM CHLORIDE 0.9% FLUSH
10.0000 mL | Freq: Once | INTRAVENOUS | Status: AC
Start: 2023-11-18 — End: 2023-11-18
  Administered 2023-11-18: 10 mL via INTRAVENOUS

## 2023-11-18 NOTE — Progress Notes (Signed)
Patients port flushed without difficulty.  Good blood return noted with no bruising or swelling noted at site.  Band aid applied.  VSS with discharge and left in satisfactory condition with no s/s of distress noted.   

## 2023-11-25 ENCOUNTER — Inpatient Hospital Stay: Payer: Medicare HMO | Admitting: Hematology

## 2023-11-25 VITALS — BP 116/54 | HR 70 | Temp 98.4°F | Resp 19 | Wt 204.9 lb

## 2023-11-25 DIAGNOSIS — R61 Generalized hyperhidrosis: Secondary | ICD-10-CM | POA: Diagnosis not present

## 2023-11-25 DIAGNOSIS — M549 Dorsalgia, unspecified: Secondary | ICD-10-CM | POA: Diagnosis not present

## 2023-11-25 DIAGNOSIS — C3400 Malignant neoplasm of unspecified main bronchus: Secondary | ICD-10-CM

## 2023-11-25 DIAGNOSIS — I3481 Nonrheumatic mitral (valve) annulus calcification: Secondary | ICD-10-CM | POA: Diagnosis not present

## 2023-11-25 DIAGNOSIS — I10 Essential (primary) hypertension: Secondary | ICD-10-CM | POA: Diagnosis not present

## 2023-11-25 DIAGNOSIS — M542 Cervicalgia: Secondary | ICD-10-CM | POA: Diagnosis not present

## 2023-11-25 DIAGNOSIS — M47814 Spondylosis without myelopathy or radiculopathy, thoracic region: Secondary | ICD-10-CM | POA: Diagnosis not present

## 2023-11-25 DIAGNOSIS — I700 Atherosclerosis of aorta: Secondary | ICD-10-CM | POA: Diagnosis not present

## 2023-11-25 DIAGNOSIS — C7A1 Malignant poorly differentiated neuroendocrine tumors: Secondary | ICD-10-CM | POA: Diagnosis not present

## 2023-11-25 DIAGNOSIS — E669 Obesity, unspecified: Secondary | ICD-10-CM | POA: Diagnosis not present

## 2023-11-25 NOTE — Patient Instructions (Signed)
[facility] at [facility] Discharge Instructions   You were seen and examined today by Dr. Ellin Saba.  He reviewed the results of your lab work which was normal.  He reviewed the results of your CT scan which did not show any evidence of cancer.   We will see you back in 6 months. We will repeat lab work and a CT scan prior to this visit.   Return as scheduled.    Thank you for choosing [facility] at [facility] to provide your oncology and hematology care.  To afford each patient quality time with our provider, please arrive at least 15 minutes before your scheduled appointment time.   If you have a lab appointment with the Cancer Center please come in thru the Main Entrance and check in at the main information desk.  You need to re-schedule your appointment should you arrive 10 or more minutes late.  We strive to give you quality time with our providers, and arriving late affects you and other patients whose appointments are after yours.  Also, if you no show three or more times for appointments you may be dismissed from the clinic at the providers discretion.     Again, thank you for choosing [facility].  Our hope is that these requests will decrease the amount of time that you wait before being seen by our physicians.       _____________________________________________________________  Should you have questions after your visit to [facility], please contact our office at [phone] and follow the prompts.  Our office hours are 8:00 a.m. and 4:30 p.m. Monday - Friday.  Please note that voicemails left after 4:00 p.m. may not be returned until the following business day.  We are closed weekends and major holidays.  You do have access to a nurse 24-7, just call the main number to the clinic [phone] and do not press any options, hold on the line and a nurse will answer the phone.    For prescription refill  requests, have your pharmacy contact our office and allow 72 hours.    Due to Covid, you will need to wear a mask upon entering the hospital. If you do not have a mask, a mask will be given to you at the Main Entrance upon arrival. For doctor visits, patients may have 1 support person age 64 or older with them. For treatment visits, patients can not have anyone with them due to social distancing guidelines and our immunocompromised population.

## 2023-11-25 NOTE — Progress Notes (Signed)
[facility] [address] [place] [address]    Clinic Day:  11/25/2023  Referring physician: Donetta Potts, MD  Patient Care Team: Donetta Potts, MD as PCP - General (Internal Medicine) [name] [name], MD as PCP - Cardiology (Cardiology) Artis Delay, MD as Consulting Physician (Hematology and Oncology)   ASSESSMENT & PLAN:   Assessment: 1.  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  She was found to have an abnormal chest x-ray followed by CT scan which showed left upper lobe lung mass. - She smoked 1 to 2 packs of cigarettes per day for the last 50 years.  She quit smoking on 10/23/2018. - Denies any significant weight loss.  But has night sweats occasionally.  Denies any hemoptysis. - PET CT scan on 11/17/2018 showed left upper lobe lung mass, 4.9 cm, no evidence of adenopathy or metastasis.  MRI of the brain was negative for intracranial mets. - Left upper lobectomy and lymph node biopsy on 12/15/2018.  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   -Cancer type ID testing shows 96% probability of neuroendocrine carcinoma (small cell/large cell carcinoma). -4 cycles of adjuvant chemotherapy with carboplatin and etoposide from 01/20/2019 through 03/23/2019. -CT of the chest on 08/25/2019 showed left upper lobectomy changes with tiny pulmonary nodules unchanged. -CT chest with contrast on 01/06/2021 with stable surgical changes in the left upper lobectomy.  New 22 mm right middle lobe density, likely an area of infection/inflammation.  No adenopathy.   2.  Family history: -Mother had lung cancer and was never smoker.  Father had pancreatic cancer. -1 sister has colon cancer in 1 sister with leukemia.  Another sister had breast cancer. -[facility] testing on 12/09/2018 was  negative for hereditary malignancies.  Plan:  1.  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 - No recent infections reported. - Reviewed labs from 11/17/2026: Grossly within normal limits. - CT chest (11/18/2023): No evidence of recurrence of cancer.  Other benign findings including cirrhosis, valve calcifications, coronary artery calcifications were discussed with the patient. - Recommend follow-up in 6 months with repeat scan and labs.   Orders Placed This Encounter  Procedures   CT Chest Wo Contrast    Standing Status:   Future    Expected Date:   05/25/2024    Expiration Date:   11/24/2024    Preferred imaging location?:   [facility]      [name], [place]   12/30/202411:23 AM  CHIEF COMPLAINT:   Diagnosis: left neuroendocrine lung cancer    Cancer Staging  DCIS (ductal carcinoma in situ) of breast Staging form: Breast, AJCC 7th Edition - Clinical stage from 12/01/2013: Stage 0 (Tis (DCIS), N0, cM0) - Signed by Artis Delay, MD on 12/01/2013 - Pathologic: Stage 0 (Tis (DCIS), N0, cM0) - Signed by Artis Delay, MD on 12/01/2013    Prior Therapy: 1. Left upper lobectomy on 12/15/2018. 2. Carboplatin and etoposide x 4 cycles from 01/20/2019 to 03/23/2019.  Current Therapy:  surveillance    HISTORY OF PRESENT ILLNESS:   Oncology History  DCIS (ductal carcinoma in situ) of breast  10/28/2013 Initial Diagnosis   DCIS (ductal carcinoma in situ) of breast   12/21/2018 Genetic Testing  Negative genetic testing on the multicancer panel.  The Multi-Gene Panel offered by [facility] includes sequencing and/or deletion duplication testing of the following 84 genes: AIP, ALK, APC, ATM, AXIN2,BAP1,  BARD1, BLM, BMPR1A, BRCA1, BRCA2, BRIP1, CASR, CDC73, CDH1, CDK4, CDKN1B, CDKN1C, CDKN2A (p14ARF), CDKN2A (p16INK4a), CEBPA, CHEK2, CTNNA1, DICER1, DIS3L2, EGFR (c.2369C>T, p.Thr790Met variant only),  EPCAM (Deletion/duplication testing only), FH, FLCN, GATA2, GPC3, GREM1 (Promoter region deletion/duplication testing only), HOXB13 (c.251G>A, p.Gly84Glu), HRAS, KIT, MAX, MEN1, MET, MITF (c.952G>A, p.Glu318Lys variant only), MLH1, MSH2, MSH3, MSH6, MUTYH, NBN, NF1, NF2, NTHL1, PALB2, PDGFRA, PHOX2B, PMS2, POLD1, POLE, POT1, PRKAR1A, PTCH1, PTEN, RAD50, RAD51C, RAD51D, RB1, RECQL4, RET, RUNX1, SDHAF2, SDHA (sequence changes only), SDHB, SDHC, SDHD, SMAD4, SMARCA4, SMARCB1, SMARCE1, STK11, SUFU, TERC, TERT, TMEM127, TP53, TSC1, TSC2, VHL, WRN and WT1.  The report date is 12/21/2018.   Lung cancer (HCC)  12/15/2018 Initial Diagnosis   Lung cancer (HCC)   12/21/2018 Genetic Testing   Negative genetic testing on the multicancer panel.  The Multi-Gene Panel offered by [facility] includes sequencing and/or deletion duplication testing of the following 84 genes: AIP, ALK, APC, ATM, AXIN2,BAP1,  BARD1, BLM, BMPR1A, BRCA1, BRCA2, BRIP1, CASR, CDC73, CDH1, CDK4, CDKN1B, CDKN1C, CDKN2A (p14ARF), CDKN2A (p16INK4a), CEBPA, CHEK2, CTNNA1, DICER1, DIS3L2, EGFR (c.2369C>T, p.Thr790Met variant only), EPCAM (Deletion/duplication testing only), FH, FLCN, GATA2, GPC3, GREM1 (Promoter region deletion/duplication testing only), HOXB13 (c.251G>A, p.Gly84Glu), HRAS, KIT, MAX, MEN1, MET, MITF (c.952G>A, p.Glu318Lys variant only), MLH1, MSH2, MSH3, MSH6, MUTYH, NBN, NF1, NF2, NTHL1, PALB2, PDGFRA, PHOX2B, PMS2, POLD1, POLE, POT1, PRKAR1A, PTCH1, PTEN, RAD50, RAD51C, RAD51D, RB1, RECQL4, RET, RUNX1, SDHAF2, SDHA (sequence changes only), SDHB, SDHC, SDHD, SMAD4, SMARCA4, SMARCB1, SMARCE1, STK11, SUFU, TERC, TERT, TMEM127, TP53, TSC1, TSC2, VHL, WRN and WT1.  The report date is 12/21/2018.   Primary spindle cell carcinoma of lung (HCC)  12/20/2018 Initial Diagnosis   Primary spindle cell carcinoma of lung (HCC)   12/21/2018 Genetic Testing   Negative genetic testing on the multicancer panel.  The Multi-Gene Panel offered by [facility]  includes sequencing and/or deletion duplication testing of the following 84 genes: AIP, ALK, APC, ATM, AXIN2,BAP1,  BARD1, BLM, BMPR1A, BRCA1, BRCA2, BRIP1, CASR, CDC73, CDH1, CDK4, CDKN1B, CDKN1C, CDKN2A (p14ARF), CDKN2A (p16INK4a), CEBPA, CHEK2, CTNNA1, DICER1, DIS3L2, EGFR (c.2369C>T, p.Thr790Met variant only), EPCAM (Deletion/duplication testing only), FH, FLCN, GATA2, GPC3, GREM1 (Promoter region deletion/duplication testing only), HOXB13 (c.251G>A, p.Gly84Glu), HRAS, KIT, MAX, MEN1, MET, MITF (c.952G>A, p.Glu318Lys variant only), MLH1, MSH2, MSH3, MSH6, MUTYH, NBN, NF1, NF2, NTHL1, PALB2, PDGFRA, PHOX2B, PMS2, POLD1, POLE, POT1, PRKAR1A, PTCH1, PTEN, RAD50, RAD51C, RAD51D, RB1, RECQL4, RET, RUNX1, SDHAF2, SDHA (sequence changes only), SDHB, SDHC, SDHD, SMAD4, SMARCA4, SMARCB1, SMARCE1, STK11, SUFU, TERC, TERT, TMEM127, TP53, TSC1, TSC2, VHL, WRN and WT1.  The report date is 12/21/2018.   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HCC)  01/12/2019 Initial Diagnosis   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HCC)   01/20/2019 - 03/26/2019 Chemotherapy   Patient is on Treatment Plan : LUNG SMALL CELL Carboplatin D1 / Etoposide D1-3 q21d        INTERVAL HISTORY:   Laura Abbott is a 77 y.o. female seen for follow-up of left neuroendocrine lung cancer.  Since last visit she denies any significant changes in her breathing status.  Appetite is reported at 60% and energy levels 25%.  Chronic neck and back pain has been stable.  PAST MEDICAL HISTORY:   Past Medical History: Past Medical History:  Diagnosis Date   Anxiety    Arthritis    Asthma    Breast  cancer [facility])    breast, right DCIS   COPD (chronic obstructive pulmonary disease) (HCC)    COPD exacerbation (HCC) 01/03/2014   With hypoxia   Depression    Emphysema of lung (HCC)    Family history of breast cancer    Family history of colon cancer    Family history of kidney cancer    Family history of lung cancer    Family history of pancreatic cancer    GERD (gastroesophageal  reflux disease)    History of kidney stones    Hyperlipidemia    Hypertension    Insomnia    Lung cancer (HCC)    Obesity 01/05/2014   Osteoporosis    Osteoporosis, unspecified 12/01/2013   Personal history of radiation therapy    rt breast   Pneumonia    x2   Tobacco abuse 12/01/2013   Ulnar nerve impingement, left     Surgical History: Past Surgical History:  Procedure Laterality Date   ABDOMINAL HYSTERECTOMY     BLADDER SURGERY     had tacked   BREAST LUMPECTOMY Right    BREAST LUMPECTOMY WITH NEEDLE LOCALIZATION AND AXILLARY SENTINEL LYMPH NODE BX Right 11/09/2013   Procedure: BREAST LUMPECTOMY WITH NEEDLE LOCALIZATION AND AXILLARY SENTINEL LYMPH NODE BIOPSY;  Surgeon: Shelly Rubenstein, MD;  Location: [facility] OR;  Service: General;  Laterality: Right;   BREAST SURGERY Right    lumpectomy   CATARACT EXTRACTION W/PHACO Right 06/15/2022   Procedure: CATARACT EXTRACTION PHACO AND INTRAOCULAR LENS PLACEMENT (IOC);  Surgeon: Fabio Pierce, MD;  Location: [facility] ORS;  Service: Ophthalmology;  Laterality: Right;  CDE 10.73   CATARACT EXTRACTION W/PHACO Left 06/29/2022   Procedure: CATARACT EXTRACTION PHACO AND INTRAOCULAR LENS PLACEMENT (IOC);  Surgeon: Fabio Pierce, MD;  Location: [facility] ORS;  Service: Ophthalmology;  Laterality: Left;  CDE: 11.37   CHOLECYSTECTOMY     NODE DISSECTION Left 12/15/2018   Procedure: NODE DISSECTION LEFT LUNG;  Surgeon: Loreli Slot, MD;  Location[facility] OR;  Service: Thoracic;  Laterality: Left;   PORTACATH PLACEMENT Right 01/19/2019   Procedure: INSERTION PORT-A-CATH;  Surgeon: Franky Macho, MD;  Location: [facility] ORS;  Service: General;  Laterality: Right;   VIDEO ASSISTED THORACOSCOPY (VATS)/ LOBECTOMY Left 12/15/2018   Procedure: VIDEO ASSISTED THORACOSCOPY (VATS)/LEFT UPPER LOBECTOMY;  Surgeon: Loreli Slot, MD;  Location: [facility] OR;  Service: Thoracic;  Laterality: Left;    Social History: Social History   Socioeconomic History   Marital status:  Married    Spouse name: Not on file   Number of children: 3   Years of education: Not on file   Highest education level: Not on file  Occupational History   Occupation: Engineer, maintenance (IT)    Comment: Oceanographer, retired    Comment: Programmer, applications    Comment: [employer]  Tobacco Use   Smoking status: Former    Current packs/day: 0.00    Average packs/day: 0.5 packs/day for 43.0 years (21.5 ttl pk-yrs)    Types: Cigarettes    Start date: 10/25/1975    Quit date: 10/24/2018    Years since quitting: 5.0   Smokeless tobacco: Never  Vaping Use   Vaping status: Never Used  Substance and Sexual Activity   Alcohol use: Never   Drug use: No   Sexual activity: Not Currently    Birth control/protection: Surgical    Comment: hyst  Other Topics Concern   Not on file  Social History Narrative   Lives with husband   Caffeine--  coffee 2 c daily   Social Drivers of Corporate investment banker Strain: Low Risk  (11/24/2018)   Overall Financial Resource Strain (CARDIA)    Difficulty of Paying Living Expenses: Not hard at all  Food Insecurity: No Food Insecurity (10/18/2022)   Hunger Vital Sign    Worried About Running Out of Food in the Last Year: Never true    Ran Out of Food in the Last Year: Never true  Transportation Needs: No Transportation Needs (10/18/2022)   PRAPARE - Administrator, Civil Service (Medical): No    Lack of Transportation (Non-Medical): No  Physical Activity: Inactive (11/24/2018)   Exercise Vital Sign    Days of Exercise per Week: 0 days    Minutes of Exercise per Session: 0 min  Stress: Stress Concern Present (11/24/2018)   Laura Abbott of Occupational Health - Occupational Stress Questionnaire    Feeling of Stress : Rather much  Social Connections: Socially Integrated (11/24/2018)   Social Connection and Isolation Panel [NHANES]    Frequency of Communication with Friends and Family: More than three times a week    Frequency of Social  Gatherings with Friends and Family: More than three times a week    Attends Religious Services: More than 4 times per year    Active Member of [employer] or Organizations: Yes    Attends Engineer, structural: More than 4 times per year    Marital Status: Married  Catering manager Violence: Not At Risk (10/18/2022)   Humiliation, Afraid, Rape, and Kick questionnaire    Fear of Current or Ex-Partner: No    Emotionally Abused: No    Physically Abused: No    Sexually Abused: No    Family History: Family History  Problem Relation Age of Onset   Lung cancer Mother 59       non smoker   Rheum arthritis Mother    Pancreatic cancer Father 25       d. 40   Leukemia Sister        dx in her 4s   Heart disease Sister    Colon cancer Sister        dx and died in her 22s   Heart disease Sister    Lung cancer Maternal Aunt        non-smoker   Heart disease Sister    Breast cancer Sister        dx early 37s   Heart disease Brother    Cirrhosis Brother        d. 65   Aneurysm Brother        d. 4s   Fibromyalgia Daughter    Kidney cancer Nephew 80   Kidney cancer Nephew        dx in 30s; great nephew   Leukemia Nephew    Liver disease Nephew    Other Niece        white matter on brain; dx late 65s   Leukemia Nephew     Current Medications:  Current Outpatient Medications:    albuterol (PROVENTIL HFA;VENTOLIN HFA) 108 (90 BASE) MCG/ACT inhaler, Inhale 2 puffs into the lungs 3 (three) times daily. Also use the inhaler every 2 hours if needed for worsening shortness of breath and wheezing., Disp: 1 Inhaler, Rfl: 3   albuterol (PROVENTIL) (2.5 MG/3ML) 0.083% nebulizer solution, Take 3 mLs (2.5 mg total) by nebulization every 2 (two) hours as needed for shortness of breath., Disp: 75 mL, Rfl: 12  aspirin EC 81 MG tablet, Take 1 tablet (81 mg total) by mouth daily. Swallow whole., Disp: 90 tablet, Rfl: 3   azelastine (ASTELIN) 0.1 % nasal spray, Place into both nostrils., Disp: ,  Rfl:    Calcium 600-200 MG-UNIT per tablet, Take 1 tablet by mouth daily., Disp: , Rfl:    clonazePAM (KLONOPIN) 0.5 MG tablet, Take 0.5 mg by mouth at bedtime., Disp: , Rfl:    dextromethorphan-guaiFENesin (MUCINEX DM) 30-600 MG 12hr tablet, Take 1 tablet by mouth 2 (two) times daily., Disp: 30 tablet, Rfl: 0   FLUoxetine (PROZAC) 40 MG capsule, Take 40 mg by mouth daily., Disp: , Rfl:    furosemide (LASIX) 20 MG tablet, Take 20 mg by mouth as needed., Disp: , Rfl:    gabapentin (NEURONTIN) 300 MG capsule, Take 300 mg by mouth 2 (two) times daily., Disp: , Rfl:    hydrochlorothiazide (HYDRODIURIL) 12.5 MG tablet, Take 1 tablet (12.5 mg total) by mouth daily., Disp: 30 tablet, Rfl: 11   meloxicam (MOBIC) 7.5 MG tablet, Take 1 tablet (7.5 mg total) by mouth daily as needed for pain., Disp: , Rfl:    metoprolol succinate (TOPROL-XL) 50 MG 24 hr tablet, Take 50 mg by mouth daily., Disp: , Rfl:    olmesartan (BENICAR) 40 MG tablet, take 1 tablet by mouth once daily., Disp: 30 tablet, Rfl: 0   omeprazole (PRILOSEC) 40 MG capsule, Take 40 mg by mouth daily. , Disp: , Rfl:    rosuvastatin (CRESTOR) 20 MG tablet, Take 20 mg by mouth at bedtime., Disp: , Rfl:    tizanidine (ZANAFLEX) 2 MG capsule, Take 1 capsule (2 mg total) by mouth daily as needed for muscle spasms., Disp: , Rfl:    budesonide (PULMICORT) 0.25 MG/2ML nebulizer solution, Take 2 mLs (0.25 mg total) by nebulization 2 (two) times daily., Disp: 60 mL, Rfl: 2   nitroGLYCERIN (NITROSTAT) 0.4 MG SL tablet, Place 1 tablet (0.4 mg total) under the tongue every 5 (five) minutes as needed for chest pain., Disp: 25 tablet, Rfl: 3   Allergies: No Known Allergies  REVIEW OF SYSTEMS:   Review of Systems  Constitutional:  Negative for chills, fatigue and fever.  HENT:   Negative for lump/mass, mouth sores, nosebleeds, sore throat and trouble swallowing.   Eyes:  Negative for eye problems.  Respiratory:  Positive for shortness of breath.    Cardiovascular:  Negative for chest pain, leg swelling and palpitations.  Gastrointestinal:  Negative for abdominal pain, constipation, diarrhea, nausea and vomiting.  Genitourinary:  Negative for bladder incontinence, difficulty urinating, dysuria, frequency, hematuria and nocturia.   Musculoskeletal:  Positive for back pain. Negative for arthralgias, flank pain, myalgias and neck pain.  Skin:  Negative for itching and rash.  Neurological:  Positive for dizziness and numbness. Negative for headaches.  Hematological:  Does not bruise/bleed easily.  Psychiatric/Behavioral:  Positive for depression. Negative for sleep disturbance and suicidal ideas. The patient is nervous/anxious.   All other systems reviewed and are negative.    VITALS:   Blood pressure (!) 116/54, pulse 70, temperature 98.4 F (36.9 C), temperature source Oral, resp. rate 19, weight 204 lb 14.4 oz (92.9 kg), SpO2 94%.  Wt Readings from Last 3 Encounters:  11/25/23 204 lb 14.4 oz (92.9 kg)  08/27/23 205 lb 6.4 oz (93.2 kg)  05/15/23 202 lb (91.6 kg)    Body mass index is 36.3 kg/m.  Performance status (ECOG): 1 - Symptomatic but completely ambulatory  PHYSICAL EXAM:  Physical Exam Vitals and nursing note reviewed. Exam conducted with a chaperone present.  Constitutional:      Appearance: Normal appearance.  Cardiovascular:     Rate and Rhythm: Normal rate and regular rhythm.     Pulses: Normal pulses.     Heart sounds: Normal heart sounds.  Pulmonary:     Effort: Pulmonary effort is normal.     Breath sounds: Normal breath sounds.  Abdominal:     Palpations: Abdomen is soft. There is no hepatomegaly, splenomegaly or mass.     Tenderness: There is no abdominal tenderness.  Musculoskeletal:     Right lower leg: No edema.     Left lower leg: No edema.  Lymphadenopathy:     Cervical: No cervical adenopathy.     Right cervical: No superficial, deep or posterior cervical adenopathy.    Left cervical: No  superficial, deep or posterior cervical adenopathy.     Upper Body:     Right upper body: No supraclavicular or axillary adenopathy.     Left upper body: No supraclavicular or axillary adenopathy.  Neurological:     General: No focal deficit present.     Mental Status: She is alert and oriented to person, place, and time.  Psychiatric:        Mood and Affect: Mood normal.        Behavior: Behavior normal.     LABS:      Latest Ref Rng & Units 11/18/2023    9:06 AM 05/08/2023    9:59 AM 11/01/2022    1:39 PM  CBC  WBC 4.0 - 10.5 K/uL 8.1  8.4  12.6   Hemoglobin 12.0 - 15.0 g/dL 82.9  56.2  13.0   Hematocrit 36.0 - 46.0 % 38.4  38.4  41.2   Platelets 150 - 400 K/uL 338  340  322       Latest Ref Rng & Units 11/18/2023    9:06 AM 05/08/2023    9:59 AM 11/01/2022    1:39 PM  CMP  Glucose 70 - 99 mg/dL 865  784  696   BUN 8 - 23 mg/dL 22  19  13    Creatinine 0.44 - 1.00 mg/dL 2.95  2.84  1.32   Sodium 135 - 145 mmol/L 136  136  138   Potassium 3.5 - 5.1 mmol/L 3.5  3.6  4.1   Chloride 98 - 111 mmol/L 105  102  103   CO2 22 - 32 mmol/L 26  25  28    Calcium 8.9 - 10.3 mg/dL 9.0  8.9  9.4   Total Protein 6.5 - 8.1 g/dL 7.3  7.3  7.1   Total Bilirubin <1.2 mg/dL 0.9  0.6  1.0   Alkaline Phos 38 - 126 U/L 64  71  75   AST 15 - 41 U/L 31  26  22    ALT 0 - 44 U/L 20  20  25       No results found for: "CEA1", "CEA" / No results found for: "CEA1", "CEA" No results found for: "PSA1" No results found for: "GMW102" No results found for: "CAN125"  No results found for: "TOTALPROTELP", "ALBUMINELP", "A1GS", "A2GS", "BETS", "BETA2SER", "GAMS", "MSPIKE", "SPEI" No results found for: "TIBC", "FERRITIN", "IRONPCTSAT" No results found for: "LDH"   STUDIES:   CT Chest Wo Contrast Result Date: 11/25/2023 CLINICAL DATA:  Non-small cell lung cancer, left upper lobectomy in 2020. * Tracking Code: BO * EXAM: CT CHEST WITHOUT CONTRAST TECHNIQUE:  Multidetector CT imaging of the chest was  performed following the standard protocol without IV contrast. RADIATION DOSE REDUCTION: This exam was performed according to the departmental dose-optimization program which includes automated exposure control, adjustment of the mA and/or kV according to patient size and/or use of iterative reconstruction technique. COMPARISON:  05/08/2023 FINDINGS: Cardiovascular: Coronary, aortic arch, and branch vessel atherosclerotic vascular disease. Calcifications of the mitral and aortic valve. Right Port-A-Cath tip: SVC. Borderline cardiomegaly. Mediastinum/Nodes: Unremarkable Lungs/Pleura: Mild biapical pleuroparenchymal scarring. Stable bandlike presumed scarring in the right lower lobe with associated volume loss. Left upper lobectomy. Upper Abdomen: Slightly nodular contour the liver, cannot exclude cirrhosis. Advanced atherosclerosis including the celiac trunk and superior mesenteric artery. Cholecystectomy. Musculoskeletal: Thoracic spondylosis and lower thoracic kyphosis. IMPRESSION: 1. No findings of active neoplastic disease in the chest. 2. Stable bandlike presumed scarring in the right lower lobe with associated volume loss. 3. Slightly nodular contour the liver, cannot exclude cirrhosis. 4. Coronary, aortic arch, and branch vessel atherosclerotic vascular disease. 5. Calcifications of the mitral and aortic valve. 6. Thoracic spondylosis and lower thoracic kyphosis. Aortic Atherosclerosis (ICD10-I70.0). Electronically Signed   By: Gaylyn Rong M.D.   On: 11/25/2023 10:10

## 2023-12-12 ENCOUNTER — Other Ambulatory Visit: Payer: Self-pay | Admitting: Internal Medicine

## 2023-12-12 DIAGNOSIS — J449 Chronic obstructive pulmonary disease, unspecified: Secondary | ICD-10-CM

## 2024-01-02 ENCOUNTER — Encounter (HOSPITAL_COMMUNITY): Payer: Self-pay

## 2024-01-02 ENCOUNTER — Emergency Department (HOSPITAL_COMMUNITY): Payer: Medicare HMO

## 2024-01-02 ENCOUNTER — Other Ambulatory Visit: Payer: Self-pay

## 2024-01-02 ENCOUNTER — Inpatient Hospital Stay (HOSPITAL_COMMUNITY)
Admission: EM | Admit: 2024-01-02 | Discharge: 2024-01-04 | DRG: 193 | Disposition: A | Discharge status: Home / Self Care | Payer: Medicare HMO | Attending: Internal Medicine | Admitting: Internal Medicine

## 2024-01-02 DIAGNOSIS — Z8261 Family history of arthritis: Secondary | ICD-10-CM

## 2024-01-02 DIAGNOSIS — Z90710 Acquired absence of both cervix and uterus: Secondary | ICD-10-CM

## 2024-01-02 DIAGNOSIS — Z87891 Personal history of nicotine dependence: Secondary | ICD-10-CM

## 2024-01-02 DIAGNOSIS — Z7982 Long term (current) use of aspirin: Secondary | ICD-10-CM | POA: Diagnosis not present

## 2024-01-02 DIAGNOSIS — Z801 Family history of malignant neoplasm of trachea, bronchus and lung: Secondary | ICD-10-CM

## 2024-01-02 DIAGNOSIS — Z9221 Personal history of antineoplastic chemotherapy: Secondary | ICD-10-CM

## 2024-01-02 DIAGNOSIS — C3412 Malignant neoplasm of upper lobe, left bronchus or lung: Secondary | ICD-10-CM

## 2024-01-02 DIAGNOSIS — Z7951 Long term (current) use of inhaled steroids: Secondary | ICD-10-CM

## 2024-01-02 DIAGNOSIS — Z8249 Family history of ischemic heart disease and other diseases of the circulatory system: Secondary | ICD-10-CM

## 2024-01-02 DIAGNOSIS — J441 Chronic obstructive pulmonary disease with (acute) exacerbation: Secondary | ICD-10-CM | POA: Diagnosis present

## 2024-01-02 DIAGNOSIS — Z79899 Other long term (current) drug therapy: Secondary | ICD-10-CM | POA: Diagnosis not present

## 2024-01-02 DIAGNOSIS — Z6836 Body mass index (BMI) 36.0-36.9, adult: Secondary | ICD-10-CM | POA: Diagnosis not present

## 2024-01-02 DIAGNOSIS — Z800 Family history of malignant neoplasm of digestive organs: Secondary | ICD-10-CM

## 2024-01-02 DIAGNOSIS — R0602 Shortness of breath: Secondary | ICD-10-CM | POA: Diagnosis not present

## 2024-01-02 DIAGNOSIS — Z1152 Encounter for screening for COVID-19: Secondary | ICD-10-CM

## 2024-01-02 DIAGNOSIS — I10 Essential (primary) hypertension: Secondary | ICD-10-CM | POA: Diagnosis present

## 2024-01-02 DIAGNOSIS — K219 Gastro-esophageal reflux disease without esophagitis: Secondary | ICD-10-CM | POA: Diagnosis present

## 2024-01-02 DIAGNOSIS — E66812 Obesity, class 2: Secondary | ICD-10-CM | POA: Diagnosis present

## 2024-01-02 DIAGNOSIS — J9601 Acute respiratory failure with hypoxia: Secondary | ICD-10-CM

## 2024-01-02 DIAGNOSIS — Z902 Acquired absence of lung [part of]: Secondary | ICD-10-CM

## 2024-01-02 DIAGNOSIS — M810 Age-related osteoporosis without current pathological fracture: Secondary | ICD-10-CM | POA: Diagnosis present

## 2024-01-02 DIAGNOSIS — N390 Urinary tract infection, site not specified: Secondary | ICD-10-CM | POA: Diagnosis present

## 2024-01-02 DIAGNOSIS — J101 Influenza due to other identified influenza virus with other respiratory manifestations: Secondary | ICD-10-CM | POA: Diagnosis not present

## 2024-01-02 DIAGNOSIS — Z803 Family history of malignant neoplasm of breast: Secondary | ICD-10-CM

## 2024-01-02 DIAGNOSIS — Z87442 Personal history of urinary calculi: Secondary | ICD-10-CM | POA: Diagnosis not present

## 2024-01-02 DIAGNOSIS — J439 Emphysema, unspecified: Secondary | ICD-10-CM | POA: Diagnosis present

## 2024-01-02 DIAGNOSIS — Z806 Family history of leukemia: Secondary | ICD-10-CM

## 2024-01-02 DIAGNOSIS — Z853 Personal history of malignant neoplasm of breast: Secondary | ICD-10-CM

## 2024-01-02 DIAGNOSIS — Z85118 Personal history of other malignant neoplasm of bronchus and lung: Secondary | ICD-10-CM

## 2024-01-02 DIAGNOSIS — E7800 Pure hypercholesterolemia, unspecified: Secondary | ICD-10-CM | POA: Diagnosis present

## 2024-01-02 DIAGNOSIS — Z923 Personal history of irradiation: Secondary | ICD-10-CM

## 2024-01-02 DIAGNOSIS — C3490 Malignant neoplasm of unspecified part of unspecified bronchus or lung: Secondary | ICD-10-CM | POA: Diagnosis present

## 2024-01-02 DIAGNOSIS — Z8701 Personal history of pneumonia (recurrent): Secondary | ICD-10-CM | POA: Diagnosis not present

## 2024-01-02 DIAGNOSIS — Z8051 Family history of malignant neoplasm of kidney: Secondary | ICD-10-CM

## 2024-01-02 DIAGNOSIS — B9620 Unspecified Escherichia coli [E. coli] as the cause of diseases classified elsewhere: Secondary | ICD-10-CM | POA: Diagnosis present

## 2024-01-02 DIAGNOSIS — J449 Chronic obstructive pulmonary disease, unspecified: Secondary | ICD-10-CM | POA: Diagnosis present

## 2024-01-02 LAB — BASIC METABOLIC PANEL
Anion gap: 11 (ref 5–15)
BUN: 10 mg/dL (ref 8–23)
CO2: 26 mmol/L (ref 22–32)
Calcium: 9.1 mg/dL (ref 8.9–10.3)
Chloride: 99 mmol/L (ref 98–111)
Creatinine, Ser: 0.55 mg/dL (ref 0.44–1.00)
GFR, Estimated: >60 mL/min (ref >60)
Glucose, Bld: 103 mg/dL — ABNORMAL HIGH (ref 70–99)
Potassium: 3.5 mmol/L (ref 3.5–5.1)
Sodium: 136 mmol/L (ref 135–145)

## 2024-01-02 LAB — URINALYSIS, ROUTINE W REFLEX MICROSCOPIC
Bilirubin Urine: NEGATIVE
Glucose, UA: NEGATIVE mg/dL
Ketones, ur: NEGATIVE mg/dL
Leukocytes,Ua: NEGATIVE
Nitrite: POSITIVE — AB
Protein, ur: 30 mg/dL — AB
Specific Gravity, Urine: 1.012 (ref 1.005–1.030)
pH: 7 (ref 5.0–8.0)

## 2024-01-02 LAB — RESP PANEL BY RT-PCR (RSV, FLU A&B, COVID)  RVPGX2
Influenza A by PCR: POSITIVE — AB
Influenza B by PCR: NEGATIVE
Resp Syncytial Virus by PCR: NEGATIVE
SARS Coronavirus 2 by RT PCR: NEGATIVE

## 2024-01-02 LAB — CBC WITH DIFFERENTIAL/PLATELET
Abs Immature Granulocytes: 0.05 10*3/uL (ref 0.00–0.07)
Basophils Absolute: 0.1 10*3/uL (ref 0.0–0.1)
Basophils Relative: 1 %
Eosinophils Absolute: 0.2 10*3/uL (ref 0.0–0.5)
Eosinophils Relative: 2 %
HCT: 38.4 % (ref 36.0–46.0)
Hemoglobin: 12.3 g/dL (ref 12.0–15.0)
Immature Granulocytes: 1 %
Lymphocytes Relative: 35 %
Lymphs Abs: 2.9 10*3/uL (ref 0.7–4.0)
MCH: 29.1 pg (ref 26.0–34.0)
MCHC: 32 g/dL (ref 30.0–36.0)
MCV: 91 fL (ref 80.0–100.0)
Monocytes Absolute: 0.6 10*3/uL (ref 0.1–1.0)
Monocytes Relative: 7 %
Neutro Abs: 4.6 10*3/uL (ref 1.7–7.7)
Neutrophils Relative %: 54 %
Platelets: 317 10*3/uL (ref 150–400)
RBC: 4.22 MIL/uL (ref 3.87–5.11)
RDW: 14.2 % (ref 11.5–15.5)
WBC: 8.3 10*3/uL (ref 4.0–10.5)
nRBC: 0 % (ref 0.0–0.2)

## 2024-01-02 MED ORDER — ACETAMINOPHEN 500 MG PO TABS
1000.0000 mg | ORAL_TABLET | Freq: Once | ORAL | Status: AC
  Administered 2024-01-02: 1000 mg via ORAL
  Filled 2024-01-02: qty 2

## 2024-01-02 MED ORDER — CLONAZEPAM 0.5 MG PO TABS
0.5000 mg | ORAL_TABLET | Freq: Every day | ORAL | Status: DC
  Administered 2024-01-02 – 2024-01-03 (×2): 0.5 mg via ORAL
  Filled 2024-01-02 (×2): qty 1

## 2024-01-02 MED ORDER — POLYETHYLENE GLYCOL 3350 17 G PO PACK: 17.0000 g | PACK | Freq: Every day | ORAL | Status: DC | PRN

## 2024-01-02 MED ORDER — IPRATROPIUM-ALBUTEROL 0.5-2.5 (3) MG/3ML IN SOLN
3.0000 mL | Freq: Three times a day (TID) | RESPIRATORY_TRACT | Status: DC
  Administered 2024-01-03: 3 mL via RESPIRATORY_TRACT
  Filled 2024-01-02: qty 3

## 2024-01-02 MED ORDER — ACETAMINOPHEN 650 MG RE SUPP: 650.0000 mg | Freq: Four times a day (QID) | RECTAL | Status: DC | PRN

## 2024-01-02 MED ORDER — ACETAMINOPHEN 325 MG PO TABS
650.0000 mg | ORAL_TABLET | Freq: Four times a day (QID) | ORAL | Status: DC | PRN
  Administered 2024-01-03 (×2): 650 mg via ORAL
  Filled 2024-01-02 (×2): qty 2

## 2024-01-02 MED ORDER — TIZANIDINE HCL 2 MG PO TABS
2.0000 mg | ORAL_TABLET | Freq: Two times a day (BID) | ORAL | Status: DC
  Administered 2024-01-02 – 2024-01-04 (×4): 2 mg via ORAL
  Filled 2024-01-02 (×4): qty 1

## 2024-01-02 MED ORDER — METHYLPREDNISOLONE SODIUM SUCC 125 MG IJ SOLR
60.0000 mg | Freq: Two times a day (BID) | INTRAMUSCULAR | Status: AC
  Administered 2024-01-03 (×2): 60 mg via INTRAVENOUS
  Filled 2024-01-02 (×2): qty 2

## 2024-01-02 MED ORDER — PREDNISONE 20 MG PO TABS
40.0000 mg | ORAL_TABLET | Freq: Once | ORAL | Status: AC
  Administered 2024-01-02: 40 mg via ORAL
  Filled 2024-01-02: qty 2

## 2024-01-02 MED ORDER — FLUOXETINE HCL 20 MG PO CAPS
40.0000 mg | ORAL_CAPSULE | Freq: Every day | ORAL | Status: DC
  Administered 2024-01-03 – 2024-01-04 (×2): 40 mg via ORAL
  Filled 2024-01-02 (×3): qty 2

## 2024-01-02 MED ORDER — METOPROLOL SUCCINATE ER 50 MG PO TB24
50.0000 mg | ORAL_TABLET | Freq: Every day | ORAL | Status: DC
  Administered 2024-01-02 – 2024-01-04 (×3): 50 mg via ORAL
  Filled 2024-01-02 (×3): qty 1

## 2024-01-02 MED ORDER — ASPIRIN 81 MG PO TBEC
81.0000 mg | DELAYED_RELEASE_TABLET | Freq: Every day | ORAL | Status: DC
  Administered 2024-01-02 – 2024-01-04 (×3): 81 mg via ORAL
  Filled 2024-01-02 (×3): qty 1

## 2024-01-02 MED ORDER — ROSUVASTATIN CALCIUM 20 MG PO TABS
20.0000 mg | ORAL_TABLET | Freq: Every day | ORAL | Status: DC
  Administered 2024-01-02 – 2024-01-03 (×2): 20 mg via ORAL
  Filled 2024-01-02 (×2): qty 1

## 2024-01-02 MED ORDER — GUAIFENESIN-DM 100-10 MG/5ML PO SYRP
15.0000 mL | ORAL_SOLUTION | Freq: Three times a day (TID) | ORAL | Status: AC
  Administered 2024-01-02 – 2024-01-03 (×2): 15 mL via ORAL
  Filled 2024-01-02 (×2): qty 15

## 2024-01-02 MED ORDER — ONDANSETRON HCL 4 MG/2ML IJ SOLN: 4.0000 mg | Freq: Four times a day (QID) | INTRAMUSCULAR | Status: DC | PRN

## 2024-01-02 MED ORDER — IPRATROPIUM-ALBUTEROL 0.5-2.5 (3) MG/3ML IN SOLN: 3.0000 mL | RESPIRATORY_TRACT | Status: DC | PRN

## 2024-01-02 MED ORDER — OSELTAMIVIR PHOSPHATE 75 MG PO CAPS
75.0000 mg | ORAL_CAPSULE | Freq: Two times a day (BID) | ORAL | Status: DC
  Administered 2024-01-02 – 2024-01-04 (×4): 75 mg via ORAL
  Filled 2024-01-02 (×4): qty 1

## 2024-01-02 MED ORDER — IRBESARTAN 150 MG PO TABS
300.0000 mg | ORAL_TABLET | Freq: Every day | ORAL | Status: DC
  Administered 2024-01-03 – 2024-01-04 (×2): 300 mg via ORAL
  Filled 2024-01-02 (×2): qty 2

## 2024-01-02 MED ORDER — IRBESARTAN 150 MG PO TABS
300.0000 mg | ORAL_TABLET | Freq: Once | ORAL | Status: AC
  Administered 2024-01-02: 300 mg via ORAL
  Filled 2024-01-02: qty 2

## 2024-01-02 MED ORDER — GABAPENTIN 300 MG PO CAPS
300.0000 mg | ORAL_CAPSULE | Freq: Two times a day (BID) | ORAL | Status: DC
  Administered 2024-01-02 – 2024-01-04 (×4): 300 mg via ORAL
  Filled 2024-01-02 (×4): qty 1

## 2024-01-02 MED ORDER — IPRATROPIUM-ALBUTEROL 0.5-2.5 (3) MG/3ML IN SOLN
RESPIRATORY_TRACT | Status: AC
  Filled 2024-01-02: qty 3

## 2024-01-02 MED ORDER — METHYLPREDNISOLONE SODIUM SUCC 125 MG IJ SOLR
80.0000 mg | Freq: Once | INTRAMUSCULAR | Status: AC
  Administered 2024-01-02: 80 mg via INTRAVENOUS
  Filled 2024-01-02: qty 2

## 2024-01-02 MED ORDER — IPRATROPIUM-ALBUTEROL 0.5-2.5 (3) MG/3ML IN SOLN
3.0000 mL | Freq: Once | RESPIRATORY_TRACT | Status: AC
Start: 2024-01-02 — End: 2024-01-02
  Administered 2024-01-02: 3 mL via RESPIRATORY_TRACT

## 2024-01-02 MED ORDER — ONDANSETRON HCL 4 MG PO TABS: 4.0000 mg | ORAL_TABLET | Freq: Four times a day (QID) | ORAL | Status: DC | PRN

## 2024-01-02 MED ORDER — PANTOPRAZOLE SODIUM 40 MG PO TBEC
40.0000 mg | DELAYED_RELEASE_TABLET | Freq: Every day | ORAL | Status: DC
  Administered 2024-01-02 – 2024-01-04 (×3): 40 mg via ORAL
  Filled 2024-01-02 (×3): qty 1

## 2024-01-02 MED ORDER — POTASSIUM CHLORIDE CRYS ER 20 MEQ PO TBCR
40.0000 meq | EXTENDED_RELEASE_TABLET | Freq: Once | ORAL | Status: AC
  Administered 2024-01-02: 40 meq via ORAL
  Filled 2024-01-02: qty 2

## 2024-01-02 MED ORDER — SODIUM CHLORIDE 0.9 % IV SOLN
1.0000 g | INTRAVENOUS | Status: DC
  Administered 2024-01-02 – 2024-01-03 (×2): 1 g via INTRAVENOUS
  Filled 2024-01-02 (×2): qty 10

## 2024-01-02 MED ORDER — ENOXAPARIN SODIUM 40 MG/0.4ML IJ SOSY
40.0000 mg | PREFILLED_SYRINGE | INTRAMUSCULAR | Status: DC
  Administered 2024-01-03 – 2024-01-04 (×2): 40 mg via SUBCUTANEOUS
  Filled 2024-01-02 (×2): qty 0.4

## 2024-01-02 MED ORDER — BUDESONIDE 0.25 MG/2ML IN SUSP
0.2500 mg | Freq: Two times a day (BID) | RESPIRATORY_TRACT | Status: DC
Start: 2024-01-02 — End: 2024-01-03
  Administered 2024-01-02: 0.25 mg via RESPIRATORY_TRACT
  Filled 2024-01-02: qty 2

## 2024-01-02 MED ORDER — ALBUTEROL SULFATE HFA 108 (90 BASE) MCG/ACT IN AERS: 2.0000 | INHALATION_SPRAY | RESPIRATORY_TRACT | Status: DC | PRN

## 2024-01-02 MED ORDER — HYDROCHLOROTHIAZIDE 12.5 MG PO TABS
12.5000 mg | ORAL_TABLET | Freq: Once | ORAL | Status: AC
  Administered 2024-01-02: 12.5 mg via ORAL
  Filled 2024-01-02: qty 1

## 2024-01-02 MED ORDER — PREDNISONE 20 MG PO TABS
40.0000 mg | ORAL_TABLET | Freq: Every day | ORAL | Status: DC
  Administered 2024-01-04: 40 mg via ORAL
  Filled 2024-01-02 (×2): qty 2

## 2024-01-02 MED ORDER — IPRATROPIUM-ALBUTEROL 0.5-2.5 (3) MG/3ML IN SOLN
3.0000 mL | Freq: Once | RESPIRATORY_TRACT | Status: AC
  Administered 2024-01-02: 3 mL via RESPIRATORY_TRACT
  Filled 2024-01-02: qty 3

## 2024-01-02 MED ORDER — HYDROCHLOROTHIAZIDE 12.5 MG PO TABS
12.5000 mg | ORAL_TABLET | Freq: Every day | ORAL | Status: DC
  Administered 2024-01-03 – 2024-01-04 (×2): 12.5 mg via ORAL
  Filled 2024-01-02 (×2): qty 1

## 2024-01-02 NOTE — ED Notes (Signed)
 Laura Abbott's O2 sat dropped to 88% while ambulating to the restroom. Placed her on 2L O2 via [place].

## 2024-01-02 NOTE — Assessment & Plan Note (Signed)
 Status post LUL lobectomy and chemotherapy in 2020.  Follows with Dr. Cheree Cords.  Under surveillance.

## 2024-01-02 NOTE — ED Notes (Signed)
 This nurse walked her a shorter distance (than previous) by a few feet, she seemed a little tired. She started out at 89% RA and walked the whole way at 88-87% when I took it off and sat her on the bed and put her on the monitor O2 she was at 86%

## 2024-01-02 NOTE — ED Triage Notes (Signed)
 Pt arrived via POV c/o worsening SOB. Pt reports O2 Sats at home was 83% on room air. Pt reports symptoms have been on-going for a few days.

## 2024-01-02 NOTE — Assessment & Plan Note (Signed)
 Influenza infection likely provoking COPD exacerbation. -Tamiflu 

## 2024-01-02 NOTE — ED Provider Notes (Signed)
 [facility] EMERGENCY DEPARTMENT AT [facility] Provider Note   CSN: [id number] Arrival date & time: 01/02/24  1029     History  Chief Complaint  Patient presents with   Shortness of Breath    Laura Abbott is a 78 y.o. female.  She has PMH of asthma, COPD, lung cancer s/p resection and chemotherapy with no recurrence of disease.  She also has a history of high blood pressure and high cholesterol.  Presents ER complaining of cough, shortness of breath and wheezing, that this started about 5 days ago she was having some chills and bodyaches with it as well as having cough productive of white sputum.  She states she started becoming more short of breath last night and was trying her oxygen  which was going down into the 80s, 83 as low as.  She is not on home oxygen .  Comes in the ER for further evaluation, states at this time she feels somewhat better and is asking to go home.  Denies any fevers, vomiting, abdominal pain, urinary symptoms or other complaints.  Does note that she did not take her usual home medications today, notes her blood pressure was elevated at triage but denies chest pain, dizziness, numbness tingling or weakness   Shortness of Breath      Home Medications Prior to Admission medications   Medication Sig Start Date End Date Taking? Authorizing Provider  tiZANidine  (ZANAFLEX ) 2 MG tablet Take 2 mg by mouth 2 (two) times daily. 11/09/23  Yes [provider]  albuterol  (PROVENTIL  HFA;VENTOLIN  HFA) 108 (90 BASE) MCG/ACT inhaler Inhale 2 puffs into the lungs 3 (three) times daily. Also use the inhaler every 2 hours if needed for worsening shortness of breath and wheezing. 01/05/14   Gasper Aquas, MD  albuterol  (PROVENTIL ) (2.5 MG/3ML) 0.083% nebulizer solution Take 3 mLs (2.5 mg total) by nebulization every 2 (two) hours as needed for shortness of breath. 10/20/22   Antoinette Doe, MD  aspirin  EC 81 MG tablet Take 1 tablet (81 mg total) by mouth daily.  Swallow whole. 12/28/21   Strader, Laymon HERO, PA-C  azelastine (ASTELIN) 0.1 % nasal spray Place into both nostrils. 07/23/23   [provider]  budesonide  (PULMICORT ) 0.25 MG/2ML nebulizer solution Take 2 mLs (0.25 mg total) by nebulization 2 (two) times daily. 10/20/22 10/20/23  Antoinette Doe, MD  Calcium  600-200 MG-UNIT per tablet Take 1 tablet by mouth daily.    [provider]  clonazePAM  (KLONOPIN ) 0.5 MG tablet Take 0.5 mg by mouth at bedtime. 09/30/18   [provider]  dextromethorphan -guaiFENesin  (MUCINEX  DM) 30-600 MG 12hr tablet Take 1 tablet by mouth 2 (two) times daily. 10/20/22   Antoinette Doe, MD  FLUoxetine  (PROZAC ) 40 MG capsule Take 40 mg by mouth daily.    [provider]  furosemide  (LASIX ) 20 MG tablet Take 20 mg by mouth as needed. 09/06/20   [provider]  gabapentin  (NEURONTIN ) 300 MG capsule Take 300 mg by mouth 2 (two) times daily. 09/11/21   [provider]  hydrochlorothiazide  (HYDRODIURIL ) 12.5 MG tablet Take 1 tablet (12.5 mg total) by mouth daily. 12/10/22   Darlean Ozell NOVAK, MD  meloxicam  (MOBIC ) 7.5 MG tablet Take 1 tablet (7.5 mg total) by mouth daily as needed for pain. 10/20/22   Memon, Jehanzeb, MD  metoprolol  succinate (TOPROL -XL) 50 MG 24 hr tablet Take 50 mg by mouth daily. 08/28/22   [provider]  nitroGLYCERIN  (NITROSTAT ) 0.4 MG SL tablet Place 1 tablet (  0.4 mg total) under the tongue every 5 (five) minutes as needed for chest pain. 12/28/21 08/27/23  Strader, Laymon HERO, PA-C  olmesartan  (BENICAR ) 40 MG tablet take 1 tablet by mouth once daily. 11/07/23   Darlean Ozell NOVAK, MD  omeprazole (PRILOSEC) 40 MG capsule Take 40 mg by mouth daily.  09/03/13   [provider]  rosuvastatin  (CRESTOR ) 20 MG tablet Take 20 mg by mouth at bedtime. 11/09/23   [provider]      Allergies    Patient has no known allergies.    Review of Systems   Review of Systems  Respiratory:  Positive  for shortness of breath.     Physical Exam Updated Vital Signs BP (!) 177/72   Pulse 99   Temp 98 F (36.7 C) (Oral)   Resp (!) 22   Ht 5' 3 (1.6 m)   Wt 93 kg   SpO2 (!) 89% Comment: RA when walking  BMI 36.32 kg/m  Physical Exam Vitals and nursing note reviewed.  Constitutional:      General: She is not in acute distress.    Appearance: She is well-developed.  HENT:     Head: Normocephalic and atraumatic.  Eyes:     Extraocular Movements: Extraocular movements intact.     Conjunctiva/sclera: Conjunctivae normal.     Pupils: Pupils are equal, round, and reactive to light.  Cardiovascular:     Rate and Rhythm: Normal rate and regular rhythm.     Heart sounds: No murmur heard. Pulmonary:     Effort: Pulmonary effort is normal. Tachypnea present. No respiratory distress.     Breath sounds: Examination of the right-upper field reveals wheezing. Examination of the left-upper field reveals wheezing. Examination of the right-middle field reveals wheezing. Examination of the left-middle field reveals wheezing. Examination of the right-lower field reveals wheezing. Examination of the left-lower field reveals wheezing. Wheezing present.  Abdominal:     Palpations: Abdomen is soft.     Tenderness: There is no abdominal tenderness.  Musculoskeletal:        General: No swelling.     Cervical back: Neck supple.     Right lower leg: No tenderness. No edema.     Left lower leg: No tenderness. No edema.  Skin:    General: Skin is warm and dry.     Capillary Refill: Capillary refill takes less than 2 seconds.  Neurological:     General: No focal deficit present.     Mental Status: She is alert and oriented to person, place, and time.  Psychiatric:        Mood and Affect: Mood normal.     ED Results / Procedures / Treatments   Labs (all labs ordered are listed, but only abnormal results are displayed) Labs Reviewed  RESP PANEL BY RT-PCR (RSV, FLU A&B, COVID)  RVPGX2 - Abnormal;  Notable for the following components:      Result Value   Influenza A by PCR POSITIVE (*)    All other components within normal limits  URINALYSIS, ROUTINE W REFLEX MICROSCOPIC - Abnormal; Notable for the following components:   APPearance HAZY (*)    Hgb urine dipstick SMALL (*)    Protein, ur 30 (*)    Nitrite POSITIVE (*)    Bacteria, UA MANY (*)    All other components within normal limits  BASIC METABOLIC PANEL - Abnormal; Notable for the following components:   Glucose, Bld 103 (*)    All other components  within normal limits  CBC WITH DIFFERENTIAL/PLATELET    EKG EKG Interpretation Date/Time:  Thursday January 02 2024 11:25:22 EST Ventricular Rate:  73 PR Interval:  128 QRS Duration:  80 QT Interval:  418 QTC Calculation: 460 R Axis:   -29  Text Interpretation: Normal sinus rhythm Anterolateral infarct , age undetermined  no acute changes compared to 2023 Confirmed by Freddi Hamilton [phone]) on 01/02/2024 2:15:48 PM  Radiology DG Chest 2 View Result Date: 01/02/2024 CLINICAL DATA:  Shortness of breath EXAM: CHEST - 2 VIEW COMPARISON:  10/18/2022 FINDINGS: Right Port-A-Cath remains in place, unchanged. Postoperative changes on the left, stable. No acute confluent airspace opacities or effusions. Heart is normal size. No acute bony abnormality. IMPRESSION: Stable postoperative changes on the left. No active disease. Electronically Signed   By: Franky Crease M.D.   On: 01/02/2024 12:16    Procedures Procedures    Medications Ordered in ED Medications  albuterol  (VENTOLIN  HFA) 108 (90 Base) MCG/ACT inhaler 2 puff (has no administration in time range)  ipratropium-albuterol  (DUONEB) 0.5-2.5 (3) MG/3ML nebulizer solution ( Nebulization Not Given 01/02/24 1512)  acetaminophen  (TYLENOL ) tablet 1,000 mg (has no administration in time range)  irbesartan  (AVAPRO ) tablet 300 mg (has no administration in time range)  ipratropium-albuterol  (DUONEB) 0.5-2.5 (3) MG/3ML nebulizer  solution 3 mL (3 mLs Nebulization Given 01/02/24 1447)  predniSONE  (DELTASONE ) tablet 40 mg (40 mg Oral Given 01/02/24 1436)  hydrochlorothiazide  (HYDRODIURIL ) tablet 12.5 mg (12.5 mg Oral Given 01/02/24 1436)  ipratropium-albuterol  (DUONEB) 0.5-2.5 (3) MG/3ML nebulizer solution 3 mL (3 mLs Nebulization Given 01/02/24 1509)    ED Course/ Medical Decision Making/ A&P Clinical Course as of 01/02/24 1627  Thu Jan 02, 2024  1401 With history of COPD, having wheezing increased work of breathing that is mild in nature on exam, the same between 91 and 94% on room air in the ED.  Chest x-ray shows no pulmonary edema infiltrate, no calf swelling or tenderness.  She is mildly tachypneic.  She is also positive for flu A.  She states this is typical of her her COPD exacerbations usually get ends up going home with steroids, antibiotics and using her nebulizer.  Will give patient a nebulizer treatment and steroids in the ED and then reevaluate and have her walk on pulse ox [CB]    Clinical Course User Index [CB] Suellen Sherran LABOR, PA-C                                 Medical Decision Making This patient presents to the ED for concern of cough, shortness of breath hypoxia at home, this involves an extensive number of treatment options, and is a complaint that carries with it a high risk of complications and morbidity.  The differential diagnosis includes pneumonia, COPD exacerbation, influenza, COVID-19, ACS, PE other   Co morbidities that complicate the patient evaluation :   COPD, asthma   Additional history obtained:  Additional history obtained from EMR External records from outside source obtained and reviewed including prior notes, labs   Lab Tests:  I Ordered, and personally interpreted labs.  The pertinent results include: Positive for influenza A, CBC is normal, BMP normal,   UA does have positive nitrate and many bacteria with 6-10 white blood cells but also has 11-20 squamous epithelial cells  and patient has no symptoms   Imaging Studies ordered:  I ordered imaging studies including chest x-ray which shows  no edema or infiltrate I independently visualized and interpreted imaging within scope of identifying emergent findings  I agree with the radiologist interpretation   Cardiac Monitoring: / EKG:  The patient was maintained on a cardiac monitor.  I personally viewed and interpreted the cardiac monitored which showed an underlying rhythm of: Sinus rhythm   Consultations Obtained:  I requested consultation with the hospitalist, Dr. Pearlean.,  and discussed lab and imaging findings as well as pertinent plan - they recommend: Admission   Problem List / ED Course / Critical interventions / Medication management  Influenza A-patient sick for about 5 days, now having wheezing and increased shortness of breath likely having COPD exacerbation worsened by her recent viral illness, out of the window for Tamiflu , no pneumonia on chest x-ray she is not having fevers.  Patient initially went to go home so started with breathing treatments and steroids and she initially felt better but on ambulation got extremely dyspneic and desatted to low of 86% on room air and had to be put on nasal cannula oxygen  when sitting as she did not recover immediately.  Patient agreeable with admission at this time.  She does not have home oxygen . I ordered medication including DuoNeb for wheezing and shortness of breath Reevaluation of the patient after these medicines showed that the patient improved I have reviewed the patients home medicines and have made adjustments as needed       Amount and/or Complexity of Data Reviewed Labs: ordered. Radiology: ordered.  Risk OTC drugs. Prescription drug management. Decision regarding hospitalization.           Final Clinical Impression(s) / ED Diagnoses Final diagnoses:  COPD exacerbation (HCC)  Influenza A  Acute respiratory failure with  hypoxia [facility])    Rx / DC Orders ED Discharge Orders     None         Suellen Sherran LABOR, PA-C 01/02/24 1627    Freddi Hamilton, MD 01/03/24 [phone]

## 2024-01-02 NOTE — Assessment & Plan Note (Signed)
 O2 sats down to 86% on room air with ambulation.  Not on home O2.  Likely due to COPD exacerbation.

## 2024-01-02 NOTE — Assessment & Plan Note (Signed)
 Dyspnea, cough, expiratory wheezing on auscultation and hypoxia.  Chest x-ray clear.  Quit smoking cigarettes over/about 5 years ago.  UA with positive nitrites and many bacteria.  Afebrile without leukocytosis. -DuoNebs as needed and scheduled -Mucolytics, flutter valve -Solu-Medrol  80 mg x 1, continue 60 twice daily -IV ceftriaxone  for COPD exacerbation and possible UTI -Tamiflu  -Add on urine cultures

## 2024-01-02 NOTE — Assessment & Plan Note (Signed)
 Elevated systolic 160s to 409W. -Resume home HCTZ, olmesartan , metoprolol 

## 2024-01-02 NOTE — H&P (Signed)
 History and Physical    Laura Abbott FMW:[id number] DOB: 04-Nov-1946 DOA: 01/02/2024  PCP: Trudy Vaughn FALCON, MD   Patient coming from: Home  I have personally briefly reviewed patient's old medical records in [facility]  Chief Complaint: Difficulty breathing  HPI: Laura Abbott is a 78 y.o. female with medical history significant for COPD, hypertension, anxiety, lung cancer. Patient presented to the ED with complaints of difficulty breathing, wheezing, cough, chills and bodyaches, symptoms started 2/1, but significantly worsened over the past 4 days. No chest pain, no lower extremity swelling.  Quit smoking cigarettes over 5 years ago. She checked her O2 sats at home - it was down to 83% hence she came to the ED.  She is not on home O2. She denies pain with urination but endorses chronic urinary frequency.  ED Course: Temperature 98.  Respiratory rate 22.  Blood pressure systolic 160s to 829d.  O2 sats down to 86% on room air with ambulation. Chest x-ray negative for acute abnormality. UA with positive nitrites and many bacteria 6-20 WBC. EKG sinus rhythm, rate 73, QTc 460.  No significant change from prior. Patient given prednisone  40 mg, DuoNebs x 2, after which she was ambulated and became hypoxic. Hospitalization requested.  Review of Systems: As per HPI all other systems reviewed and negative.  Past Medical History:  Diagnosis Date   Anxiety    Arthritis    Asthma    Breast cancer (HCC)    breast, right DCIS   COPD (chronic obstructive pulmonary disease) (HCC)    COPD exacerbation (HCC) 01/03/2014   With hypoxia   Depression    Emphysema of lung (HCC)    Family history of breast cancer    Family history of colon cancer    Family history of kidney cancer    Family history of lung cancer    Family history of pancreatic cancer    GERD (gastroesophageal reflux disease)    History of kidney stones    Hyperlipidemia    Hypertension    Insomnia    Lung cancer (HCC)     Obesity 01/05/2014   Osteoporosis    Osteoporosis, unspecified 12/01/2013   Personal history of radiation therapy    rt breast   Pneumonia    x2   Tobacco abuse 12/01/2013   Ulnar nerve impingement, left     Past Surgical History:  Procedure Laterality Date   ABDOMINAL HYSTERECTOMY     BLADDER SURGERY     had tacked   BREAST LUMPECTOMY Right    BREAST LUMPECTOMY WITH NEEDLE LOCALIZATION AND AXILLARY SENTINEL LYMPH NODE BX Right 11/09/2013   Procedure: BREAST LUMPECTOMY WITH NEEDLE LOCALIZATION AND AXILLARY SENTINEL LYMPH NODE BIOPSY;  Surgeon: Vicenta DELENA Poli, MD;  Location: [facility] OR;  Service: General;  Laterality: Right;   BREAST SURGERY Right    lumpectomy   CATARACT EXTRACTION W/PHACO Right 06/15/2022   Procedure: CATARACT EXTRACTION PHACO AND INTRAOCULAR LENS PLACEMENT (IOC);  Surgeon: Harrie Agent, MD;  Location: [facility] ORS;  Service: Ophthalmology;  Laterality: Right;  CDE 10.73   CATARACT EXTRACTION W/PHACO Left 06/29/2022   Procedure: CATARACT EXTRACTION PHACO AND INTRAOCULAR LENS PLACEMENT (IOC);  Surgeon: Harrie Agent, MD;  Location: [facility] ORS;  Service: Ophthalmology;  Laterality: Left;  CDE: 11.37   CHOLECYSTECTOMY     NODE DISSECTION Left 12/15/2018   Procedure: NODE DISSECTION LEFT LUNG;  Surgeon: Kerrin Elspeth BROCKS, MD;  Location: [facility] OR;  Service: Thoracic;  Laterality: Left;   PORTACATH  PLACEMENT Right 01/19/2019   Procedure: INSERTION PORT-A-CATH;  Surgeon: Mavis Anes, MD;  Location: [facility] ORS;  Service: General;  Laterality: Right;   VIDEO ASSISTED THORACOSCOPY (VATS)/ LOBECTOMY Left 12/15/2018   Procedure: VIDEO ASSISTED THORACOSCOPY (VATS)/LEFT UPPER LOBECTOMY;  Surgeon: Kerrin Elspeth BROCKS, MD;  Location: [facility] OR;  Service: Thoracic;  Laterality: Left;     reports that she quit smoking about 5 years ago. Her smoking use included cigarettes. She started smoking about 48 years ago. She has a 21.5 pack-year smoking history. She has never used smokeless tobacco. She  reports that she does not drink alcohol  and does not use drugs.  No Known Allergies  Family History  Problem Relation Age of Onset   Lung cancer Mother 87       non smoker   Rheum arthritis Mother    Pancreatic cancer Father 82       d. 23   Leukemia Sister        dx in her 59s   Heart disease Sister    Colon cancer Sister        dx and died in her 18s   Heart disease Sister    Lung cancer Maternal Aunt        non-smoker   Heart disease Sister    Breast cancer Sister        dx early 70s   Heart disease Brother    Cirrhosis Brother        d. 55   Aneurysm Brother        d. 38s   Fibromyalgia Daughter    Kidney cancer Nephew 87   Kidney cancer Nephew        dx in 30s; great nephew   Leukemia Nephew    Liver disease Nephew    Other Niece        white matter on brain; dx late 73s   Leukemia Nephew     Prior to Admission medications   Medication Sig Start Date End Date Taking? Authorizing Provider  tiZANidine  (ZANAFLEX ) 2 MG tablet Take 2 mg by mouth 2 (two) times daily. 11/09/23  Yes [provider]  albuterol  (PROVENTIL  HFA;VENTOLIN  HFA) 108 (90 BASE) MCG/ACT inhaler Inhale 2 puffs into the lungs 3 (three) times daily. Also use the inhaler every 2 hours if needed for worsening shortness of breath and wheezing. 01/05/14   Gasper Aquas, MD  albuterol  (PROVENTIL ) (2.5 MG/3ML) 0.083% nebulizer solution Take 3 mLs (2.5 mg total) by nebulization every 2 (two) hours as needed for shortness of breath. 10/20/22   Antoinette Doe, MD  aspirin  EC 81 MG tablet Take 1 tablet (81 mg total) by mouth daily. Swallow whole. 12/28/21   Strader, Laymon HERO, PA-C  azelastine (ASTELIN) 0.1 % nasal spray Place into both nostrils. 07/23/23   [provider]  budesonide  (PULMICORT ) 0.25 MG/2ML nebulizer solution Take 2 mLs (0.25 mg total) by nebulization 2 (two) times daily. 10/20/22 10/20/23  Antoinette Doe, MD  Calcium  600-200 MG-UNIT per tablet Take 1 tablet by mouth daily.     [provider]  clonazePAM  (KLONOPIN ) 0.5 MG tablet Take 0.5 mg by mouth at bedtime. 09/30/18   [provider]  dextromethorphan -guaiFENesin  (MUCINEX  DM) 30-600 MG 12hr tablet Take 1 tablet by mouth 2 (two) times daily. 10/20/22   Antoinette Doe, MD  FLUoxetine  (PROZAC ) 40 MG capsule Take 40 mg by mouth daily.    [provider]  furosemide  (LASIX ) 20 MG tablet Take 20 mg by mouth as  needed. 09/06/20   [provider]  gabapentin  (NEURONTIN ) 300 MG capsule Take 300 mg by mouth 2 (two) times daily. 09/11/21   [provider]  hydrochlorothiazide  (HYDRODIURIL ) 12.5 MG tablet Take 1 tablet (12.5 mg total) by mouth daily. 12/10/22   Darlean Ozell NOVAK, MD  meloxicam  (MOBIC ) 7.5 MG tablet Take 1 tablet (7.5 mg total) by mouth daily as needed for pain. 10/20/22   Memon, Jehanzeb, MD  metoprolol  succinate (TOPROL -XL) 50 MG 24 hr tablet Take 50 mg by mouth daily. 08/28/22   [provider]  nitroGLYCERIN  (NITROSTAT ) 0.4 MG SL tablet Place 1 tablet (0.4 mg total) under the tongue every 5 (five) minutes as needed for chest pain. 12/28/21 08/27/23  Strader, Laymon HERO, PA-C  olmesartan  (BENICAR ) 40 MG tablet take 1 tablet by mouth once daily. 11/07/23   Darlean Ozell NOVAK, MD  omeprazole (PRILOSEC) 40 MG capsule Take 40 mg by mouth daily.  09/03/13   [provider]  rosuvastatin  (CRESTOR ) 20 MG tablet Take 20 mg by mouth at bedtime. 11/09/23   [provider]    Physical Exam: Vitals:   01/02/24 1515 01/02/24 1545 01/02/24 1600 01/02/24 1611  BP: (!) 177/72     Pulse: 85 90 99   Resp:      Temp:      TempSrc:      SpO2: 97% 91% (!) 88% (!) 89%  Weight:      Height:        Constitutional: NAD, calm, comfortable Vitals:   01/02/24 1515 01/02/24 1545 01/02/24 1600 01/02/24 1611  BP: (!) 177/72     Pulse: 85 90 99   Resp:      Temp:      TempSrc:      SpO2: 97% 91% (!) 88% (!) 89%  Weight:      Height:       Eyes: PERRL, lids and  conjunctivae normal ENMT: Mucous membranes are moist.  Neck: normal, supple, no masses, no thyromegaly Respiratory: Just ambulated to bathroom and back, with increased work of breathing, expiratory wheezing on auscultation,  No accessory muscle use.  Cardiovascular: Regular rate and rhythm, no murmurs / rubs / gallops. No extremity edema. Extremities warm. Abdomen: no tenderness, no masses palpated. No hepatosplenomegaly. Bowel sounds positive.  Musculoskeletal: no clubbing / cyanosis. No joint deformity upper and lower extremities. Skin: no rashes, lesions, ulcers. No induration Neurologic: No facial asymmetry, moving extremities spontaneously, speech fluent Psychiatric: Normal judgment and insight. Alert and oriented x 3. Normal mood.   Labs on Admission: I have personally reviewed following labs and imaging studies  CBC: Recent Labs  Lab 01/02/24 1424  WBC 8.3  NEUTROABS 4.6  HGB 12.3  HCT 38.4  MCV 91.0  PLT 317   Basic Metabolic Panel: Recent Labs  Lab 01/02/24 1424  NA 136  K 3.5  CL 99  CO2 26  GLUCOSE 103*  BUN 10  CREATININE 0.55  CALCIUM  9.1   Urine analysis:    Component Value Date/Time   COLORURINE YELLOW 01/02/2024 1135   APPEARANCEUR HAZY (A) 01/02/2024 1135   LABSPEC 1.012 01/02/2024 1135   PHURINE 7.0 01/02/2024 1135   GLUCOSEU NEGATIVE 01/02/2024 1135   HGBUR SMALL (A) 01/02/2024 1135   BILIRUBINUR NEGATIVE 01/02/2024 1135   KETONESUR NEGATIVE 01/02/2024 1135   PROTEINUR 30 (A) 01/02/2024 1135   NITRITE POSITIVE (A) 01/02/2024 1135   LEUKOCYTESUR NEGATIVE 01/02/2024 1135    Radiological Exams on Admission: DG Chest 2 View  Result Date: 01/02/2024 CLINICAL DATA:  Shortness of breath EXAM: CHEST - 2 VIEW COMPARISON:  10/18/2022 FINDINGS: Right Port-A-Cath remains in place, unchanged. Postoperative changes on the left, stable. No acute confluent airspace opacities or effusions. Heart is normal size. No acute bony abnormality. IMPRESSION: Stable  postoperative changes on the left. No active disease. Electronically Signed   By: Franky Crease M.D.   On: 01/02/2024 12:16   EKG: Independently reviewed.  Sinus rhythm, rate 73, QTc 460.  No significant change from prior.  Assessment/Plan Principal Problem:   COPD exacerbation (HCC) Active Problems:   Acute respiratory failure with hypoxia (HCC)   Influenza A   Essential hypertension   Lung cancer (HCC)   COPD GOLD 0/ AB  Assessment and Plan: * COPD exacerbation (HCC) Dyspnea, cough, expiratory wheezing on auscultation and hypoxia.  Chest x-ray clear.  Quit smoking cigarettes over/about 5 years ago.  UA with positive nitrites and many bacteria.  Afebrile without leukocytosis. -DuoNebs as needed and scheduled -Mucolytics, flutter valve -Solu-Medrol  80 mg x 1, continue 60 twice daily -IV ceftriaxone  for COPD exacerbation and possible UTI -Tamiflu  -Add on urine cultures  Influenza A Influenza infection likely provoking COPD exacerbation. -Tamiflu   Acute respiratory failure with hypoxia (HCC) O2 sats down to 86% on room air with ambulation.  Not on home O2.  Likely due to COPD exacerbation.  Lung cancer (HCC) Status post LUL lobectomy and chemotherapy in 2020.  Follows with Dr. Rogers.  Under surveillance.  Essential hypertension Elevated systolic 160s to 829d. -Resume home HCTZ, olmesartan , metoprolol    DVT prophylaxis: Lovenox  Code Status: FULL Code Family Communication: None at bedside Disposition Plan: ~ 2 days Consults called: None  Admission status: Inpt Tele I certify that at the point of admission it is my clinical judgment that the patient will require inpatient hospital care spanning beyond 2 midnights from the point of admission due to high intensity of service, high risk for further deterioration and high frequency of surveillance required.    Author: Tully FORBES Carwin, MD 01/02/2024 6:54 PM  For on call review [email or web address].

## 2024-01-03 DIAGNOSIS — J441 Chronic obstructive pulmonary disease with (acute) exacerbation: Secondary | ICD-10-CM | POA: Diagnosis not present

## 2024-01-03 LAB — CBC
HCT: 38.3 % (ref 36.0–46.0)
Hemoglobin: 12.1 g/dL (ref 12.0–15.0)
MCH: 28.3 pg (ref 26.0–34.0)
MCHC: 31.6 g/dL (ref 30.0–36.0)
MCV: 89.7 fL (ref 80.0–100.0)
Platelets: 332 10*3/uL (ref 150–400)
RBC: 4.27 MIL/uL (ref 3.87–5.11)
RDW: 13.9 % (ref 11.5–15.5)
WBC: 5.7 10*3/uL (ref 4.0–10.5)
nRBC: 0 % (ref 0.0–0.2)

## 2024-01-03 LAB — BASIC METABOLIC PANEL
Anion gap: 11 (ref 5–15)
BUN: 11 mg/dL (ref 8–23)
CO2: 28 mmol/L (ref 22–32)
Calcium: 9.3 mg/dL (ref 8.9–10.3)
Chloride: 99 mmol/L (ref 98–111)
Creatinine, Ser: 0.59 mg/dL (ref 0.44–1.00)
GFR, Estimated: >60 mL/min (ref >60)
Glucose, Bld: 181 mg/dL — ABNORMAL HIGH (ref 70–99)
Potassium: 3.5 mmol/L (ref 3.5–5.1)
Sodium: 138 mmol/L (ref 135–145)

## 2024-01-03 MED ORDER — BUDESONIDE 0.25 MG/2ML IN SUSP
0.2500 mg | Freq: Two times a day (BID) | RESPIRATORY_TRACT | Status: DC
  Administered 2024-01-03 – 2024-01-04 (×3): 0.25 mg via RESPIRATORY_TRACT
  Filled 2024-01-03 (×3): qty 2

## 2024-01-03 MED ORDER — ALPRAZOLAM 0.5 MG PO TABS
0.5000 mg | ORAL_TABLET | Freq: Once | ORAL | Status: AC
  Administered 2024-01-03: 0.5 mg via ORAL
  Filled 2024-01-03: qty 1

## 2024-01-03 MED ORDER — IPRATROPIUM-ALBUTEROL 0.5-2.5 (3) MG/3ML IN SOLN
3.0000 mL | Freq: Three times a day (TID) | RESPIRATORY_TRACT | Status: DC
  Administered 2024-01-03 – 2024-01-04 (×4): 3 mL via RESPIRATORY_TRACT
  Filled 2024-01-03 (×4): qty 3

## 2024-01-03 NOTE — TOC CM/SW Note (Signed)
 Transition of Care [facility]) - Inpatient Brief Assessment   Patient Details  Name: Laura Abbott MRN: [id number] Date of Birth: 1946/08/25  Transition of Care [facility]) CM/SW Contact:    Lucie Lunger, LCSWA Phone Number: 01/03/2024, 11:03 AM   Clinical Narrative: Transition of Care Department [facility]) has reviewed patient and no TOC needs have been identified at this time. We will continue to monitor patient advancement through interdiciplinary progression rounds. If new patient transition needs arise, please place a TOC consult.  Transition of Care Asessment: Insurance and Status: Insurance coverage has been reviewed Patient has primary care physician: Yes Home environment has been reviewed: From home Prior level of function:: Independent Prior/Current Home Services: No current home services Social Drivers of Health Review: SDOH reviewed no interventions necessary Readmission risk has been reviewed: Yes Transition of care needs: no transition of care needs at this time

## 2024-01-03 NOTE — Progress Notes (Signed)
 PROGRESS NOTE    Laura Abbott  FMW:[id number] DOB: 13-Aug-1946 DOA: 01/02/2024 PCP: Trudy Vaughn FALCON, MD   Brief Narrative:    Laura Abbott is a 78 y.o. female with medical history significant for COPD, hypertension, anxiety, lung cancer. Patient presented to the ED with complaints of difficulty breathing, wheezing, cough, chills and bodyaches, symptoms started 2/1, but significantly worsened over the past 4 days.  She was admitted for acute hypoxemic respiratory failure secondary to COPD exacerbation in the setting of influenza A infection along with UTI.  Assessment & Plan:   Principal Problem:   COPD exacerbation (HCC) Active Problems:   Acute respiratory failure with hypoxia (HCC)   Influenza A   Essential hypertension   Lung cancer (HCC)   COPD GOLD 0/ AB  Assessment and Plan:   Acute hypoxemic respiratory failure secondary to COPD exacerbation (HCC) in the setting of influenza A infection Dyspnea, cough, expiratory wheezing on auscultation and hypoxia.  Chest x-ray clear.  Quit smoking cigarettes over/about 5 years ago.  UA with positive nitrites and many bacteria.  Afebrile without leukocytosis. -DuoNebs as needed and scheduled -Mucolytics, flutter valve -Solu-Medrol  80 mg x 1, continue 60 twice daily -IV ceftriaxone  for COPD exacerbation and possible UTI -Tamiflu  -Add on urine cultures -Patient not on home O2, wean to room air as tolerated  UTI -Started on Rocephin  empirically -Urine cultures pending   Lung cancer (HCC) Status post LUL lobectomy and chemotherapy in 2020.  Follows with Dr. Rogers.  Under surveillance.   Essential hypertension Elevated systolic 160s to 829d. -Resume home HCTZ, olmesartan , metoprolol   Obesity, class II -BMI 36.32    DVT prophylaxis: Lovenox  Code Status: Full Family Communication: None at bedside Disposition Plan:  Status is: Inpatient Remains inpatient appropriate because: Need for IV medications.   Consultants:   None  Procedures:  None  Antimicrobials:  Anti-infectives (From admission, onward)    Start     Dose/Rate Route Frequency Ordered Stop   01/02/24 2200  oseltamivir  (TAMIFLU ) capsule 75 mg        75 mg Oral 2 times daily 01/02/24 1659 01/07/24 2159   01/02/24 1800  cefTRIAXone  (ROCEPHIN ) 1 g in sodium chloride  0.9 % 100 mL IVPB        1 g 200 mL/hr over 30 Minutes Intravenous Every 24 hours 01/02/24 1701         Subjective: Patient seen and evaluated today with no new acute complaints or concerns. No acute concerns or events noted overnight.  She states that she is overall starting to feel much better.  Objective: Vitals:   01/03/24 0600 01/03/24 0612 01/03/24 0630 01/03/24 0700  BP: (!) 141/69  (!) 156/75 (!) 157/66  Pulse: 84  96 90  Resp: 19  20 20   Temp:      TempSrc:      SpO2: 95% 96% 93% 94%  Weight:      Height:        Intake/Output Summary (Last 24 hours) at 01/03/2024 0719 Last data filed at 01/02/2024 1906 Gross per 24 hour  Intake 100 ml  Output --  Net 100 ml   Filed Weights   01/02/24 1121  Weight: 93 kg    Examination:  General exam: Appears calm and comfortable  Respiratory system: Clear to auscultation. Respiratory effort normal.  3 L nasal cannula. Cardiovascular system: S1 & S2 heard, RRR.  Gastrointestinal system: Abdomen is soft Central nervous system: Alert and awake Extremities: No edema Skin: No significant  lesions noted Psychiatry: Flat affect.    Data Reviewed: I have personally reviewed following labs and imaging studies  CBC: Recent Labs  Lab 01/02/24 1424 01/03/24 0252  WBC 8.3 5.7  NEUTROABS 4.6  --   HGB 12.3 12.1  HCT 38.4 38.3  MCV 91.0 89.7  PLT 317 332   Basic Metabolic Panel: Recent Labs  Lab 01/02/24 1424 01/03/24 0252  NA 136 138  K 3.5 3.5  CL 99 99  CO2 26 28  GLUCOSE 103* 181*  BUN 10 11  CREATININE 0.55 0.59  CALCIUM  9.1 9.3   GFR: Estimated Creatinine Clearance: 64.8 mL/min (by C-G formula  based on SCr of 0.59 mg/dL). Liver Function Tests: No results for input(s): AST, ALT, ALKPHOS, BILITOT, PROT, ALBUMIN  in the last 168 hours. No results for input(s): LIPASE, AMYLASE in the last 168 hours. No results for input(s): AMMONIA in the last 168 hours. Coagulation Profile: No results for input(s): INR, PROTIME in the last 168 hours. Cardiac Enzymes: No results for input(s): CKTOTAL, CKMB, CKMBINDEX, TROPONINI in the last 168 hours. BNP (last 3 results) No results for input(s): PROBNP in the last 8760 hours. HbA1C: No results for input(s): HGBA1C in the last 72 hours. CBG: No results for input(s): GLUCAP in the last 168 hours. Lipid Profile: No results for input(s): CHOL, HDL, LDLCALC, TRIG, CHOLHDL, LDLDIRECT in the last 72 hours. Thyroid  Function Tests: No results for input(s): TSH, T4TOTAL, FREET4, T3FREE, THYROIDAB in the last 72 hours. Anemia Panel: No results for input(s): VITAMINB12, FOLATE, FERRITIN, TIBC, IRON, RETICCTPCT in the last 72 hours. Sepsis Labs: No results for input(s): PROCALCITON, LATICACIDVEN in the last 168 hours.  Recent Results (from the past 240 hours)  Resp panel by RT-PCR (RSV, Flu A&B, Covid) Anterior Nasal Swab     Status: Abnormal   Collection Time: 01/02/24 11:25 AM   Specimen: Anterior Nasal Swab  Result Value Ref Range Status   SARS Coronavirus 2 by RT PCR NEGATIVE NEGATIVE Final    Comment: (NOTE) SARS-CoV-2 target nucleic acids are NOT DETECTED.  The SARS-CoV-2 RNA is generally detectable in upper respiratory specimens during the acute phase of infection. The lowest concentration of SARS-CoV-2 viral copies this assay can detect is 138 copies/mL. A negative result does not preclude SARS-Cov-2 infection and should not be used as the sole basis for treatment or other patient management decisions. A negative result may occur with  improper specimen  collection/handling, submission of specimen other than nasopharyngeal swab, presence of viral mutation(s) within the areas targeted by this assay, and inadequate number of viral copies(<138 copies/mL). A negative result must be combined with clinical observations, patient history, and epidemiological information. The expected result is Negative.  Fact Sheet for Patients:  bloggercourse.com  Fact Sheet for Healthcare Providers:  seriousbroker.it  This test is no t yet approved or cleared by the [place]  FDA and  has been authorized for detection and/or diagnosis of SARS-CoV-2 by FDA under an Emergency Use Authorization (EUA). This EUA will remain  in effect (meaning this test can be used) for the duration of the COVID-19 declaration under Section 564(b)(1) of the Act, 21 U.S.C.section 360bbb-3(b)(1), unless the authorization is terminated  or revoked sooner.       Influenza A by PCR POSITIVE (A) NEGATIVE Final   Influenza B by PCR NEGATIVE NEGATIVE Final    Comment: (NOTE) The Xpert Xpress SARS-CoV-2/FLU/RSV plus assay is intended as an aid in the diagnosis of influenza from Nasopharyngeal swab specimens and should  not be used as a sole basis for treatment. Nasal washings and aspirates are unacceptable for Xpert Xpress SARS-CoV-2/FLU/RSV testing.  Fact Sheet for Patients: bloggercourse.com  Fact Sheet for Healthcare Providers: seriousbroker.it  This test is not yet approved or cleared by the [place]  FDA and has been authorized for detection and/or diagnosis of SARS-CoV-2 by FDA under an Emergency Use Authorization (EUA). This EUA will remain in effect (meaning this test can be used) for the duration of the COVID-19 declaration under Section 564(b)(1) of the Act, 21 U.S.C. section 360bbb-3(b)(1), unless the authorization is terminated or revoked.     Resp Syncytial  Virus by PCR NEGATIVE NEGATIVE Final    Comment: (NOTE) Fact Sheet for Patients: bloggercourse.com  Fact Sheet for Healthcare Providers: seriousbroker.it  This test is not yet approved or cleared by the [place]  FDA and has been authorized for detection and/or diagnosis of SARS-CoV-2 by FDA under an Emergency Use Authorization (EUA). This EUA will remain in effect (meaning this test can be used) for the duration of the COVID-19 declaration under Section 564(b)(1) of the Act, 21 U.S.C. section 360bbb-3(b)(1), unless the authorization is terminated or revoked.  Performed at [facility], [address] [place], [place] [address]          Radiology Studies: DG Chest 2 View Result Date: 01/02/2024 CLINICAL DATA:  Shortness of breath EXAM: CHEST - 2 VIEW COMPARISON:  10/18/2022 FINDINGS: Right Port-A-Cath remains in place, unchanged. Postoperative changes on the left, stable. No acute confluent airspace opacities or effusions. Heart is normal size. No acute bony abnormality. IMPRESSION: Stable postoperative changes on the left. No active disease. Electronically Signed   By: Franky Crease M.D.   On: 01/02/2024 12:16        Scheduled Meds:  aspirin  EC  81 mg Oral Daily   budesonide   0.25 mg Nebulization BID   clonazePAM   0.5 mg Oral QHS   enoxaparin  (LOVENOX ) injection  40 mg Subcutaneous Q24H   FLUoxetine   40 mg Oral Daily   gabapentin   300 mg Oral BID   guaiFENesin -dextromethorphan   15 mL Oral Q8H   hydrochlorothiazide   12.5 mg Oral Daily   ipratropium-albuterol   3 mL Nebulization TID   irbesartan   300 mg Oral Daily   methylPREDNISolone  (SOLU-MEDROL ) injection  60 mg Intravenous Q12H   Followed by   NOREEN ON 01/04/2024] predniSONE   40 mg Oral Q breakfast   metoprolol  succinate  50 mg Oral Daily   oseltamivir   75 mg Oral BID   pantoprazole   40 mg Oral Daily   rosuvastatin   20 mg Oral QHS   tiZANidine   2 mg Oral BID    Continuous Infusions:  cefTRIAXone  (ROCEPHIN )  IV Stopped (01/02/24 1906)     LOS: 1 day    Time spent: 55 minutes    Calen Geister D Maree, DO [facility]  If 7PM-7AM, please contact night-coverage [email or web address] 01/03/2024, 7:19 AM

## 2024-01-04 DIAGNOSIS — J441 Chronic obstructive pulmonary disease with (acute) exacerbation: Secondary | ICD-10-CM | POA: Diagnosis not present

## 2024-01-04 LAB — URINE CULTURE: Culture: >=100000 — AB

## 2024-01-04 MED ORDER — BUDESONIDE 0.25 MG/2ML IN SUSP: 0.2500 mg | Freq: Two times a day (BID) | RESPIRATORY_TRACT | 2 refills | Status: AC

## 2024-01-04 MED ORDER — PREDNISONE 20 MG PO TABS: 40.0000 mg | ORAL_TABLET | Freq: Every day | ORAL | 0 refills | Status: AC

## 2024-01-04 MED ORDER — OSELTAMIVIR PHOSPHATE 75 MG PO CAPS: 75.0000 mg | ORAL_CAPSULE | Freq: Two times a day (BID) | ORAL | 0 refills | Status: AC

## 2024-01-04 MED ORDER — SODIUM CHLORIDE 0.9 % IV SOLN
1.0000 g | INTRAVENOUS | Status: DC
  Administered 2024-01-04: 1 g via INTRAVENOUS
  Filled 2024-01-04: qty 10

## 2024-01-04 NOTE — Discharge Summary (Signed)
 Physician Discharge Summary  Laura Abbott FMW:[id number] DOB: [birth year] DOA: 01/02/2024  PCP: Trudy Vaughn FALCON, MD  Admit date: 01/02/2024  Discharge date: 01/04/2024  Admitted From:Home  Disposition:  Home  Recommendations for Outpatient Follow-up:  Follow up with PCP in 1-2 weeks Continue on prednisone  for 3 more days as well as Tamiflu  Finished course of Rocephin  during hospitalization for E. coli UTI Continue other home medications as prior  Home Health:None  Equipment/Devices:None  Discharge Condition:Stable  CODE STATUS: Full  Diet recommendation: Heart Healthy  Brief/Interim Summary: Laura Abbott is a 78 y.o. female with medical history significant for COPD, hypertension, anxiety, lung cancer. Patient presented to the ED with complaints of difficulty breathing, wheezing, cough, chills and bodyaches, symptoms started 2/1, but significantly worsened over the past 4 days.  She was admitted for acute hypoxemic respiratory failure secondary to COPD exacerbation in the setting of influenza A infection along with UTI.  Her urine cultures did demonstrate growth of E. coli and she has completed 3-day course of IV Rocephin  while hospitalized.  She no longer has any oxygen  requirements at home and is otherwise in stable condition for discharge.  Discharge Diagnoses:  Principal Problem:   COPD exacerbation (HCC) Active Problems:   Acute respiratory failure with hypoxia (HCC)   Influenza A   Essential hypertension   Lung cancer (HCC)   COPD GOLD 0/ AB  Principal discharge diagnosis: Acute hypoxemic respiratory failure secondary to COPD exacerbation in the setting of influenza A infection.  E. coli UTI.  Discharge Instructions  Discharge Instructions     Diet - low sodium heart healthy   Complete by: As directed    Increase activity slowly   Complete by: As directed       Allergies as of 01/04/2024   No Known Allergies      Medication List     TAKE these medications     albuterol  108 (90 Base) MCG/ACT inhaler Commonly known as: VENTOLIN  HFA Inhale 2 puffs into the lungs 3 (three) times daily. Also use the inhaler every 2 hours if needed for worsening shortness of breath and wheezing.   albuterol  (2.5 MG/3ML) 0.083% nebulizer solution Commonly known as: PROVENTIL  Take 3 mLs (2.5 mg total) by nebulization every 2 (two) hours as needed for shortness of breath.   aspirin  EC 81 MG tablet Take 1 tablet (81 mg total) by mouth daily. Swallow whole.   azelastine 0.1 % nasal spray Commonly known as: ASTELIN Place 1 spray into both nostrils daily.   budesonide  0.25 MG/2ML nebulizer solution Commonly known as: PULMICORT  Take 2 mLs (0.25 mg total) by nebulization 2 (two) times daily.   Calcium  600-200 MG-UNIT tablet Take 1 tablet by mouth daily.   clonazePAM  0.5 MG tablet Commonly known as: KLONOPIN  Take 0.5 mg by mouth at bedtime.   dextromethorphan -guaiFENesin  30-600 MG 12hr tablet Commonly known as: MUCINEX  DM Take 1 tablet by mouth 2 (two) times daily.   FLUoxetine  40 MG capsule Commonly known as: PROZAC  Take 40 mg by mouth daily.   furosemide  20 MG tablet Commonly known as: LASIX  Take 20 mg by mouth as needed for fluid or edema.   gabapentin  300 MG capsule Commonly known as: NEURONTIN  Take 300 mg by mouth 2 (two) times daily.   hydrochlorothiazide  12.5 MG tablet Commonly known as: HYDRODIURIL  Take 1 tablet (12.5 mg total) by mouth daily.   meloxicam  7.5 MG tablet Commonly known as: MOBIC  Take 1 tablet (7.5 mg total) by mouth daily as  needed for pain.   metoprolol  succinate 50 MG 24 hr tablet Commonly known as: TOPROL -XL Take 50 mg by mouth daily.   nitroGLYCERIN  0.4 MG SL tablet Commonly known as: NITROSTAT  Place 1 tablet (0.4 mg total) under the tongue every 5 (five) minutes as needed for chest pain.   olmesartan  40 MG tablet Commonly known as: BENICAR  take 1 tablet by mouth once daily. What changed:  how much to take how  to take this when to take this   omeprazole 40 MG capsule Commonly known as: PRILOSEC Take 40 mg by mouth daily.   oseltamivir  75 MG capsule Commonly known as: TAMIFLU  Take 1 capsule (75 mg total) by mouth 2 (two) times daily for 3 days.   predniSONE  20 MG tablet Commonly known as: DELTASONE  Take 2 tablets (40 mg total) by mouth daily with breakfast for 3 days. Start taking on: January 05, 2024   rosuvastatin  20 MG tablet Commonly known as: CRESTOR  Take 20 mg by mouth at bedtime.   tiZANidine  2 MG tablet Commonly known as: ZANAFLEX  Take 2 mg by mouth 2 (two) times daily.        Follow-up Information     Trudy Vaughn FALCON, MD. Schedule an appointment as soon as possible for a visit in 1 week(s).   Specialty: Internal Medicine Contact information: [address] [place] [address] [phone]                No Known Allergies  Consultations: None   Procedures/Studies: DG Chest 2 View Result Date: 01/02/2024 CLINICAL DATA:  Shortness of breath EXAM: CHEST - 2 VIEW COMPARISON:  10/18/2022 FINDINGS: Right Port-A-Cath remains in place, unchanged. Postoperative changes on the left, stable. No acute confluent airspace opacities or effusions. Heart is normal size. No acute bony abnormality. IMPRESSION: Stable postoperative changes on the left. No active disease. Electronically Signed   By: Franky Crease M.D.   On: 01/02/2024 12:16     Discharge Exam: Vitals:   01/04/24 0950 01/04/24 0959  BP: 131/72 131/72  Pulse: 84 84  Resp: 18   Temp:    SpO2: 94%    Vitals:   01/03/24 2130 01/04/24 0341 01/04/24 0950 01/04/24 0959  BP:  (!) 153/60 131/72 131/72  Pulse:  82 84 84  Resp:   18   Temp:  97.8 F (36.6 C)    TempSrc:  Oral    SpO2: 93% 91% 94%   Weight:      Height:        General: Pt is alert, awake, not in acute distress Cardiovascular: RRR, S1/S2 +, no rubs, no gallops Respiratory: CTA bilaterally, no wheezing, no rhonchi Abdominal: Soft, NT,  ND, bowel sounds + Extremities: no edema, no cyanosis    The results of significant diagnostics from this hospitalization (including imaging, microbiology, ancillary and laboratory) are listed below for reference.     Microbiology: Recent Results (from the past 240 hours)  Resp panel by RT-PCR (RSV, Flu A&B, Covid) Anterior Nasal Swab     Status: Abnormal   Collection Time: 01/02/24 11:25 AM   Specimen: Anterior Nasal Swab  Result Value Ref Range Status   SARS Coronavirus 2 by RT PCR NEGATIVE NEGATIVE Final    Comment: (NOTE) SARS-CoV-2 target nucleic acids are NOT DETECTED.  The SARS-CoV-2 RNA is generally detectable in upper respiratory specimens during the acute phase of infection. The lowest concentration of SARS-CoV-2 viral copies this assay can detect is 138 copies/mL. A negative result does  not preclude SARS-Cov-2 infection and should not be used as the sole basis for treatment or other patient management decisions. A negative result may occur with  improper specimen collection/handling, submission of specimen other than nasopharyngeal swab, presence of viral mutation(s) within the areas targeted by this assay, and inadequate number of viral copies(<138 copies/mL). A negative result must be combined with clinical observations, patient history, and epidemiological information. The expected result is Negative.  Fact Sheet for Patients:  bloggercourse.com  Fact Sheet for Healthcare Providers:  seriousbroker.it  This test is no t yet approved or cleared by the [place]  FDA and  has been authorized for detection and/or diagnosis of SARS-CoV-2 by FDA under an Emergency Use Authorization (EUA). This EUA will remain  in effect (meaning this test can be used) for the duration of the COVID-19 declaration under Section 564(b)(1) of the Act, 21 U.S.C.section 360bbb-3(b)(1), unless the authorization is terminated  or revoked  sooner.       Influenza A by PCR POSITIVE (A) NEGATIVE Final   Influenza B by PCR NEGATIVE NEGATIVE Final    Comment: (NOTE) The Xpert Xpress SARS-CoV-2/FLU/RSV plus assay is intended as an aid in the diagnosis of influenza from Nasopharyngeal swab specimens and should not be used as a sole basis for treatment. Nasal washings and aspirates are unacceptable for Xpert Xpress SARS-CoV-2/FLU/RSV testing.  Fact Sheet for Patients: bloggercourse.com  Fact Sheet for Healthcare Providers: seriousbroker.it  This test is not yet approved or cleared by the [place]  FDA and has been authorized for detection and/or diagnosis of SARS-CoV-2 by FDA under an Emergency Use Authorization (EUA). This EUA will remain in effect (meaning this test can be used) for the duration of the COVID-19 declaration under Section 564(b)(1) of the Act, 21 U.S.C. section 360bbb-3(b)(1), unless the authorization is terminated or revoked.     Resp Syncytial Virus by PCR NEGATIVE NEGATIVE Final    Comment: (NOTE) Fact Sheet for Patients: bloggercourse.com  Fact Sheet for Healthcare Providers: seriousbroker.it  This test is not yet approved or cleared by the [place]  FDA and has been authorized for detection and/or diagnosis of SARS-CoV-2 by FDA under an Emergency Use Authorization (EUA). This EUA will remain in effect (meaning this test can be used) for the duration of the COVID-19 declaration under Section 564(b)(1) of the Act, 21 U.S.C. section 360bbb-3(b)(1), unless the authorization is terminated or revoked.  Performed at [facility], [address], [place], [place] [address]   Urine Culture (for pregnant, neutropenic or urologic patients or patients with an indwelling urinary catheter)     Status: Abnormal   Collection Time: 01/02/24 11:35 AM   Specimen: Urine, Clean Catch  Result Value Ref  Range Status   Specimen Description   Final    URINE, CLEAN CATCH Performed at [facility], [address], [place], [place] [address]    Special Requests   Final    NONE Performed at [facility], [address] [place], [place] [address]    Culture >=100,000 COLONIES/mL ESCHERICHIA COLI (A)  Final   Report Status 01/04/2024 FINAL  Final   Organism ID, Bacteria ESCHERICHIA COLI (A)  Final      Susceptibility   Escherichia coli - MIC*    AMPICILLIN >=32 RESISTANT Resistant     CEFAZOLIN  <=4 SENSITIVE Sensitive     CEFEPIME <=0.12 SENSITIVE Sensitive     CEFTRIAXONE  <=0.25 SENSITIVE Sensitive     CIPROFLOXACIN <=0.25 SENSITIVE Sensitive     GENTAMICIN <=1 SENSITIVE Sensitive  IMIPENEM <=0.25 SENSITIVE Sensitive     NITROFURANTOIN <=16 SENSITIVE Sensitive     TRIMETH/SULFA <=20 SENSITIVE Sensitive     AMPICILLIN/SULBACTAM 16 INTERMEDIATE Intermediate     PIP/TAZO <=4 SENSITIVE Sensitive ug/mL    * >=100,000 COLONIES/mL ESCHERICHIA COLI     Labs: BNP (last 3 results) No results for input(s): BNP in the last 8760 hours. Basic Metabolic Panel: Recent Labs  Lab 01/02/24 1424 01/03/24 0252  NA 136 138  K 3.5 3.5  CL 99 99  CO2 26 28  GLUCOSE 103* 181*  BUN 10 11  CREATININE 0.55 0.59  CALCIUM  9.1 9.3   Liver Function Tests: No results for input(s): AST, ALT, ALKPHOS, BILITOT, PROT, ALBUMIN  in the last 168 hours. No results for input(s): LIPASE, AMYLASE in the last 168 hours. No results for input(s): AMMONIA in the last 168 hours. CBC: Recent Labs  Lab 01/02/24 1424 01/03/24 0252  WBC 8.3 5.7  NEUTROABS 4.6  --   HGB 12.3 12.1  HCT 38.4 38.3  MCV 91.0 89.7  PLT 317 332   Cardiac Enzymes: No results for input(s): CKTOTAL, CKMB, CKMBINDEX, TROPONINI in the last 168 hours. BNP: Invalid input(s): POCBNP CBG: No results for input(s): GLUCAP in the last 168 hours. D-Dimer No results for input(s): DDIMER in the last 72  hours. Hgb A1c No results for input(s): HGBA1C in the last 72 hours. Lipid Profile No results for input(s): CHOL, HDL, LDLCALC, TRIG, CHOLHDL, LDLDIRECT in the last 72 hours. Thyroid  function studies No results for input(s): TSH, T4TOTAL, T3FREE, THYROIDAB in the last 72 hours.  Invalid input(s): FREET3 Anemia work up No results for input(s): VITAMINB12, FOLATE, FERRITIN, TIBC, IRON, RETICCTPCT in the last 72 hours. Urinalysis    Component Value Date/Time   COLORURINE YELLOW 01/02/2024 1135   APPEARANCEUR HAZY (A) 01/02/2024 1135   LABSPEC 1.012 01/02/2024 1135   PHURINE 7.0 01/02/2024 1135   GLUCOSEU NEGATIVE 01/02/2024 1135   HGBUR SMALL (A) 01/02/2024 1135   BILIRUBINUR NEGATIVE 01/02/2024 1135   KETONESUR NEGATIVE 01/02/2024 1135   PROTEINUR 30 (A) 01/02/2024 1135   NITRITE POSITIVE (A) 01/02/2024 1135   LEUKOCYTESUR NEGATIVE 01/02/2024 1135   Sepsis Labs Recent Labs  Lab 01/02/24 1424 01/03/24 0252  WBC 8.3 5.7   Microbiology Recent Results (from the past 240 hours)  Resp panel by RT-PCR (RSV, Flu A&B, Covid) Anterior Nasal Swab     Status: Abnormal   Collection Time: 01/02/24 11:25 AM   Specimen: Anterior Nasal Swab  Result Value Ref Range Status   SARS Coronavirus 2 by RT PCR NEGATIVE NEGATIVE Final    Comment: (NOTE) SARS-CoV-2 target nucleic acids are NOT DETECTED.  The SARS-CoV-2 RNA is generally detectable in upper respiratory specimens during the acute phase of infection. The lowest concentration of SARS-CoV-2 viral copies this assay can detect is 138 copies/mL. A negative result does not preclude SARS-Cov-2 infection and should not be used as the sole basis for treatment or other patient management decisions. A negative result may occur with  improper specimen collection/handling, submission of specimen other than nasopharyngeal swab, presence of viral mutation(s) within the areas targeted by this assay, and  inadequate number of viral copies(<138 copies/mL). A negative result must be combined with clinical observations, patient history, and epidemiological information. The expected result is Negative.  Fact Sheet for Patients:  bloggercourse.com  Fact Sheet for Healthcare Providers:  seriousbroker.it  This test is no t yet approved or cleared by the [place]  FDA and  has  been authorized for detection and/or diagnosis of SARS-CoV-2 by FDA under an Emergency Use Authorization (EUA). This EUA will remain  in effect (meaning this test can be used) for the duration of the COVID-19 declaration under Section 564(b)(1) of the Act, 21 U.S.C.section 360bbb-3(b)(1), unless the authorization is terminated  or revoked sooner.       Influenza A by PCR POSITIVE (A) NEGATIVE Final   Influenza B by PCR NEGATIVE NEGATIVE Final    Comment: (NOTE) The Xpert Xpress SARS-CoV-2/FLU/RSV plus assay is intended as an aid in the diagnosis of influenza from Nasopharyngeal swab specimens and should not be used as a sole basis for treatment. Nasal washings and aspirates are unacceptable for Xpert Xpress SARS-CoV-2/FLU/RSV testing.  Fact Sheet for Patients: bloggercourse.com  Fact Sheet for Healthcare Providers: seriousbroker.it  This test is not yet approved or cleared by the [place]  FDA and has been authorized for detection and/or diagnosis of SARS-CoV-2 by FDA under an Emergency Use Authorization (EUA). This EUA will remain in effect (meaning this test can be used) for the duration of the COVID-19 declaration under Section 564(b)(1) of the Act, 21 U.S.C. section 360bbb-3(b)(1), unless the authorization is terminated or revoked.     Resp Syncytial Virus by PCR NEGATIVE NEGATIVE Final    Comment: (NOTE) Fact Sheet for Patients: bloggercourse.com  Fact Sheet for  Healthcare Providers: seriousbroker.it  This test is not yet approved or cleared by the [place]  FDA and has been authorized for detection and/or diagnosis of SARS-CoV-2 by FDA under an Emergency Use Authorization (EUA). This EUA will remain in effect (meaning this test can be used) for the duration of the COVID-19 declaration under Section 564(b)(1) of the Act, 21 U.S.C. section 360bbb-3(b)(1), unless the authorization is terminated or revoked.  Performed at [facility], [address], [place], [place] [address]   Urine Culture (for pregnant, neutropenic or urologic patients or patients with an indwelling urinary catheter)     Status: Abnormal   Collection Time: 01/02/24 11:35 AM   Specimen: Urine, Clean Catch  Result Value Ref Range Status   Specimen Description   Final    URINE, CLEAN CATCH Performed at [facility], [address], [place], [place] [address]    Special Requests   Final    NONE Performed at [facility], [address], [place], [place] [address]    Culture >=100,000 COLONIES/mL ESCHERICHIA COLI (A)  Final   Report Status 01/04/2024 FINAL  Final   Organism ID, Bacteria ESCHERICHIA COLI (A)  Final      Susceptibility   Escherichia coli - MIC*    AMPICILLIN >=32 RESISTANT Resistant     CEFAZOLIN  <=4 SENSITIVE Sensitive     CEFEPIME <=0.12 SENSITIVE Sensitive     CEFTRIAXONE  <=0.25 SENSITIVE Sensitive     CIPROFLOXACIN <=0.25 SENSITIVE Sensitive     GENTAMICIN <=1 SENSITIVE Sensitive     IMIPENEM <=0.25 SENSITIVE Sensitive     NITROFURANTOIN <=16 SENSITIVE Sensitive     TRIMETH/SULFA <=20 SENSITIVE Sensitive     AMPICILLIN/SULBACTAM 16 INTERMEDIATE Intermediate     PIP/TAZO <=4 SENSITIVE Sensitive ug/mL    * >=100,000 COLONIES/mL ESCHERICHIA COLI     Time coordinating discharge: 35 minutes  SIGNED:   Adron JONETTA Fairly, DO [facility] 01/04/2024, 10:14 AM  If 7PM-7AM, please contact night-coverage [email or web address]

## 2024-01-04 NOTE — Progress Notes (Signed)
 Mobility Specialist Progress Note:    01/04/24 1032  Mobility  Activity Ambulated with assistance in room;Dangled on edge of bed  Level of Assistance Standby assist, set-up cues, supervision of patient - no hands on  Assistive Device None  Distance Ambulated (ft) 45 ft  Range of Motion/Exercises Active;All extremities  Activity Response Tolerated well  Mobility Referral Yes  Mobility visit 1 Mobility  Mobility Specialist Start Time (ACUTE ONLY) 1010  Mobility Specialist Stop Time (ACUTE ONLY) 1030  Mobility Specialist Time Calculation (min) (ACUTE ONLY) 20 min   Pt received in bed, agreeable to mobility. Required supervision to stand and ambulate with no AD. Tolerated well, SpO2 93% on RA throughout ambulation. SpO2 dropped to 87% on RA near EOS, recovered immediately, SpO2 92% on RA. Left pt sitting EOB, all needs met.   Sherrilee Ditty Mobility Specialist Please contact via Special Educational Needs Teacher or  Rehab office at [phone]

## 2024-01-04 NOTE — Plan of Care (Signed)
 Pt did not experience any issues during the night. Anxious to be discharged home today.   Problem: Health Behavior/Discharge Planning: Goal: Ability to manage health-related needs will improve Outcome: Progressing   Problem: Clinical Measurements: Goal: Ability to maintain clinical measurements within normal limits will improve Outcome: Progressing Goal: Will remain free from infection Outcome: Progressing Goal: Diagnostic test results will improve Outcome: Progressing   Problem: Activity: Goal: Ability to tolerate increased activity will improve Outcome: Progressing Goal: Will verbalize the importance of balancing activity with adequate rest periods Outcome: Progressing   Problem: Respiratory: Goal: Ability to maintain a clear airway will improve Outcome: Progressing Goal: Levels of oxygenation will improve Outcome: Progressing Goal: Ability to maintain adequate ventilation will improve Outcome: Progressing

## 2024-01-13 DIAGNOSIS — J09X2 Influenza due to identified novel influenza A virus with other respiratory manifestations: Secondary | ICD-10-CM | POA: Diagnosis not present

## 2024-01-13 DIAGNOSIS — J101 Influenza due to other identified influenza virus with other respiratory manifestations: Secondary | ICD-10-CM | POA: Diagnosis not present

## 2024-01-13 DIAGNOSIS — F32A Depression, unspecified: Secondary | ICD-10-CM | POA: Diagnosis not present

## 2024-01-13 DIAGNOSIS — J449 Chronic obstructive pulmonary disease, unspecified: Secondary | ICD-10-CM | POA: Diagnosis not present

## 2024-01-20 DIAGNOSIS — Z0001 Encounter for general adult medical examination with abnormal findings: Secondary | ICD-10-CM | POA: Diagnosis not present

## 2024-01-20 DIAGNOSIS — Z131 Encounter for screening for diabetes mellitus: Secondary | ICD-10-CM | POA: Diagnosis not present

## 2024-01-20 DIAGNOSIS — E559 Vitamin D deficiency, unspecified: Secondary | ICD-10-CM | POA: Diagnosis not present

## 2024-01-20 DIAGNOSIS — E7801 Familial hypercholesterolemia: Secondary | ICD-10-CM | POA: Diagnosis not present

## 2024-01-20 DIAGNOSIS — I10 Essential (primary) hypertension: Secondary | ICD-10-CM | POA: Diagnosis not present

## 2024-01-20 DIAGNOSIS — K219 Gastro-esophageal reflux disease without esophagitis: Secondary | ICD-10-CM | POA: Diagnosis not present

## 2024-01-27 DIAGNOSIS — M47816 Spondylosis without myelopathy or radiculopathy, lumbar region: Secondary | ICD-10-CM | POA: Diagnosis not present

## 2024-01-27 DIAGNOSIS — Z6835 Body mass index (BMI) 35.0-35.9, adult: Secondary | ICD-10-CM | POA: Diagnosis not present

## 2024-01-27 DIAGNOSIS — M549 Dorsalgia, unspecified: Secondary | ICD-10-CM | POA: Diagnosis not present

## 2024-01-27 DIAGNOSIS — F32A Depression, unspecified: Secondary | ICD-10-CM | POA: Diagnosis not present

## 2024-01-27 DIAGNOSIS — M47817 Spondylosis without myelopathy or radiculopathy, lumbosacral region: Secondary | ICD-10-CM | POA: Diagnosis not present

## 2024-01-27 DIAGNOSIS — J449 Chronic obstructive pulmonary disease, unspecified: Secondary | ICD-10-CM | POA: Diagnosis not present

## 2024-01-27 DIAGNOSIS — I2510 Atherosclerotic heart disease of native coronary artery without angina pectoris: Secondary | ICD-10-CM | POA: Diagnosis not present

## 2024-01-27 DIAGNOSIS — F419 Anxiety disorder, unspecified: Secondary | ICD-10-CM | POA: Diagnosis not present

## 2024-01-27 DIAGNOSIS — M5136 Other intervertebral disc degeneration, lumbar region with discogenic back pain only: Secondary | ICD-10-CM | POA: Diagnosis not present

## 2024-01-27 DIAGNOSIS — J101 Influenza due to other identified influenza virus with other respiratory manifestations: Secondary | ICD-10-CM | POA: Diagnosis not present

## 2024-01-31 DIAGNOSIS — M810 Age-related osteoporosis without current pathological fracture: Secondary | ICD-10-CM | POA: Diagnosis not present

## 2024-02-20 ENCOUNTER — Ambulatory Visit (INDEPENDENT_AMBULATORY_CARE_PROVIDER_SITE_OTHER): Admitting: Internal Medicine

## 2024-02-20 ENCOUNTER — Encounter: Payer: Self-pay | Admitting: Internal Medicine

## 2024-02-20 VITALS — BP 138/78 | HR 82 | Temp 98.1°F | Ht 62.0 in | Wt 199.8 lb

## 2024-02-20 DIAGNOSIS — Z1211 Encounter for screening for malignant neoplasm of colon: Secondary | ICD-10-CM

## 2024-02-20 DIAGNOSIS — K219 Gastro-esophageal reflux disease without esophagitis: Secondary | ICD-10-CM

## 2024-02-20 DIAGNOSIS — K760 Fatty (change of) liver, not elsewhere classified: Secondary | ICD-10-CM

## 2024-02-20 DIAGNOSIS — K5904 Chronic idiopathic constipation: Secondary | ICD-10-CM

## 2024-02-20 DIAGNOSIS — K5909 Other constipation: Secondary | ICD-10-CM

## 2024-02-20 NOTE — Patient Instructions (Addendum)
 I am going to order ultrasound of your liver to further evaluate for possible scarring.  Continue on omeprazole for your chronic reflux.  Continue Dulcolax for your chronic constipation.  Follow-up in 3 months.  It was very nice meeting you today.  Dr. Marletta Lor

## 2024-02-20 NOTE — Progress Notes (Signed)
 Primary Care Physician:  Donetta Potts, MD Primary Gastroenterologist:  Dr. Marletta Lor  Chief Complaint  Patient presents with   Abnormal imaging    Here for evaluation of possible cirrhosis, abd CT in 10/2023    HPI:   Laura Abbott is a 78 y.o. female who presents to clinic today by referral from her PCP Dr. Mayford Knife for evaluation.  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  ?Cirrhosis: Routine surveillance CT chest for history of lung cancer 11/18/2023 noted slightly nodular contour of the liver cannot exclude cirrhosis.  Also noted advanced atherosclerosis involving the celiac trunk and SMA.  Status post cholecystectomy.  Blood work 11/18/2023 showed normal liver enzymes.  Albumin level normal.  Recent CBC with normal platelet count.  Does report family history of cirrhosis in 2 sisters (no alcohol use) and brother (alcoholic).  She denies any personal history of alcohol use.  Does have risk factors for MASLD including obesity, dyslipidemia, hypertension.  No exposure to viral hepatitis in the past.  No herbal supplements.  Denies any issues abdominal swelling or lower extremity edema.  No history of hepatic encephalopathy.  Chronic GERD: Well-controlled omeprazole daily.  Denies any dysphagia odynophagia.  No epigastric chest pain.  Constipation: Mild, takes tramadol for neck pain.  Also takes Tylenol which she states works better for her anyway.  Currently taking Dulcolax as needed.  Believes her symptoms are well-controlled with this medication.  No associated abdominal pain.  Colon cancer screening: States her last colonoscopy was many years ago.  Currently getting regular Cologuard testing every 3 years which she states the last 2 have been negative.  No family history of colorectal malignancy.  No melena hematochezia.   Past Medical  History:  Diagnosis Date   Anxiety    Arthritis    Asthma    Breast cancer (HCC)    breast, right DCIS   COPD (chronic obstructive pulmonary disease) (HCC)    COPD exacerbation (HCC) 01/03/2014   With hypoxia   Depression    Emphysema of lung (HCC)    Family history of breast cancer    Family history of colon cancer    Family history of kidney cancer    Family history of lung cancer    Family history of pancreatic cancer    GERD (gastroesophageal reflux disease)    History of kidney stones    Hyperlipidemia    Hypertension    Insomnia    Lung cancer (HCC)    Obesity 01/05/2014   Osteoporosis    Osteoporosis, unspecified 12/01/2013   Personal history of radiation therapy    rt breast   Pneumonia    x2   Tobacco abuse 12/01/2013   Ulnar nerve impingement, left     Past Surgical History:  Procedure Laterality Date   ABDOMINAL HYSTERECTOMY     BLADDER SURGERY     had tacked   BREAST LUMPECTOMY Right    BREAST LUMPECTOMY WITH NEEDLE LOCALIZATION AND AXILLARY SENTINEL LYMPH NODE BX Right 11/09/2013   Procedure: BREAST LUMPECTOMY WITH NEEDLE LOCALIZATION AND AXILLARY SENTINEL LYMPH NODE BIOPSY;  Surgeon: Shelly Rubenstein, MD;  Location: [facility] OR;  Service: General;  Laterality: Right;   BREAST SURGERY Right    lumpectomy   CATARACT EXTRACTION W/PHACO Right 06/15/2022   Procedure: CATARACT EXTRACTION PHACO AND INTRAOCULAR LENS PLACEMENT (IOC);  Surgeon: Fabio Pierce, MD;  Location: [facility]  ORS;  Service: Ophthalmology;  Laterality: Right;  CDE 10.73   CATARACT EXTRACTION W/PHACO Left 06/29/2022   Procedure: CATARACT EXTRACTION PHACO AND INTRAOCULAR LENS PLACEMENT (IOC);  Surgeon: Fabio Pierce, MD;  Location: [facility] ORS;  Service: Ophthalmology;  Laterality: Left;  CDE: 11.37   CHOLECYSTECTOMY     NODE DISSECTION Left 12/15/2018   Procedure: NODE DISSECTION LEFT LUNG;  Surgeon: Loreli Slot, MD;  Location: [facility] OR;  Service: Thoracic;  Laterality: Left;   PORTACATH PLACEMENT Right  01/19/2019   Procedure: INSERTION PORT-A-CATH;  Surgeon: Franky Macho, MD;  Location: [facility] ORS;  Service: General;  Laterality: Right;   VIDEO ASSISTED THORACOSCOPY (VATS)/ LOBECTOMY Left 12/15/2018   Procedure: VIDEO ASSISTED THORACOSCOPY (VATS)/LEFT UPPER LOBECTOMY;  Surgeon: Loreli Slot, MD;  Location: [facility] OR;  Service: Thoracic;  Laterality: Left;    Current Outpatient Medications  Medication Sig Dispense Refill   albuterol (PROVENTIL HFA;VENTOLIN HFA) 108 (90 BASE) MCG/ACT inhaler Inhale 2 puffs into the lungs 3 (three) times daily. Also use the inhaler every 2 hours if needed for worsening shortness of breath and wheezing. 1 Inhaler 3   albuterol (PROVENTIL) (2.5 MG/3ML) 0.083% nebulizer solution Take 3 mLs (2.5 mg total) by nebulization every 2 (two) hours as needed for shortness of breath. 75 mL 12   azelastine (ASTELIN) 0.1 % nasal spray Place 1 spray into both nostrils daily.     budesonide (PULMICORT) 0.25 MG/2ML nebulizer solution Take 2 mLs (0.25 mg total) by nebulization 2 (two) times daily. 60 mL 2   Calcium 600-200 MG-UNIT per tablet Take 1 tablet by mouth daily.     clonazePAM (KLONOPIN) 0.5 MG tablet Take 0.5 mg by mouth at bedtime.     FLUoxetine (PROZAC) 40 MG capsule Take 40 mg by mouth daily.     gabapentin (NEURONTIN) 300 MG capsule Take 300 mg by mouth 2 (two) times daily.     hydrochlorothiazide (HYDRODIURIL) 12.5 MG tablet Take 1 tablet (12.5 mg total) by mouth daily. 30 tablet 11   metoprolol succinate (TOPROL-XL) 50 MG 24 hr tablet Take 50 mg by mouth daily.     nitroGLYCERIN (NITROSTAT) 0.4 MG SL tablet Place 1 tablet (0.4 mg total) under the tongue every 5 (five) minutes as needed for chest pain. 25 tablet 3   olmesartan (BENICAR) 40 MG tablet take 1 tablet by mouth once daily. (Patient taking differently: Take 40 mg by mouth daily. take 1 tablet by mouth once daily.) 30 tablet 0   omeprazole (PRILOSEC) 40 MG capsule Take 40 mg by mouth daily.       rosuvastatin (CRESTOR) 20 MG tablet Take 20 mg by mouth at bedtime.     tiZANidine (ZANAFLEX) 2 MG tablet Take 2 mg by mouth 2 (two) times daily.     traMADol (ULTRAM) 50 MG tablet Take 50 mg by mouth 2 (two) times daily as needed.     No current facility-administered medications for this visit.    Allergies as of 02/20/2024   (No Known Allergies)    Family History  Problem Relation Age of Onset   Lung cancer Mother 1       non smoker   Rheum arthritis Mother    Pancreatic cancer Father 79       d. 46   Leukemia Sister        dx in her 42s   Heart disease Sister    Colon cancer Sister        dx and died in her  60s   Heart disease Sister    Lung cancer Maternal Aunt        non-smoker   Heart disease Sister    Breast cancer Sister        dx early 76s   Heart disease Brother    Cirrhosis Brother        d. 64   Aneurysm Brother        d. 55s   Fibromyalgia Daughter    Kidney cancer Nephew 64   Kidney cancer Nephew        dx in 30s; great nephew   Leukemia Nephew    Liver disease Nephew    Other Niece        white matter on brain; dx late 34s   Leukemia Nephew     Social History   Socioeconomic History   Marital status: Married    Spouse name: Not on file   Number of children: 3   Years of education: Not on file   Highest education level: Not on file  Occupational History   Occupation: Engineer, maintenance (IT)    Comment: Oceanographer, retired    Comment: Programmer, applications    Comment: [employer]  Tobacco Use   Smoking status: Former    Current packs/day: 0.00    Average packs/day: 0.5 packs/day for 43.0 years (21.5 ttl pk-yrs)    Types: Cigarettes    Start date: 10/25/1975    Quit date: 10/24/2018    Years since quitting: 5.3   Smokeless tobacco: Never  Vaping Use   Vaping status: Never Used  Substance and Sexual Activity   Alcohol use: Never   Drug use: No   Sexual activity: Not Currently    Birth control/protection: Surgical    Comment: hyst  Other Topics  Concern   Not on file  Social History Narrative   Lives with husband   Caffeine-- coffee 2 c daily   Social Drivers of Health   Financial Resource Strain: Low Risk  (11/24/2018)   Overall Financial Resource Strain (CARDIA)    Difficulty of Paying Living Expenses: Not hard at all  Food Insecurity: No Food Insecurity (01/02/2024)   Hunger Vital Sign    Worried About Running Out of Food in the Last Year: Never true    Ran Out of Food in the Last Year: Never true  Transportation Needs: No Transportation Needs (01/02/2024)   PRAPARE - Administrator, Civil Service (Medical): No    Lack of Transportation (Non-Medical): No  Physical Activity: Inactive (11/24/2018)   Exercise Vital Sign    Days of Exercise per Week: 0 days    Minutes of Exercise per Session: 0 min  Stress: Stress Concern Present (11/24/2018)   [employer] of Occupational Health - Occupational Stress Questionnaire    Feeling of Stress : Rather much  Social Connections: Socially Integrated (01/02/2024)   Social Connection and Isolation Panel [NHANES]    Frequency of Communication with Friends and Family: More than three times a week    Frequency of Social Gatherings with Friends and Family: More than three times a week    Attends Religious Services: More than 4 times per year    Active Member of [employer] or Organizations: Yes    Attends Banker Meetings: More than 4 times per year    Marital Status: Married  Catering manager Violence: Not At Risk (01/02/2024)   Humiliation, Afraid, Rape, and Kick questionnaire    Fear of Current or Ex-Partner:  No    Emotionally Abused: No    Physically Abused: No    Sexually Abused: No    Subjective: Review of Systems  Constitutional:  Negative for chills and fever.  HENT:  Negative for congestion and hearing loss.   Eyes:  Negative for blurred vision and double vision.  Respiratory:  Negative for cough and shortness of breath.   Cardiovascular:  Negative for  chest pain and palpitations.  Gastrointestinal:  Positive for constipation and heartburn. Negative for abdominal pain, blood in stool, diarrhea, melena and vomiting.  Genitourinary:  Negative for dysuria and urgency.  Musculoskeletal:  Negative for joint pain and myalgias.  Skin:  Negative for itching and rash.  Neurological:  Negative for dizziness and headaches.  Psychiatric/Behavioral:  Negative for depression. The patient is not nervous/anxious.        Objective: BP 138/78 (BP Location: Right Arm, Patient Position: Sitting, Cuff Size: Large)   Pulse 82   Temp 98.1 F (36.7 C) (Oral)   Ht 5\' 2"  (1.575 m)   Wt 199 lb 12.8 oz (90.6 kg)   SpO2 92%   BMI 36.54 kg/m  Physical Exam Constitutional:      Appearance: Normal appearance.  HENT:     Head: Normocephalic and atraumatic.  Eyes:     Extraocular Movements: Extraocular movements intact.     Conjunctiva/sclera: Conjunctivae normal.  Cardiovascular:     Rate and Rhythm: Normal rate and regular rhythm.  Pulmonary:     Effort: Pulmonary effort is normal.     Breath sounds: Normal breath sounds.  Abdominal:     General: Bowel sounds are normal.     Palpations: Abdomen is soft.  Musculoskeletal:        General: No swelling. Normal range of motion.     Cervical back: Normal range of motion and neck supple.  Skin:    General: Skin is warm and dry.     Coloration: Skin is not jaundiced.  Neurological:     General: No focal deficit present.     Mental Status: She is alert and oriented to person, place, and time.  Psychiatric:        Mood and Affect: Mood normal.        Behavior: Behavior normal.      Assessment/Plan:  1.  Questionable cirrhosis-blood work reassuring. Fibrosis 4 Score = 1.59  Fib-4 interpretation is not validated for people under 35 or over 24 years of age. However, scores under 2.0 are generally considered low risk.  She does have risk factors for MASLD including obesity, dyslipidemia,  hypertension.  Will order right upper quadrant ultrasound with elastography to further evaluate.  May need to perform further serological workup depending on results.  Recommend 1-2# weight loss per week until ideal body weight through exercise & diet. Low fat/cholesterol diet.   Avoid sweets, sodas, fruit juices, sweetened beverages like tea, etc. Gradually increase exercise from 15 min daily up to 1 hr per day 5 days/week. Limit alcohol use.  2.  Chronic GERD-well-controlled omeprazole daily.  Will continue.  3.  Chronic constipation-mild, tramadol likely playing a role.  Well-controlled on Dulcolax as needed.  Will continue.  4.  Colon cancer screening-patient reports getting regular Cologuard testing through her PCP.  Continue.  Follow-up in 3 months   02/20/2024 9:43 AM   Disclaimer: This note was dictated with voice recognition software. Similar sounding words can inadvertently be transcribed and may not be corrected upon review.

## 2024-02-21 ENCOUNTER — Encounter: Payer: Self-pay | Admitting: Internal Medicine

## 2024-03-04 ENCOUNTER — Ambulatory Visit (HOSPITAL_COMMUNITY)
Admission: RE | Admit: 2024-03-04 | Discharge: 2024-03-04 | Disposition: A | Discharge status: Home / Self Care | Source: Ambulatory Visit | Attending: Internal Medicine | Admitting: Internal Medicine

## 2024-03-04 ENCOUNTER — Other Ambulatory Visit (HOSPITAL_COMMUNITY): Payer: Self-pay | Admitting: Internal Medicine

## 2024-03-04 DIAGNOSIS — Z1231 Encounter for screening mammogram for malignant neoplasm of breast: Secondary | ICD-10-CM

## 2024-03-04 DIAGNOSIS — K760 Fatty (change of) liver, not elsewhere classified: Secondary | ICD-10-CM | POA: Diagnosis not present

## 2024-03-06 ENCOUNTER — Ambulatory Visit: Payer: Medicare HMO | Attending: Cardiology | Admitting: Cardiology

## 2024-03-06 ENCOUNTER — Encounter: Payer: Self-pay | Admitting: Cardiology

## 2024-03-06 VITALS — BP 130/68 | HR 79 | Ht 62.0 in | Wt 200.8 lb

## 2024-03-06 DIAGNOSIS — E782 Mixed hyperlipidemia: Secondary | ICD-10-CM

## 2024-03-06 DIAGNOSIS — I10 Essential (primary) hypertension: Secondary | ICD-10-CM | POA: Diagnosis not present

## 2024-03-06 DIAGNOSIS — R079 Chest pain, unspecified: Secondary | ICD-10-CM | POA: Diagnosis not present

## 2024-03-06 MED ORDER — ASPIRIN 81 MG PO TBEC: 81.0000 mg | DELAYED_RELEASE_TABLET | Freq: Every day | ORAL | Status: AC

## 2024-03-06 NOTE — Progress Notes (Signed)
 Clinical Summary Laura Abbott is a 78 y.o.female seen today for follow of the following medical problems.    1.History of chest pain - 12/2020 [facility] echo LVEF 60-65%, grade I dd, mild MR, mild AI, mod pulm HTN, normal RV 12/2020 nuclear stress test: possible small apical infarct, no current ischemia 12/2021 coronary CTA: calcium score 1269, mild CAD.  09/2023 echo: LVEF 60-65%, no WMAs, grade I dd. Normal RV function, mild MS mean grad 2, mild AI  No recent chest pains. Chronic SOB/DOE related to her lung disease, recent cardiac testing has been benign.       2. Lung cancer/Stage IIa - history of LUL lobectomy - Carboplatin and etoposide x 4 cycles from 01/20/2019 to 03/23/2019       3. COPD - compliant with inhalers - admit 12/2023 with COPD exacerbation, flu A +.    4. HTN - compliant with meds   5. HLD - Jan 2023 TC 159 TG 84 HDL 51 LDL 92 - 07/2023 TC 952 HDL 47 TG 132 LDL 84 - labs followed by pcp Past Medical History:  Diagnosis Date   Anxiety    Arthritis    Asthma    Breast cancer (HCC)    breast, right DCIS   COPD (chronic obstructive pulmonary disease) (HCC)    COPD exacerbation (HCC) 01/03/2014   With hypoxia   Depression    Emphysema of lung (HCC)    Family history of breast cancer    Family history of colon cancer    Family history of kidney cancer    Family history of lung cancer    Family history of pancreatic cancer    GERD (gastroesophageal reflux disease)    History of kidney stones    Hyperlipidemia    Hypertension    Insomnia    Lung cancer (HCC)    Obesity 01/05/2014   Osteoporosis    Osteoporosis, unspecified 12/01/2013   Personal history of radiation therapy    rt breast   Pneumonia    x2   Tobacco abuse 12/01/2013   Ulnar nerve impingement, left      No Known Allergies   Current Outpatient Medications  Medication Sig Dispense Refill   albuterol (PROVENTIL HFA;VENTOLIN HFA) 108 (90 BASE) MCG/ACT inhaler Inhale 2 puffs into the  lungs 3 (three) times daily. Also use the inhaler every 2 hours if needed for worsening shortness of breath and wheezing. 1 Inhaler 3   albuterol (PROVENTIL) (2.5 MG/3ML) 0.083% nebulizer solution Take 3 mLs (2.5 mg total) by nebulization every 2 (two) hours as needed for shortness of breath. 75 mL 12   azelastine (ASTELIN) 0.1 % nasal spray Place 1 spray into both nostrils daily.     budesonide (PULMICORT) 0.25 MG/2ML nebulizer solution Take 2 mLs (0.25 mg total) by nebulization 2 (two) times daily. 60 mL 2   Calcium 600-200 MG-UNIT per tablet Take 1 tablet by mouth daily.     clonazePAM (KLONOPIN) 0.5 MG tablet Take 0.5 mg by mouth at bedtime.     FLUoxetine (PROZAC) 40 MG capsule Take 40 mg by mouth daily.     gabapentin (NEURONTIN) 300 MG capsule Take 300 mg by mouth 2 (two) times daily.     hydrochlorothiazide (HYDRODIURIL) 12.5 MG tablet Take 1 tablet (12.5 mg total) by mouth daily. 30 tablet 11   metoprolol succinate (TOPROL-XL) 50 MG 24 hr tablet Take 50 mg by mouth daily.     nitroGLYCERIN (NITROSTAT) 0.4 MG SL  tablet Place 1 tablet (0.4 mg total) under the tongue every 5 (five) minutes as needed for chest pain. 25 tablet 3   olmesartan (BENICAR) 40 MG tablet take 1 tablet by mouth once daily. (Patient taking differently: Take 40 mg by mouth daily. take 1 tablet by mouth once daily.) 30 tablet 0   omeprazole (PRILOSEC) 40 MG capsule Take 40 mg by mouth daily.      rosuvastatin (CRESTOR) 20 MG tablet Take 20 mg by mouth at bedtime.     tiZANidine (ZANAFLEX) 2 MG tablet Take 2 mg by mouth 2 (two) times daily.     traMADol (ULTRAM) 50 MG tablet Take 50 mg by mouth 2 (two) times daily as needed.     No current facility-administered medications for this visit.     Past Surgical History:  Procedure Laterality Date   ABDOMINAL HYSTERECTOMY     BLADDER SURGERY     had tacked   BREAST LUMPECTOMY Right    BREAST LUMPECTOMY WITH NEEDLE LOCALIZATION AND AXILLARY SENTINEL LYMPH NODE BX Right  11/09/2013   Procedure: BREAST LUMPECTOMY WITH NEEDLE LOCALIZATION AND AXILLARY SENTINEL LYMPH NODE BIOPSY;  Surgeon: Shelly Rubenstein, MD;  Location: [facility] OR;  Service: General;  Laterality: Right;   BREAST SURGERY Right    lumpectomy   CATARACT EXTRACTION W/PHACO Right 06/15/2022   Procedure: CATARACT EXTRACTION PHACO AND INTRAOCULAR LENS PLACEMENT (IOC);  Surgeon: Fabio Pierce, MD;  Location: [facility]S;  Service: Ophthalmology;  Laterality: Right;  CDE 10.73   CATARACT EXTRACTION W/PHACO Left 06/29/2022   Procedure: CATARACT EXTRACTION PHACO AND INTRAOCULAR LENS PLACEMENT (IOC);  Surgeon: Fabio Pierce, MD;  Location: [facility];  Service: Ophthalmology;  Laterality: Left;  CDE: 11.37   CHOLECYSTECTOMY     NODE DISSECTION Left 12/15/2018   Procedure: NODE DISSECTION LEFT LUNG;  Surgeon: Loreli Slot, MD;  Location: [facility] OR;  Service: Thoracic;  Laterality: Left;   PORTACATH PLACEMENT Right 01/19/2019   Procedure: INSERTION PORT-A-CATH;  Surgeon: Franky Macho, MD;  Location: [facility]S;  Service: General;  Laterality: Right;   VIDEO ASSISTED THORACOSCOPY (VATS)/ LOBECTOMY Left 12/15/2018   Procedure: VIDEO ASSISTED THORACOSCOPY (VATS)/LEFT UPPER LOBECTOMY;  Surgeon: Loreli Slot, MD;  Location: [facility] OR;  Service: Thoracic;  Laterality: Left;     No Known Allergies    Family History  Problem Relation Age of Onset   Lung cancer Mother 52       non smoker   Rheum arthritis Mother    Pancreatic cancer Father 49       d. 74   Leukemia Sister        dx in her 40s   Heart disease Sister    Colon cancer Sister        dx and died in her 65s   Heart disease Sister    Lung cancer Maternal Aunt        non-smoker   Heart disease Sister    Breast cancer Sister        dx early 9s   Heart disease Brother    Cirrhosis Brother        d. 72   Aneurysm Brother        d. 22s   Fibromyalgia Daughter    Kidney cancer Nephew 77   Kidney cancer Nephew        dx in 30s; great nephew    Leukemia Nephew    Liver disease Nephew    Other Niece  white matter on brain; dx late 88s   Leukemia Nephew      Social History Laura Abbott reports that she quit smoking about 5 years ago. Her smoking use included cigarettes. She started smoking about 48 years ago. She has a 21.5 pack-year smoking history. She has never used smokeless tobacco. Laura Abbott reports no history of alcohol use.   Physical Examination Today's Vitals   03/06/24 0959  BP: 130/68  Pulse: 79  SpO2: 95%  Weight: 200 lb 12.8 oz (91.1 kg)  Height: 5\' 2"  (1.575 m)  PainSc: 0-No pain   Body mass index is 36.73 kg/m.  Gen: resting comfortably, no acute distress HEENT: no scleral icterus, pupils equal round and reactive, no palptable cervical adenopathy,  CV: RRR, 2/6 systolic murmur apex, no jvd Resp: Clear to auscultation bilaterally GI: abdomen is soft, non-tender, non-distended, normal bowel sounds, no hepatosplenomegaly MSK: extremities are warm, no edema.  Skin: warm, no rash Neuro:  no focal deficits Psych: appropriate affect   Diagnostic Studies 09/2023 echo 1. Left ventricular ejection fraction, by estimation, is 60 to 65%. The  left ventricle has normal function. The left ventricle has no regional  wall motion abnormalities. Left ventricular diastolic parameters are  consistent with Grade I diastolic  dysfunction (impaired relaxation).   2. Right ventricular systolic function is normal. The right ventricular  size is normal. There is normal pulmonary artery systolic pressure.   3. Left atrial size was mild to moderately dilated.   4. The mitral valve is abnormal. No evidence of mitral valve  regurgitation. Mild mitral stenosis. The mean mitral valve gradient is 2.0  mmHg. Severe mitral annular calcification.   5. The aortic valve is tricuspid. There is mild calcification of the  aortic valve. Aortic valve regurgitation is mild. No aortic stenosis is  present.   6. The inferior vena cava  is normal in size with greater than 50%  respiratory variability, suggesting right atrial pressure of 3 mmHg.   Comparison(s): No prior Echocardiogram.     Assessment and Plan  1.Chest pain - long history, ischemic testing over the years has been benign including a coronary CTA last year - no recent symptoms, continue to monitor   2. SOB/DOE - benign cardiac testing, her symptoms are lung related - no further cardiac testing at this time.    3. HTN  At goal, continue curren tmeds   4. HLD - at goal, continue current med -request pcp most recent labs   F/u 1 year     Antoine Poche, M.D.

## 2024-03-06 NOTE — Patient Instructions (Signed)
 Medication Instructions:  Your physician has recommended you make the following change in your medication:   -Start Aspirin 81 mg once daily   *If you need a refill on your cardiac medications before your next appointment, please call your pharmacy*  Lab Work: None If you have labs (blood work) drawn today and your tests are completely normal, you will receive your results only by: MyChart Message (if you have MyChart) OR A paper copy in the mail If you have any lab test that is abnormal or we need to change your treatment, we will call you to review the results.  Testing/Procedures: None  Follow-Up: At [facility], you and your health needs are our priority.  As part of our continuing mission to provide you with exceptional heart care, our providers are all part of one team.  This team includes your primary Cardiologist (physician) and Advanced Practice Providers or APPs (Physician Assistants and Nurse Practitioners) who all work together to provide you with the care you need, when you need it.  Your next appointment:   1 year(s)  Provider:   You may see Dina Rich, MD or one of the following Advanced Practice Providers on your designated Care Team:   Randall An, PA-C  Scotesia Hazen, [place] Jacolyn Reedy, [place]     We recommend signing up for the patient portal called "MyChart".  Sign up information is provided on this After Visit Summary.  MyChart is used to connect with patients for Virtual Visits (Telemedicine).  Patients are able to view lab/test results, encounter notes, upcoming appointments, etc.  Non-urgent messages can be sent to your provider as well.   To learn more about what you can do with MyChart, go to [email or web address].   Other Instructions

## 2024-03-09 ENCOUNTER — Encounter: Payer: Self-pay | Admitting: Internal Medicine

## 2024-03-12 ENCOUNTER — Encounter (HOSPITAL_COMMUNITY): Payer: Self-pay | Admitting: Internal Medicine

## 2024-03-16 ENCOUNTER — Other Ambulatory Visit (HOSPITAL_COMMUNITY): Payer: Self-pay | Admitting: Internal Medicine

## 2024-03-16 DIAGNOSIS — N63 Unspecified lump in unspecified breast: Secondary | ICD-10-CM

## 2024-03-18 ENCOUNTER — Ambulatory Visit (HOSPITAL_COMMUNITY)
Admission: RE | Admit: 2024-03-18 | Discharge: 2024-03-18 | Disposition: A | Discharge status: Home / Self Care | Source: Ambulatory Visit | Attending: Internal Medicine | Admitting: Internal Medicine

## 2024-03-18 DIAGNOSIS — Z1231 Encounter for screening mammogram for malignant neoplasm of breast: Secondary | ICD-10-CM | POA: Insufficient documentation

## 2024-04-30 DIAGNOSIS — Z0001 Encounter for general adult medical examination with abnormal findings: Secondary | ICD-10-CM | POA: Diagnosis not present

## 2024-04-30 DIAGNOSIS — Z131 Encounter for screening for diabetes mellitus: Secondary | ICD-10-CM | POA: Diagnosis not present

## 2024-04-30 DIAGNOSIS — E7801 Familial hypercholesterolemia: Secondary | ICD-10-CM | POA: Diagnosis not present

## 2024-04-30 DIAGNOSIS — E559 Vitamin D deficiency, unspecified: Secondary | ICD-10-CM | POA: Diagnosis not present

## 2024-04-30 DIAGNOSIS — I10 Essential (primary) hypertension: Secondary | ICD-10-CM | POA: Diagnosis not present

## 2024-05-01 DIAGNOSIS — M542 Cervicalgia: Secondary | ICD-10-CM | POA: Diagnosis not present

## 2024-05-01 DIAGNOSIS — M545 Low back pain, unspecified: Secondary | ICD-10-CM | POA: Diagnosis not present

## 2024-05-04 DIAGNOSIS — I10 Essential (primary) hypertension: Secondary | ICD-10-CM | POA: Diagnosis not present

## 2024-05-04 DIAGNOSIS — N3001 Acute cystitis with hematuria: Secondary | ICD-10-CM | POA: Diagnosis not present

## 2024-05-04 DIAGNOSIS — N3000 Acute cystitis without hematuria: Secondary | ICD-10-CM | POA: Diagnosis not present

## 2024-05-04 DIAGNOSIS — F32A Depression, unspecified: Secondary | ICD-10-CM | POA: Diagnosis not present

## 2024-05-04 DIAGNOSIS — E785 Hyperlipidemia, unspecified: Secondary | ICD-10-CM | POA: Diagnosis not present

## 2024-05-04 DIAGNOSIS — J189 Pneumonia, unspecified organism: Secondary | ICD-10-CM | POA: Diagnosis not present

## 2024-05-04 DIAGNOSIS — J440 Chronic obstructive pulmonary disease with acute lower respiratory infection: Secondary | ICD-10-CM | POA: Diagnosis not present

## 2024-05-04 DIAGNOSIS — R918 Other nonspecific abnormal finding of lung field: Secondary | ICD-10-CM | POA: Diagnosis not present

## 2024-05-04 DIAGNOSIS — J9811 Atelectasis: Secondary | ICD-10-CM | POA: Diagnosis not present

## 2024-05-04 DIAGNOSIS — N390 Urinary tract infection, site not specified: Secondary | ICD-10-CM | POA: Diagnosis not present

## 2024-05-04 DIAGNOSIS — A419 Sepsis, unspecified organism: Secondary | ICD-10-CM | POA: Diagnosis not present

## 2024-05-04 DIAGNOSIS — R6510 Systemic inflammatory response syndrome (SIRS) of non-infectious origin without acute organ dysfunction: Secondary | ICD-10-CM | POA: Diagnosis not present

## 2024-05-04 DIAGNOSIS — J9601 Acute respiratory failure with hypoxia: Secondary | ICD-10-CM | POA: Diagnosis not present

## 2024-05-04 DIAGNOSIS — K219 Gastro-esophageal reflux disease without esophagitis: Secondary | ICD-10-CM | POA: Diagnosis not present

## 2024-05-04 DIAGNOSIS — J449 Chronic obstructive pulmonary disease, unspecified: Secondary | ICD-10-CM | POA: Diagnosis not present

## 2024-05-04 DIAGNOSIS — R059 Cough, unspecified: Secondary | ICD-10-CM | POA: Diagnosis not present

## 2024-05-04 DIAGNOSIS — R0600 Dyspnea, unspecified: Secondary | ICD-10-CM | POA: Diagnosis not present

## 2024-05-04 DIAGNOSIS — G8929 Other chronic pain: Secondary | ICD-10-CM | POA: Diagnosis not present

## 2024-05-04 DIAGNOSIS — R0902 Hypoxemia: Secondary | ICD-10-CM | POA: Diagnosis not present

## 2024-05-04 DIAGNOSIS — J441 Chronic obstructive pulmonary disease with (acute) exacerbation: Secondary | ICD-10-CM | POA: Diagnosis not present

## 2024-05-05 DIAGNOSIS — J441 Chronic obstructive pulmonary disease with (acute) exacerbation: Secondary | ICD-10-CM | POA: Diagnosis not present

## 2024-05-05 DIAGNOSIS — R0600 Dyspnea, unspecified: Secondary | ICD-10-CM | POA: Diagnosis not present

## 2024-05-05 DIAGNOSIS — I10 Essential (primary) hypertension: Secondary | ICD-10-CM | POA: Diagnosis not present

## 2024-05-05 DIAGNOSIS — J189 Pneumonia, unspecified organism: Secondary | ICD-10-CM | POA: Diagnosis not present

## 2024-05-05 DIAGNOSIS — R6510 Systemic inflammatory response syndrome (SIRS) of non-infectious origin without acute organ dysfunction: Secondary | ICD-10-CM | POA: Diagnosis not present

## 2024-05-05 DIAGNOSIS — E785 Hyperlipidemia, unspecified: Secondary | ICD-10-CM | POA: Diagnosis not present

## 2024-05-05 DIAGNOSIS — R0902 Hypoxemia: Secondary | ICD-10-CM | POA: Diagnosis not present

## 2024-05-05 DIAGNOSIS — K219 Gastro-esophageal reflux disease without esophagitis: Secondary | ICD-10-CM | POA: Diagnosis not present

## 2024-05-05 DIAGNOSIS — G8929 Other chronic pain: Secondary | ICD-10-CM | POA: Diagnosis not present

## 2024-05-06 DIAGNOSIS — J441 Chronic obstructive pulmonary disease with (acute) exacerbation: Secondary | ICD-10-CM | POA: Diagnosis not present

## 2024-05-06 DIAGNOSIS — K219 Gastro-esophageal reflux disease without esophagitis: Secondary | ICD-10-CM | POA: Diagnosis not present

## 2024-05-06 DIAGNOSIS — R0600 Dyspnea, unspecified: Secondary | ICD-10-CM | POA: Diagnosis not present

## 2024-05-06 DIAGNOSIS — G8929 Other chronic pain: Secondary | ICD-10-CM | POA: Diagnosis not present

## 2024-05-06 DIAGNOSIS — E785 Hyperlipidemia, unspecified: Secondary | ICD-10-CM | POA: Diagnosis not present

## 2024-05-06 DIAGNOSIS — R0902 Hypoxemia: Secondary | ICD-10-CM | POA: Diagnosis not present

## 2024-05-06 DIAGNOSIS — R6510 Systemic inflammatory response syndrome (SIRS) of non-infectious origin without acute organ dysfunction: Secondary | ICD-10-CM | POA: Diagnosis not present

## 2024-05-06 DIAGNOSIS — I10 Essential (primary) hypertension: Secondary | ICD-10-CM | POA: Diagnosis not present

## 2024-05-06 DIAGNOSIS — J189 Pneumonia, unspecified organism: Secondary | ICD-10-CM | POA: Diagnosis not present

## 2024-05-07 DIAGNOSIS — R7303 Prediabetes: Secondary | ICD-10-CM | POA: Diagnosis not present

## 2024-05-07 DIAGNOSIS — J189 Pneumonia, unspecified organism: Secondary | ICD-10-CM | POA: Diagnosis not present

## 2024-05-07 DIAGNOSIS — J449 Chronic obstructive pulmonary disease, unspecified: Secondary | ICD-10-CM | POA: Diagnosis not present

## 2024-05-07 DIAGNOSIS — Z6835 Body mass index (BMI) 35.0-35.9, adult: Secondary | ICD-10-CM | POA: Diagnosis not present

## 2024-05-19 NOTE — Progress Notes (Deleted)
 Referring Provider: Trudy Vaughn FALCON, MD Primary Care Physician:  Trudy Vaughn FALCON, MD Primary GI Physician: Dr. Cindie  No chief complaint on file.   HPI:   Laura Abbott is a 78 y.o. 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   Last seen in the office 02/20/2024 at the time of initial consult.  Noted routine CT chest for history of lung cancer on 11/18/2023 noted slight nodular contour of the liver with inability to exclude cirrhosis.  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  Blood work in December with normal liver enzymes, albumin , platelets.  Noted 2 sisters with nonalcoholic cirrhosis and brother with alcoholic cirrhosis.  She denies personal history of alcohol  use.  No symptoms of decompensated liver disease.  FIB4 1.59 (<2 considered low risk).  Suspected she was likely dealing with MASLD. Recommended RUQ ultrasound with elastography and counseled on fatty liver.   RUQ ultrasound with elastography 03/04/2024 with increased echotexture of the liver, no focal lesion.  Median kPa low at 4.2, but some reduced accuracy of the test with IQR/median kPa ratio of 0.4 (greater than 0.3).    Today:    ?ELF   Labs 05/04/2024 with LFTs within normal limits.  Albumin  low at 2.9.  Platelets within normal limits.  Notably, this was during admission for sepsis in the setting of pneumonia, cystitis.   Patient following with PCP for routine Cologuard testing.   Past Medical History:  Diagnosis Date   Anxiety    Arthritis    Asthma    Breast cancer (HCC)    breast, right DCIS   COPD (chronic obstructive pulmonary disease) (HCC)    COPD exacerbation (HCC) 01/03/2014   With hypoxia   Depression    Emphysema of lung (HCC)    Family history of breast cancer     Family history of colon cancer    Family history of kidney cancer    Family history of lung cancer    Family history of pancreatic cancer    GERD (gastroesophageal reflux disease)    History of kidney stones    Hyperlipidemia    Hypertension    Insomnia    Lung cancer (HCC)    Obesity 01/05/2014   Osteoporosis    Osteoporosis, unspecified 12/01/2013   Personal history of radiation therapy    rt breast   Pneumonia    x2   Tobacco abuse 12/01/2013   Ulnar nerve impingement, left     Past Surgical History:  Procedure Laterality Date   ABDOMINAL HYSTERECTOMY     BLADDER SURGERY     had tacked   BREAST LUMPECTOMY Right    BREAST LUMPECTOMY WITH NEEDLE LOCALIZATION AND AXILLARY SENTINEL LYMPH NODE BX Right 11/09/2013   Procedure: BREAST LUMPECTOMY WITH NEEDLE LOCALIZATION AND AXILLARY SENTINEL LYMPH NODE BIOPSY;  Surgeon: Vicenta DELENA Poli, MD;  Location: [facility];  Service: General;  Laterality: Right;   BREAST SURGERY Right    lumpectomy   CATARACT EXTRACTION W/PHACO Right 06/15/2022   Procedure: CATARACT EXTRACTION PHACO AND INTRAOCULAR LENS PLACEMENT (IOC);  Surgeon: Harrie Agent, MD;  Location: [facility];  Service: Ophthalmology;  Laterality: Right;  CDE 10.73   CATARACT EXTRACTION W/PHACO Left 06/29/2022   Procedure: CATARACT EXTRACTION PHACO AND INTRAOCULAR LENS PLACEMENT (IOC);  Surgeon: Harrie Agent, MD;  Location: [facility];  Service:  Ophthalmology;  Laterality: Left;  CDE: 11.37   CHOLECYSTECTOMY     NODE DISSECTION Left 12/15/2018   Procedure: NODE DISSECTION LEFT LUNG;  Surgeon: Kerrin Elspeth BROCKS, MD;  Location: [facility] OR;  Service: Thoracic;  Laterality: Left;   PORTACATH PLACEMENT Right 01/19/2019   Procedure: INSERTION PORT-A-CATH;  Surgeon: Mavis Anes, MD;  Location: [facility];  Service: General;  Laterality: Right;   VIDEO ASSISTED THORACOSCOPY (VATS)/ LOBECTOMY Left 12/15/2018   Procedure: VIDEO ASSISTED THORACOSCOPY (VATS)/LEFT UPPER LOBECTOMY;  Surgeon: Kerrin Elspeth BROCKS,  MD;  Location: [facility] OR;  Service: Thoracic;  Laterality: Left;    Current Outpatient Medications  Medication Sig Dispense Refill   albuterol  (PROVENTIL  HFA;VENTOLIN  HFA) 108 (90 BASE) MCG/ACT inhaler Inhale 2 puffs into the lungs 3 (three) times daily. Also use the inhaler every 2 hours if needed for worsening shortness of breath and wheezing. 1 Inhaler 3   albuterol  (PROVENTIL ) (2.5 MG/3ML) 0.083% nebulizer solution Take 3 mLs (2.5 mg total) by nebulization every 2 (two) hours as needed for shortness of breath. 75 mL 12   aspirin  EC 81 MG tablet Take 1 tablet (81 mg total) by mouth daily. Swallow whole.     azelastine (ASTELIN) 0.1 % nasal spray Place 1 spray into both nostrils daily.     budesonide  (PULMICORT ) 0.25 MG/2ML nebulizer solution Take 2 mLs (0.25 mg total) by nebulization 2 (two) times daily. 60 mL 2   Calcium  600-200 MG-UNIT per tablet Take 1 tablet by mouth daily.     clonazePAM  (KLONOPIN ) 0.5 MG tablet Take 0.5 mg by mouth at bedtime.     FLUoxetine  (PROZAC ) 40 MG capsule Take 40 mg by mouth daily.     gabapentin  (NEURONTIN ) 300 MG capsule Take 300 mg by mouth 2 (two) times daily.     hydrochlorothiazide  (HYDRODIURIL ) 12.5 MG tablet Take 1 tablet (12.5 mg total) by mouth daily. 30 tablet 11   metoprolol  succinate (TOPROL -XL) 50 MG 24 hr tablet Take 50 mg by mouth daily.     nitroGLYCERIN  (NITROSTAT ) 0.4 MG SL tablet Place 1 tablet (0.4 mg total) under the tongue every 5 (five) minutes as needed for chest pain. 25 tablet 3   olmesartan  (BENICAR ) 40 MG tablet take 1 tablet by mouth once daily. 30 tablet 0   omeprazole (PRILOSEC) 40 MG capsule Take 40 mg by mouth daily.      rosuvastatin  (CRESTOR ) 20 MG tablet Take 20 mg by mouth at bedtime.     tiZANidine  (ZANAFLEX ) 2 MG tablet Take 2 mg by mouth 2 (two) times daily.     traMADol  (ULTRAM ) 50 MG tablet Take 50 mg by mouth 2 (two) times daily as needed.     No current facility-administered medications for this visit.    Allergies  as of 05/21/2024   (No Known Allergies)    Family History  Problem Relation Age of Onset   Lung cancer Mother 23       non smoker   Rheum arthritis Mother    Pancreatic cancer Father 61       d. 15   Leukemia Sister        dx in her 41s   Heart disease Sister    Colon cancer Sister        dx and died in her 3s   Heart disease Sister    Lung cancer Maternal Aunt        non-smoker   Heart disease Sister    Breast cancer Sister  dx early 77s   Heart disease Brother    Cirrhosis Brother        d. 48   Aneurysm Brother        d. 27s   Fibromyalgia Daughter    Kidney cancer Nephew 72   Kidney cancer Nephew        dx in 30s; great nephew   Leukemia Nephew    Liver disease Nephew    Other Niece        white matter on brain; dx late 40s   Leukemia Nephew     Social History   Socioeconomic History   Marital status: Married    Spouse name: Not on file   Number of children: 3   Years of education: Not on file   Highest education level: Not on file  Occupational History   Occupation: Engineer, maintenance (IT)    Comment: Oceanographer, retired    Comment: Programmer, applications    Comment: [employer]  Tobacco Use   Smoking status: Former    Current packs/day: 0.00    Average packs/day: 0.5 packs/day for 43.0 years (21.5 ttl pk-yrs)    Types: Cigarettes    Start date: 10/25/1975    Quit date: 10/24/2018    Years since quitting: 5.5   Smokeless tobacco: Never  Vaping Use   Vaping status: Never Used  Substance and Sexual Activity   Alcohol  use: Never   Drug use: No   Sexual activity: Not Currently    Birth control/protection: Surgical    Comment: hyst  Other Topics Concern   Not on file  Social History Narrative   Lives with husband   Caffeine-- coffee 2 c daily   Social Drivers of Health   Financial Resource Strain: Low Risk  (05/05/2024)   Received from [facility]   Overall Financial Resource Strain (CARDIA)    How hard is it for you to pay for the very basics  like food, housing, medical care, and heating?: Not hard at all  Food Insecurity: No Food Insecurity (05/05/2024)   Received from [facility]   Hunger Vital Sign    Within the past 12 months, you worried that your food would run out before you got the money to buy more.: Never true    Within the past 12 months, the food you bought just didn't last and you didn't have money to get more.: Never true  Transportation Needs: No Transportation Needs (05/05/2024)   Received from [facility]   PRAPARE - Transportation    Lack of Transportation (Medical): No    Lack of Transportation (Non-Medical): No  Physical Activity: Inactive (05/05/2024)   [facility] from [facility]   Exercise Vital Sign    On average, how many days per week do you engage in moderate to strenuous exercise (like a brisk walk)?: 0 days    On average, how many minutes do you engage in exercise at this level?: 0 min  Stress: No Stress Concern Present (05/05/2024)   Received from [facility] - Occupational Stress Questionnaire    Do you feel stress - tense, restless, nervous, or anxious, or unable to sleep at night because your mind is troubled all the time - these days?: Not at all  Social Connections: Moderately Integrated (05/05/2024)   Received from [facility]   Social Connection and Isolation Panel    In a typical week, how many times do you talk  on the phone with family, friends, or neighbors?: More than three times a week    How often do you get together with friends or relatives?: More than three times a week    How often do you attend church or religious services?: More than 4 times per year    Do you belong to any clubs or organizations such as church groups, unions, fraternal or athletic groups, or school groups?: No    How often do you attend meetings of the clubs or organizations you belong to?: Never    Are you married, widowed, divorced, separated, never  married, or living with a partner?: Married    Review of Systems: Gen: Denies fever, chills, anorexia. Denies fatigue, weakness, weight loss.  CV: Denies chest pain, palpitations, syncope, peripheral edema, and claudication. Resp: Denies dyspnea at rest, cough, wheezing, coughing up blood, and pleurisy. GI: Denies vomiting blood, jaundice, and fecal incontinence.   Denies dysphagia or odynophagia. Derm: Denies rash, itching, dry skin Psych: Denies depression, anxiety, memory loss, confusion. No homicidal or suicidal ideation.  Heme: Denies bruising, bleeding, and enlarged lymph nodes.  Physical Exam: There were no vitals taken for this visit. General:   Alert and oriented. No distress noted. Pleasant and cooperative.  Head:  Normocephalic and atraumatic. Eyes:  Conjuctiva clear without scleral icterus. Heart:  S1, S2 present without murmurs appreciated. Lungs:  Clear to auscultation bilaterally. No wheezes, rales, or rhonchi. No distress.  Abdomen:  +BS, soft, non-tender and non-distended. No rebound or guarding. No HSM or masses noted. Msk:  Symmetrical without gross deformities. Normal posture. Extremities:  Without edema. Neurologic:  Alert and  oriented x4 Psych:  Normal mood and affect.    Assessment:     Plan:  ***   Josette Centers, PA-C [facility] 05/21/2024

## 2024-05-21 ENCOUNTER — Ambulatory Visit: Admitting: Gastroenterology

## 2024-05-22 ENCOUNTER — Encounter: Payer: Self-pay | Admitting: Gastroenterology

## 2024-05-25 ENCOUNTER — Ambulatory Visit (HOSPITAL_COMMUNITY)
Admission: RE | Admit: 2024-05-25 | Discharge: 2024-05-25 | Disposition: A | Discharge status: Home / Self Care | Payer: Medicare HMO | Source: Ambulatory Visit | Attending: Hematology | Admitting: Hematology

## 2024-05-25 ENCOUNTER — Inpatient Hospital Stay: Payer: Medicare HMO | Attending: Hematology

## 2024-05-25 VITALS — BP 133/40 | HR 67 | Temp 97.5°F | Resp 18

## 2024-05-25 DIAGNOSIS — Z79899 Other long term (current) drug therapy: Secondary | ICD-10-CM | POA: Insufficient documentation

## 2024-05-25 DIAGNOSIS — I700 Atherosclerosis of aorta: Secondary | ICD-10-CM | POA: Diagnosis not present

## 2024-05-25 DIAGNOSIS — R918 Other nonspecific abnormal finding of lung field: Secondary | ICD-10-CM | POA: Diagnosis not present

## 2024-05-25 DIAGNOSIS — Z902 Acquired absence of lung [part of]: Secondary | ICD-10-CM | POA: Insufficient documentation

## 2024-05-25 DIAGNOSIS — C3400 Malignant neoplasm of unspecified main bronchus: Secondary | ICD-10-CM | POA: Insufficient documentation

## 2024-05-25 DIAGNOSIS — D3A8 Other benign neuroendocrine tumors: Secondary | ICD-10-CM | POA: Diagnosis not present

## 2024-05-25 DIAGNOSIS — C3412 Malignant neoplasm of upper lobe, left bronchus or lung: Secondary | ICD-10-CM | POA: Insufficient documentation

## 2024-05-25 LAB — CBC WITH DIFFERENTIAL/PLATELET
Abs Immature Granulocytes: 0.03 10*3/uL (ref 0.00–0.07)
Basophils Absolute: 0 10*3/uL (ref 0.0–0.1)
Basophils Relative: 1 %
Eosinophils Absolute: 0.1 10*3/uL (ref 0.0–0.5)
Eosinophils Relative: 1 %
HCT: 36.2 % (ref 36.0–46.0)
Hemoglobin: 11.3 g/dL — ABNORMAL LOW (ref 12.0–15.0)
Immature Granulocytes: 0 %
Lymphocytes Relative: 18 %
Lymphs Abs: 1.5 10*3/uL (ref 0.7–4.0)
MCH: 28.2 pg (ref 26.0–34.0)
MCHC: 31.2 g/dL (ref 30.0–36.0)
MCV: 90.3 fL (ref 80.0–100.0)
Monocytes Absolute: 0.4 10*3/uL (ref 0.1–1.0)
Monocytes Relative: 5 %
Neutro Abs: 6.5 10*3/uL (ref 1.7–7.7)
Neutrophils Relative %: 75 %
Platelets: 330 10*3/uL (ref 150–400)
RBC: 4.01 MIL/uL (ref 3.87–5.11)
RDW: 16.5 % — ABNORMAL HIGH (ref 11.5–15.5)
WBC: 8.6 10*3/uL (ref 4.0–10.5)
nRBC: 0 % (ref 0.0–0.2)

## 2024-05-25 LAB — COMPREHENSIVE METABOLIC PANEL WITH GFR
ALT: 19 U/L (ref 0–44)
AST: 27 U/L (ref 15–41)
Albumin: 3.4 g/dL — ABNORMAL LOW (ref 3.5–5.0)
Alkaline Phosphatase: 57 U/L (ref 38–126)
Anion gap: 9 (ref 5–15)
BUN: 18 mg/dL (ref 8–23)
CO2: 25 mmol/L (ref 22–32)
Calcium: 9 mg/dL (ref 8.9–10.3)
Chloride: 104 mmol/L (ref 98–111)
Creatinine, Ser: 0.67 mg/dL (ref 0.44–1.00)
GFR, Estimated: >60 mL/min (ref >60)
Glucose, Bld: 151 mg/dL — ABNORMAL HIGH (ref 70–99)
Potassium: 3.5 mmol/L (ref 3.5–5.1)
Sodium: 138 mmol/L (ref 135–145)
Total Bilirubin: 0.9 mg/dL (ref 0.0–1.2)
Total Protein: 7.3 g/dL (ref 6.5–8.1)

## 2024-05-25 MED ORDER — HEPARIN SOD (PORK) LOCK FLUSH 100 UNIT/ML IV SOLN
500.0000 [IU] | Freq: Once | INTRAVENOUS | Status: AC
  Administered 2024-05-25: 500 [IU] via INTRAVENOUS

## 2024-05-25 MED ORDER — SODIUM CHLORIDE 0.9% FLUSH
10.0000 mL | Freq: Once | INTRAVENOUS | Status: AC
  Administered 2024-05-25: 10 mL via INTRAVENOUS

## 2024-05-25 NOTE — Progress Notes (Signed)
 Patients port flushed without difficulty.  Good blood return noted with no bruising or swelling noted at site.  Band aid applied.  VSS with discharge and left in satisfactory condition with no s/s of distress noted.

## 2024-05-25 NOTE — Patient Instructions (Signed)
 [facility] [place] - A DEPT OF [facility]  Discharge Instructions: Thank you for choosing [facility] to provide your oncology and hematology care.  If you have a lab appointment with the Cancer Center - please note that after April 8th, 2024, all labs will be drawn in the cancer center.  You do not have to check in or register with the main entrance as you have in the past but will complete your check-in in the cancer center.  Wear comfortable clothing and clothing appropriate for easy access to any Portacath or PICC line.   We strive to give you quality time with your provider. You may need to reschedule your appointment if you arrive late (15 or more minutes).  Arriving late affects you and other patients whose appointments are after yours.  Also, if you miss three or more appointments without notifying the office, you may be dismissed from the clinic at the provider's discretion.      For prescription refill requests, have your pharmacy contact our office and allow 72 hours for refills to be completed.    Today you received the following chemotherapy and/or immunotherapy agents port flush      To help prevent nausea and vomiting after your treatment, we encourage you to take your nausea medication as directed.  BELOW ARE SYMPTOMS THAT SHOULD BE REPORTED IMMEDIATELY: *FEVER GREATER THAN 100.4 F (38 C) OR HIGHER *CHILLS OR SWEATING *NAUSEA AND VOMITING THAT IS NOT CONTROLLED WITH YOUR NAUSEA MEDICATION *UNUSUAL SHORTNESS OF BREATH *UNUSUAL BRUISING OR BLEEDING *URINARY PROBLEMS (pain or burning when urinating, or frequent urination) *BOWEL PROBLEMS (unusual diarrhea, constipation, pain near the anus) TENDERNESS IN MOUTH AND THROAT WITH OR WITHOUT PRESENCE OF ULCERS (sore throat, sores in mouth, or a toothache) UNUSUAL RASH, SWELLING OR PAIN  UNUSUAL VAGINAL DISCHARGE OR ITCHING   Items with * indicate a potential emergency and should be followed  up as soon as possible or go to the Emergency Department if any problems should occur.  Please show the CHEMOTHERAPY ALERT CARD or IMMUNOTHERAPY ALERT CARD at check-in to the Emergency Department and triage nurse.  Should you have questions after your visit or need to cancel or reschedule your appointment, please contact [facility] [place] - A DEPT OF [facility] [phone]  and follow the prompts.  Office hours are 8:00 a.m. to 4:30 p.m. Monday - Friday. Please note that voicemails left after 4:00 p.m. may not be returned until the following business day.  We are closed weekends and major holidays. You have access to a nurse at all times for urgent questions. Please call the main number to the clinic [phone] and follow the prompts.  For any non-urgent questions, you may also contact your provider using MyChart. We now offer e-Visits for anyone 38 and older to request care online for non-urgent symptoms. For details visit [email or web address].   Also download the MyChart app! Go to the app store, search "MyChart", open the app, select [place], and log in with your MyChart username and password.

## 2024-06-01 ENCOUNTER — Inpatient Hospital Stay: Payer: Medicare HMO | Attending: Hematology | Admitting: Hematology

## 2024-06-01 VITALS — BP 133/62 | HR 75 | Temp 97.9°F | Resp 18 | Ht 62.5 in | Wt 189.0 lb

## 2024-06-01 DIAGNOSIS — Z87891 Personal history of nicotine dependence: Secondary | ICD-10-CM | POA: Insufficient documentation

## 2024-06-01 DIAGNOSIS — Z8379 Family history of other diseases of the digestive system: Secondary | ICD-10-CM | POA: Insufficient documentation

## 2024-06-01 DIAGNOSIS — Z801 Family history of malignant neoplasm of trachea, bronchus and lung: Secondary | ICD-10-CM | POA: Diagnosis not present

## 2024-06-01 DIAGNOSIS — Z800 Family history of malignant neoplasm of digestive organs: Secondary | ICD-10-CM | POA: Insufficient documentation

## 2024-06-01 DIAGNOSIS — Z902 Acquired absence of lung [part of]: Secondary | ICD-10-CM | POA: Diagnosis not present

## 2024-06-01 DIAGNOSIS — E785 Hyperlipidemia, unspecified: Secondary | ICD-10-CM | POA: Insufficient documentation

## 2024-06-01 DIAGNOSIS — C3400 Malignant neoplasm of unspecified main bronchus: Secondary | ICD-10-CM

## 2024-06-01 DIAGNOSIS — Z90710 Acquired absence of both cervix and uterus: Secondary | ICD-10-CM | POA: Insufficient documentation

## 2024-06-01 DIAGNOSIS — Z7982 Long term (current) use of aspirin: Secondary | ICD-10-CM | POA: Insufficient documentation

## 2024-06-01 DIAGNOSIS — D649 Anemia, unspecified: Secondary | ICD-10-CM | POA: Insufficient documentation

## 2024-06-01 DIAGNOSIS — I2510 Atherosclerotic heart disease of native coronary artery without angina pectoris: Secondary | ICD-10-CM | POA: Diagnosis not present

## 2024-06-01 DIAGNOSIS — J4489 Other specified chronic obstructive pulmonary disease: Secondary | ICD-10-CM | POA: Diagnosis not present

## 2024-06-01 DIAGNOSIS — F32A Depression, unspecified: Secondary | ICD-10-CM | POA: Insufficient documentation

## 2024-06-01 DIAGNOSIS — I10 Essential (primary) hypertension: Secondary | ICD-10-CM | POA: Diagnosis not present

## 2024-06-01 DIAGNOSIS — Z9049 Acquired absence of other specified parts of digestive tract: Secondary | ICD-10-CM | POA: Insufficient documentation

## 2024-06-01 DIAGNOSIS — C3412 Malignant neoplasm of upper lobe, left bronchus or lung: Secondary | ICD-10-CM | POA: Diagnosis not present

## 2024-06-01 DIAGNOSIS — I700 Atherosclerosis of aorta: Secondary | ICD-10-CM | POA: Insufficient documentation

## 2024-06-01 DIAGNOSIS — Z8261 Family history of arthritis: Secondary | ICD-10-CM | POA: Insufficient documentation

## 2024-06-01 DIAGNOSIS — F419 Anxiety disorder, unspecified: Secondary | ICD-10-CM | POA: Diagnosis not present

## 2024-06-01 DIAGNOSIS — Z806 Family history of leukemia: Secondary | ICD-10-CM | POA: Diagnosis not present

## 2024-06-01 DIAGNOSIS — Z87442 Personal history of urinary calculi: Secondary | ICD-10-CM | POA: Insufficient documentation

## 2024-06-01 DIAGNOSIS — Z79899 Other long term (current) drug therapy: Secondary | ICD-10-CM | POA: Insufficient documentation

## 2024-06-01 DIAGNOSIS — Z803 Family history of malignant neoplasm of breast: Secondary | ICD-10-CM | POA: Diagnosis not present

## 2024-06-01 DIAGNOSIS — Z8249 Family history of ischemic heart disease and other diseases of the circulatory system: Secondary | ICD-10-CM | POA: Diagnosis not present

## 2024-06-01 NOTE — Patient Instructions (Signed)
 [facility] at [facility] Discharge Instructions   You were seen and examined today by Dr. Rogers.  He reviewed the results of your lab work which are normal/stable.   We will see you back in 1 year. We will repeat lab work and a CT scan prior to this visit.    Return as scheduled.    Thank you for choosing [facility] at [facility] to provide your oncology and hematology care.  To afford each patient quality time with our provider, please arrive at least 15 minutes before your scheduled appointment time.   If you have a lab appointment with the Cancer Center please come in thru the Main Entrance and check in at the main information desk.  You need to re-schedule your appointment should you arrive 10 or more minutes late.  We strive to give you quality time with our providers, and arriving late affects you and other patients whose appointments are after yours.  Also, if you no show three or more times for appointments you may be dismissed from the clinic at the providers discretion.     Again, thank you for choosing [facility].  Our hope is that these requests will decrease the amount of time that you wait before being seen by our physicians.       _____________________________________________________________  Should you have questions after your visit to [facility], please contact our office at [phone] and follow the prompts.  Our office hours are 8:00 a.m. and 4:30 p.m. Monday - Friday.  Please note that voicemails left after 4:00 p.m. may not be returned until the following business day.  We are closed weekends and major holidays.  You do have access to a nurse 24-7, just call the main number to the clinic [phone] and do not press any options, hold on the line and a nurse will answer the phone.    For prescription refill requests, have your pharmacy contact our office and allow 72 hours.    Due to  Covid, you will need to wear a mask upon entering the hospital. If you do not have a mask, a mask will be given to you at the Main Entrance upon arrival. For doctor visits, patients may have 1 support person age 5 or older with them. For treatment visits, patients can not have anyone with them due to social distancing guidelines and our immunocompromised population.

## 2024-06-01 NOTE — Progress Notes (Signed)
 [facility] [address] [place] [address]    Clinic Day:  06/01/2024  Referring physician: Trudy Vaughn FALCON, MD  Patient Care Team: Trudy Vaughn FALCON, MD as PCP - General (Internal Medicine) Alvan Dorn FALCON, MD as PCP - Cardiology (Cardiology) Lonn Hicks, MD as Consulting Physician (Hematology and Oncology)   ASSESSMENT & PLAN:   Assessment: 1.  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  She was found to have an abnormal chest x-ray followed by CT scan which showed left upper lobe lung mass. - She smoked 1 to 2 packs of cigarettes per day for the last 50 years.  She quit smoking on 10/23/2018. - Denies any significant weight loss.  But has night sweats occasionally.  Denies any hemoptysis. - PET CT scan on 11/17/2018 showed left upper lobe lung mass, 4.9 cm, no evidence of adenopathy or metastasis.  MRI of the brain was negative for intracranial mets. - Left upper lobectomy and lymph node biopsy on 12/15/2018.  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   -Cancer type ID testing shows 96% probability of neuroendocrine carcinoma (small cell/large cell carcinoma). -4 cycles of adjuvant chemotherapy with carboplatin  and etoposide  from 01/20/2019 through 03/23/2019.    2.  Family history: -Mother had lung cancer and was never smoker.  Father had pancreatic cancer. -1 sister has colon cancer in 1 sister with leukemia.  Another sister had breast cancer. -[employer] testing on 12/09/2018 was negative for hereditary malignancies.  Plan:  1.  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  She is recovering well.  She has come off of oxygen . - Reviewed labs from 05/25/2024:  Normal LFTs and creatinine.  CBC grossly normal with mild anemia. - CT chest on 05/25/2024: Stable postsurgical changes.  New bandlike opacity posteriorly in the right middle lobe, likely postinflammatory scarring.  Stable contour irregularity of the liver suspicious for cirrhosis. - She completed 5 years since adjuvant chemotherapy.  We will switch her to once a year visits with CT chest without contrast and labs.   Orders Placed This Encounter  Procedures   CT CHEST WO CONTRAST    Standing Status:   Future    Expected Date:   05/25/2025    Expiration Date:   05/25/2025    Preferred imaging location?:   [facility]   CBC with Differential    Standing Status:   Future    Expected Date:   05/25/2025    Expiration Date:   05/25/2025   Comprehensive metabolic panel    Standing Status:   Future    Expected Date:   05/25/2025    Expiration Date:   05/25/2025     LILLETTE Hummingbird R Teague,acting as a scribe for Alean Stands, MD.,have documented all relevant documentation on the behalf of Alean Stands, MD,as directed by  Alean Stands, MD while in the presence of Alean Stands, MD.  I, Alean Stands MD, have reviewed the above documentation for accuracy and completeness, and I agree with the above.    Alean Stands, MD   7/7/202511:53 AM  CHIEF COMPLAINT:   Diagnosis: left neuroendocrine lung cancer    Cancer Staging  DCIS (ductal carcinoma in situ) of breast Staging  form: Breast, AJCC 7th Edition - Clinical stage from 12/01/2013: Stage 0 (Tis (DCIS), N0, cM0) - Signed by Lonn Hicks, MD on 12/01/2013 - Pathologic: Stage 0 (Tis (DCIS), N0, cM0) - Signed by Lonn Hicks, MD on 12/01/2013    Prior Therapy: 1. Left upper lobectomy on 12/15/2018. 2. Carboplatin  and etoposide  x 4 cycles from 01/20/2019 to 03/23/2019.  Current Therapy:  surveillance    HISTORY OF PRESENT ILLNESS:   Oncology History  DCIS (ductal carcinoma in situ) of breast  10/28/2013  Initial Diagnosis   DCIS (ductal carcinoma in situ) of breast   12/21/2018 Genetic Testing   Negative genetic testing on the multicancer panel.  The Multi-Gene Panel offered by [employer] includes sequencing and/or deletion duplication testing of the following 84 genes: AIP, ALK, APC, ATM, AXIN2,BAP1,  BARD1, BLM, BMPR1A, BRCA1, BRCA2, BRIP1, CASR, CDC73, CDH1, CDK4, CDKN1B, CDKN1C, CDKN2A (p14ARF), CDKN2A (p16INK4a), CEBPA, CHEK2, CTNNA1, DICER1, DIS3L2, EGFR (c.2369C>T, p.Thr790Met variant only), EPCAM (Deletion/duplication testing only), FH, FLCN, GATA2, GPC3, GREM1 (Promoter region deletion/duplication testing only), HOXB13 (c.251G>A, p.Gly84Glu), HRAS, KIT, MAX, MEN1, MET, MITF (c.952G>A, p.Glu318Lys variant only), MLH1, MSH2, MSH3, MSH6, MUTYH, NBN, NF1, NF2, NTHL1, PALB2, PDGFRA, PHOX2B, PMS2, POLD1, POLE, POT1, PRKAR1A, PTCH1, PTEN, RAD50, RAD51C, RAD51D, RB1, RECQL4, RET, RUNX1, SDHAF2, SDHA (sequence changes only), SDHB, SDHC, SDHD, SMAD4, SMARCA4, SMARCB1, SMARCE1, STK11, SUFU, TERC, TERT, TMEM127, TP53, TSC1, TSC2, VHL, WRN and WT1.  The report date is 12/21/2018.   Lung cancer (HCC)  12/15/2018 Initial Diagnosis   Lung cancer (HCC)   12/21/2018 Genetic Testing   Negative genetic testing on the multicancer panel.  The Multi-Gene Panel offered by [employer] includes sequencing and/or deletion duplication testing of the following 84 genes: AIP, ALK, APC, ATM, AXIN2,BAP1,  BARD1, BLM, BMPR1A, BRCA1, BRCA2, BRIP1, CASR, CDC73, CDH1, CDK4, CDKN1B, CDKN1C, CDKN2A (p14ARF), CDKN2A (p16INK4a), CEBPA, CHEK2, CTNNA1, DICER1, DIS3L2, EGFR (c.2369C>T, p.Thr790Met variant only), EPCAM (Deletion/duplication testing only), FH, FLCN, GATA2, GPC3, GREM1 (Promoter region deletion/duplication testing only), HOXB13 (c.251G>A, p.Gly84Glu), HRAS, KIT, MAX, MEN1, MET, MITF (c.952G>A, p.Glu318Lys variant only), MLH1, MSH2, MSH3, MSH6, MUTYH, NBN, NF1, NF2, NTHL1, PALB2, PDGFRA, PHOX2B, PMS2, POLD1, POLE, POT1, PRKAR1A, PTCH1,  PTEN, RAD50, RAD51C, RAD51D, RB1, RECQL4, RET, RUNX1, SDHAF2, SDHA (sequence changes only), SDHB, SDHC, SDHD, SMAD4, SMARCA4, SMARCB1, SMARCE1, STK11, SUFU, TERC, TERT, TMEM127, TP53, TSC1, TSC2, VHL, WRN and WT1.  The report date is 12/21/2018.   Primary spindle cell carcinoma of lung (HCC)  12/20/2018 Initial Diagnosis   Primary spindle cell carcinoma of lung (HCC)   12/21/2018 Genetic Testing   Negative genetic testing on the multicancer panel.  The Multi-Gene Panel offered by [facility] includes sequencing and/or deletion duplication testing of the following 84 genes: AIP, ALK, APC, ATM, AXIN2,BAP1,  BARD1, BLM, BMPR1A, BRCA1, BRCA2, BRIP1, CASR, CDC73, CDH1, CDK4, CDKN1B, CDKN1C, CDKN2A (p14ARF), CDKN2A (p16INK4a), CEBPA, CHEK2, CTNNA1, DICER1, DIS3L2, EGFR (c.2369C>T, p.Thr790Met variant only), EPCAM (Deletion/duplication testing only), FH, FLCN, GATA2, GPC3, GREM1 (Promoter region deletion/duplication testing only), HOXB13 (c.251G>A, p.Gly84Glu), HRAS, KIT, MAX, MEN1, MET, MITF (c.952G>A, p.Glu318Lys variant only), MLH1, MSH2, MSH3, MSH6, MUTYH, NBN, NF1, NF2, NTHL1, PALB2, PDGFRA, PHOX2B, PMS2, POLD1, POLE, POT1, PRKAR1A, PTCH1, PTEN, RAD50, RAD51C, RAD51D, RB1, RECQL4, RET, RUNX1, SDHAF2, SDHA (sequence changes only), SDHB, SDHC, SDHD, SMAD4, SMARCA4, SMARCB1, SMARCE1, STK11, SUFU, TERC, TERT, TMEM127, TP53, TSC1, TSC2, VHL, WRN and WT1.  The report date is 12/21/2018.   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HCC)  01/12/2019 Initial Diagnosis   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HCC)   01/20/2019 -  03/26/2019 Chemotherapy   Patient is on Treatment Plan : LUNG SMALL CELL Carboplatin  D1 / Etoposide  D1-3 q21d        INTERVAL HISTORY:   Laura Abbott is a 78 y.o. female seen for follow-up of left neuroendocrine lung cancer. She was last seen by me on 11/25/23.  Since her last visit, she underwent CT chest on 05/25/24 that found: Stable postsurgical findings from previous left upper lobe resection. No evidence of local recurrence or  metastatic disease. New bandlike opacity posteriorly in the right middle lobe, likely an area of postinflammatory scarring. Stable contour irregularity of the liver suspicious for mild cirrhosis. Coronary and aortic Atherosclerosis.   [place] was admitted to the hospital twice. Her first admission was from 01/03/24 to 01/04/24 for COPD exacerbation in the setting of UTI and influenza A infections, treated with IV Rocephin . Ger second admission was in [facility] for sepsis with pneumonia from 05/04/24 to 05/06/24, treated with IV antibiotics and IV steroids.   Today, she states that she is doing well overall. Her appetite level is at 50%. Her energy level is at 25%.   PAST MEDICAL HISTORY:   Past Medical History: Past Medical History:  Diagnosis Date   Anxiety    Arthritis    Asthma    Breast cancer (HCC)    breast, right DCIS   COPD (chronic obstructive pulmonary disease) (HCC)    COPD exacerbation (HCC) 01/03/2014   With hypoxia   Depression    Emphysema of lung (HCC)    Family history of breast cancer    Family history of colon cancer    Family history of kidney cancer    Family history of lung cancer    Family history of pancreatic cancer    GERD (gastroesophageal reflux disease)    History of kidney stones    Hyperlipidemia    Hypertension    Insomnia    Lung cancer (HCC)    Obesity 01/05/2014   Osteoporosis    Osteoporosis, unspecified 12/01/2013   Personal history of radiation therapy    rt breast   Pneumonia    x2   Tobacco abuse 12/01/2013   Ulnar nerve impingement, left     Surgical History: Past Surgical History:  Procedure Laterality Date   ABDOMINAL HYSTERECTOMY     BLADDER SURGERY     had tacked   BREAST LUMPECTOMY Right    BREAST LUMPECTOMY WITH NEEDLE LOCALIZATION AND AXILLARY SENTINEL LYMPH NODE BX Right 11/09/2013   Procedure: BREAST LUMPECTOMY WITH NEEDLE LOCALIZATION AND AXILLARY SENTINEL LYMPH NODE BIOPSY;  Surgeon: Vicenta DELENA Poli, MD;  Location: [facility] OR;   Service: General;  Laterality: Right;   BREAST SURGERY Right    lumpectomy   CATARACT EXTRACTION W/PHACO Right 06/15/2022   Procedure: CATARACT EXTRACTION PHACO AND INTRAOCULAR LENS PLACEMENT (IOC);  Surgeon: Harrie Agent, MD;  Location: [facility] ORS;  Service: Ophthalmology;  Laterality: Right;  CDE 10.73   CATARACT EXTRACTION W/PHACO Left 06/29/2022   Procedure: CATARACT EXTRACTION PHACO AND INTRAOCULAR LENS PLACEMENT (IOC);  Surgeon: Harrie Agent, MD;  Location: [facility] ORS;  Service: Ophthalmology;  Laterality: Left;  CDE: 11.37   CHOLECYSTECTOMY     NODE DISSECTION Left 12/15/2018   Procedure: NODE DISSECTION LEFT LUNG;  Surgeon: Kerrin Elspeth BROCKS, MD;  Location: [facility] OR;  Service: Thoracic;  Laterality: Left;   PORTACATH PLACEMENT Right 01/19/2019   Procedure: INSERTION PORT-A-CATH;  Surgeon: Mavis Anes, MD;  Location: [facility] ORS;  Service: General;  Laterality: Right;   VIDEO ASSISTED  THORACOSCOPY (VATS)/ LOBECTOMY Left 12/15/2018   Procedure: VIDEO ASSISTED THORACOSCOPY (VATS)/LEFT UPPER LOBECTOMY;  Surgeon: Kerrin Elspeth BROCKS, MD;  Location: [facility] OR;  Service: Thoracic;  Laterality: Left;    Social History: Social History   Socioeconomic History   Marital status: Married    Spouse name: Not on file   Number of children: 3   Years of education: Not on file   Highest education level: Not on file  Occupational History   Occupation: Engineer, maintenance (IT)    Comment: Oceanographer, retired    Comment: Programmer, applications    Comment: [employer]  Tobacco Use   Smoking status: Former    Current packs/day: 0.00    Average packs/day: 0.5 packs/day for 43.0 years (21.5 ttl pk-yrs)    Types: Cigarettes    Start date: 10/25/1975    Quit date: 10/24/2018    Years since quitting: 5.6   Smokeless tobacco: Never  Vaping Use   Vaping status: Never Used  Substance and Sexual Activity   Alcohol  use: Never   Drug use: No   Sexual activity: Not Currently    Birth control/protection: Surgical    Comment:  hyst  Other Topics Concern   Not on file  Social History Narrative   Lives with husband   Caffeine-- coffee 2 c daily   Social Drivers of Health   Financial Resource Strain: Low Risk  (05/05/2024)   Received from [facility]   Overall Financial Resource Strain (CARDIA)    How hard is it for you to pay for the very basics like food, housing, medical care, and heating?: Not hard at all  Food Insecurity: No Food Insecurity (05/05/2024)   Received from [facility]   Hunger Vital Sign    Within the past 12 months, you worried that your food would run out before you got the money to buy more.: Never true    Within the past 12 months, the food you bought just didn't last and you didn't have money to get more.: Never true  Transportation Needs: No Transportation Needs (05/05/2024)   Received from [facility]   PRAPARE - Transportation    Lack of Transportation (Medical): No    Lack of Transportation (Non-Medical): No  Physical Activity: Inactive (05/05/2024)   Received from [facility]   Exercise Vital Sign    On average, how many days per week do you engage in moderate to strenuous exercise (like a brisk walk)?: 0 days    On average, how many minutes do you engage in exercise at this level?: 0 min  Stress: No Stress Concern Present (05/05/2024)   Received from [facility] - Occupational Stress Questionnaire    Do you feel stress - tense, restless, nervous, or anxious, or unable to sleep at night because your mind is troubled all the time - these days?: Not at all  Social Connections: Moderately Integrated (05/05/2024)   Received from [facility]   Social Connection and Isolation Panel    In a typical week, how many times do you talk on the phone with family, friends, or neighbors?: More than three times a week    How often do you get together with friends or relatives?: More than three times a week    How often do you attend  church or religious services?: More than 4 times per year    Do you belong to any clubs or organizations such as  church groups, unions, fraternal or athletic groups, or school groups?: No    How often do you attend meetings of the clubs or organizations you belong to?: Never    Are you married, widowed, divorced, separated, never married, or living with a partner?: Married  Intimate Partner Violence: Not At Risk (05/05/2024)   Received from [facility]   Humiliation, Afraid, Rape, and Kick questionnaire    Within the last year, have you been afraid of your partner or ex-partner?: No    Within the last year, have you been humiliated or emotionally abused in other ways by your partner or ex-partner?: No    Within the last year, have you been kicked, hit, slapped, or otherwise physically hurt by your partner or ex-partner?: No    Within the last year, have you been raped or forced to have any kind of sexual activity by your partner or ex-partner?: No    Family History: Family History  Problem Relation Age of Onset   Lung cancer Mother 35       non smoker   Rheum arthritis Mother    Pancreatic cancer Father 44       d. 63   Leukemia Sister        dx in her 23s   Heart disease Sister    Colon cancer Sister        dx and died in her 54s   Heart disease Sister    Lung cancer Maternal Aunt        non-smoker   Heart disease Sister    Breast cancer Sister        dx early 52s   Heart disease Brother    Cirrhosis Brother        d. 38   Aneurysm Brother        d. 76s   Fibromyalgia Daughter    Kidney cancer Nephew 45   Kidney cancer Nephew        dx in 30s; great nephew   Leukemia Nephew    Liver disease Nephew    Other Niece        white matter on brain; dx late 51s   Leukemia Nephew     Current Medications:  Current Outpatient Medications:    albuterol  (PROVENTIL  HFA;VENTOLIN  HFA) 108 (90 BASE) MCG/ACT inhaler, Inhale 2 puffs into the lungs 3 (three) times daily. Also use  the inhaler every 2 hours if needed for worsening shortness of breath and wheezing., Disp: 1 Inhaler, Rfl: 3   albuterol  (PROVENTIL ) (2.5 MG/3ML) 0.083% nebulizer solution, Take 3 mLs (2.5 mg total) by nebulization every 2 (two) hours as needed for shortness of breath., Disp: 75 mL, Rfl: 12   aspirin  EC 81 MG tablet, Take 1 tablet (81 mg total) by mouth daily. Swallow whole., Disp: , Rfl:    azelastine (ASTELIN) 0.1 % nasal spray, Place 1 spray into both nostrils daily., Disp: , Rfl:    budesonide  (PULMICORT ) 0.25 MG/2ML nebulizer solution, Take 2 mLs (0.25 mg total) by nebulization 2 (two) times daily., Disp: 60 mL, Rfl: 2   Calcium  600-200 MG-UNIT per tablet, Take 1 tablet by mouth daily., Disp: , Rfl:    clonazePAM  (KLONOPIN ) 0.5 MG tablet, Take 0.5 mg by mouth at bedtime., Disp: , Rfl:    FLUoxetine  (PROZAC ) 40 MG capsule, Take 40 mg by mouth daily., Disp: , Rfl:    gabapentin  (NEURONTIN ) 300 MG capsule, Take 300 mg by mouth 2 (two) times daily., Disp: ,  Rfl:    hydrochlorothiazide  (HYDRODIURIL ) 12.5 MG tablet, Take 1 tablet (12.5 mg total) by mouth daily., Disp: 30 tablet, Rfl: 11   ipratropium-albuterol  (DUONEB) 0.5-2.5 (3) MG/3ML SOLN, Inhale 3 mLs into the lungs., Disp: , Rfl:    metoprolol  succinate (TOPROL -XL) 50 MG 24 hr tablet, Take 50 mg by mouth daily., Disp: , Rfl:    nitroGLYCERIN  (NITROSTAT ) 0.4 MG SL tablet, Place 1 tablet (0.4 mg total) under the tongue every 5 (five) minutes as needed for chest pain., Disp: 25 tablet, Rfl: 3   olmesartan  (BENICAR ) 40 MG tablet, take 1 tablet by mouth once daily., Disp: 30 tablet, Rfl: 0   omeprazole (PRILOSEC) 40 MG capsule, Take 40 mg by mouth daily. , Disp: , Rfl:    rosuvastatin  (CRESTOR ) 20 MG tablet, Take 20 mg by mouth at bedtime., Disp: , Rfl:    tiZANidine  (ZANAFLEX ) 2 MG tablet, Take 2 mg by mouth 2 (two) times daily., Disp: , Rfl:    traMADol  (ULTRAM ) 50 MG tablet, Take 50 mg by mouth 2 (two) times daily as needed., Disp: , Rfl:     Allergies: No Known Allergies  REVIEW OF SYSTEMS:   Review of Systems  Constitutional:  Negative for chills, fatigue and fever.  HENT:   Negative for lump/mass, mouth sores, nosebleeds, sore throat and trouble swallowing.   Eyes:  Negative for eye problems.  Respiratory:  Positive for shortness of breath. Negative for cough.   Cardiovascular:  Negative for chest pain, leg swelling and palpitations.  Gastrointestinal:  Negative for abdominal pain, constipation, diarrhea, nausea and vomiting.  Genitourinary:  Negative for bladder incontinence, difficulty urinating, dysuria, frequency, hematuria and nocturia.   Musculoskeletal:  Negative for arthralgias, back pain, flank pain, myalgias and neck pain.  Skin:  Negative for itching and rash.  Neurological:  Positive for numbness. Negative for dizziness and headaches.  Hematological:  Does not bruise/bleed easily.  Psychiatric/Behavioral:  Negative for depression, sleep disturbance and suicidal ideas. The patient is not nervous/anxious.   All other systems reviewed and are negative.    VITALS:   Blood pressure 133/62, pulse 75, temperature 97.9 F (36.6 C), temperature source Tympanic, resp. rate 18, height 5' 2.5 (1.588 m), weight 189 lb (85.7 kg), SpO2 95%.  Wt Readings from Last 3 Encounters:  06/01/24 189 lb (85.7 kg)  03/06/24 200 lb 12.8 oz (91.1 kg)  02/20/24 199 lb 12.8 oz (90.6 kg)    Body mass index is 34.02 kg/m.  Performance status (ECOG): 1 - Symptomatic but completely ambulatory  PHYSICAL EXAM:   Physical Exam Vitals and nursing note reviewed. Exam conducted with a chaperone present.  Constitutional:      Appearance: Normal appearance.  Cardiovascular:     Rate and Rhythm: Normal rate and regular rhythm.     Pulses: Normal pulses.     Heart sounds: Normal heart sounds.  Pulmonary:     Effort: Pulmonary effort is normal.     Breath sounds: Normal breath sounds.  Abdominal:     Palpations: Abdomen is soft.  There is no hepatomegaly, splenomegaly or mass.     Tenderness: There is no abdominal tenderness.  Musculoskeletal:     Right lower leg: No edema.     Left lower leg: No edema.  Lymphadenopathy:     Cervical: No cervical adenopathy.     Right cervical: No superficial, deep or posterior cervical adenopathy.    Left cervical: No superficial, deep or posterior cervical adenopathy.     Upper  Body:     Right upper body: No supraclavicular or axillary adenopathy.     Left upper body: No supraclavicular or axillary adenopathy.  Neurological:     General: No focal deficit present.     Mental Status: She is alert and oriented to person, place, and time.  Psychiatric:        Mood and Affect: Mood normal.        Behavior: Behavior normal.     LABS:      Latest Ref Rng & Units 05/25/2024    9:12 AM 01/03/2024    2:52 AM 01/02/2024    2:24 PM  CBC  WBC 4.0 - 10.5 K/uL 8.6  5.7  8.3   Hemoglobin 12.0 - 15.0 g/dL 88.6  87.8  87.6   Hematocrit 36.0 - 46.0 % 36.2  38.3  38.4   Platelets 150 - 400 K/uL 330  332  317       Latest Ref Rng & Units 05/25/2024    9:12 AM 01/03/2024    2:52 AM 01/02/2024    2:24 PM  CMP  Glucose 70 - 99 mg/dL 848  818  896   BUN 8 - 23 mg/dL 18  11  10    Creatinine 0.44 - 1.00 mg/dL 9.32  9.40  9.44   Sodium 135 - 145 mmol/L 138  138  136   Potassium 3.5 - 5.1 mmol/L 3.5  3.5  3.5   Chloride 98 - 111 mmol/L 104  99  99   CO2 22 - 32 mmol/L 25  28  26    Calcium  8.9 - 10.3 mg/dL 9.0  9.3  9.1   Total Protein 6.5 - 8.1 g/dL 7.3     Total Bilirubin 0.0 - 1.2 mg/dL 0.9     Alkaline Phos 38 - 126 U/L 57     AST 15 - 41 U/L 27     ALT 0 - 44 U/L 19        No results found for: CEA1, CEA / No results found for: CEA1, CEA No results found for: PSA1 No results found for: CAN199 No results found for: CAN125  No results found for: TOTALPROTELP, ALBUMINELP, A1GS, A2GS, BETS, BETA2SER, GAMS, MSPIKE, SPEI No results found for: TIBC,  FERRITIN, IRONPCTSAT No results found for: LDH   STUDIES:   CT Chest Wo Contrast Result Date: 05/29/2024 CLINICAL DATA:  Follow-up stage II A left upper lobe neuroendocrine carcinoma. * Tracking Code: BO * EXAM: CT CHEST WITHOUT CONTRAST TECHNIQUE: Multidetector CT imaging of the chest was performed following the standard protocol without IV contrast. RADIATION DOSE REDUCTION: This exam was performed according to the departmental dose-optimization program which includes automated exposure control, adjustment of the mA and/or kV according to patient size and/or use of iterative reconstruction technique. COMPARISON:  Chest CT 11/18/2023 and 05/08/2023. FINDINGS: Cardiovascular: Atherosclerosis of the aorta, great vessels and coronary arteries. Right subclavian Port-A-Cath terminates in the upper SVC. Probable calcifications of the aortic valve. The heart size is normal. There is no pericardial effusion. Mediastinum/Nodes: There are no enlarged mediastinal, hilar or axillary lymph nodes.Hilar assessment is limited by the lack of intravenous contrast, although the hilar contours appear unchanged. The thyroid  gland, trachea and esophagus demonstrate no significant findings. Lungs/Pleura: No pleural effusion or pneumothorax. Stable postsurgical findings from previous left upper lobe resection. There is scattered subpleural reticulation in both lungs. New bandlike opacity posteriorly in the right middle lobe measuring 8 x 2 mm on image 62/3, likely  an area of postinflammatory scarring. Stable linear scarring at both lung bases. No suspicious pulmonary nodules. Upper abdomen: Contour irregularity of the liver again noted suspicious for mild cirrhosis. Status post cholecystectomy without evidence of biliary dilatation. No adrenal mass. Musculoskeletal/Chest wall: There is no chest wall mass or suspicious osseous finding. Mild multilevel spondylosis. IMPRESSION: 1. Stable postsurgical findings from previous left  upper lobe resection. No evidence of local recurrence or metastatic disease. 2. New bandlike opacity posteriorly in the right middle lobe, likely an area of postinflammatory scarring. Recommend attention on follow-up. 3. Stable contour irregularity of the liver suspicious for mild cirrhosis. 4. Coronary and aortic Atherosclerosis (ICD10-I70.0). Electronically Signed   By: Elsie Perone M.D.   On: 05/29/2024 16:58

## 2024-06-08 DIAGNOSIS — R2681 Unsteadiness on feet: Secondary | ICD-10-CM | POA: Diagnosis not present

## 2024-06-08 DIAGNOSIS — R7303 Prediabetes: Secondary | ICD-10-CM | POA: Diagnosis not present

## 2024-06-08 DIAGNOSIS — J449 Chronic obstructive pulmonary disease, unspecified: Secondary | ICD-10-CM | POA: Diagnosis not present

## 2024-06-08 DIAGNOSIS — F32A Depression, unspecified: Secondary | ICD-10-CM | POA: Diagnosis not present

## 2024-06-08 DIAGNOSIS — Z6833 Body mass index (BMI) 33.0-33.9, adult: Secondary | ICD-10-CM | POA: Diagnosis not present

## 2024-06-16 DIAGNOSIS — M545 Low back pain, unspecified: Secondary | ICD-10-CM | POA: Diagnosis not present

## 2024-06-21 NOTE — Progress Notes (Unsigned)
 Laura Abbott, female    DOB: [birth year]    MRN: [id number]   Brief patient profile:  76  yowf  quit smoking 09/2018  at wt around 200   referred to pulmonary clinic in [facility]  10/25/2022 by Dr antoinette from [facility]  for copd eval s/p admit  Admit date: 10/18/2022 Discharge date: 10/20/2022 Home Health: Equipment/Devices: Nebulizer machine   Discharge Condition: Improved CODE STATUS: Full code Diet recommendation: Heart healthy   Brief/Interim Summary: 78 year old female with a history of COPD, lung cancer, hyperlipidemia, 3 d prior treated for bronchitis with antibiotics and prednisone , had persistent shortness of breath and wheezing and was admitted to the hospital with COPD exacerbation.  Receiving IV steroids and bronchodilators.    Discharge Diagnoses:  Principal Problem:   COPD exacerbation (HCC)   Hypertension   Leukocytosis   Hyperlipidemia   Depression with anxiety   COPD exacerbation -Likely precipitated by recent bronchitis -Completed course of azithromycin  prior to admission -Chest x-ray on admission did not show any pneumonia -Started on intravenous steroids -Overall wheezing is improved -She is able to wean off of oxygen  and is currently ambulating on room air without hypoxia or shortness of breath -Steroids transition to prednisone  taper -She will be continued on nebulizer treatments as needed at home -Outpatient referral to pulmonology has been made   Depression with anxiety -Continue Prozac  and Klonopin    Hyperlipidemia -Continue statin   Hypertension -Continue lisinopril  metoprolol      History of Present Illness  10/25/2022  Pulmonary/ 1st office eval/ Laura Abbott / [facility]  Dyspnea:  Laura Abbott is a challenge/ in and out ok but can't do the whole store Cough:  assoc with choking sensation daytime  only, no excess mucus  Sleep: bed is flat one pillow s resp cc  SABA use: neb this am only  02: none Covid vax x 3  No obvious day to day or daytime  pattern/variability or assoc excess/ purulent sputum or mucus plugs or hemoptysis or cp or chest tightness, subjective wheeze or overt sinus or hb symptoms Rec Plan A = Automatic = Always=    Stiolto  2 puffs each am and budesonide  (pulmicort ) twice daily in the nebulizer  Work on inhaler technique:  Plan B = Backup (to supplement plan A, not to replace it) Only use your albuterol  inhaler as a rescue medication  Plan C = Crisis (instead of Plan B but only if Plan B stops working) - only use your albuterol  nebulizer if you first try Plan B  Stop lisinopril  and start olmesartan  40 mg one daily in its place     12/10/2022  f/u ov/Laura Abbott office/Laura Abbott re: COPD 0/ ? Acei case   maint on stiolto 2 each am   Chief Complaint  Patient presents with   Follow-up    Breathing doing well since last ov; no new concerns   Dyspnea:  [employer] is a lot easier/ uses [facility] parking due to djd back/neck Cough: none  Sleeping: flat bed one pillow  SABA use: none  02: none  Rec   Discharge date:                                 May 06, 2024 Length of stay:  LOS: 2 days    Discharge Service:                           [facility] Discharge Attending Physician:     Laura Marice Hurst, PA Discharge to:                                     To Home Condition at Discharge:                   good     Mental Status On day of Discharge:  The patient is Alert and oriented to PERSON The patient is Alert And oriented to TIME The patient is Alert and oriented to [facility] course based on timeline of significant events after admission (by date): 05/04/24 Admitted with multifocal PNA, oxygen  requirement for IV antibx and IV steroids. Also abnormal UA, UTI.  History of left lung CA with resection.      ______________________________________     Admission HPI    Patient admitted on: 05/04/2024  7:03 PM  Patient admitted by: Laura Roger, DO  CHIEF COMPLAINT:        Patient presents with   Shortness of Breath      Sob since yesterday.  Productive cough and runny nose      Day of admission HPI:   May 04, 2024 11:28 PM    Patient admitted on Home O2? - No Patient on home anticoagulant? -  No Patient admitted with Chronic home foley catheter? - No Foley catheter placed or replaced by another service prior to admission? - No   Mental Status on Admission: The patient is Alert and oriented to PERSON The patient is Alert And oriented to TIME The patient is Alert and oriented to LOCATION   This is a 78 y.o. y.o. female with a known history of COPD, lung ca s/p partial resection of L lung HTN, HLD, OA/chronic pain, OAB presents to the emergency department for evaluation of  SOB that began three days ago followed by cough productive of yellow sputum and fatigue.  Symptom have progressively worsened and have been refractory to home nebulizers and Tylenol .  She does have a granddaughter with similar symptoms.  In addition she reports urinary frequency which is not unusual 2/2 OAB.     Medical admission was requested for further workup and management of sepsis 2/2 multifocal pneumonia, UTI.        Problem List, Assessment & Plan     ASSESSMENT & PLAN (In order of descending acuity)   78 y.o. y.o. female with a known history of COPD, lung ca s/p partial resection of L lung HTN, HLD, OA/chronic pain, OAB now admitted with    Multifocal pneumonia, hypoxia, dyspnea, SIRS Sepsis ruled out, patient meets SIRS criteria with elevated lactate, elevated white count (23.9), tachycardia, fever 3 L of oxygen  currently; qualified for 3 L on exertion today Continue IV antibiotics Rocephin  and azithromycin  Continue IV steroids twice daily due to profuse wheezing Continue nebulizers, Acapella and incentive spirometer   COPD exacerbation Oxygen , nebs, antibiotics, steroids as above   Hypertension Resume hydrochlorothiazide , metoprolol , olmesartan  Blood pressure  initially high but comes down nicely with medication Continue to monitor   Hyperlipidemia Resume rovuastation   GERD Continue PPI   Chronic pain Continue gabapentin    Anticoagulation; Lovenox  Oxygen ; 3 L IV  fluids; stopped IV antibiotics; Rocephin  and ceftriaxone    ADDITIONAL PATIENT FINDINGS OR OBSERVATIONS   Physical exam General: No acute distress HEENT [place]/AT; moist mucous membrane Heart: Heart rate = 70s to 90s: Rhythm is =   regular Lungs: Mild wheezing.  No accessory muscle use.  No rales.  Speaking in full sentences.  Has been using 2 to 3 L of O2.  Has qualified for home O2. Abdomen: Soft nontender nondistended Extremities: No peripheral edema Skin: Dry no rash Neuropsych: Within normal limits; alert and oriented x 3.  No neurological deficits.   Discharge plan: Needs appointment with PCP in the next 3 to 5 days for recheck of her multifocal pneumonia; also needs pulmonology appointment in the next 1 to 2 weeks for after visit continuity of care as well as history of lung cancer with lung resection    DVT prophylaxis while in hospital:             enoxaparin     _____________________________________   Vital Signs: BP 154/58   Pulse 74   Temp 36.6 C (97.9 F) (Oral)   Resp 18   Ht 157.5 cm (5' 2)   Wt 90.9 kg (200 lb 4.8 oz)   SpO2 96%   BMI 36.64 kg/m    Nutrition:                                                    06/22/2024  post hosp f/u ov/Laura Abbott office/Laura Abbott re: *** maint on ***  No chief complaint on file.   Dyspnea:  *** Cough: *** Sleeping: ***   resp cc  SABA use: *** 02: ***  Lung cancer screening: ***   No obvious day to day or daytime variability or assoc excess/ purulent sputum or mucus plugs or hemoptysis or cp or chest tightness, subjective wheeze or overt sinus or hb symptoms.    Also denies any obvious fluctuation of symptoms with weather or environmental changes or other aggravating or alleviating factors except as  outlined above   No unusual exposure hx or h/o childhood pna/ asthma or knowledge of premature birth.  Current Allergies, Complete Past Medical History, Past Surgical History, Family History, and Social History were reviewed in [employer] record.  ROS  The following are not active complaints unless bolded Hoarseness, sore throat, dysphagia, dental problems, itching, sneezing,  nasal congestion or discharge of excess mucus or purulent secretions, ear ache,   fever, chills, sweats, unintended wt loss or wt gain, classically pleuritic or exertional cp,  orthopnea pnd or arm/hand swelling  or leg swelling, presyncope, palpitations, abdominal pain, anorexia, nausea, vomiting, diarrhea  or change in bowel habits or change in bladder habits, change in stools or change in urine, dysuria, hematuria,  rash, arthralgias, visual complaints, headache, numbness, weakness or ataxia or problems with walking or coordination,  change in mood or  memory.        No outpatient medications have been marked as taking for the 06/22/24 encounter (Appointment) with Laura Ozell NOVAK, MD.            Past Medical History:  Diagnosis Date   Anxiety    Arthritis    Asthma    Breast cancer (HCC)    breast, right DCIS   COPD (chronic obstructive pulmonary disease) (HCC)    COPD exacerbation (HCC)  01/03/2014   With hypoxia   Depression    Emphysema of lung (HCC)    Family history of breast cancer    Family history of colon cancer    Family history of kidney cancer    Family history of lung cancer    Family history of pancreatic cancer    GERD (gastroesophageal reflux disease)    History of kidney stones    Hyperlipidemia    Hypertension    Insomnia    Lung cancer (HCC)    Obesity 01/05/2014   Osteoporosis    Osteoporosis, unspecified 12/01/2013   Personal history of radiation therapy    rt breast   Pneumonia    x2   Tobacco abuse 12/01/2013   Ulnar nerve impingement, left         Objective:    Wts   06/22/2024        ***   12/10/22 203 lb 3.2 oz (92.2 kg)  11/07/22 202 lb 6.4 oz (91.8 kg)  10/25/22 198 lb 6.4 oz (90 kg)    Vital signs reviewed  06/22/2024  - Note at rest 02 sats  ***% on ***      General appearance:    ***  Min bar***     Assessment

## 2024-06-22 ENCOUNTER — Ambulatory Visit: Admitting: Internal Medicine

## 2024-06-22 ENCOUNTER — Encounter: Payer: Self-pay | Admitting: Internal Medicine

## 2024-06-22 VITALS — BP 118/52 | HR 73 | Ht 62.0 in | Wt 194.2 lb

## 2024-06-22 DIAGNOSIS — J189 Pneumonia, unspecified organism: Secondary | ICD-10-CM | POA: Diagnosis not present

## 2024-06-22 DIAGNOSIS — J449 Chronic obstructive pulmonary disease, unspecified: Secondary | ICD-10-CM | POA: Diagnosis not present

## 2024-06-22 NOTE — Patient Instructions (Signed)
 Make sure you check your oxygen  saturation at your highest level of activity(NOT after you stop)  to be sure it stays over 90% and keep track of it at least once a week, more often if breathing getting worse, and let me know if losing ground. (Collect the dots to connect the dots approach)    If you are satisfied with your treatment plan,  let your doctor know and he/she can either refill your medications or you can return here when your prescription runs out.     If in any way you are not 100% satisfied,  please tell us .  If 100% better, tell your friends!  Pulmonary follow up is as needed

## 2024-06-22 NOTE — Assessment & Plan Note (Signed)
 Stopped smoking 2019 - PFT's  12/10/18  FEV1 1.40 (68 % ) ratio 0.72  p 3 % improvement from saba p Breo prior to study with DLCO  12.12 (65%)   and FV curve min concave and ERV 20 at wt 203    - 10/25/2022  After extensive coaching inhaler device,  effectiveness =    75% smi (short ti)  - try off acei 10/25/2022 > much improved 12/10/2022   Pt is Group B in terms of symptom/risk and laba/lama therefore appropriate rx at this point >>>  stiolot 2 each am and f/u can be prn or for refills yearly   I did advise: Make sure you check your oxygen  saturation at your highest level of activity(NOT after you stop)  to be sure it stays over 90% and keep track of it at least once a week, more often if breathing getting worse, and let me know if losing ground. (Collect the dots to connect the dots approach)

## 2024-06-23 DIAGNOSIS — M546 Pain in thoracic spine: Secondary | ICD-10-CM | POA: Diagnosis not present

## 2024-07-08 DIAGNOSIS — M6281 Muscle weakness (generalized): Secondary | ICD-10-CM | POA: Diagnosis not present

## 2024-07-15 DIAGNOSIS — M6281 Muscle weakness (generalized): Secondary | ICD-10-CM | POA: Diagnosis not present

## 2024-07-20 DIAGNOSIS — Z0001 Encounter for general adult medical examination with abnormal findings: Secondary | ICD-10-CM | POA: Diagnosis not present

## 2024-07-20 DIAGNOSIS — D72829 Elevated white blood cell count, unspecified: Secondary | ICD-10-CM | POA: Diagnosis not present

## 2024-07-22 DIAGNOSIS — M6281 Muscle weakness (generalized): Secondary | ICD-10-CM | POA: Diagnosis not present

## 2024-07-29 DIAGNOSIS — M6281 Muscle weakness (generalized): Secondary | ICD-10-CM | POA: Diagnosis not present

## 2024-08-12 DIAGNOSIS — M6281 Muscle weakness (generalized): Secondary | ICD-10-CM | POA: Diagnosis not present

## 2024-08-31 DIAGNOSIS — Z23 Encounter for immunization: Secondary | ICD-10-CM | POA: Diagnosis not present

## 2024-08-31 DIAGNOSIS — F32A Depression, unspecified: Secondary | ICD-10-CM | POA: Diagnosis not present

## 2024-08-31 DIAGNOSIS — R7303 Prediabetes: Secondary | ICD-10-CM | POA: Diagnosis not present

## 2024-08-31 DIAGNOSIS — J449 Chronic obstructive pulmonary disease, unspecified: Secondary | ICD-10-CM | POA: Diagnosis not present

## 2024-08-31 DIAGNOSIS — Z6834 Body mass index (BMI) 34.0-34.9, adult: Secondary | ICD-10-CM | POA: Diagnosis not present

## 2024-08-31 DIAGNOSIS — R2681 Unsteadiness on feet: Secondary | ICD-10-CM | POA: Diagnosis not present

## 2024-08-31 DIAGNOSIS — Z0001 Encounter for general adult medical examination with abnormal findings: Secondary | ICD-10-CM | POA: Diagnosis not present

## 2024-08-31 DIAGNOSIS — Z1389 Encounter for screening for other disorder: Secondary | ICD-10-CM | POA: Diagnosis not present

## 2024-08-31 DIAGNOSIS — Z1331 Encounter for screening for depression: Secondary | ICD-10-CM | POA: Diagnosis not present

## 2024-10-05 DIAGNOSIS — Z6834 Body mass index (BMI) 34.0-34.9, adult: Secondary | ICD-10-CM | POA: Diagnosis not present

## 2024-10-05 DIAGNOSIS — Z20828 Contact with and (suspected) exposure to other viral communicable diseases: Secondary | ICD-10-CM | POA: Diagnosis not present

## 2024-10-05 DIAGNOSIS — J441 Chronic obstructive pulmonary disease with (acute) exacerbation: Secondary | ICD-10-CM | POA: Diagnosis not present

## 2024-10-13 ENCOUNTER — Ambulatory Visit: Admitting: Gastroenterology

## 2024-10-15 DIAGNOSIS — H43393 Other vitreous opacities, bilateral: Secondary | ICD-10-CM | POA: Diagnosis not present

## 2025-02-16 ENCOUNTER — Ambulatory Visit: Admitting: Gastroenterology

## 2025-05-25 ENCOUNTER — Other Ambulatory Visit (HOSPITAL_COMMUNITY)

## 2025-05-25 ENCOUNTER — Other Ambulatory Visit

## 2025-06-01 ENCOUNTER — Other Ambulatory Visit

## 2025-06-08 ENCOUNTER — Ambulatory Visit: Admitting: Oncology
